# Patient Record
Sex: Female | Born: 1955
Health system: Southern US, Community
[De-identification: ages and names within clinical notes are randomized; demographics above are authoritative.]

## PROBLEM LIST (undated history)

## (undated) DIAGNOSIS — K5909 Other constipation: Secondary | ICD-10-CM

## (undated) DIAGNOSIS — F418 Other specified anxiety disorders: Secondary | ICD-10-CM

## (undated) DIAGNOSIS — I10 Essential (primary) hypertension: Secondary | ICD-10-CM

## (undated) DIAGNOSIS — K5790 Diverticulosis of intestine, part unspecified, without perforation or abscess without bleeding: Secondary | ICD-10-CM

## (undated) DIAGNOSIS — K219 Gastro-esophageal reflux disease without esophagitis: Secondary | ICD-10-CM

## (undated) DIAGNOSIS — E119 Type 2 diabetes mellitus without complications: Secondary | ICD-10-CM

## (undated) DIAGNOSIS — E663 Overweight: Secondary | ICD-10-CM

## (undated) DIAGNOSIS — K279 Peptic ulcer, site unspecified, unspecified as acute or chronic, without hemorrhage or perforation: Secondary | ICD-10-CM

## (undated) DIAGNOSIS — M069 Rheumatoid arthritis, unspecified: Secondary | ICD-10-CM

## (undated) DIAGNOSIS — G44009 Cluster headache syndrome, unspecified, not intractable: Secondary | ICD-10-CM

## (undated) DIAGNOSIS — N183 Chronic kidney disease, stage 3 unspecified: Secondary | ICD-10-CM

## (undated) DIAGNOSIS — G43909 Migraine, unspecified, not intractable, without status migrainosus: Secondary | ICD-10-CM

## (undated) DIAGNOSIS — M8580 Other specified disorders of bone density and structure, unspecified site: Secondary | ICD-10-CM

## (undated) DIAGNOSIS — K635 Polyp of colon: Secondary | ICD-10-CM

## (undated) HISTORY — DX: Diverticulosis of intestine, part unspecified, without perforation or abscess without bleeding: K57.90

## (undated) HISTORY — DX: Other specified disorders of bone density and structure, unspecified site: M85.80

## (undated) HISTORY — DX: Polyp of colon: K63.5

## (undated) HISTORY — DX: Chronic kidney disease, stage 3 unspecified: N18.30

## (undated) HISTORY — DX: Type 2 diabetes mellitus without complications: E11.9

## (undated) HISTORY — DX: Migraine, unspecified, not intractable, without status migrainosus: G43.909

## (undated) HISTORY — DX: Essential (primary) hypertension: I10

## (undated) HISTORY — DX: Overweight: E66.3

## (undated) HISTORY — DX: Other constipation: K59.09

## (undated) HISTORY — PX: ABDOMINAL HYSTERECTOMY: SHX81

## (undated) HISTORY — DX: Other specified anxiety disorders: F41.8

## (undated) HISTORY — DX: Gastro-esophageal reflux disease without esophagitis: K21.9

## (undated) HISTORY — PX: APPENDECTOMY: SHX54

## (undated) HISTORY — DX: Cluster headache syndrome, unspecified, not intractable: G44.009

## (undated) HISTORY — PX: OTHER SURGICAL HISTORY: SHX169

## (undated) HISTORY — DX: Rheumatoid arthritis, unspecified: M06.9

## (undated) HISTORY — DX: Peptic ulcer, site unspecified, unspecified as acute or chronic, without hemorrhage or perforation: K27.9

## (undated) HISTORY — PX: CHOLECYSTECTOMY: SHX55

## (undated) HISTORY — PX: BACK SURGERY: SHX140

---

## 2007-02-09 ENCOUNTER — Encounter: Admission: RE | Admit: 2007-02-09 | Discharge: 2007-02-09 | Payer: Self-pay | Admitting: Family Medicine

## 2007-03-08 ENCOUNTER — Ambulatory Visit: Payer: Self-pay | Admitting: Gastroenterology

## 2007-03-08 LAB — CONVERTED CEMR LAB
Basophils Absolute: 0 10*3/uL (ref 0.0–0.1)
Basophils Relative: 0.6 % (ref 0.0–1.0)
Eosinophils Absolute: 0.2 10*3/uL (ref 0.0–0.6)
Eosinophils Relative: 5.3 % — ABNORMAL HIGH (ref 0.0–5.0)
HCT: 33.3 % — ABNORMAL LOW (ref 36.0–46.0)
Hemoglobin: 11.2 g/dL — ABNORMAL LOW (ref 12.0–15.0)
INR: 0.9 (ref 0.8–1.0)
Lymphocytes Relative: 39.2 % (ref 12.0–46.0)
MCHC: 33.5 g/dL (ref 30.0–36.0)
MCV: 88.2 fL (ref 78.0–100.0)
Monocytes Absolute: 0.3 10*3/uL (ref 0.2–0.7)
Monocytes Relative: 6.4 % (ref 3.0–11.0)
Neutro Abs: 2 10*3/uL (ref 1.4–7.7)
Neutrophils Relative %: 48.5 % (ref 43.0–77.0)
Platelets: 331 10*3/uL (ref 150–400)
Prothrombin Time: 11.5 s (ref 10.9–13.3)
RBC: 3.78 M/uL — ABNORMAL LOW (ref 3.87–5.11)
RDW: 22.5 % — ABNORMAL HIGH (ref 11.5–14.6)
WBC: 4.1 10*3/uL — ABNORMAL LOW (ref 4.5–10.5)
aPTT: 26.1 s (ref 21.7–29.8)

## 2007-03-23 HISTORY — PX: LIVER BIOPSY: SHX301

## 2007-03-29 ENCOUNTER — Encounter: Payer: Self-pay | Admitting: Gastroenterology

## 2007-03-29 ENCOUNTER — Ambulatory Visit (HOSPITAL_COMMUNITY): Admission: RE | Admit: 2007-03-29 | Discharge: 2007-03-29 | Payer: Self-pay | Admitting: Gastroenterology

## 2007-03-29 ENCOUNTER — Encounter (INDEPENDENT_AMBULATORY_CARE_PROVIDER_SITE_OTHER): Payer: Self-pay | Admitting: *Deleted

## 2007-03-31 ENCOUNTER — Ambulatory Visit: Payer: Self-pay | Admitting: Gastroenterology

## 2007-04-05 ENCOUNTER — Ambulatory Visit: Payer: Self-pay | Admitting: Gastroenterology

## 2007-09-29 ENCOUNTER — Telehealth (INDEPENDENT_AMBULATORY_CARE_PROVIDER_SITE_OTHER): Payer: Self-pay | Admitting: *Deleted

## 2007-10-03 ENCOUNTER — Ambulatory Visit: Payer: Self-pay | Admitting: Gastroenterology

## 2007-10-03 LAB — CONVERTED CEMR LAB
ALT: 30 units/L (ref 0–35)
AST: 26 units/L (ref 0–37)
Albumin: 3.4 g/dL — ABNORMAL LOW (ref 3.5–5.2)
Alkaline Phosphatase: 103 units/L (ref 39–117)
Bilirubin, Direct: 0.1 mg/dL (ref 0.0–0.3)
HCV Ab: NEGATIVE
Hep B S Ab: POSITIVE — AB
Hepatitis B Surface Ag: NEGATIVE
Total Bilirubin: 0.6 mg/dL (ref 0.3–1.2)
Total Protein: 6.8 g/dL (ref 6.0–8.3)

## 2007-10-05 ENCOUNTER — Telehealth: Payer: Self-pay | Admitting: Gastroenterology

## 2007-10-31 ENCOUNTER — Emergency Department (HOSPITAL_COMMUNITY): Admission: EM | Admit: 2007-10-31 | Discharge: 2007-11-01 | Payer: Self-pay | Admitting: Emergency Medicine

## 2008-02-19 ENCOUNTER — Encounter: Payer: Self-pay | Admitting: Gastroenterology

## 2008-02-20 ENCOUNTER — Telehealth: Payer: Self-pay | Admitting: Gastroenterology

## 2008-02-22 ENCOUNTER — Ambulatory Visit: Payer: Self-pay | Admitting: Gastroenterology

## 2008-02-22 DIAGNOSIS — I10 Essential (primary) hypertension: Secondary | ICD-10-CM | POA: Insufficient documentation

## 2008-02-22 DIAGNOSIS — K589 Irritable bowel syndrome without diarrhea: Secondary | ICD-10-CM | POA: Insufficient documentation

## 2008-02-22 DIAGNOSIS — K625 Hemorrhage of anus and rectum: Secondary | ICD-10-CM | POA: Insufficient documentation

## 2008-02-22 DIAGNOSIS — M069 Rheumatoid arthritis, unspecified: Secondary | ICD-10-CM | POA: Insufficient documentation

## 2008-02-22 DIAGNOSIS — F418 Other specified anxiety disorders: Secondary | ICD-10-CM | POA: Insufficient documentation

## 2008-02-22 DIAGNOSIS — R1013 Epigastric pain: Secondary | ICD-10-CM | POA: Insufficient documentation

## 2008-02-22 HISTORY — DX: Other specified anxiety disorders: F41.8

## 2008-04-02 ENCOUNTER — Ambulatory Visit: Payer: Self-pay | Admitting: Gastroenterology

## 2008-04-23 ENCOUNTER — Ambulatory Visit: Payer: Self-pay | Admitting: Gastroenterology

## 2008-04-23 ENCOUNTER — Encounter: Payer: Self-pay | Admitting: Gastroenterology

## 2008-04-23 DIAGNOSIS — K279 Peptic ulcer, site unspecified, unspecified as acute or chronic, without hemorrhage or perforation: Secondary | ICD-10-CM

## 2008-04-23 HISTORY — PX: COLONOSCOPY: SHX174

## 2008-04-23 HISTORY — DX: Peptic ulcer, site unspecified, unspecified as acute or chronic, without hemorrhage or perforation: K27.9

## 2008-04-24 ENCOUNTER — Telehealth: Payer: Self-pay | Admitting: Gastroenterology

## 2008-04-30 ENCOUNTER — Encounter: Payer: Self-pay | Admitting: Gastroenterology

## 2008-06-12 ENCOUNTER — Telehealth: Payer: Self-pay | Admitting: Gastroenterology

## 2008-08-02 ENCOUNTER — Telehealth: Payer: Self-pay | Admitting: Gastroenterology

## 2010-04-21 NOTE — Progress Notes (Signed)
Summary: REctal Pain  Phone Note Call from Patient Call back at [phone] cell   Call For: DR Arlyce Dice Reason for Call: Talk to Nurse Summary of Call: Rectal bleeding and pain. Wants to get scheduled for [facility] asap Initial call taken by: Leanor Kail [facility],  February 20, 2008 11:42 AM  Follow-up for Phone Call        Spoke with patient c/o rectal pain with bleeding,  she has hx of IBS, constipation with diarrhea.  Does have a history of hemrroids also did have some banding done also.  Patient also wanted to set up appointment for EGD and Colonoscopy. Pt given appointment on 02/22/08 at 3pm.  Chart requested send to Zella Ball, instructed to bring insurance card copay and updated list of medications.  Will call with any more issues or concerns. Follow-up by: Paulene Floor, RN,  February 20, 2008 11:59 AM

## 2010-04-21 NOTE — Procedures (Signed)
Summary: EGD   EGD  Procedure date:  04/23/2008  Findings:      Location: [facility]    ENDOSCOPY PROCEDURE REPORT  PATIENT:  Emma [name], Emma [name]  MR#:  [id number] BIRTHDATE:   [birth year]   GENDER:   female  ENDOSCOPIST:   Barbette Hair. Arlyce Dice, MD Referred by: Herb Grays, M.D.  PROCEDURE DATE:  04/23/2008 PROCEDURE:  EGD with biopsy ASA CLASS:   Class II INDICATIONS: abdominal pain   MEDICATIONS:    There was residual sedation effect present from prior procedure., Fentanyl 25 mcg, Versed 1 mg, glycopyrrolate (Robinal) 0.2, 0.6cc simethancone 0.6 cc PO TOPICAL ANESTHETIC:   Exactacain Spray  DESCRIPTION OF PROCEDURE:   After the risks benefits and alternatives of the procedure were thoroughly explained, informed consent was obtained.  The LB GIF-H180 [id number] endoscope was introduced through the mouth and advanced to the third portion of the duodenum, without limitations.  The instrument was slowly withdrawn as the mucosa was fully examined. <<PROCEDUREIMAGES>>  <<OLD IMAGES>>  Multiple ulcers were found in the antrum. 3 discreet 3-86mm prepyloric ulcers with clean base (see image3 and image4). Bxs taken  Otherwise the examination was normal.    Retroflexed views revealed no abnormalities.    The scope was then withdrawn from the patient and the procedure completed.  COMPLICATIONS:   None  ENDOSCOPIC IMPRESSION:  1) Ulcers, multiple in the antrum  2) Otherwise normal examination RECOMMENDATIONS:  1) Protonix 40 mg qd  2) follow-up: office 1 month(s)  REPEAT EXAM:   No   _______________________________ Barbette Hair. Arlyce Dice, MD    CC: Herb Grays, MD     REPORT OF SURGICAL PATHOLOGY   Case #: [id number] Patient Name: Emma [name] Office Chart Number:  [id number]   MRN: [id number] Pathologist: Alden Server A. Delila Spence, MD DOB/Age  12/18/1955 (Age: 55)    Gender: F Date Taken:  04/23/2008 Date Received: 04/24/2008   FINAL DIAGNOSIS   ***MICROSCOPIC EXAMINATION AND  DIAGNOSIS***   STOMACH:  BENIGN GASTRIC MUCOSA.  NO HELICOBACTER PYLORI, INTESTINAL METAPLASIA OR ACTIVE INFLAMMATION IDENTIFIED.   COMMENT A Warthin-Starry stain is performed to determine the possibility of the presence of Helicobacter pylori. The Warthin-Starry stain is negative for organisms of Helicobacter pylori. The control(s) stained appropriately. (EAA:mj 04/25/08)   mj Date Reported:  04/25/2008     Alden Server A. Delila Spence, MD *** Electronically Signed Out By EAA ***   April 30, 2008 MRN: [id number]  RONNELL MAKAREWICZ [address] [place], [place]  [address]  Dear Emma [name],  I am pleased to inform you that the biopsies taken during your recent endoscopic examination did not show any evidence of cancer upon pathologic examination.  Additional information/recommendations:  __No further action is needed at this time.  Please follow-up with      your primary care physician for your other healthcare needs.  __ Please call [phone] to schedule a return visit to review      your condition.  __x Continue with the treatment plan as outlined on the day of your      exam.  __ You should have a repeat endoscopic examination for this problem              in _ months/years.   Please call Emma [name] if you are having persistent problems or have questions about your condition that have not been fully answered at this time.  Sincerely,  Louis Meckel MD  This letter has been electronically signed by your physician.  This  report was created from the original endoscopy report, which was reviewed and signed by the above listed endoscopist.

## 2010-04-21 NOTE — Procedures (Signed)
Summary: Colonoscopy   Colonoscopy  Procedure date:  04/23/2008  Findings:      Location:  [facility].    Procedures Next Due Date:    Colonoscopy: 04/2018  COLONOSCOPY PROCEDURE REPORT  PATIENT:  Emma [name], Emma [name]  MR#:  [id number] BIRTHDATE:   August 26, 1955   GENDER:   female  ENDOSCOPIST:   Barbette Hair. Arlyce Dice, MD Referred by: Herb Grays, M.D.  PROCEDURE DATE:  04/23/2008 PROCEDURE:  Colonoscopy, diagnostic ASA CLASS:   Class II INDICATIONS: rectal bleeding   MEDICATIONS:    Fentanyl 75 mcg, Versed 7 mg, benadryl 25  DESCRIPTION OF PROCEDURE:   After the risks benefits and alternatives of the procedure were thoroughly explained, informed consent was obtained.  Digital rectal exam was performed and revealed no abnormalities.   The LB-CF-[id number] endoscope was introduced through the anus and advanced to the cecum, limited by fair prep.  There was a small amount of retained, solid stool in the right colon  The quality of the prep was good.  The instrument was then slowly withdrawn as the colon was fully examined. <<PROCEDUREIMAGES>>    <<OLD IMAGES>>  FINDINGS:  Mild diverticulosis was found in the sigmoid colon (see image5).  Internal hemorrhoids were found (see image6).  This was otherwise a normal examination (see image3).   Retroflexed views in the rectum revealed no abnormalities.    The scope was then withdrawn from the patient and the procedure completed.  COMPLICATIONS:   None  ENDOSCOPIC IMPRESSION:  1) Mild diverticulosis in the sigmoid colon  2) Internal hemorrhoids  3) Otherwise normal examination  Rectal bleeding secondary to hemorrhoids RECOMMENDATIONS:  1) Anusol HC supp. prn  REPEAT EXAM:   In 10 year(s) for Colonoscopy.   _______________________________ Barbette Hair. Arlyce Dice, MD  CC: Herb Grays, MD

## 2010-04-21 NOTE — Progress Notes (Signed)
Summary: REV scheduled  Phone Note Outgoing Call   Call placed by: Laureen Ochs LPN,  April 24, 2008 10:30 AM Call placed to: Patient Summary of Call: Pt. scheduled for an REV appointment w/Dr.Johnwesley Lederman on 05-22-08 at 2:15pm. Pt. instructed to call back as needed.  Initial call taken by: Laureen Ochs LPN,  April 24, 2008 10:38 AM

## 2010-04-21 NOTE — Progress Notes (Signed)
Summary: LAB RESULTS  Phone Note Call from Patient Call back at Home Phone [phone]   Caller: Patient Call For: Shany Marinez Reason for Call: Talk to Nurse, Lab or Test Results Summary of Call: Would like LAB results Initial call taken by: Guadlupe Spanish [facility],  October 05, 2007 4:57 PM  Follow-up for Phone Call        Mesage left for patient to callback. Laureen Ochs LPN  October 06, 2007 12:13 PM  Message left-labs WNL. Pt. instructed to call back as needed.  Follow-up by: Laureen Ochs LPN,  October 09, 2007 10:08 AM

## 2010-04-21 NOTE — Assessment & Plan Note (Signed)
Summary: rectal pain bleeding/rb   History of Present Illness Visit Type: follow up Primary GI MD: Melvia Heaps MD [facility] Primary Provider: [facility] Chief Complaint: rectal pain and bleeding History of Present Illness:   [name] is  here  for evaluation of abdominal pain and rectal bleeding.  She has a history of rheumatoid arthritis and was seen earlier this year for liver biopsy in conjunction with methotrexate therapy.  Grade 2 fibrosis and inflammatory changes were seen and  her methotrexate was discontinued.  Over the past 2-3 years she has been complaining of upper abdominal discomfort which is worse over the past few weeks.  She experiences postprandial fullness, bloating and generalized discomfort.  Endoscopy several years ago apparently was pertinent for retained food.  She has frequent pyrosis despite taking omeprazole.  In addition, bowels are very erratic.  She has been experiencing hematochezia consisting of bright red blood mixed with the stools.  She has had small amounts of bowel movements consisting only of blood.  She complains of rectal discomfort.  She probably underwent colonoscopy about 3 years ago we are anal fissures were demonstrated.  She has undergone hemorrhoidal surgery twice in the past.    GI Review of Systems    Reports abdominal pain.     Location of  Abdominal pain: upper abdomen.    Denies acid reflux, belching, bloating, chest pain, dysphagia with liquids, dysphagia with solids, heartburn, loss of appetite, nausea, vomiting, vomiting blood, weight loss, and  weight gain.      Reports constipation, diarrhea, rectal bleeding, and  rectal pain.     Denies anal fissure, black tarry stools, change in bowel habit, diverticulosis, fecal incontinence, heme positive stool, hemorrhoids, irritable bowel syndrome, jaundice, light color stool, and  liver problems.     Prior Medications Reviewed Using: Patient Recall  Updated Prior Medication List: CYMBALTA 60 MG  CPEP (DULOXETINE HCL) 1 once daily HYDROCHLOROTHIAZIDE 25 MG TABS (HYDROCHLOROTHIAZIDE) 1/2 tablet by mouth once daily LEFLUNOMIDE 20 MG TABS (LEFLUNOMIDE) 1 once daily LEXAPRO 20 MG TABS (ESCITALOPRAM OXALATE) 1 once daily LISINOPRIL 20 MG TABS (LISINOPRIL) 1 once daily OMEPRAZOLE 20 MG CPDR (OMEPRAZOLE) one tablet by mouth once daily PLAQUENIL 200 MG TABS (HYDROXYCHLOROQUINE SULFATE) 1 two times a day ALPRAZOLAM 0.5 MG TBDP (ALPRAZOLAM) 1 two times a day as needed CARISOPRODOL 350 MG TABS (CARISOPRODOL) 1-2 at bedtime FEXOFENADINE HCL 180 MG TABS (FEXOFENADINE HCL) 1 once daily as needed DARVOCET-N 100 100-650 MG TABS (PROPOXYPHENE N-APAP) as needed  Current Allergies (reviewed today): ! CODEINE ! * BEE STINGS  Past Medical History:    Reviewed history from 02/22/2008 and no changes required:       Current Problems:        DEPRESSION (ICD-311)       RHEUMATOID ARTHRITIS (ICD-714.0)       HYPERTENSION (ICD-401.9)         Past Surgical History:    Reviewed history from 02/22/2008 and no changes required:       Appendectomy       Cholecystectomy       Hemorrhoidectomy       Hysterectomy   Family History:    Reviewed history and no changes required:       Family History of Breast Cancer:breast       Family History of Pancreatic Cancer:father       father-skin cancer       Family History of Colon Cancer:  Social History:    Reviewed history  and no changes required:       Patient has never smoked.        Alcohol Use - no       Daily Caffeine Use   Risk Factors:  Tobacco use:  never Alcohol use:  no   Review of Systems       The patient complains of arthritis/joint pain and sleeping problems.         With the exception of the above stipulations, review of systems is otherwise negative    Vital Signs:  Patient Profile:   55 Years Old Female Height:     68 inches Weight:      230 pounds BMI:     35.10 BSA:     2.17 Pulse rate:   84 / minute Pulse rhythm:    regular BP sitting:   150 / 92  (left arm)  Vitals Entered By: Milford Cage CMA (February 22, 2008 2:51 PM)                  Physical Exam  She is a well-developed well-nourished female  skin: anicteric HEENT: normocephalic; PEERLA; no nasal or pharyngeal abnormalities neck: supple nodes: no cervical lymphadenopathy chest: clear to ausculatation and percussion heart: no murmurs, gallops, or rubs abd: soft, nontender; BS normoactive; no abdominal masses, tenderness, organomegaly rectal: deferred ext: no cynanosis, clubbing, edema skeletal: no deformities neuro: oriented x 3; no focal abnormalities     Impression & Recommendations:  Problem # 1:  ABDOMINAL PAIN, EPIGASTRIC (ICD-789.06) Pain could be due to ulcer or nonulcer dyspepsia.  Gastroparesis is also a concern.  Recommendations #1 continue omeprazole #2 upper endoscopy #3 to consider gastric imaging scan if endoscopy is not diagnostic  Risks, alternatives, and complications of the procedure, including bleeding, perforation, and possible need for surgery, were explained to the patient.  Patient's questions were answered.  Orders: EFL (Endo/Flex)   Problem # 2:  HEMORRHAGE OF RECTUM AND ANUS (ICD-569.3) Patient most likely is bleeding from hemorrhoids or possibly an anal fissure  Recommendations #1 ProctoFoam  (patient refuses to use a suy and ) #2 fiber supplementation #3 sigmoidoscopy-to be done at the same time as upper endoscopy Orders: EFL (Endo/Flex)    Patient Instructions: 1)  Warm soaks daily 2)  Stool softeners as needed  3)  Avoid spicy foods  4)  CC Dr. Collins Scotland 5)  ProctoFoam  (patient refuses to use a suy and ) 6)  fiber supplementation 7)  sigmoidoscopy-to be done at the same time as upper endoscopy 8)  continue omeprazole    Prescriptions: PROCTOFOAM HC 1-1 % FOAM (HYDROCORTISONE ACE-PRAMOXINE) Applied per rectum before retiring  #7 x 2   Entered and Authorized by:   Louis Meckel MD   Signed by:   Louis Meckel MD on 02/22/2008   Method used:   Electronically to        [facility]* (retail)       [address]       [place], [place]  [id number]       Ph: [phone]       Fax: [phone]   RxID:   [id number]  ]

## 2010-04-21 NOTE — Progress Notes (Signed)
Summary: talk to nurse  Phone Note Call from Patient Call back at Home Phone [phone]   Call For: Dr Arlyce Dice Reason for Call: Talk to Nurse Summary of Call: Wants to know her Colon diagnosis Initial call taken by: Leanor Kail [facility],  Aug 02, 2008 2:42 PM  Follow-up for Phone Call        Spoke with patient. She was asking a lot of questions regarding her procedures and diagnosis .Pt states she is seeing a neurologist and needed to know what to tell him. Pt will call back to get records or if she has further questions Follow-up by: Ulis Rias RN,  Aug 02, 2008 2:49 PM

## 2010-04-21 NOTE — Miscellaneous (Signed)
Summary: Gi PV  Clinical Lists Changes  Observations: Added new observation of ALLERGY REV: Done (04/02/2008 15:55) Pt states she had been signed in since 1540.  However, I was not notified on the computer screen or on my pager that she was here.  She seemed bothered that she had been waiting.  Her appt was scheduled for 1630.  I called her in for her PV at 1630.

## 2010-04-21 NOTE — Procedures (Signed)
Summary: Liver Biopsy   Liver Biopsy  Procedure date:  03/29/2007  Findings:      normal:   Patient Name: Emma [name], Emma [name] MRN: [id number] Procedure Procedures: Percutaneous Liver BiopsyCPT: 47000.  Personnel: Examiner: Barbette Hair. Arlyce Dice, MD.  Indications  Evaluation of: ABnormal LFTs.  History  Current Medications: Patient is not currently taking Coumadin.  Pre-Exam Physical: Performed Mar 29, 2007. Entire physical exam was normal.  This exam is being done to assess current degree of liver injury.  Exam Patient: ASA Classification: II. Tolerance: excellent.  Monitoring: BP and pulse monitoring done. Oximetry used. Vital signs were monitored every 15 mins. time four , then vital signs every 30 mins time two , then vital signs hourly until discharge.  Sedation Meds: Patient assessed and found to be appropriate for moderate (conscious) sedation. Sedation was managed by the Endoscopist. Fentanyl 50 mcg. given IV. Versed 2 mg. given IV.  Fluoroscopy: Fluoroscopy was not used.  Positioning: Patient in supine position  Prep: The site was prepped with Betadine Swabs, using 2 swabs. Sterile drapes were placed. Local anesthesia with 1% xylocaine. A total of 1 passes were made with a Trucut/Gun needle,  Exam Completion: A bandage was placed over procedure site. Patient placed on right side for 60 minutes.  Results  Liver Biopsy Results: Core material was obtained,  length 2 cms  Assessment Normal examination. The procedure was successful.  Events  Unplanned Intervention: No intervention was required.  Unplanned Events: There were no complications. Plans Medication(s): Await pathology.  Patient Education: Patient given standard instructions for: Liver Biopsy.  Scheduling: Office Visit, to [employer]. Arlyce Dice, MD, around Apr 12, 2007.    This report was created from the original endoscopy report, which was reviewed and signed by the above listed endoscopist.    cc.  Tammy Spear,MD     Lottie Rater

## 2010-04-21 NOTE — Letter (Signed)
Summary: Patient [facility] Biopsy Results  [facility]  [address] [place], [place] [address]   Phone: [phone]  Fax: [phone]        April 30, 2008 MRN: [id number]    Emma [name] [address] [place], [place]  [address]    Dear Ms. Beckner,  I am pleased to inform you that the biopsies taken during your recent endoscopic examination did not show any evidence of cancer upon pathologic examination.  Additional information/recommendations:  __No further action is needed at this time.  Please follow-up with      your primary care physician for your other healthcare needs.  __ Please call [phone] to schedule a return visit to review      your condition.  __x Continue with the treatment plan as outlined on the day of your      exam.  __ You should have a repeat endoscopic examination for this problem              in _ months/years.   Please call us if you are having persistent problems or have questions about your condition that have not been fully answered at this time.  Sincerely,  Louis Meckel MD  This letter has been electronically signed by your physician.

## 2010-08-04 NOTE — Assessment & Plan Note (Signed)
[facility]                         GASTROENTEROLOGY OFFICE NOTE   COPELYN, WIDMER                         MRN:          [id number]  DATE:03/08/2007                            DOB:          03-Jun-1955    REASON FOR CONSULTATION:  Abnormal ultrasound.   Emma [name] is a 55 year old white female referred through the courtesy  of Dr. Collins Scotland for evaluation. She has rheumatoid arthritis and complains  of chronic fatigue. An abdominal ultrasound was done on November 20th.  This demonstrated a grainy appearance of the hepatic echotexture and  slightly increased echogenicity raising the question of hepatocellular  disease. Liver tests are normal. Emma [name] has no GI complaints  including abdominal pain, changes in bowel habits, melena or  hematochezia. She takes methotrexate 20 mg weekly. She has been on this  for at least five years. She has no history of hepatitis or jaundice.  She drinks minimally. She apparently was diagnosed with a severe anemia  several years ago requiring transfusions. She claims to have had three  colonoscopies in the last three years.   PAST MEDICAL HISTORY:  1. Hypertension.  2. Rheumatoid arthritis.  3. Status post cholecystectomy.  4. Status post hemorrhoidectomy.  5. Status post hysterectomy.  6. Status post appendectomy.   FAMILY HISTORY:  Pertinent for mother with breast cancer.   MEDICATIONS:  1. Cymbalta.  2. Hydrochlorothiazide.  3. Leflunomide.  4. Lexapro.  5. Lisinopril.  6. Methotrexate.  7. Omeprazole.  8. Plaquenil.  9. Orencia infusion every month.   ALLERGIES:  None.   She does not smoke. She is married and is on disability.   REVIEW OF SYSTEMS:  Was reviewed and was negative.   PHYSICAL EXAMINATION:  Pulse 84, blood pressure 172/94, weight 213.  There is no stigmata of liver disease.  HEENT: EOMI.  PERRLA.  Sclerae are anicteric.  Conjunctivae are pink.  NECK:  Supple without thyromegaly,  adenopathy or carotid bruits.  CHEST:  Clear to auscultation and percussion without adventitious  sounds.  CARDIAC:  Regular rhythm; normal S1 S2.  There are no murmurs, gallops  or rubs.  ABDOMEN:  Bowel sounds are normoactive.  Abdomen is soft, nontender and  nondistended.  There are no abdominal masses, tenderness, splenic  enlargement or hepatomegaly.  EXTREMITIES:  Full range of motion.  No cyanosis, clubbing or edema.  RECTAL:  There are no masses.  Stool is Hemoccult negative.   On February 08, 2007, hemoglobin was 10, hematocrit 31, MCV 83.   IMPRESSION:  1. Questionable hepatocellular disease. The patient certainly is at      increased risk for fibrosis in view of her therapy with      methotrexate. She has had several grams of methotrexate judging by      her current dose.  2. Fatigue, probably related to rheumatoid arthritis.  3. Anemia of chronic disease.   RECOMMENDATIONS:  Percutaneous liver biopsy.     Barbette Hair. Arlyce Dice, MD,FACG  Electronically Signed    RDK/MedQ  DD: 03/08/2007  DT: 03/08/2007  Job #: [id number]   cc:   Tammy  Wendi Snipes, M.D.  Caryn Section, MD

## 2010-08-04 NOTE — Letter (Signed)
March 08, 2007    Tammy R. Collins Scotland, M.D.  [address],  [place] [address]   RE:  Emma [name], Emma [name]  MRN:  [id number]  /  DOB:  [birth year]   Dear Dr. Collins Scotland:   Upon your kind referral, I had the pleasure of evaluating your patient  and I am pleased to offer my findings.  I saw Ms. Haily Caley in the  office today.  Enclosed is a copy of my progress note that details my  findings and recommendations.   Thank you for the opportunity to participate in your patient's care.    Sincerely,      Barbette Hair. Arlyce Dice, MD,FACG  Electronically Signed    RDK/MedQ  DD: 03/08/2007  DT: 03/08/2007  Job #: [id number]

## 2010-08-04 NOTE — Assessment & Plan Note (Signed)
[facility]                         GASTROENTEROLOGY OFFICE NOTE   Emma [name], Emma [name]                         MRN:          [id number]  DATE:04/05/2007                            DOB:          [birth year]    NOTATION:  Please send a copy of the liver biopsy result to both Dr.  Collins Scotland and to Dr. Rogelio Seen.   PROBLEM:  Chronic hepatitis.   HISTORY:  Emma [name] has returned following a percutaneous liver biopsy.  The biopsy demonstrated grade 2 chronic hepatitis with stage 2 fibrosis.  There was mild chronic portal inflammatory with areas of piecemeal  necrosis and rare areas of bridging fibrosis.  The changes are  consistent with methotrexate therapy.   On the basis of the liver biopsy results, I recommend that Emma [name]  discontinue further methotrexate therapy, since it has demonstrated  liver hepatotoxicity.  Fortunately, these changes at this point should  not impair her hepatic function.     Barbette Hair. Arlyce Dice, MD,FACG  Electronically Signed    RDK/MedQ  DD: 04/05/2007  DT: 04/05/2007  Job #: [id number]   cc:   Tammy R. Collins Scotland, M.D.  Caryn Section, M.D.

## 2010-08-04 NOTE — Letter (Signed)
March 08, 2007    Ms. Emma [name]   RE:  [name], [name]  MRN:  [id number]  /  DOB:  06-04-1955   Dear Ms. Goecke:   It is my pleasure to have treated you recently as a new patient in my  office.  I appreciate your confidence and the opportunity to participate  in your care.   Since I do have a busy inpatient endoscopy schedule and office schedule,  my office hours vary weekly.  I am, however, available for emergency  calls every day through my office.  If I cannot promptly meet an urgent  office appointment, another one of our gastroenterologists will be able  to assist you.   My well-trained staff are prepared to help you at all times.  For  emergencies after office hours, a physician from our gastroenterology  section is always available through my 24-hour answering service.   While you are under my care, I encourage discussion of your questions  and concerns, and I will be happy to return your calls as soon as I am  available.   Once again, I welcome you as a new patient and I look forward to a happy  and healthy relationship.    Sincerely,      Barbette Hair. Arlyce Dice, MD,FACG  Electronically Signed   RDK/MedQ  DD: 03/08/2007  DT: 03/08/2007  Job #: [id number]

## 2011-06-10 ENCOUNTER — Other Ambulatory Visit: Payer: Self-pay | Admitting: Neurology

## 2011-06-10 DIAGNOSIS — R519 Headache, unspecified: Secondary | ICD-10-CM

## 2011-06-11 ENCOUNTER — Encounter: Payer: Self-pay | Admitting: Family Medicine

## 2011-06-11 ENCOUNTER — Ambulatory Visit (INDEPENDENT_AMBULATORY_CARE_PROVIDER_SITE_OTHER): Payer: Medicare Other | Admitting: Family Medicine

## 2011-06-11 DIAGNOSIS — K219 Gastro-esophageal reflux disease without esophagitis: Secondary | ICD-10-CM

## 2011-06-11 DIAGNOSIS — IMO0001 Reserved for inherently not codable concepts without codable children: Secondary | ICD-10-CM | POA: Insufficient documentation

## 2011-06-11 DIAGNOSIS — K5900 Constipation, unspecified: Secondary | ICD-10-CM | POA: Insufficient documentation

## 2011-06-11 DIAGNOSIS — D126 Benign neoplasm of colon, unspecified: Secondary | ICD-10-CM

## 2011-06-11 DIAGNOSIS — G43909 Migraine, unspecified, not intractable, without status migrainosus: Secondary | ICD-10-CM

## 2011-06-11 DIAGNOSIS — M069 Rheumatoid arthritis, unspecified: Secondary | ICD-10-CM

## 2011-06-11 DIAGNOSIS — F341 Dysthymic disorder: Secondary | ICD-10-CM

## 2011-06-11 DIAGNOSIS — R079 Chest pain, unspecified: Secondary | ICD-10-CM | POA: Insufficient documentation

## 2011-06-11 DIAGNOSIS — K625 Hemorrhage of anus and rectum: Secondary | ICD-10-CM

## 2011-06-11 DIAGNOSIS — E663 Overweight: Secondary | ICD-10-CM | POA: Insufficient documentation

## 2011-06-11 DIAGNOSIS — T782XXA Anaphylactic shock, unspecified, initial encounter: Secondary | ICD-10-CM

## 2011-06-11 DIAGNOSIS — I10 Essential (primary) hypertension: Secondary | ICD-10-CM

## 2011-06-11 DIAGNOSIS — K635 Polyp of colon: Secondary | ICD-10-CM

## 2011-06-11 DIAGNOSIS — F418 Other specified anxiety disorders: Secondary | ICD-10-CM

## 2011-06-11 HISTORY — DX: Polyp of colon: K63.5

## 2011-06-11 HISTORY — DX: Overweight: E66.3

## 2011-06-11 HISTORY — DX: Gastro-esophageal reflux disease without esophagitis: K21.9

## 2011-06-11 MED ORDER — ABATACEPT 125 MG/ML ~~LOC~~ SOSY: 125.0000 mg | PREFILLED_SYRINGE | SUBCUTANEOUS | Status: DC

## 2011-06-11 MED ORDER — NITROGLYCERIN 0.4 MG SL SUBL: 0.4000 mg | SUBLINGUAL_TABLET | SUBLINGUAL | Status: AC | PRN

## 2011-06-11 MED ORDER — EPINEPHRINE 0.3 MG/0.3ML IJ DEVI: 0.3000 mg | Freq: Once | INTRAMUSCULAR | Status: DC

## 2011-06-11 MED ORDER — ASPIRIN 81 MG PO TBEC: 81.0000 mg | DELAYED_RELEASE_TABLET | Freq: Every day | ORAL | Status: AC

## 2011-06-11 MED ORDER — TOPIRAMATE 50 MG PO TABS: 100.0000 mg | ORAL_TABLET | Freq: Two times a day (BID) | ORAL | Status: DC

## 2011-06-11 NOTE — Progress Notes (Signed)
Patient ID: Emma [name], female   DOB: 12/29/1955, 56 y.o.   MRN: [id number] Royann Wildasin [id number] 26-Jan-1956 06/11/2011      Progress Note New Patient  Subjective  Chief Complaint  Chief Complaint  Patient presents with  . Establish Care    noted fatigue per chronic case of Rhuematoid Arthritis for 13 years-  . Shortness of Breath    noted past 3-4 months as well as heaviness/tightness in  throat  . Headache    notes MRI scheduled from neurologist this upcoming tuesday     HPI  Patient is a 56 year old Caucasian female in today for urgent new patient appt. She reports she had been considering switching PMDs and then recently began having more frequent episodes of chest pain. She reported the last several months having a lot of trouble controlling her blood pressure. She reports both of her doctor's office and at home she's been having blood pressure readings of 200/100. Her primary care doctor has started lisinopril and hydrochlorothiazide. She denies recently neurology she saw a 90s over 60s but she did not feel lightheaded or poorly at that time. She is describing episodes of chest pain occurring roughly every other day. She describes them as chest pressure radiating to the neck and up into the left jaw. She says she feels short of breath nauseous sweaty and feels palpitations at the same time the chest pains occur the pains last a significant amount of time. Sometimes as much as 2 hours. She reports as showing a blood pressure medication she does feel like the frequency of the chest pains in the intensity of the chest pain has lessened somewhat. She does note some intermittent episodes of epistaxis that occurred also when she's had high blood pressure readings. She has ongoing trouble with headaches and does follow with [facility] neurology, Dr. Pearlean Brownie for that. These have not worsened recently.  . She notes she decreased her activity level significantly in the last couple of months trying to  compensate for her pain. She reports episodes can occur at any time. She can be active or resting and can even awaken her at night. She initially denies anxiety but then does acknowledge that she does have a lot of tension and irritability. She has been using alprazolam when necessary with some effect. She denies ever having had a cardiac workup. She reports an episode recently while in a store where she felt severe chest pain, fatigue, palpitations, shortness of breath diaphoresis, nausea and radiation of pain which lasted for quite some time. When asked why she did not present to the ER she was noncommittal but acknowledges she just does not like emergency records.  She has a long history of rheumatoid rhinitis dating back roughly 14 years and has multiple medications on the list for that. She has 2 different steroids she reports she takes alternately. She also has Norco and meloxicam she uses intermittently. She avoids Norco but it does lead to constipation hemorrhoids and bleeding but on occasion muscle breakdown. She uses Maxalt for migraines. She is getting monthly infusions of RNC. Takes Arava as well as hydroxychloroquine for her rheumatoid. She has equal to light on her medications but understands not to take that with meloxicam. She takes Topamax twice daily for her migraines and has recently had her neurologist increasing to 2 tablets twice a day. That occurred just this week and she has not noted a great improvement in her headaches  Past Medical History  Diagnosis Date  .  Rheumatoid arthritis   . Chest pain 06/11/2011  . Overweight 06/11/2011  . Depression with anxiety 02/22/2008    Qualifier: Diagnosis of  By: Creta Levin CMA (AAMA), Robin    . Reflux 06/11/2011  . Colon polyp 06/11/2011  . Constipation 06/11/2011    History reviewed. No pertinent past surgical history.  Family History  Problem Relation Age of Onset  . Diabetes Mother   . Stroke Mother   . Arthritis Father   .  Hypertension Father   . Alcohol abuse Sister   . Cancer Sister     mesothelioma  . Heart disease Sister     cardiomegaly  . Hypertension Sister   . Hypertension Brother   . Polycystic ovary syndrome Daughter   . Heart disease Maternal Grandmother   . Heart disease Maternal Grandfather     History   Social History  . Marital Status: Married    Spouse Name: N/A    Number of Children: N/A  . Years of Education: N/A   Occupational History  . Not on file.   Social History Main Topics  . Smoking status: Never Smoker   . Smokeless tobacco: Not on file  . Alcohol Use: No  . Drug Use: No  . Sexually Active: Yes   Other Topics Concern  . Not on file   Social History Narrative  . No narrative on file    No current outpatient prescriptions on file prior to visit.    Allergies  Allergen Reactions  . Bee Venom Anaphylaxis  . Codeine     REACTION: vomiting    Review of Systems  Review of Systems  Constitutional: Positive for malaise/fatigue. Negative for fever and chills.  HENT: Negative for hearing loss, nosebleeds and congestion.   Eyes: Negative for discharge.  Respiratory: Positive for shortness of breath. Negative for cough, sputum production and wheezing.   Cardiovascular: Positive for chest pain and palpitations. Negative for leg swelling.  Gastrointestinal: Positive for heartburn, nausea and abdominal pain. Negative for vomiting, diarrhea, constipation and blood in stool.  Genitourinary: Negative for dysuria, urgency, frequency and hematuria.  Musculoskeletal: Negative for myalgias, back pain and falls.  Skin: Negative for rash.  Neurological: Negative for dizziness, tremors, sensory change, focal weakness, loss of consciousness, weakness and headaches.  Endo/Heme/Allergies: Negative for polydipsia. Does not bruise/bleed easily.  Psychiatric/Behavioral: Negative for depression and suicidal ideas. The patient is nervous/anxious. The patient does not have  insomnia.     Objective  BP 122/82  Pulse 97  Temp(Src) 97.8 F (36.6 C) (Oral)  Ht 5\' 7"  (1.702 m)  Wt 219 lb 6.4 oz (99.519 kg)  BMI 34.36 kg/m2  SpO2 98%  Physical Exam  Physical Exam  Constitutional: She is oriented to person, place, and time and well-developed, well-nourished, and in no distress. No distress.  HENT:  Head: Normocephalic and atraumatic.  Right Ear: External ear normal.  Left Ear: External ear normal.  Nose: Nose normal.  Mouth/Throat: Oropharynx is clear and moist. No oropharyngeal exudate.  Eyes: Conjunctivae are normal. Pupils are equal, round, and reactive to light. Right eye exhibits no discharge. Left eye exhibits no discharge. No scleral icterus.  Neck: Normal range of motion. Neck supple. No thyromegaly present.  Cardiovascular: Normal rate, regular rhythm, normal heart sounds and intact distal pulses.   No murmur heard.      Lower extremities mild varicosities. No inflammation  Pulmonary/Chest: Effort normal and breath sounds normal. No respiratory distress. She has no wheezes. She has no rales.  Abdominal: Soft. Bowel sounds are normal. She exhibits no distension and no mass. There is no tenderness.  Musculoskeletal: Normal range of motion. She exhibits no edema and no tenderness.  Lymphadenopathy:    She has no cervical adenopathy.  Neurological: She is alert and oriented to person, place, and time. She has normal reflexes. No cranial nerve deficit. Coordination normal.  Skin: Skin is warm and dry. No rash noted. She is not diaphoretic.  Psychiatric: Mood, memory and affect normal.       Assessment & Plan  HEMORRHAGE OF RECTUM AND ANUS S/p 2 hemorrhoidectomies, no recent difficulties  Reflux Uses Omeprazole prn, reports she has had an upper endoscopy and colonoscopy in past couple of years but her gastroenterologist has retired, will need referral in the future.  Colon polyp Benign per patient will get old records  HYPERTENSION Well  controlled today but patient reporting numbers of 200/100 and higher in the past couple of months but then had a low reading of 90s /60s at her neurologists the other day. Will not change meds based on her numbers today  Depression with anxiety Admits to being very type A and anxious at times. May use her Alprazolam prn while we rule out cardiac as a cause of her pain  Rheumatoid arthritis Patient takes her pain meds infrequently, alternates prednisone and Methylpresdnisolone but has not taken much later and understands not to take them together. Takes the Meloxicam frequently and takes the Norco infrequently due to trouble with constipation  Chest pain Describes Chest pressure with SOB, radiation of pain to left jaw, diaphoresis and nausea. No episodes in the past couple days. Is asked to take an aspirin daily and given SL NTG to use prn, she is advised to present to the ER for further evaluation if her cp does not resolve with NTG use, is referred to cardiology for evaluation and she is advised to take it easy until her cardiac work up is completed.  Constipation Well controlled at present, worse when she is taking Norco frequently

## 2011-06-11 NOTE — Assessment & Plan Note (Signed)
Patient takes her pain meds infrequently, alternates prednisone and Methylpresdnisolone but has not taken much later and understands not to take them together. Takes the Meloxicam frequently and takes the Norco infrequently due to trouble with constipation

## 2011-06-11 NOTE — Assessment & Plan Note (Signed)
Well controlled today but patient reporting numbers of 200/100 and higher in the past couple of months but then had a low reading of 90s /60s at her neurologists the other day. Will not change meds based on her numbers today

## 2011-06-11 NOTE — Assessment & Plan Note (Signed)
Admits to being very type A and anxious at times. May use her Alprazolam prn while we rule out cardiac as a cause of her pain

## 2011-06-11 NOTE — Assessment & Plan Note (Signed)
Describes Chest pressure with SOB, radiation of pain to left jaw, diaphoresis and nausea. No episodes in the past couple days. Is asked to take an aspirin daily and given SL NTG to use prn, she is advised to present to the ER for further evaluation if her cp does not resolve with NTG use, is referred to cardiology for evaluation and she is advised to take it easy until her cardiac work up is completed.

## 2011-06-11 NOTE — Assessment & Plan Note (Signed)
S/p 2 hemorrhoidectomies, no recent difficulties

## 2011-06-11 NOTE — Assessment & Plan Note (Signed)
Benign per patient will get old records

## 2011-06-11 NOTE — Assessment & Plan Note (Signed)
Well controlled at present, worse when she is taking Norco frequently

## 2011-06-11 NOTE — Patient Instructions (Signed)
Angina  Angina is discomfort caused by inadequate oxygen delivery to the heart muscle. Angina is a response to blockage or narrowing of the coronary arteries. It alerts you about a blood flow problem to your heart. New, more frequent, or severe angina is a warning signal for you to seek medical care right away.   CAUSES  The coronary arteries supply blood and oxygen to the heart muscle. The heart muscle needs a constant supply of oxygen in order to pump blood to the body. These arteries often become blocked by hardened deposits of blood products (plaque) including fat and cholesterol. The following contribute to the formation of plaque:   High cholesterol.   High blood pressure.   Smoking.   Obesity.   Diabetes.  SYMPTOMS    The most common problem is a deep discomfort in the center of your chest. The discomfort may also be felt in, or move to your:   Arms (especially the left one).   Throat.   Jaw.   Back.   Upper stomach.   Angina may be brought on by exercise, emotional upset, heavy meals, or extremes of heat or cold.   It may resolve within 5 to 10 minutes after a period of rest.   Angina symptoms vary from person to person.   If you have angina and it is treated, it may resolve. If you feel it again, the symptoms may not be the same in both type and location.  DIAGNOSIS    Emergency room evaluation or hospital admission may be needed to determine if there are any blockages of your coronary arteries.   Blood tests, EKGs, and chest X-rays may be done.   Further testing may include a stress test or an angiogram.   A heart specialist (cardiologist) may be asked to assist with your evaluation.   Other conditions that may feel like angina, but are not a problem with the heart include:   Muscle strain in the chest wall.   Blood clots in the lung.   Anxiety.   Acid reflux from the stomach.  RISKS AND COMPLICATIONS   Angina that is not treated or evaluated can lead to a decline in oxygen delivery  to the heart muscle, a heart attack, or even death.  TREATMENT   Depending on severity of the blockages and on other factors, angina may be treated with:   Lifestyle changes such as weight loss, stopping smoking, appropriate exercise, or low cholesterol and low salt diet.   Medications to control or to treat the risk factors for angina.   Procedures such as angioplasty or stent placement may allow the blockages to be opened without surgery.   Open heart surgery may be needed to bypass blocked arteries that cannot be treated by other methods.  HOME CARE INSTRUCTIONS    If your caregiver prescribed medication to control your angina, take them as directed. Report side effects. Do not stop medications or adjust the dosages on your own.   Regular exercise is good for you as long as it does not cause discomfort. Avoid activities that trigger attacks of angina. Walking is the best exercise. Do not begin any new type of exercise until you check with your caregiver.   You may still have a sexual relationship if it does not cause angina. Tell your caregiver if it does.   Stop smoking. Do not use nicotine patches or gum until you check with your caregiver.   If you are overweight, you should lose   weight. Eat a heart healthy diet that is low in fat and salt.   If your caregiver has given you a follow-up appointment, it is very important to keep that appointment. Not keeping the appointment could result in a heart attack, permanent heart damage, and disability. If there is any problem keeping the appointment, you must call back to this facility for assistance.  SEEK MEDICAL CARE IF:    Your angina seems to be occurring more frequently or seems to be lasting longer.   You are having problems that you think may be side effects of the medicine you are taking.  SEEK IMMEDIATE MEDICAL CARE IF:    You have severe chest discomfort, especially if the pain is crushing or pressure-like and spreads to the arms, back, neck, or  jaw.   You are sweating, feel sick to your stomach (nauseous), or have shortness of breath.   You have an attack of angina that does not get better after rest or taking your usual medicine.   You wake from sleep with chest pain.   You feel dizzy, faint, or experience extreme fatigue.   You have chest pain not typical of your usual angina. THIS IS AN EMERGENCY. Do not wait to see if the pain will go away. Get medical help at once. Call 911. DO NOT drive yourself to the hospital.  MAKE SURE YOU:    Understand these instructions.   Will watch your condition.   Will get help right away if you are not doing well or get worse.  Document Released: 03/08/2005 Document Revised: 02/25/2011 Document Reviewed: 10/10/2007  [employer] Patient Information 2012 [employer].

## 2011-06-11 NOTE — Assessment & Plan Note (Signed)
Uses Omeprazole prn, reports she has had an upper endoscopy and colonoscopy in past couple of years but her gastroenterologist has retired, will need referral in the future.

## 2011-06-15 ENCOUNTER — Ambulatory Visit
Admission: RE | Admit: 2011-06-15 | Discharge: 2011-06-15 | Disposition: A | Discharge status: Home / Self Care | Payer: Medicare Other | Source: Ambulatory Visit | Attending: Neurology | Admitting: Neurology

## 2011-06-15 DIAGNOSIS — R519 Headache, unspecified: Secondary | ICD-10-CM

## 2011-06-15 MED ORDER — GADOBENATE DIMEGLUMINE 529 MG/ML IV SOLN
20.0000 mL | Freq: Once | INTRAVENOUS | Status: AC | PRN
  Administered 2011-06-15: 20 mL via INTRAVENOUS

## 2011-06-16 ENCOUNTER — Encounter: Payer: Self-pay | Admitting: Cardiology

## 2011-06-16 ENCOUNTER — Ambulatory Visit (INDEPENDENT_AMBULATORY_CARE_PROVIDER_SITE_OTHER): Payer: Medicare Other | Admitting: Cardiology

## 2011-06-16 DIAGNOSIS — R079 Chest pain, unspecified: Secondary | ICD-10-CM

## 2011-06-16 DIAGNOSIS — I10 Essential (primary) hypertension: Secondary | ICD-10-CM

## 2011-06-16 NOTE — Progress Notes (Signed)
HPI: 56 yo female with no prior cardiac history for evaluation of chest pain. Patient states that for the past one month she has had intermittent chest pain. The pain is in the upper chest and described as a pressure. It can radiate to her neck and back. There is associated nausea, shortness of breath and diaphoresis. It lasts anywhere from 15 minutes to one hour. It can increase with moving her neck. It is not exertional and there are no alleviating factors. Because of the above we are asked to further evaluate. She also complains of dyspnea on exertion and orthopnea. Occasional pedal edema when traveling.  Current Outpatient Prescriptions  Medication Sig Dispense Refill  . Abatacept (ORENCIA) 125 MG/ML SOLN Inject 125 mg into the skin every 30 (thirty) days.      . ALPRAZolam (XANAX) 0.5 MG tablet Take 0.5 mg by mouth at bedtime as needed. 2 to 3 tablets per night      . AMITIZA 24 MCG capsule Take 24 mcg by mouth as needed.       Marland Kitchen aspirin (ASPIRIN EC) 81 MG EC tablet Take 1 tablet (81 mg total) by mouth daily. Swallow whole.  30 tablet  0  . carisoprodol (SOMA) 350 MG tablet Take 350 mg by mouth at bedtime.       Marland Kitchen EPINEPHrine (EPIPEN) 0.3 mg/0.3 mL DEVI Inject 0.3 mLs (0.3 mg total) into the muscle once.  1 Device  0  . escitalopram (LEXAPRO) 10 MG tablet Take 10 mg by mouth as needed.       . etodolac (LODINE) 400 MG tablet Take 400 mg by mouth as needed.       . fluconazole (DIFLUCAN) 100 MG tablet Take 100 mg by mouth as needed.       . furosemide (LASIX) 40 MG tablet Take 40 mg by mouth as needed.       . hydrochlorothiazide (HYDRODIURIL) 25 MG tablet Take 25 mg by mouth as needed.       Marland Kitchen HYDROcodone-acetaminophen (NORCO) 7.5-325 MG per tablet Take 1 tablet by mouth as needed.       . hydroxychloroquine (PLAQUENIL) 200 MG tablet Take 200 mg by mouth daily.       Marland Kitchen leflunomide (ARAVA) 10 MG tablet Take 10 mg by mouth daily.       Marland Kitchen lisinopril (PRINIVIL,ZESTRIL) 40 MG tablet Take 40 mg by  mouth daily.       . meloxicam (MOBIC) 15 MG tablet Take 15 mg by mouth daily.       . methylPREDNIsolone (MEDROL DOSPACK) 4 MG tablet Take 4 mg by mouth as needed.       . nitroGLYCERIN (NITROSTAT) 0.4 MG SL tablet Place 1 tablet (0.4 mg total) under the tongue every 5 (five) minutes as needed for chest pain (. to ER if no imrpovement).  25 tablet  3  . omeprazole (PRILOSEC) 20 MG capsule Take 20 mg by mouth as needed.       . potassium chloride (K-DUR) 10 MEQ tablet Take 10 mEq by mouth daily.       . pramipexole (MIRAPEX) 0.125 MG tablet Take 0.125 mg by mouth at bedtime.       . predniSONE (DELTASONE) 5 MG tablet Take 5 mg by mouth daily.       . rizatriptan (MAXALT-MLT) 5 MG disintegrating tablet Take 5 mg by mouth as needed.       . topiramate (TOPAMAX) 50 MG tablet Take 2 tablets (100 mg  total) by mouth 2 (two) times daily.       No current facility-administered medications for this visit.   Facility-Administered Medications Ordered in Other Visits  Medication Dose Route Frequency Provider Last Rate Last Dose  . gadobenate dimeglumine (MULTIHANCE) injection 20 mL  20 mL Intravenous Once PRN Medication Radiologist, MD   20 mL at 06/15/11 1732    Allergies  Allergen Reactions  . Bee Venom Anaphylaxis  . Codeine     REACTION: vomiting    Past Medical History  Diagnosis Date  . Rheumatoid arthritis   . Overweight 06/11/2011  . Depression with anxiety 02/22/2008    Qualifier: Diagnosis of  By: Creta Levin CMA (AAMA), Robin    . Reflux 06/11/2011  . Colon polyp 06/11/2011  . Hypertension     Recently diagnosed    Past Surgical History  Procedure Date  . Appendectomy   . Abdominal hysterectomy   . Cholecystectomy   . Back surgery     3 times    History   Social History  . Marital Status: Married    Spouse Name: N/A    Number of Children: 1  . Years of Education: N/A   Occupational History  . Not on file.   Social History Main Topics  . Smoking status: Never Smoker    . Smokeless tobacco: Not on file  . Alcohol Use: Yes     Occasional  . Drug Use: No  . Sexually Active: Yes   Other Topics Concern  . Not on file   Social History Narrative  . No narrative on file    Family History  Problem Relation Age of Onset  . Diabetes Mother   . Stroke Mother   . Arthritis Father   . Hypertension Father   . Alcohol abuse Sister   . Cancer Sister     mesothelioma  . Heart disease Sister     cardiomegaly  . Hypertension Sister   . Hypertension Brother   . Polycystic ovary syndrome Daughter   . Heart disease Maternal Grandmother   . Heart disease Maternal Grandfather     ROS:  Arthralgias and back pain but no fevers or chills, productive cough, hemoptysis, dysphasia, odynophagia, melena, hematochezia, dysuria, hematuria, rash, seizure activity, claudication. Remaining systems are negative.  Physical Exam:   Blood pressure 136/84, pulse 84, height 5\' 7"  (1.702 m), weight 215 lb 0.6 oz (97.542 kg).  General:  Well developed/well nourished in NAD Skin warm/dry Patient not depressed No peripheral clubbing Back-normal HEENT-normal/normal eyelids Neck supple/normal carotid upstroke bilaterally; no bruits; no JVD; no thyromegaly chest - CTA/ normal expansion CV - RRR/normal S1 and S2; no murmurs, rubs or gallops;  PMI nondisplaced Abdomen -NT/ND, no HSM, no mass, + bowel sounds, no bruit 2+ femoral pulses, no bruits Ext-no edema, chords, 2+ DP Neuro-grossly nonfocal  ECG 06/11/11 NSR with no ST changes

## 2011-06-16 NOTE — Patient Instructions (Signed)
Your physician recommends that you schedule a follow-up appointment in:  AS NEEDED PENDING TEST RESULTS  Your physician has requested that you have a lexiscan myoview. For further information please visit [email or web address]. Please follow instruction sheet, as given.   

## 2011-06-16 NOTE — Assessment & Plan Note (Signed)
BP controlled on present meds. FU primary care.

## 2011-06-16 NOTE — Assessment & Plan Note (Signed)
Symptoms atypical; will arrange lexiscan myoview for risk stratification. Patient cannot exercise because of back pain and rheumatoid arthritis.

## 2011-06-21 ENCOUNTER — Ambulatory Visit (HOSPITAL_COMMUNITY): Payer: Medicare Other | Attending: Cardiology | Admitting: Radiology

## 2011-06-21 DIAGNOSIS — R0609 Other forms of dyspnea: Secondary | ICD-10-CM | POA: Insufficient documentation

## 2011-06-21 DIAGNOSIS — R0989 Other specified symptoms and signs involving the circulatory and respiratory systems: Secondary | ICD-10-CM | POA: Insufficient documentation

## 2011-06-21 DIAGNOSIS — R9431 Abnormal electrocardiogram [ECG] [EKG]: Secondary | ICD-10-CM

## 2011-06-21 DIAGNOSIS — Z8249 Family history of ischemic heart disease and other diseases of the circulatory system: Secondary | ICD-10-CM | POA: Insufficient documentation

## 2011-06-21 DIAGNOSIS — I10 Essential (primary) hypertension: Secondary | ICD-10-CM | POA: Insufficient documentation

## 2011-06-21 DIAGNOSIS — R61 Generalized hyperhidrosis: Secondary | ICD-10-CM | POA: Insufficient documentation

## 2011-06-21 DIAGNOSIS — R079 Chest pain, unspecified: Secondary | ICD-10-CM | POA: Insufficient documentation

## 2011-06-21 DIAGNOSIS — R110 Nausea: Secondary | ICD-10-CM | POA: Insufficient documentation

## 2011-06-21 MED ORDER — REGADENOSON 0.4 MG/5ML IV SOLN
0.4000 mg | Freq: Once | INTRAVENOUS | Status: AC
  Administered 2011-06-21: 0.4 mg via INTRAVENOUS

## 2011-06-21 MED ORDER — TECHNETIUM TC 99M TETROFOSMIN IV KIT
11.0000 | PACK | Freq: Once | INTRAVENOUS | Status: AC | PRN
  Administered 2011-06-21: 11 via INTRAVENOUS

## 2011-06-21 MED ORDER — TECHNETIUM TC 99M TETROFOSMIN IV KIT
32.0000 | PACK | Freq: Once | INTRAVENOUS | Status: AC | PRN
  Administered 2011-06-21: 32 via INTRAVENOUS

## 2011-06-21 NOTE — Progress Notes (Signed)
[facility] SITE 3 NUCLEAR MED [address] [place] [address] [phone]  Cardiology Nuclear Med Study  Emma [name] is a 56 y.o. female     MRN : [id number]     DOB: 15-Oct-1955  Procedure Date: 06/21/2011  Nuclear Med Background Indication for Stress Test:  Evaluation for Ischemia History:  none Cardiac Risk Factors: Family History - CAD and Hypertension  Symptoms:  Chest Pain, Diaphoresis, DOE, Nausea and SOB   Nuclear Pre-Procedure Caffeine/Decaff Intake:  None NPO After: 8:00pm   Lungs:  clear O2 Sat: 96% on room air. IV 0.9% NS with Angio Cath:  22g  IV Site: R Forearm  IV Started by:  Stanton Kidney, EMT-P  Chest Size (in):  44 Cup Size: DD  Height: 5\' 7"  (1.702 m)  Weight:  212 lb (96.163 kg)  BMI:  Body mass index is 33.20 kg/(m^2). Tech Comments:  NA    Nuclear Med Study 1 or 2 day study: 1 day  Stress Test Type:  Lexiscan  Reading MD: Willa Rough, MD  Order Authorizing Provider:  Olga Millers MD  Resting Radionuclide: Technetium 57m Tetrofosmin  Resting Radionuclide Dose: 11.0 mCi   Stress Radionuclide:  Technetium 14m Tetrofosmin  Stress Radionuclide Dose: 32.0 mCi           Stress Protocol Rest HR: 82 Stress HR: 92  Rest BP: 145/98 Stress BP: 129/72  Exercise Time (min): n/a METS: n/a   Predicted Max HR: 163 bpm % Max HR: 56.44 bpm Rate Pressure Product: 46962   Dose of Adenosine (mg):  n/a Dose of Lexiscan: 0.4 mg  Dose of Atropine (mg): n/a Dose of Dobutamine: n/a mcg/kg/min (at max HR)  Stress Test Technologist: Milana Na, EMT-P  Nuclear Technologist:  Domenic Polite, CNMT     Rest Procedure:  Myocardial perfusion imaging was performed at rest 45 minutes following the intravenous administration of Technetium 47m Tetrofosmin. Rest ECG: NSR - Normal EKG and NSR with non-specific ST-T wave changes  Stress Procedure:  The patient received IV Lexiscan 0.4 mg over 15-seconds.  Technetium 77m Tetrofosmin injected at  30-seconds.  There were no significant changes, abdominal pain, and tense legs with Lexiscan.  Quantitative spect images were obtained after a 45 minute delay. Stress ECG: No significant ST segment change suggestive of ischemia.  QPS Raw Data Images:  Normal; no motion artifact; normal heart/lung ratio. Stress Images:  Normal homogeneous uptake in all areas of the myocardium. Rest Images:  Normal homogeneous uptake in all areas of the myocardium. Subtraction (SDS):  No evidence of ischemia. Transient Ischemic Dilatation (Normal <1.22):  1.06 Lung/Heart Ratio (Normal <0.45):  0.29  Quantitative Gated Spect Images QGS EDV:  62 ml QGS ESV:  20 ml  Impression Exercise Capacity:  Lexiscan with no exercise. BP Response:  Normal blood pressure response. Clinical Symptoms:  Abdominal pain ECG Impression:  No significant ST segment change suggestive of ischemia. Comparison with Prior Nuclear Study: No previous nuclear study performed  Overall Impression:  Normal stress nuclear study.  LV Ejection Fraction: 68%.  LV Wall Motion:  Normal Wall Motion  Willa Rough, MD

## 2011-06-22 HISTORY — PX: CARDIOVASCULAR STRESS TEST: SHX262

## 2011-06-23 ENCOUNTER — Telehealth: Payer: Self-pay | Admitting: Family Medicine

## 2011-06-23 ENCOUNTER — Other Ambulatory Visit: Payer: Self-pay | Admitting: Family Medicine

## 2011-06-23 DIAGNOSIS — R51 Headache: Secondary | ICD-10-CM

## 2011-06-23 DIAGNOSIS — T7840XA Allergy, unspecified, initial encounter: Secondary | ICD-10-CM

## 2011-06-23 NOTE — Telephone Encounter (Signed)
Referral done

## 2011-06-24 NOTE — Telephone Encounter (Signed)
Patient appt [facility]/22/[facility], patient aware

## 2011-06-29 ENCOUNTER — Other Ambulatory Visit: Payer: Self-pay

## 2011-06-29 MED ORDER — PRAMIPEXOLE DIHYDROCHLORIDE 0.125 MG PO TABS: 0.1250 mg | ORAL_TABLET | Freq: Every day | ORAL | Status: DC

## 2011-08-04 ENCOUNTER — Other Ambulatory Visit: Payer: Medicare Other

## 2011-08-04 DIAGNOSIS — I10 Essential (primary) hypertension: Secondary | ICD-10-CM

## 2011-08-04 DIAGNOSIS — R209 Unspecified disturbances of skin sensation: Secondary | ICD-10-CM

## 2011-08-04 LAB — LIPID PANEL
Cholesterol: 176 mg/dL (ref 0–200)
HDL: 44 mg/dL (ref >39)
LDL Cholesterol: 107 mg/dL — ABNORMAL HIGH (ref 0–99)
Total CHOL/HDL Ratio: 4 Ratio
Triglycerides: 125 mg/dL (ref <150)
VLDL: 25 mg/dL (ref 0–40)

## 2011-08-04 LAB — ANGIOTENSIN CONVERTING ENZYME: Angiotensin-Converting Enzyme: 17 U/L (ref 8–52)

## 2011-08-04 LAB — RPR

## 2011-08-04 LAB — SEDIMENTATION RATE: Sed Rate: 27 mm/hr — ABNORMAL HIGH (ref 0–22)

## 2011-08-05 LAB — ANA: Anti Nuclear Antibody(ANA): NEGATIVE

## 2011-08-05 LAB — B. BURGDORFI ANTIBODIES: B burgdorferi Ab IgG+IgM: 0.15 {ISR}

## 2012-04-06 ENCOUNTER — Other Ambulatory Visit: Payer: Self-pay

## 2012-04-06 MED ORDER — PRAMIPEXOLE DIHYDROCHLORIDE 0.125 MG PO TABS: 0.1250 mg | ORAL_TABLET | Freq: Every day | ORAL | Status: DC

## 2012-06-01 ENCOUNTER — Encounter: Payer: Self-pay | Admitting: Family Medicine

## 2012-06-01 ENCOUNTER — Ambulatory Visit (INDEPENDENT_AMBULATORY_CARE_PROVIDER_SITE_OTHER): Payer: Medicare Other | Admitting: Family Medicine

## 2012-06-01 VITALS — BP 130/84 | HR 68 | Ht 67.0 in | Wt 232.0 lb

## 2012-06-01 DIAGNOSIS — K648 Other hemorrhoids: Secondary | ICD-10-CM

## 2012-06-01 DIAGNOSIS — K645 Perianal venous thrombosis: Secondary | ICD-10-CM

## 2012-06-01 MED ORDER — PRAMIPEXOLE DIHYDROCHLORIDE 0.125 MG PO TABS: 0.1250 mg | ORAL_TABLET | Freq: Every day | ORAL | Status: DC

## 2012-06-01 NOTE — Progress Notes (Signed)
OFFICE NOTE  06/01/2012  CC:  Chief Complaint  Patient presents with  . Hemorrhoids     HPI: Patient is a 57 y.o. Caucasian female who is here for urgent visit to evaluate painful hemorrhoids.   Onset of pain last week after she had run out of her stool softener;  severe, feels swelling with finger.  It was not bleeding, but her husband nicked it with a straight razor 3n/a b/c the pain was unbearable and she couldn't sit on her bottom at all.  This alleviated the pain a little and has incited a bit of blood tinged drainage since then.  No prior hemorrhoid like this but hx surgery x 2 in the past for internal hemorrhoids. Soaks, [facility] didn't help.  She has not tried anything else topically.   Pertinent PMH:  Past Medical History  Diagnosis Date  . Rheumatoid arthritis   . Overweight 06/11/2011  . Depression with anxiety 02/22/2008    Qualifier: Diagnosis of  By: Creta Levin CMA (AAMA), Robin    . Reflux 06/11/2011  . Colon polyp 06/11/2011  . Hypertension     Recently diagnosed    MEDS:  Outpatient Prescriptions Prior to Visit  Medication Sig Dispense Refill  . Abatacept (ORENCIA) 125 MG/ML SOLN Inject 125 mg into the skin every 30 (thirty) days.      . ALPRAZolam (XANAX) 0.5 MG tablet Take 0.5 mg by mouth at bedtime as needed. 2 to 3 tablets per night      . AMITIZA 24 MCG capsule Take 24 mcg by mouth as needed.       Marland Kitchen aspirin (ASPIRIN EC) 81 MG EC tablet Take 1 tablet (81 mg total) by mouth daily. Swallow whole.  30 tablet  0  . carisoprodol (SOMA) 350 MG tablet Take 350 mg by mouth at bedtime.       Marland Kitchen EPINEPHrine (EPIPEN) 0.3 mg/0.3 mL DEVI Inject 0.3 mLs (0.3 mg total) into the muscle once.  1 Device  0  . escitalopram (LEXAPRO) 10 MG tablet Take 10 mg by mouth as needed.       . etodolac (LODINE) 400 MG tablet Take 400 mg by mouth as needed.       . furosemide (LASIX) 40 MG tablet Take 40 mg by mouth as needed.       . hydrochlorothiazide (HYDRODIURIL) 25 MG  tablet Take 25 mg by mouth as needed.       Marland Kitchen HYDROcodone-acetaminophen (NORCO) 7.5-325 MG per tablet Take 1 tablet by mouth as needed.       . hydroxychloroquine (PLAQUENIL) 200 MG tablet Take 200 mg by mouth daily.       Marland Kitchen leflunomide (ARAVA) 10 MG tablet Take 10 mg by mouth daily.       Marland Kitchen lisinopril (PRINIVIL,ZESTRIL) 40 MG tablet Take 40 mg by mouth daily.       . meloxicam (MOBIC) 15 MG tablet Take 15 mg by mouth daily.       . nitroGLYCERIN (NITROSTAT) 0.4 MG SL tablet Place 1 tablet (0.4 mg total) under the tongue every 5 (five) minutes as needed for chest pain (. to ER if no imrpovement).  25 tablet  3  . omeprazole (PRILOSEC) 20 MG capsule Take 20 mg by mouth as needed.       . potassium chloride (K-DUR) 10 MEQ tablet Take 10 mEq by mouth daily.       . pramipexole (MIRAPEX) 0.125 MG tablet Take 1 tablet (0.125 mg  total) by mouth at bedtime.  90 tablet  0  . rizatriptan (MAXALT-MLT) 5 MG disintegrating tablet Take 5 mg by mouth as needed.       . topiramate (TOPAMAX) 50 MG tablet Take 2 tablets (100 mg total) by mouth 2 (two) times daily.      . fluconazole (DIFLUCAN) 100 MG tablet Take 100 mg by mouth as needed.       . methylPREDNIsolone (MEDROL DOSPACK) 4 MG tablet Take 4 mg by mouth as needed.       . predniSONE (DELTASONE) 5 MG tablet Take 5 mg by mouth daily.        No facility-administered medications prior to visit.    PE: Patient examined with CMA Francee Piccolo in room for chaperone and assistance. Blood pressure 130/84, pulse 68, height 5\' 7"  (1.702 m), weight 232 lb (105.235 kg). Gen: Alert, well appearing.  Patient is oriented to person, place, time, and situation. Anal: 3 cm prolapsed internal hemorrhoid present--able to push back into anal canal for the most part, nontender. At about 1 o'clock position there is a flat external hemorrhoid but focused palpation reveals a pea sized area of firmness and tenderness.  There is a 1mm knick in the skin over this hemorrhoid  and with compression I can express some slight clearish/yellow fluid.  No erythema of hemorrhoid or around hemorrhoid.  No anal fissures.  IMPRESSION AND PLAN:  Thrombosed external hemorrhoid, partially drained.   I used sterile technique to finish incising and draining this with a #11 scalpal and forceps.  I did anesthetize the area with 2 cc of 2% lidocaine w/out epi prior to I&D.  Pt tolerated procedure well.  Internal hemorrhoid, prolapsed but asymptomatic. Signs/symptoms to call or return for were reviewed and pt expressed understanding.  Dressed wound and discussed qd-bid soaks until complete healing.  FOLLOW UP: prn

## 2012-06-07 ENCOUNTER — Ambulatory Visit: Payer: Medicare Other | Admitting: Family Medicine

## 2012-06-29 ENCOUNTER — Ambulatory Visit: Payer: Medicare Other | Admitting: Family Medicine

## 2012-07-20 ENCOUNTER — Ambulatory Visit: Payer: Medicare Other | Admitting: Family Medicine

## 2012-07-20 ENCOUNTER — Encounter: Payer: Self-pay | Admitting: Family Medicine

## 2012-07-20 VITALS — BP 140/90 | HR 91 | Temp 98.7°F | Ht 67.0 in | Wt 230.0 lb

## 2012-07-20 DIAGNOSIS — L720 Epidermal cyst: Secondary | ICD-10-CM

## 2012-07-20 DIAGNOSIS — H6982 Other specified disorders of Eustachian tube, left ear: Secondary | ICD-10-CM

## 2012-07-20 DIAGNOSIS — H6980 Other specified disorders of Eustachian tube, unspecified ear: Secondary | ICD-10-CM | POA: Insufficient documentation

## 2012-07-20 NOTE — Patient Instructions (Signed)
Buy OTC nasacort and use 2 sprays in each nostril once every day.  If you fly, buy OTC generic Afrin and use 2 sprays in each nostril 30 min prior to take off.

## 2012-07-20 NOTE — Assessment & Plan Note (Signed)
Tiny, encouraged application of heat regularly.  It appears to be already getting smaller per pt.

## 2012-07-20 NOTE — Progress Notes (Signed)
OFFICE NOTE  07/20/2012  CC:  Chief Complaint  Patient presents with  . Otalgia     HPI: Patient is a 57 y.o. Caucasian female who is here for ear pain. Achy left ear pain, mild, for about the last 3 wks, then noted a little knot near front of ear earlier this week. No fevers.  +nasal congestion and watery eyes with the pollen increase lately.  No cough.  She uses Q tips on outside portion of ear canal.  Pertinent PMH:  Past Medical History  Diagnosis Date  . Rheumatoid arthritis   . Overweight 06/11/2011  . Depression with anxiety 02/22/2008    Qualifier: Diagnosis of  By: Creta Levin CMA (AAMA), Robin    . GERD (gastroesophageal reflux disease) 06/11/2011  . Colon polyp 06/11/2011    Most recent 04/23/08, no polyp--mild diverticulosis.  Repeat 10 yrs.  . Hypertension     Recently diagnosed  . Chronic constipation   . Migraine syndrome    Past Surgical History  Procedure Laterality Date  . Appendectomy    . Abdominal hysterectomy    . Cholecystectomy    . Back surgery      3 times  . Cardiovascular stress test  06/22/11    Normal; EF 65%    MEDS:  Outpatient Prescriptions Prior to Visit  Medication Sig Dispense Refill  . Abatacept (ORENCIA) 125 MG/ML SOLN Inject 125 mg into the skin every 30 (thirty) days.      . ALPRAZolam (XANAX) 0.5 MG tablet Take 0.5 mg by mouth at bedtime as needed. 2 to 3 tablets per night      . AMITIZA 24 MCG capsule Take 24 mcg by mouth as needed.       . carisoprodol (SOMA) 350 MG tablet Take 350 mg by mouth at bedtime.       Marland Kitchen EPINEPHrine (EPIPEN) 0.3 mg/0.3 mL DEVI Inject 0.3 mLs (0.3 mg total) into the muscle once.  1 Device  0  . escitalopram (LEXAPRO) 10 MG tablet Take 10 mg by mouth as needed.       . etodolac (LODINE) 400 MG tablet Take 400 mg by mouth as needed.       . furosemide (LASIX) 40 MG tablet Take 40 mg by mouth as needed.       . gabapentin (NEURONTIN) 100 MG capsule Take 1 capsule by mouth 3 (three) times daily.      .  hydrochlorothiazide (HYDRODIURIL) 25 MG tablet Take 25 mg by mouth as needed.       Marland Kitchen HYDROcodone-acetaminophen (NORCO) 7.5-325 MG per tablet Take 1 tablet by mouth as needed.       . hydroxychloroquine (PLAQUENIL) 200 MG tablet Take 200 mg by mouth daily.       Marland Kitchen leflunomide (ARAVA) 10 MG tablet Take 10 mg by mouth daily.       Marland Kitchen lisinopril (PRINIVIL,ZESTRIL) 40 MG tablet Take 40 mg by mouth daily.       . meloxicam (MOBIC) 15 MG tablet Take 15 mg by mouth daily.       Marland Kitchen omeprazole (PRILOSEC) 20 MG capsule Take 20 mg by mouth as needed.       . potassium chloride (K-DUR) 10 MEQ tablet Take 10 mEq by mouth daily.       . pramipexole (MIRAPEX) 0.125 MG tablet Take 1 tablet (0.125 mg total) by mouth at bedtime.  90 tablet  0  . rizatriptan (MAXALT-MLT) 5 MG disintegrating tablet Take 5 mg by mouth  as needed.       . topiramate (TOPAMAX) 50 MG tablet Take 2 tablets (100 mg total) by mouth 2 (two) times daily.       No facility-administered medications prior to visit.    PE: Blood pressure 140/90, pulse 91, temperature 98.7 F (37.1 C), temperature source Oral, height 5\' 7"  (1.702 m), weight 230 lb (104.327 kg), SpO2 97.00%. Gen: Alert, well appearing.  Patient is oriented to person, place, time, and situation. ENT: Ears: EACs clear, normal epithelium.  TMs with good light reflex and landmarks bilaterally.   Just anterior to left ear tragus there is a bee-bee sized sub Q nodule that is rubbery feeling and moveable, no signif tenderness and no erythema.   No adenopathy or mass or swelling of any of the facial tissues besides this small cystic-feeling structure. Eyes: no injection, icteris, swelling, or exudate.  EOMI, PERRLA. Nose: no drainage or turbinate edema/swelling.  No injection or focal lesion.  Mouth: lips without lesion/swelling.  Oral mucosa pink and moist.  Dentition intact and without obvious caries or gingival swelling.  Oropharynx without erythema, exudate, or swelling.  Neck - No  masses or thyromegaly or limitation in range of motion    IMPRESSION AND PLAN:  Eustachian tube dysfunction Discussed dx, reassured pt no infection present. Recommended she try daily nasacort steroid spray daily, afrin prn flying in airplane.  Epidermal inclusion cyst Tiny, encouraged application of heat regularly.  It appears to be already getting smaller per pt.   An After Visit Summary was printed and given to the patient.  FOLLOW UP: make appt for CPE with fasting labs at your earliest convenience

## 2012-07-20 NOTE — Assessment & Plan Note (Signed)
Discussed dx, reassured pt no infection present. Recommended she try daily nasacort steroid spray daily, afrin prn flying in airplane.

## 2012-07-26 ENCOUNTER — Encounter: Payer: Medicare Other | Admitting: Family Medicine

## 2012-08-15 ENCOUNTER — Other Ambulatory Visit: Payer: Self-pay | Admitting: *Deleted

## 2012-08-15 MED ORDER — LUBIPROSTONE 24 MCG PO CAPS: 24.0000 ug | ORAL_CAPSULE | ORAL | Status: DC | PRN

## 2012-08-15 NOTE — Telephone Encounter (Signed)
Rx request to pharmacy/SLS  

## 2012-09-08 ENCOUNTER — Telehealth: Payer: Self-pay | Admitting: *Deleted

## 2012-09-08 DIAGNOSIS — I639 Cerebral infarction, unspecified: Secondary | ICD-10-CM

## 2012-09-08 NOTE — Telephone Encounter (Signed)
Patient would like something called in for headaches, unable to come for appt.

## 2012-09-08 NOTE — Telephone Encounter (Signed)
Not sure when i have seen her and if this was for HA, migraines. Looks like she needs a follow up with CM .

## 2012-09-08 NOTE — Telephone Encounter (Signed)
Message copied by Monico Blitz on Fri Sep 08, 2012 12:16 PM ------      Message from: Eugenie Birks      Created: Fri Sep 08, 2012 11:39 AM      Contact: patient       LM yesterday that she has a horrible migraine headache and she still has it and the meds are not working. She wants the nurse to call her back because she doesn't know what to do ------

## 2012-09-08 NOTE — Telephone Encounter (Signed)
Did you just copy the message again instead of answering the question????

## 2012-09-09 MED ORDER — GABAPENTIN 300 MG PO CAPS: 300.0000 mg | ORAL_CAPSULE | Freq: Three times a day (TID) | ORAL | Status: DC

## 2012-09-09 NOTE — Telephone Encounter (Signed)
Ammie,  have no recollection of this patient - was she seen for HA ? By whom ?  is this a new problem, can you please look into centricity - last visit? Who is her Doctor ??????? We have no [facility]  prescriber for  meds in epic, ordering something medicinal  requires access to this information. What is she taking now?  I have called this patient on Saturday - no I am not on call - and clarified what should be done by triage: she is Dr Ronnald Ramp patient , her HA have been untouched by the primary care physicians medications and she believes dr Kathie Rhodes prescribed Neuronin/ gabapentin. i  Increased the dose of this medication to 300 mg tid po. And called the meds to [facility].

## 2012-09-11 NOTE — Telephone Encounter (Signed)
triaged this call myself.

## 2012-09-13 ENCOUNTER — Telehealth: Payer: Self-pay | Admitting: Neurology

## 2012-12-11 ENCOUNTER — Telehealth: Payer: Self-pay | Admitting: Family Medicine

## 2012-12-11 NOTE — Telephone Encounter (Signed)
Please advise 

## 2012-12-11 NOTE — Telephone Encounter (Signed)
This is not a shot that I can give.  If she has had some type of similar shot before by a specialist and needs referral then I could order referral.  She may just need to come in and be seen.  Pls call pt and collect more details/clarify.-thx

## 2012-12-12 NOTE — Telephone Encounter (Signed)
Patient said she got it done by a family doctor.  She didn't say who.  She said she took some medicine and is trying to now sleep it off.  She didn't seem awake when I was talking to her but she states that she will call back if she believes she needs to se a specialist.

## 2012-12-12 NOTE — Telephone Encounter (Signed)
Noted  

## 2012-12-25 ENCOUNTER — Other Ambulatory Visit: Payer: Self-pay | Admitting: Family Medicine

## 2012-12-25 MED ORDER — LUBIPROSTONE 24 MCG PO CAPS: 24.0000 ug | ORAL_CAPSULE | ORAL | Status: DC | PRN

## 2012-12-25 MED ORDER — PRAMIPEXOLE DIHYDROCHLORIDE 0.125 MG PO TABS: 0.1250 mg | ORAL_TABLET | Freq: Every day | ORAL | Status: DC

## 2013-04-06 ENCOUNTER — Ambulatory Visit (INDEPENDENT_AMBULATORY_CARE_PROVIDER_SITE_OTHER): Payer: Medicare Other | Admitting: Medical

## 2013-04-06 ENCOUNTER — Encounter: Payer: Self-pay | Admitting: Medical

## 2013-04-06 ENCOUNTER — Telehealth: Payer: Self-pay | Admitting: Medical

## 2013-04-06 VITALS — BP 132/80 | HR 80 | Temp 98.2°F | Resp 16 | Wt 238.0 lb

## 2013-04-06 DIAGNOSIS — H1010 Acute atopic conjunctivitis, unspecified eye: Secondary | ICD-10-CM

## 2013-04-06 DIAGNOSIS — E669 Obesity, unspecified: Secondary | ICD-10-CM

## 2013-04-06 DIAGNOSIS — R0982 Postnasal drip: Secondary | ICD-10-CM

## 2013-04-06 DIAGNOSIS — J309 Allergic rhinitis, unspecified: Secondary | ICD-10-CM

## 2013-04-06 DIAGNOSIS — H938X9 Other specified disorders of ear, unspecified ear: Secondary | ICD-10-CM

## 2013-04-06 MED ORDER — FLUTICASONE PROPIONATE 50 MCG/ACT NA SUSP: 2.0000 | Freq: Every day | NASAL | Status: DC

## 2013-04-06 NOTE — Patient Instructions (Signed)
Symptoms and exam suggests allergen triggered runny nose, post nasal drip, and ear pressure.  Currently this doesn't suggest sinus or ear infection   Begin Zyrtec or Allegra or Benadryl at bedtime for the next few weeks at least  Begin Flonase nasal spray, 1-2 sprays per nostril.  Use this twice daily the first week, then once daily in the morning  You can use OTC Allergy eye drop for itchy watery eyes.    Consider nasal saline flush  If not much improved in a week, let me know.

## 2013-04-06 NOTE — Telephone Encounter (Signed)
In reference to her concern for weight loss, I reviewed her chart.  Last labs over a year ago.  I would recommend we get some updated labs on glucose, liver, kidney, thyroid first.    If labs ok, then we can consider Qsymia or Belviq.  She should call her insurer first on coverage.   Also, if agreeable, refer to nutritionist to help her with meal planing to lose weight.

## 2013-04-06 NOTE — Progress Notes (Signed)
Subjective:  Emma [name] is a 58 y.o. female who presents as new patient.  I use to see her 5 years ago at [facility].  Establishing today.   Here for left ear pain.  Been having pain in ear for a few weeks, had seen different doctor for same a month ago ([facility]), no ear infection seen.  Still having cough, nasal drainage, pressure in left ear and neck.  Heat pad to side of head/neck helps.  +post nasal drainage.  Some cough.   Eyes can water at times.   No sinus pain, mostly left sided pain.  No popping, but hearing seems a little muffled left.   No sore throat, no fever, no SOB, no NVD.  Patient is a non-smoker.  Using OTC allergy pills for symptoms.  Denies sick contacts.  No other aggravating or relieving factors.    She would like to use a medication to help lose weight.  Has had problems with weight, can't seem to lose weight.  Tries to eat healthy.  exercise is somewhat limited by RA.  Sees rheumatology in [place].   No other c/o.  ROS as in subjective   Objective: Filed Vitals:   04/06/13 1410  BP: 132/80  Pulse: 80  Temp: 98.2 F (36.8 C)  Resp: 16    General appearance: Alert, WD/WN, no distress                             Skin: warm, no rash                           Head: no sinus tenderness,                            Eyes: conjunctiva normal, corneas clear, PERRLA                            Ears: pearly TMs, external ear canals normal                          Nose: septum midline, turbinates swollen, with erythema and clear discharge             Mouth/throat: MMM, tongue normal, mild pharyngeal erythema                           Neck: supple, no adenopathy, no thyromegaly, nontender                          Heart: RRR, normal S1, S2, no murmurs                         Lungs: CTA bilaterally, no wheezes, rales, or rhonchi      Assessment and Plan:   Encounter Diagnoses  Name Primary?  . Allergic conjunctivitis and rhinitis Yes  . Post-nasal drip   . Ear  pressure   . Obesity, unspecified    Allergic rhinitis and conjunctivitis, post nasal drip, ear pressure - like allergen induced.   See instructions below.  Obesity - discussed need for healthy diet, exercise, calorie restriction.  She is interested in [place]. I will reviewed chart record and consider medication.   Patient Instructions  Symptoms and exam suggests allergen triggered runny nose, post nasal drip, and ear pressure.  Currently this doesn't suggest sinus or ear infection   Begin Zyrtec or Allegra or Benadryl at bedtime for the next few weeks at least  Begin Flonase nasal spray, 1-2 sprays per nostril.  Use this twice daily the first week, then once daily in the morning  You can use OTC Allergy eye drop for itchy watery eyes.    Consider nasal saline flush  If not much improved in a week, let me know.        f/u pending call back

## 2013-04-09 LAB — HM MAMMOGRAPHY

## 2013-04-09 NOTE — Telephone Encounter (Signed)
Left message for pt to call me back 

## 2013-04-09 NOTE — Telephone Encounter (Signed)
Please handle

## 2013-04-09 NOTE — Telephone Encounter (Signed)
Called pt and pt will come in this Wednesday for labs. Please put in future orders for her. She will check with her insurance on med and she wants to wait for the referral on the nutritionist until she finds out labs

## 2013-04-10 ENCOUNTER — Other Ambulatory Visit: Payer: Self-pay | Admitting: Medical

## 2013-04-10 DIAGNOSIS — E669 Obesity, unspecified: Secondary | ICD-10-CM

## 2013-04-10 DIAGNOSIS — Z79899 Other long term (current) drug therapy: Secondary | ICD-10-CM

## 2013-04-10 NOTE — Telephone Encounter (Signed)
i put the standing order labs in.  She can come in fasting Wednesday for labs as planned.  She will need OV to f/u on labs, medication consideration for weight los.

## 2013-04-10 NOTE — Telephone Encounter (Signed)
Left message up front on nurse visit sheet for when pt comes tomorrow for labs,either the nurse or check in can tell her that she needs an ov for follow-up on labs and discuss weight loss and see if she will make an appt

## 2013-04-11 ENCOUNTER — Other Ambulatory Visit: Payer: BLUE CROSS/BLUE SHIELD

## 2013-04-13 ENCOUNTER — Other Ambulatory Visit: Payer: Medicare Other

## 2013-04-13 ENCOUNTER — Encounter: Payer: Self-pay | Admitting: Internal Medicine

## 2013-04-13 DIAGNOSIS — Z79899 Other long term (current) drug therapy: Secondary | ICD-10-CM

## 2013-04-13 DIAGNOSIS — E669 Obesity, unspecified: Secondary | ICD-10-CM

## 2013-04-13 LAB — CBC
HCT: 39.2 % (ref 36.0–46.0)
Hemoglobin: 13.2 g/dL (ref 12.0–15.0)
MCH: 30.2 pg (ref 26.0–34.0)
MCHC: 33.7 g/dL (ref 30.0–36.0)
MCV: 89.7 fL (ref 78.0–100.0)
Platelets: 266 10*3/uL (ref 150–400)
RBC: 4.37 MIL/uL (ref 3.87–5.11)
RDW: 14 % (ref 11.5–15.5)
WBC: 7.3 10*3/uL (ref 4.0–10.5)

## 2013-04-14 LAB — HEMOGLOBIN A1C
Hgb A1c MFr Bld: 6.7 % — ABNORMAL HIGH (ref <5.7)
Mean Plasma Glucose: 146 mg/dL — ABNORMAL HIGH (ref <117)

## 2013-04-14 LAB — COMPREHENSIVE METABOLIC PANEL
ALT: 17 U/L (ref 0–35)
AST: 20 U/L (ref 0–37)
Albumin: 4.1 g/dL (ref 3.5–5.2)
Alkaline Phosphatase: 73 U/L (ref 39–117)
BUN: 20 mg/dL (ref 6–23)
CO2: 25 mEq/L (ref 19–32)
Calcium: 9.1 mg/dL (ref 8.4–10.5)
Chloride: 105 mEq/L (ref 96–112)
Creat: 0.98 mg/dL (ref 0.50–1.10)
Glucose, Bld: 94 mg/dL (ref 70–99)
Potassium: 4.4 mEq/L (ref 3.5–5.3)
Sodium: 142 mEq/L (ref 135–145)
Total Bilirubin: 0.4 mg/dL (ref 0.3–1.2)
Total Protein: 7.1 g/dL (ref 6.0–8.3)

## 2013-04-14 LAB — TSH: TSH: 1.161 u[IU]/mL (ref 0.350–4.500)

## 2013-04-16 ENCOUNTER — Ambulatory Visit (INDEPENDENT_AMBULATORY_CARE_PROVIDER_SITE_OTHER): Payer: Medicare Other | Admitting: Medical

## 2013-04-16 ENCOUNTER — Encounter: Payer: Self-pay | Admitting: Medical

## 2013-04-16 VITALS — BP 148/80 | HR 80 | Temp 98.0°F | Resp 16 | Wt 189.0 lb

## 2013-04-16 DIAGNOSIS — R609 Edema, unspecified: Secondary | ICD-10-CM

## 2013-04-16 DIAGNOSIS — R7301 Impaired fasting glucose: Secondary | ICD-10-CM

## 2013-04-16 DIAGNOSIS — E669 Obesity, unspecified: Secondary | ICD-10-CM

## 2013-04-16 DIAGNOSIS — R0989 Other specified symptoms and signs involving the circulatory and respiratory systems: Secondary | ICD-10-CM

## 2013-04-16 MED ORDER — METFORMIN HCL 500 MG PO TABS: 500.0000 mg | ORAL_TABLET | Freq: Two times a day (BID) | ORAL | Status: DC

## 2013-04-16 NOTE — Progress Notes (Signed)
Subjective: Here to followup on recent labs and obesity.  She notes that she could do better in relation to breads, that is her weak point with the diet.  She exercises some, but has aches and pains on a regular basis.  Stays sore feeling in her legs all the time. She has a history of rheumatoid arthritis, sees rheumatology in [place], [place].  She continues to drive there for rheumatoid care given that she is always been with his provider.  She was seeing a weight loss clinic in [place] last year, was getting the HCG injections for a while, was even prescribed Phentermine at one point but doesn't recall taking it but about 2 weeks.  She gets swelling in her legs that time. Worse after being on her feet all day and on long trips, has used compression nose on and off.    Past Medical History  Diagnosis Date  . Rheumatoid arthritis(714.0)   . Overweight 06/11/2011  . Depression with anxiety 02/22/2008    Qualifier: Diagnosis of  By: Nolon Rod CMA (AAMA), Robin    . GERD (gastroesophageal reflux disease) 06/11/2011  . Colon polyp 06/11/2011    Most recent 04/23/08, no polyp--mild diverticulosis.  Repeat 10 yrs.  . Hypertension     Recently diagnosed  . Chronic constipation   . Migraine syndrome   . PUD (peptic ulcer disease) 04/23/08    Multiple antrum ulcers; h pylori neg   ROS as in subjective  Objective: BP 148/80  Pulse 80  Temp(Src) 98 F (36.7 C) (Oral)  Resp 16  Wt 189 lb (85.73 kg)   General: WD/WN, no acute distress, obese white female Pedal pulses 1+, faint bilat Ext: no obvious edema today Heart: RRR, normal S1, S2, no murmurs Lungs: clear  Assessment: Encounter Diagnoses  Name Primary?  . Obesity, unspecified Yes  . Impaired fasting blood sugar   . Edema   . Decreased pedal pulses     Plan: We discussed her concerns, discussed diet, discussed exercise discussed her pains.  We discussed strategies to help lose weight .  First and foremost we discussed diet in the  need to get this under control.  She is borderline diabetic.  We discussed her recent labs which were normal except for glucose/hemoglobin A1c.  After discussing medication options, I will have her begin metformin. Discussed risk and benefits of this medicine. Of note, Belviq would be over $200 per month.  I'll refer her to nutritionist.  Advise she begin wearing her compression nose daily. Get exercise when possible. We will refer for ABIs given the decreased pulses and pains in her legs.  Recheck in 1-2 months.

## 2013-04-18 ENCOUNTER — Other Ambulatory Visit: Payer: Medicare Other

## 2013-04-20 ENCOUNTER — Telehealth: Payer: Self-pay | Admitting: Family Medicine

## 2013-04-20 NOTE — Telephone Encounter (Signed)
Thanks.  These are borderline.

## 2013-04-20 NOTE — Telephone Encounter (Signed)
Patient called to inform you of her BS. CLS Tuesday- 110 Wednesday-112 Thursday- 108 Friday- 113 THESE ARE HER MORNING BS FASTING.

## 2013-04-25 ENCOUNTER — Telehealth: Payer: Self-pay | Admitting: *Deleted

## 2013-04-25 NOTE — Telephone Encounter (Addendum)
Pt called and is having headaches , the last 2 days , taking gabapentin 300mg  po tid and is not helping this.  She takes percocet and this has not helped either.  Last seen 2 yrs ago.  Offered appt tomorrow and she was not able to come.   Made appt for Friday, with Charlott Holler, NP.

## 2013-04-27 ENCOUNTER — Ambulatory Visit (INDEPENDENT_AMBULATORY_CARE_PROVIDER_SITE_OTHER): Payer: Medicare Other | Admitting: Nurse Practitioner

## 2013-04-27 VITALS — BP 146/91 | HR 98 | Wt 237.0 lb

## 2013-04-27 DIAGNOSIS — G44009 Cluster headache syndrome, unspecified, not intractable: Secondary | ICD-10-CM

## 2013-04-27 HISTORY — DX: Cluster headache syndrome, unspecified, not intractable: G44.009

## 2013-04-27 MED ORDER — VERAPAMIL HCL ER 240 MG PO TBCR: 240.0000 mg | EXTENDED_RELEASE_TABLET | Freq: Every day | ORAL | Status: DC

## 2013-04-27 MED ORDER — RIZATRIPTAN BENZOATE 10 MG PO TBDP: 10.0000 mg | ORAL_TABLET | ORAL | Status: DC | PRN

## 2013-04-27 NOTE — Progress Notes (Signed)
PATIENT: Emma [name] DOB: 12-Apr-1955   REASON FOR VISIT: follow up HISTORY FROM: patient  HISTORY OF PRESENT ILLNESS: 58 year old Caucasian female who previously seen Dr. Leonie Man approximately 3 years ago for left-sided head pain who was lost to followup at that time with persistent left-sided head pain which has worsened in the last year. She taking Topamax 50 mg twice a day without relief. She reports these headaches are sharp , shooting and start behind left eye and shoot to left sideof face and neck may last up to 2 days where she remains in the bed. She has to be in the dark for relief.  During that time her mother died.    11/18/2011 (JM): Returns for follow up visit since 07/27/11.  Reports she is having headaches 3 days per week, was 2 days per week.   She has been taking Topamax 100mg  BID, tolerating well but no relief.  She has also been taking gabapentin 100mg  daily, denies any side effects she was concerned about having seizures and not being able to stop the medication.  She can not relate any triggers to her headaches.  Today she has a severe headache on the left side that started on Sunday and has not had any relief.   Reports photophobia and intermittent nausea.  She has tried Maxalt in the past but stopped secondary to cost, this was prior to going generic.   She also has history of degenerative disc disease.     02 /06/15 (LL):   She comes in for worsening headaches.  Last here in mid 2013.  She has continued to take Gabapentin since that time. But sees no benefit.   Headaches are always left sided, temporal and parietal, vascular type which regularly wake her up at 4 am.  They are accompanied with left eye injection, without tearing.  She often has to stay in dark room most of the day with headache, and nausea often is present.  She endorses poor sleep, even with alprazolam, soma, mirapex and lunesta nightly.    Review of Systems  Out of a complete 14 system review, the patient  complains of only the following symptoms, and all other reviewed systems are negative.  Constitutional: Fatigue   Cardiovascular: Swelling in legs   Eyes: Eye pain   Hematology/Lymphatic: Easy bruising   Sleep: Sleepiness   Restless legs   Musculoskeletal: Joint pain   Joint swelling   Cramps   Aching muscles   Psychiatric: Not enough sleep   Decreased energy   Neurologic: Headache   ALLERGIES: Allergies  Allergen Reactions  . Bee Venom Anaphylaxis  . Codeine     REACTION: vomiting    HOME MEDICATIONS: Outpatient Prescriptions Prior to Visit  Medication Sig Dispense Refill  . ALPRAZolam (XANAX) 0.5 MG tablet Take 0.5 mg by mouth at bedtime as needed. 2 to 3 tablets per night      . carisoprodol (SOMA) 350 MG tablet Take 350 mg by mouth at bedtime.       . eszopiclone (LUNESTA) 2 MG TABS tablet Take 3 mg by mouth at bedtime as needed for sleep. Take immediately before bedtime      . etodolac (LODINE) 400 MG tablet Take 400 mg by mouth as needed.       . fluticasone (FLONASE) 50 MCG/ACT nasal spray Place 2 sprays into both nostrils daily.  16 g  2  . gabapentin (NEURONTIN) 300 MG capsule Take 1 capsule (300 mg total) by mouth 3 (three)  times daily.  90 capsule  11  . hydrochlorothiazide (HYDRODIURIL) 25 MG tablet Take 25 mg by mouth as needed.       Marland Kitchen HYDROcodone-acetaminophen (NORCO) 7.5-325 MG per tablet Take 1 tablet by mouth as needed.       . lubiprostone (AMITIZA) 24 MCG capsule Take 1 capsule (24 mcg total) by mouth as needed.  30 capsule  3  . metFORMIN (GLUCOPHAGE) 500 MG tablet Take 1 tablet (500 mg total) by mouth 2 (two) times daily with a meal. 1 tablet po BID for diabetes  60 tablet  3  . pramipexole (MIRAPEX) 0.125 MG tablet Take 1 tablet (0.125 mg total) by mouth at bedtime.  90 tablet  0   No facility-administered medications prior to visit.    PAST MEDICAL HISTORY: Past Medical History  Diagnosis Date  . Rheumatoid arthritis(714.0)   . Overweight 06/11/2011  .  Depression with anxiety 02/22/2008    Qualifier: Diagnosis of  By: Nolon Rod CMA (AAMA), Robin    . GERD (gastroesophageal reflux disease) 06/11/2011  . Colon polyp 06/11/2011    Most recent 04/23/08, no polyp--mild diverticulosis.  Repeat 10 yrs.  . Hypertension     Recently diagnosed  . Chronic constipation   . Migraine syndrome   . PUD (peptic ulcer disease) 04/23/08    Multiple antrum ulcers; h pylori neg    PAST SURGICAL HISTORY: Past Surgical History  Procedure Laterality Date  . Appendectomy    . Abdominal hysterectomy    . Cholecystectomy    . Back surgery      3 times  . Cardiovascular stress test  06/22/11    Normal; EF 65%  . Liver biopsy  03/2007    Chronic active hepatitis w/fibrosis--related to hx of methotrexate treatment.    FAMILY HISTORY: Family History  Problem Relation Age of Onset  . Diabetes Mother   . Stroke Mother   . Arthritis Father   . Hypertension Father   . Alcohol abuse Sister   . Cancer Sister     mesothelioma  . Heart disease Sister     cardiomegaly  . Hypertension Sister   . Hypertension Brother   . Polycystic ovary syndrome Daughter   . Heart disease Maternal Grandmother   . Heart disease Maternal Grandfather     SOCIAL HISTORY: History   Social History  . Marital Status: Married    Spouse Name: N/A    Number of Children: 1  . Years of Education: N/A   Occupational History  . Not on file.   Social History Main Topics  . Smoking status: Never Smoker   . Smokeless tobacco: Not on file  . Alcohol Use: Yes     Comment: Occasional  . Drug Use: No  . Sexual Activity: Yes   Other Topics Concern  . Not on file   Social History Narrative  . No narrative on file     PHYSICAL EXAM  Filed Vitals:   04/27/13 1435  BP: 146/91  Pulse: 98  Weight: 237 lb (107.502 kg)   Body mass index is 37.11 kg/(m^2).  Physical Exam  General: Pleasant, in no distress.  Afebrile.   Head: nontraumatic, nontender Ears, Nose and Throat:  Hearing is normal.  Neck: supple without bruit Respiratory: clear to auscultation Cardiovascular: no murmur or gallop Musculoskeletal: No deformities  Neurologic Exam  Mental Status: Awake, alert and oriented to time, place and person.  Speech and language appear normal.   Cranial Nerves: Eye movements are  full range without nystagmus.  Fundi revealed sharp disc margins without papilledema.  Visual fields are full to confrontational testing.  Face is symmetric without weakness.  Tongue is midline. Hearing is normal. no sensory loss on face. Motor: reveals no upper or lower extremity drift.  Symmetric and equal strength in all four extremities.  No focal weakness. Sensory: Touch and pinprick sensations are normal.   Coordination: normal Gait and Station: steady gait including tandem walking, able to heel and toe walk without difficulty Reflexes: Deep tendon reflexes are 2+ symmetric.  Plantars are downgoing.    DIAGNOSTIC DATA (LABS, IMAGING, TESTING) - I reviewed patient records, labs, notes, testing and imaging myself where available.  Lab Results  Component Value Date   WBC 7.3 04/13/2013   HGB 13.2 04/13/2013   HCT 39.2 04/13/2013   MCV 89.7 04/13/2013   PLT 266 04/13/2013      Component Value Date/Time   NA 142 04/13/2013 1416   K 4.4 04/13/2013 1416   CL 105 04/13/2013 1416   CO2 25 04/13/2013 1416   GLUCOSE 94 04/13/2013 1416   BUN 20 04/13/2013 1416   CREATININE 0.98 04/13/2013 1416   CALCIUM 9.1 04/13/2013 1416   PROT 7.1 04/13/2013 1416   ALBUMIN 4.1 04/13/2013 1416   AST 20 04/13/2013 1416   ALT 17 04/13/2013 1416   ALKPHOS 73 04/13/2013 1416   BILITOT 0.4 04/13/2013 1416   Lab Results  Component Value Date   HGBA1C 6.7* 04/13/2013   Lab Results  Component Value Date   TSH 1.161 04/13/2013    ASSESSMENT AND PLAN 58 year old Caucasian female with 3 years history of left-sided headache likely cluster headache syndrome which has worsened in the last three months.  Headaches are  more persistent with photophobia and occasional nausea, neuroulogic exam normal.  Will taper off Gabapentin (not effective) and start Verapamil.    Start Rizatriptan dissolving tablets for acute headache as soon as headache is starting.  May repeat x 1 in 2 hours if needed. Start Verapamil SR 240 mg tablets daily in the morning as a preventative for cluster headaches. Wean off Gabapentin as it is not helpful in preventing your headache. Follow up in 3-4 months.  Meds ordered this encounter  Medications  . verapamil (CALAN-SR) 240 MG CR tablet    Sig: Take 1 tablet (240 mg total) by mouth at bedtime.    Dispense:  30 tablet    Refill:  2    Order Specific Question:  Supervising Provider    Answer:  Leonie Man, PRAMOD S [[id number]]  . rizatriptan (MAXALT-MLT) 10 MG disintegrating tablet    Sig: Take 1 tablet (10 mg total) by mouth as needed for migraine. May repeat in 2 hours if needed    Dispense:  10 tablet    Refill:  11    Order Specific Question:  Supervising Provider    Answer:     Philmore Pali, MSN, NP-C 04/28/2013, 11:07 AM [facility] [address], [place], [place] [address] [phone]  Note: This document was prepared with digital dictation and possible smart phrase technology. Any transcriptional errors that result from this process are unintentional.

## 2013-04-27 NOTE — Patient Instructions (Signed)
Start Rizatriptan dissolving tablets for acute headache as soon as headache is starting.  May repeat x 1 in 2 hours if needed.  Start Verapamil SR tablets daily in the morning as a preventative.  Stop Gabapentin as it is not helpful in preventing your headache.  Follow up in 3-4 months.

## 2013-04-28 ENCOUNTER — Encounter: Payer: Self-pay | Admitting: Nurse Practitioner

## 2013-05-02 ENCOUNTER — Telehealth: Payer: Self-pay | Admitting: *Deleted

## 2013-05-02 NOTE — Telephone Encounter (Signed)
Explained to patient that results were not ready as of yet, once physician reviews, will contact

## 2013-05-04 ENCOUNTER — Encounter: Payer: Self-pay | Admitting: Medical

## 2013-05-09 ENCOUNTER — Telehealth: Payer: Self-pay

## 2013-05-09 ENCOUNTER — Ambulatory Visit
Admission: RE | Admit: 2013-05-09 | Discharge: 2013-05-09 | Disposition: A | Discharge status: Home / Self Care | Payer: Medicare Other | Source: Ambulatory Visit | Attending: Medical | Admitting: Medical

## 2013-05-09 ENCOUNTER — Ambulatory Visit: Payer: Medicare Other | Admitting: Dietician

## 2013-05-09 ENCOUNTER — Other Ambulatory Visit: Payer: Self-pay | Admitting: Nurse Practitioner

## 2013-05-09 DIAGNOSIS — R0989 Other specified symptoms and signs involving the circulatory and respiratory systems: Secondary | ICD-10-CM

## 2013-05-09 DIAGNOSIS — R7301 Impaired fasting glucose: Secondary | ICD-10-CM

## 2013-05-09 DIAGNOSIS — R609 Edema, unspecified: Secondary | ICD-10-CM

## 2013-05-09 DIAGNOSIS — E669 Obesity, unspecified: Secondary | ICD-10-CM

## 2013-05-09 MED ORDER — RIZATRIPTAN BENZOATE 10 MG PO TBDP: 10.0000 mg | ORAL_TABLET | ORAL | Status: DC | PRN

## 2013-05-09 NOTE — Telephone Encounter (Signed)
We can increase the amount to 20 per month, (she will have to pay out of pocket), hopefully she will not need as many when Verapamil is in her system a little longer.

## 2013-05-09 NOTE — Telephone Encounter (Signed)
[employer] sent [place] a fax saying the patient told them she has to take Maxalt every single day, so she wants her Rx increased to #30 tabs per month. (Ins will not cover this amount).  I called the patient.  Asked her how she is taking the med.  Says she has 3-4 headaches, sometimes more each week.  Indicates she usually takes one tab, but sometimes takes 2.   Advised her to use caution, as this med is not recommended for daily use.  Says she was upset when the pharmacy told her she could not have more tablets until next refill is due because ins wouldn't pay.  (Last filled on 02/06).  She feels she needs a Rx for more Maxalt or wants something else prescribed. I confirmed the patient is taking Calan.  Please advise.  Thank you.

## 2013-05-15 ENCOUNTER — Encounter: Payer: Self-pay | Admitting: Nurse Practitioner

## 2013-05-25 ENCOUNTER — Encounter: Payer: Self-pay | Admitting: Medical

## 2013-07-19 ENCOUNTER — Other Ambulatory Visit: Payer: Self-pay

## 2013-07-19 MED ORDER — VERAPAMIL HCL ER 240 MG PO TBCR: 240.0000 mg | EXTENDED_RELEASE_TABLET | Freq: Every day | ORAL | Status: DC

## 2013-07-27 ENCOUNTER — Telehealth: Payer: Self-pay | Admitting: Medical

## 2013-07-27 MED ORDER — EPINEPHRINE 0.3 MG/0.3ML IJ SOAJ: 0.3000 mg | Freq: Once | INTRAMUSCULAR | Status: DC

## 2013-07-27 NOTE — Telephone Encounter (Signed)
Patient going on vacation and needs refill on [place]

## 2013-07-31 ENCOUNTER — Ambulatory Visit (INDEPENDENT_AMBULATORY_CARE_PROVIDER_SITE_OTHER): Payer: Medicare Other | Admitting: Medical

## 2013-07-31 ENCOUNTER — Other Ambulatory Visit: Payer: Self-pay | Admitting: Dermatology

## 2013-07-31 VITALS — BP 132/82 | HR 68 | Temp 97.9°F | Resp 16 | Wt 235.0 lb

## 2013-07-31 DIAGNOSIS — R21 Rash and other nonspecific skin eruption: Secondary | ICD-10-CM

## 2013-07-31 DIAGNOSIS — I872 Venous insufficiency (chronic) (peripheral): Secondary | ICD-10-CM

## 2013-07-31 DIAGNOSIS — D698 Other specified hemorrhagic conditions: Secondary | ICD-10-CM

## 2013-07-31 NOTE — Progress Notes (Signed)
Subjective: Here for rash on legs.  She notes history of getting similar rash when she is on her feet for long periods walking or if she is sitting for prolonged period time .  However what is different about this rash is that she has splotches on her thighs bilaterally and she doesn't always get this.  She normally gets redness on the lower medial legs around the ankle and lower leg when she is been sitting or walking for long periods.  She currently has redness of the lower legs and the anterior bilateral thighs.  She put emu cream on the thighs.    She does wear knee-high compress on a somewhat regular basis.  Normally if she does nothing to the rash it will go away in 2-3 days.  Review of systems as in subjective  Objective General: WD/WN, nad Skin: bilat medial ankles with erythema that stops where the shoes were compressing the ankles.  Rash doesn't include the feet.  She has spider veins of lower legs and feet.  There are blanching splotchy areas of flat erythema of anterior bilat thighs suggestive of capillary fragility Ext: 1+ nonpitting edema Pulses 1+ pedal pulses   Assessment: Encounter Diagnoses  Name Primary?  . Chronic venous insufficiency Yes  . Capillary fragility   . Rash and nonspecific skin eruption    Plan: Discussed case with Dr. Redmond School and he examined as well.  Advised leg elevation, compression hose regularly.    Referred to dermatology for further input on treatment of the splotches on the thighs that seem to be more of a capillary fragility issue, but the rash is tender.

## 2013-08-01 ENCOUNTER — Other Ambulatory Visit: Payer: Self-pay | Admitting: Medical

## 2013-08-01 ENCOUNTER — Telehealth: Payer: Self-pay | Admitting: Family Medicine

## 2013-08-01 MED ORDER — HYDROCHLOROTHIAZIDE 25 MG PO TABS: 25.0000 mg | ORAL_TABLET | ORAL | Status: DC | PRN

## 2013-08-01 MED ORDER — FUROSEMIDE 20 MG PO TABS: 20.0000 mg | ORAL_TABLET | Freq: Every day | ORAL | Status: DC

## 2013-08-01 NOTE — Telephone Encounter (Signed)
Chart shows that she is on hydrochlorothiazide fluid pill. If she is taking this daily, then stop this and change to Lasix 20 mg once daily and see if this works better.  Did the dermatologist have her do anything topically to her upper legs?

## 2013-08-01 NOTE — Telephone Encounter (Signed)
Pt needed a refill on hctz so i sent med to pharmacy and dermatologist did not give her anything. He cut off a spot and will call her in a few days to give the results.

## 2013-08-01 NOTE — Telephone Encounter (Signed)
Pt called and states the dermatologist states pt needs to be on a fluid pill.  Pt used to be on one long time ago when she saw Dr Charleston Poot.  But she hasn't been on it in a long time and doesn't remember the name.  Please call rx in to [facility]  Pt ph [phone]

## 2013-08-31 ENCOUNTER — Encounter (INDEPENDENT_AMBULATORY_CARE_PROVIDER_SITE_OTHER): Payer: Self-pay

## 2013-08-31 ENCOUNTER — Other Ambulatory Visit: Payer: Self-pay | Admitting: Nurse Practitioner

## 2013-08-31 ENCOUNTER — Encounter: Payer: Self-pay | Admitting: Nurse Practitioner

## 2013-08-31 ENCOUNTER — Ambulatory Visit (INDEPENDENT_AMBULATORY_CARE_PROVIDER_SITE_OTHER): Payer: Medicare Other | Admitting: Nurse Practitioner

## 2013-08-31 VITALS — BP 145/79 | HR 64 | Wt 238.4 lb

## 2013-08-31 DIAGNOSIS — G44009 Cluster headache syndrome, unspecified, not intractable: Secondary | ICD-10-CM

## 2013-08-31 MED ORDER — VERAPAMIL HCL ER 240 MG PO TBCR: 240.0000 mg | EXTENDED_RELEASE_TABLET | Freq: Every day | ORAL | Status: DC

## 2013-08-31 NOTE — Patient Instructions (Signed)
Continue Rizatriptan dissolving tablets for acute headache as soon as headache is starting. May repeat x 1 in 2 hours if needed.  Continue Verapamil SR 240 mg tablets daily in the morning as a preventative for cluster headaches.  Follow up in 6 months, sooner as needed.

## 2013-08-31 NOTE — Progress Notes (Signed)
PATIENT: Emma [name] DOB: 1955-07-15  REASON FOR VISIT: routine follow up for cluster headache HISTORY FROM: patient  HISTORY OF PRESENT ILLNESS: 58 year old Caucasian female who previously seen Dr. Leonie [name] approximately 3 years ago for left-sided head pain who was lost to followup at that time with persistent left-sided head pain which has worsened in the last year. She taking Topamax 50 mg twice a day without relief. She reports these headaches are sharp , shooting and start behind left eye and shoot to left sideof face and neck may last up to 2 days where she remains in the bed. She has to be in the dark for relief. During that time her mother died.  11/08/11 (JM): Returns for follow up visit since 07/27/11. Reports she is having headaches 3 days per week, was 2 days per week. She has been taking Topamax 100mg  BID, tolerating well but no relief. She has also been taking gabapentin 100mg  daily, denies any side effects she was concerned about having seizures and not being able to stop the medication. She can not relate any triggers to her headaches. Today she has a severe headache on the left side that started on Sunday and has not had any relief. Reports photophobia and intermittent nausea. She has tried Maxalt in the past but stopped secondary to cost, this was prior to going generic.  She also has history of degenerative disc disease.  04/27/13 (LL): She comes in for worsening headaches. Last here in mid 2013. She has continued to take Gabapentin since that time. But sees no benefit. Headaches are always left sided, temporal and parietal, vascular type which regularly wake her up at 4 am. They are accompanied with left eye injection, without tearing. She often has to stay in dark room most of the day with headache, and nausea often is present. She endorses poor sleep, even with alprazolam, soma, mirapex and lunesta nightly.  08/31/13 (LL): Emma [name] comes in for follow up for cluster headaches.  She  has been doing much better on Verapamil as preventative.  She is sleeping better as well since starting this which is helping as well.  She has not had any severe headaches in 3 months.  She has not had to take Rizatriptan in over a month.  Since last here her [name] died suddenly from a PE. Her husband is also seen in this practice for mild cognitive impairment.  Review of Systems  Out of a complete 14 system review, the patient complains of only the following symptoms, and all other reviewed systems are negative.  Constitutional: Fatigue Unexpected weight change Cardiovascular: Swelling in legs Eyes: Eye redness, light sensitivity  Sleep: Sleepiness Restless legs, Insomnia  Musculoskeletal: Joint pain Joint swelling Cramps Aching muscles Psychiatric: Agitation Neurologic: Headache   ALLERGIES: Allergies  Allergen Reactions  . Bee Venom Anaphylaxis  . Codeine     REACTION: vomiting    HOME MEDICATIONS: Outpatient Prescriptions Prior to Visit  Medication Sig Dispense Refill  . ALPRAZolam (XANAX) 0.5 MG tablet Take 0.5 mg by mouth at bedtime as needed. 2 to 3 tablets per night      . carisoprodol (SOMA) 350 MG tablet Take 350 mg by mouth at bedtime.       Marland Kitchen EPINEPHrine (EPIPEN) 0.3 mg/0.3 mL IJ SOAJ injection Inject 0.3 mLs (0.3 mg total) into the muscle once.  1 Device  1  . eszopiclone (LUNESTA) 2 MG TABS tablet Take 3 mg by mouth at bedtime as needed for sleep. Take  immediately before bedtime      . etodolac (LODINE) 400 MG tablet Take 400 mg by mouth as needed.       . fluticasone (FLONASE) 50 MCG/ACT nasal spray Place 2 sprays into both nostrils daily.  16 g  2  . furosemide (LASIX) 20 MG tablet Take 1 tablet (20 mg total) by mouth daily.  30 tablet  3  . gabapentin (NEURONTIN) 300 MG capsule Take 1 capsule (300 mg total) by mouth 3 (three) times daily.  90 capsule  11  . hydrochlorothiazide (HYDRODIURIL) 25 MG tablet Take 1 tablet (25 mg total) by mouth as needed.  30 tablet  1  .  HYDROcodone-acetaminophen (NORCO) 7.5-325 MG per tablet Take 1 tablet by mouth as needed.       . lubiprostone (AMITIZA) 24 MCG capsule Take 1 capsule (24 mcg total) by mouth as needed.  30 capsule  3  . metFORMIN (GLUCOPHAGE) 500 MG tablet Take 1 tablet (500 mg total) by mouth 2 (two) times daily with a meal. 1 tablet po BID for diabetes  60 tablet  3  . pramipexole (MIRAPEX) 0.125 MG tablet Take 1 tablet (0.125 mg total) by mouth at bedtime.  90 tablet  0  . rizatriptan (MAXALT-MLT) 10 MG disintegrating tablet Take 1 tablet (10 mg total) by mouth as needed for migraine. May repeat in 2 hours if needed  20 tablet  11  . verapamil (CALAN-SR) 240 MG CR tablet Take 1 tablet (240 mg total) by mouth at bedtime.  30 tablet  2   No facility-administered medications prior to visit.   PHYSICAL EXAM  Filed Vitals:   08/31/13 1408  BP: 145/79  Pulse: 64  Weight: 238 lb 6.4 oz (108.138 kg)   Body mass index is 37.33 kg/(m^2). No exam data present  Physical Exam  General: Pleasant, in no distress.  Head: nontraumatic, nontender  Ears, Nose and Throat: Hearing is normal.  Neck: supple without bruit  Respiratory: clear to auscultation  Cardiovascular: no murmur or gallop   Neurologic Exam  Mental Status: Awake, alert and oriented to time, place and person. Speech and language appear normal.  Cranial Nerves: Eye movements are full range without nystagmus. Fundi revealed sharp disc margins without papilledema. Visual fields are full to confrontational testing. Face is symmetric without weakness. Tongue is midline. Hearing is normal. no sensory loss on face.  Motor: reveals no upper or lower extremity drift. Symmetric and equal strength in all four extremities. No focal weakness.  Sensory: Touch and pinprick sensations are normal.  Coordination: normal  Gait and Station: steady gait including tandem walking, able to heel and toe walk without difficulty  Reflexes: Deep tendon reflexes are 2+  symmetric.   ASSESSMENT AND PLAN  58 year old Caucasian female with 3 years history of left-sided headache likely cluster headache syndrome with neuroulogic exam normal. She has done well since starting Verapamil, now down to 1-2 non-severe headaches per month.  Continue Rizatriptan dissolving tablets for acute headache as soon as headache is starting. May repeat x 1 in 2 hours if needed.  Continue Verapamil SR 240 mg tablets daily at hs as a preventative for cluster headaches.  Follow up in 6 months, sooner as needed.  Meds ordered this encounter  Medications  . verapamil (CALAN-SR) 240 MG CR tablet    Sig: Take 1 tablet (240 mg total) by mouth at bedtime.    Dispense:  30 tablet    Refill:  5    Order Specific  Question:  Supervising Provider    Answer:  Garvin Fila [[id number]]   Return in about 6 months (around 03/02/2014).  Philmore Pali, MSN, NP-C 08/31/2013, 4:05 PM [facility] [address] [place], [place] [address] [phone]  Note: This document was prepared with digital dictation and possible smart phrase technology. Any transcriptional errors that result from this process are unintentional.

## 2013-09-03 NOTE — Progress Notes (Signed)
I agree with above 

## 2013-10-11 LAB — HM MAMMOGRAPHY

## 2013-10-12 ENCOUNTER — Encounter: Payer: Self-pay | Admitting: Internal Medicine

## 2013-10-23 ENCOUNTER — Telehealth: Payer: Self-pay | Admitting: Neurology

## 2013-10-23 ENCOUNTER — Encounter: Payer: Self-pay | Admitting: Neurology

## 2013-10-23 NOTE — Telephone Encounter (Signed)
Left message for patient regarding rescheduling 03/26/14 appointment per Dr. Clydene Fake schedule, printed and mailed letter with new appointment time.

## 2013-10-30 ENCOUNTER — Other Ambulatory Visit: Payer: Self-pay | Admitting: Family Medicine

## 2013-11-09 ENCOUNTER — Ambulatory Visit (INDEPENDENT_AMBULATORY_CARE_PROVIDER_SITE_OTHER): Payer: Medicare Other | Admitting: Family Medicine

## 2013-11-09 VITALS — BP 134/88 | HR 77 | Wt 228.0 lb

## 2013-11-09 DIAGNOSIS — L259 Unspecified contact dermatitis, unspecified cause: Secondary | ICD-10-CM

## 2013-11-09 MED ORDER — TRIAMCINOLONE ACETONIDE 0.1 % EX CREA: 1.0000 "application " | TOPICAL_CREAM | Freq: Two times a day (BID) | CUTANEOUS | Status: DC

## 2013-11-09 NOTE — Patient Instructions (Signed)

## 2013-11-10 NOTE — Progress Notes (Signed)
   Subjective:    Patient ID: Emma [name], female    DOB: 07-May-1955, 58 y.o.   MRN: [id number]  HPI She complains of a rash on her abdomen. She developed this actually was outside doing yard work several days ago.   Review of Systems     Objective:   Physical Exam Erythematous somewhat linear appearing rash present on the left mid abdominal area. No vesicles were noted.       Assessment & Plan:  Contact dermatitis - Plan: triamcinolone cream (KENALOG) 0.1 %  recommend cool compresses, cortisone cream and Benadryl. Discussed contact dermatitis in terms of spread.

## 2013-12-11 NOTE — Telephone Encounter (Signed)
Please review clinic notes. CD

## 2013-12-17 ENCOUNTER — Ambulatory Visit: Payer: Medicare Other | Admitting: Medical

## 2014-01-07 ENCOUNTER — Other Ambulatory Visit: Payer: Self-pay | Admitting: Family Medicine

## 2014-01-11 ENCOUNTER — Ambulatory Visit (INDEPENDENT_AMBULATORY_CARE_PROVIDER_SITE_OTHER): Payer: Medicare Other | Admitting: Medical

## 2014-01-11 ENCOUNTER — Encounter: Payer: Self-pay | Admitting: Medical

## 2014-01-11 VITALS — BP 130/70 | HR 68 | Temp 97.8°F | Wt 229.0 lb

## 2014-01-11 DIAGNOSIS — E1165 Type 2 diabetes mellitus with hyperglycemia: Secondary | ICD-10-CM

## 2014-01-11 DIAGNOSIS — R609 Edema, unspecified: Secondary | ICD-10-CM

## 2014-01-11 DIAGNOSIS — Z23 Encounter for immunization: Secondary | ICD-10-CM

## 2014-01-11 DIAGNOSIS — R202 Paresthesia of skin: Secondary | ICD-10-CM

## 2014-01-11 DIAGNOSIS — R011 Cardiac murmur, unspecified: Secondary | ICD-10-CM

## 2014-01-11 DIAGNOSIS — IMO0002 Reserved for concepts with insufficient information to code with codable children: Secondary | ICD-10-CM

## 2014-01-11 DIAGNOSIS — I872 Venous insufficiency (chronic) (peripheral): Secondary | ICD-10-CM

## 2014-01-11 DIAGNOSIS — R0602 Shortness of breath: Secondary | ICD-10-CM

## 2014-01-11 LAB — CBC WITH DIFFERENTIAL/PLATELET
Basophils Absolute: 0.1 10*3/uL (ref 0.0–0.1)
Basophils Relative: 1 % (ref 0–1)
Eosinophils Absolute: 0.4 10*3/uL (ref 0.0–0.7)
Eosinophils Relative: 5 % (ref 0–5)
HCT: 41.5 % (ref 36.0–46.0)
Hemoglobin: 14.4 g/dL (ref 12.0–15.0)
Lymphocytes Relative: 26 % (ref 12–46)
Lymphs Abs: 2.1 10*3/uL (ref 0.7–4.0)
MCH: 31.5 pg (ref 26.0–34.0)
MCHC: 34.7 g/dL (ref 30.0–36.0)
MCV: 90.8 fL (ref 78.0–100.0)
Monocytes Absolute: 0.6 10*3/uL (ref 0.1–1.0)
Monocytes Relative: 7 % (ref 3–12)
Neutro Abs: 4.8 10*3/uL (ref 1.7–7.7)
Neutrophils Relative %: 61 % (ref 43–77)
Platelets: 354 10*3/uL (ref 150–400)
RBC: 4.57 MIL/uL (ref 3.87–5.11)
RDW: 13 % (ref 11.5–15.5)
WBC: 7.9 10*3/uL (ref 4.0–10.5)

## 2014-01-11 NOTE — Progress Notes (Signed)
Subjective: Here for c/o feet and ankle swelling x 3 years, worse in the last year.  Has been here for same prior.  Hasn't been wearing compression hose due to RA Feels like bee sting, burning stabbing pains in feet.  Sister died in 17-Jun-2022 of PE, hx/o clots in family.  Sister had ulcerations of legs, and she is worried she has same.  Has occasional SOB.  No chest pain, palpations, no syncope.  She has been taking metformin since diagnosis of diabetes back in the summer.  No other new c/o.     Past Medical History  Diagnosis Date  . Rheumatoid arthritis(714.0)   . Overweight(278.02) 06/11/2011  . Depression with anxiety 02/22/2008    Qualifier: Diagnosis of  By: Nolon Rod CMA (AAMA), Robin    . GERD (gastroesophageal reflux disease) 06/11/2011  . Colon polyp 06/11/2011    Most recent 04/23/08, no polyp--mild diverticulosis.  Repeat 10 yrs.  . Hypertension     Recently diagnosed  . Chronic constipation   . PUD (peptic ulcer disease) 04/23/08    Multiple antrum ulcers; h pylori neg  . Migraine syndrome   . Cluster headache syndrome 04/27/13     Objective: Filed Vitals:   01/11/14 1334  BP: 130/70  Pulse: 68  Temp: 97.8 F (36.6 C)    General appearance: alert, no distress, WD/WN HEENT: normocephalic, sclerae anicteric, TMs pearly, nares patent, no discharge or erythema, pharynx normal Oral cavity: MMM, no lesions Neck: supple, no lymphadenopathy, no thyromegaly, no masses, no JVD, no bruits Heart: 2/6 systolic, somewhat rumbling murmur heard best in left upper sternal border, otherwise RRR, normal S1, S2 Lungs: CTA bilaterally, no wheezes, rhonchi, or rales Abdomen: +bs, soft, RUQ surgical scar, non tender, non distended, no masses, no hepatomegaly, no splenomegaly Pulses: 1+ symmetric, upper and lower extremities, normal cap refill Ext: ankle edema 1+ bilat, nonpitting edema of legs, there are areas of spider veins of both feet, particularly medial feet, there are some scattered ares of  patchy erythema of bilat lower legs small spot that suggest hemosiderin staining, few small posterior calve varicosities Neuro: normal DTRs, nonfocal exam in general   Adult ECG Report  Indication: edema  Rate: 63bpm  Rhythm: normal sinus rhythm  QRS Axis: 35 degrees  PR Interval: 161ms  QRS Duration: 51ms  QTc: 441ms  Conduction Disturbances: none  Other Abnormalities: none  Patient's cardiac risk factors are: diabetes mellitus and hypertension.  EKG comparison: none  Narrative Interpretation: normal EKG    Assessment: Encounter Diagnoses  Name Primary?  . Edema Yes  . Chronic venous insufficiency   . Diabetes type 2, uncontrolled   . SOB (shortness of breath)   . Paresthesia of both legs   . Flu vaccine need     Plan: Reviewed EKG, labs today.  Discussed causes of edema.   Chronic venous insufficiency - advised routine exercise, low salt diet, and will call with labs DM type 2 - on metformin 500mg  once daily, labs today SOB - etiology unclear.   Consider echo Paresthesia, suggestive of peripheral neuropathy likely due to hx/o spinal surgery and lumbar spine disease.  She has gabapentin bottle at home 300mg  that she never used ,but was prescribed prior for "neuropathy" of legs and headaches.  Counseled on the influenza virus vaccine.  Vaccine information sheet given.  Influenza vaccine given after consent obtained.

## 2014-01-12 LAB — COMPREHENSIVE METABOLIC PANEL
ALT: 30 U/L (ref 0–35)
AST: 24 U/L (ref 0–37)
Albumin: 4.5 g/dL (ref 3.5–5.2)
Alkaline Phosphatase: 80 U/L (ref 39–117)
BUN: 15 mg/dL (ref 6–23)
CO2: 25 mEq/L (ref 19–32)
Calcium: 9.6 mg/dL (ref 8.4–10.5)
Chloride: 101 mEq/L (ref 96–112)
Creat: 1.13 mg/dL — ABNORMAL HIGH (ref 0.50–1.10)
Glucose, Bld: 142 mg/dL — ABNORMAL HIGH (ref 70–99)
Potassium: 3.9 mEq/L (ref 3.5–5.3)
Sodium: 141 mEq/L (ref 135–145)
Total Bilirubin: 0.4 mg/dL (ref 0.2–1.2)
Total Protein: 7.1 g/dL (ref 6.0–8.3)

## 2014-01-12 LAB — TSH: TSH: 1.554 u[IU]/mL (ref 0.350–4.500)

## 2014-01-12 LAB — HEMOGLOBIN A1C
Hgb A1c MFr Bld: 6.1 % — ABNORMAL HIGH (ref <5.7)
Mean Plasma Glucose: 128 mg/dL — ABNORMAL HIGH (ref <117)

## 2014-01-12 LAB — BRAIN NATRIURETIC PEPTIDE: Brain Natriuretic Peptide: 28.2 pg/mL (ref 0.0–100.0)

## 2014-01-14 ENCOUNTER — Other Ambulatory Visit: Payer: Self-pay | Admitting: Medical

## 2014-01-14 DIAGNOSIS — R7989 Other specified abnormal findings of blood chemistry: Secondary | ICD-10-CM

## 2014-01-14 MED ORDER — METFORMIN HCL 500 MG PO TABS: 500.0000 mg | ORAL_TABLET | Freq: Every day | ORAL | Status: DC

## 2014-01-14 NOTE — Addendum Note (Signed)
Addended by: Carlena Hurl on: 01/14/2014 08:10 AM   Modules accepted: Orders, Medications

## 2014-01-15 ENCOUNTER — Other Ambulatory Visit: Payer: Self-pay | Admitting: Family Medicine

## 2014-01-15 DIAGNOSIS — R011 Cardiac murmur, unspecified: Secondary | ICD-10-CM

## 2014-01-16 ENCOUNTER — Telehealth (HOSPITAL_COMMUNITY): Payer: Self-pay | Admitting: *Deleted

## 2014-01-23 ENCOUNTER — Ambulatory Visit (HOSPITAL_COMMUNITY)
Admission: RE | Admit: 2014-01-23 | Discharge: 2014-01-23 | Disposition: A | Discharge status: Home / Self Care | Payer: Medicare Other | Source: Ambulatory Visit | Attending: Medical | Admitting: Medical

## 2014-01-23 ENCOUNTER — Other Ambulatory Visit: Payer: Self-pay | Admitting: Medical

## 2014-01-23 DIAGNOSIS — E119 Type 2 diabetes mellitus without complications: Secondary | ICD-10-CM | POA: Diagnosis not present

## 2014-01-23 DIAGNOSIS — R011 Cardiac murmur, unspecified: Secondary | ICD-10-CM | POA: Diagnosis present

## 2014-01-23 DIAGNOSIS — I379 Nonrheumatic pulmonary valve disorder, unspecified: Secondary | ICD-10-CM

## 2014-01-23 MED ORDER — LISINOPRIL 10 MG PO TABS: 10.0000 mg | ORAL_TABLET | Freq: Every day | ORAL | Status: DC

## 2014-01-23 NOTE — Progress Notes (Signed)
2D Echo Performed 01/23/2014    Marygrace Drought, RCS

## 2014-01-24 ENCOUNTER — Telehealth: Payer: Self-pay | Admitting: Internal Medicine

## 2014-01-24 NOTE — Telephone Encounter (Signed)
Fax # is [phone]

## 2014-01-24 NOTE — Telephone Encounter (Signed)
Pt called and wants Echocardiogram sent to Dr. Luiz Blare at [facility] in [place], [place].

## 2014-01-24 NOTE — Telephone Encounter (Signed)
Spoke to patient in detail about her 2 D echo and lisinopril and HCTZ which she says she takes but it is not listed. She has many concerns. I have sent a long note to [place]. She will call you in am if hasnt heard from you.

## 2014-01-30 ENCOUNTER — Encounter: Payer: Self-pay | Admitting: Medical

## 2014-02-11 ENCOUNTER — Other Ambulatory Visit: Payer: Self-pay | Admitting: Neurology

## 2014-02-11 NOTE — Telephone Encounter (Signed)
OV note says:  Wean off Gabapentin as it is not helpful in preventing your headache

## 2014-02-13 ENCOUNTER — Telehealth: Payer: Self-pay | Admitting: Internal Medicine

## 2014-02-13 ENCOUNTER — Other Ambulatory Visit: Payer: Self-pay | Admitting: Medical

## 2014-02-13 MED ORDER — GABAPENTIN 300 MG PO CAPS: 300.0000 mg | ORAL_CAPSULE | Freq: Three times a day (TID) | ORAL | Status: DC

## 2014-02-13 NOTE — Telephone Encounter (Signed)
done

## 2014-02-13 NOTE — Telephone Encounter (Signed)
Pt would like a refill on gabapentin 300mg  #90 to [facility]. Pt uses this for her leg pain

## 2014-02-21 ENCOUNTER — Ambulatory Visit: Payer: Medicare Other | Admitting: Physical Therapy

## 2014-02-26 ENCOUNTER — Ambulatory Visit: Payer: Medicare Other | Attending: Neurosurgery | Admitting: Physical Therapy

## 2014-02-26 DIAGNOSIS — M545 Low back pain: Secondary | ICD-10-CM | POA: Diagnosis not present

## 2014-02-28 ENCOUNTER — Ambulatory Visit: Payer: Medicare Other | Admitting: Physical Therapy

## 2014-02-28 DIAGNOSIS — M545 Low back pain: Secondary | ICD-10-CM | POA: Diagnosis not present

## 2014-03-05 ENCOUNTER — Ambulatory Visit: Payer: Medicare Other | Admitting: Physical Therapy

## 2014-03-05 DIAGNOSIS — M545 Low back pain: Secondary | ICD-10-CM | POA: Diagnosis not present

## 2014-03-07 ENCOUNTER — Encounter: Payer: Medicare Other | Admitting: Physical Therapy

## 2014-03-11 ENCOUNTER — Encounter: Payer: Medicare Other | Admitting: Physical Therapy

## 2014-03-19 ENCOUNTER — Ambulatory Visit: Payer: Medicare Other | Admitting: Physical Therapy

## 2014-03-19 DIAGNOSIS — M545 Low back pain: Secondary | ICD-10-CM | POA: Diagnosis not present

## 2014-03-26 ENCOUNTER — Ambulatory Visit: Payer: Medicare Other | Attending: Neurosurgery | Admitting: Physical Therapy

## 2014-03-26 ENCOUNTER — Ambulatory Visit: Payer: Medicare Other | Admitting: Neurology

## 2014-03-26 DIAGNOSIS — M545 Low back pain: Secondary | ICD-10-CM | POA: Diagnosis not present

## 2014-03-27 ENCOUNTER — Telehealth: Payer: Self-pay | Admitting: Family Medicine

## 2014-03-27 NOTE — Telephone Encounter (Signed)
Pt called and wants a referral to the [facility] [phone] with Dr Nicholaus Bloom ASAP.  She has been having epidurals for her back pain and they either help from 2 hours to 3 days and then no relief.  Pt also wants to know if you see want to see her as a follow up to switching her meds and I told her yes if you changed her meds you would want to see her.  She still insisted that I ask you.

## 2014-03-27 NOTE — Telephone Encounter (Signed)
Yes plan f/u appt, can go ahead and start pain clinic referral

## 2014-03-28 ENCOUNTER — Encounter: Payer: Medicare Other | Admitting: Physical Therapy

## 2014-03-28 NOTE — Telephone Encounter (Signed)
LM to CB

## 2014-03-28 NOTE — Telephone Encounter (Signed)
Spoke with patient, called Pain Clinic and left message with Sharee Pimple to call us back with appointment for patient, made patient an appointment with Audelia Acton for f/up re back pain 04/03/14.

## 2014-04-02 ENCOUNTER — Ambulatory Visit: Payer: Medicare Other | Admitting: Physical Therapy

## 2014-04-02 DIAGNOSIS — M545 Low back pain: Secondary | ICD-10-CM | POA: Diagnosis not present

## 2014-04-03 ENCOUNTER — Telehealth: Payer: Self-pay | Admitting: Medical

## 2014-04-03 ENCOUNTER — Encounter: Payer: Self-pay | Admitting: Medical

## 2014-04-03 ENCOUNTER — Ambulatory Visit (INDEPENDENT_AMBULATORY_CARE_PROVIDER_SITE_OTHER): Payer: Medicare Other | Admitting: Medical

## 2014-04-03 VITALS — BP 100/70 | HR 61 | Temp 98.0°F | Resp 14 | Wt 226.0 lb

## 2014-04-03 DIAGNOSIS — L989 Disorder of the skin and subcutaneous tissue, unspecified: Secondary | ICD-10-CM

## 2014-04-03 DIAGNOSIS — I10 Essential (primary) hypertension: Secondary | ICD-10-CM

## 2014-04-03 DIAGNOSIS — I872 Venous insufficiency (chronic) (peripheral): Secondary | ICD-10-CM

## 2014-04-03 DIAGNOSIS — Z79899 Other long term (current) drug therapy: Secondary | ICD-10-CM

## 2014-04-03 DIAGNOSIS — R7301 Impaired fasting glucose: Secondary | ICD-10-CM

## 2014-04-03 DIAGNOSIS — K5900 Constipation, unspecified: Secondary | ICD-10-CM

## 2014-04-03 DIAGNOSIS — M549 Dorsalgia, unspecified: Secondary | ICD-10-CM

## 2014-04-03 DIAGNOSIS — G8929 Other chronic pain: Secondary | ICD-10-CM

## 2014-04-03 MED ORDER — METFORMIN HCL 500 MG PO TABS: 500.0000 mg | ORAL_TABLET | Freq: Every day | ORAL | Status: DC

## 2014-04-03 MED ORDER — LINACLOTIDE 290 MCG PO CAPS: 290.0000 ug | ORAL_CAPSULE | Freq: Every day | ORAL | Status: DC

## 2014-04-03 MED ORDER — METFORMIN HCL 500 MG PO TABS: ORAL_TABLET | ORAL | Status: DC

## 2014-04-03 NOTE — Telephone Encounter (Signed)
Refer to dermatology 

## 2014-04-03 NOTE — Progress Notes (Signed)
Subjective: She is here today for recheck on several issues from last visit  Edema is much improved from last visit, she stop hydrochlorothiazide, using Lasix daily  Hypertension-she has continue verapamil and begin lisinopril  Chronic back pain- She has a history chronic back pain, multiple prior back surgeries, rheumatoid arthritis.  In pain all the time despite current medications. Going to rehab/therapy twice weekly.   Just saw back specialist last week for 2nd EDSI and no improvement.  Saw rheumatology this week, given her prior problems with liver inflammation with methotrexate and despite current medications rheumatology doesn't feel they can do much more for her per her report  She has skin lesion she wants me to look   amitiza is not working for constipation  Here for recheck on elevated calcium that was elevated on recent lab   Objective: BP 100/70 mmHg  Pulse 61  Temp(Src) 98 F (36.7 C) (Oral)  Resp 14  Wt 226 lb (102.513 kg)  Wt Readings from Last 3 Encounters:  04/03/14 226 lb (102.513 kg)  01/11/14 229 lb (103.874 kg)  11/09/13 228 lb (103.42 kg)   BP Readings from Last 3 Encounters:  04/03/14 100/70  01/11/14 130/70  11/09/13 134/88   General appearance: alert, no distress, WD/WN Neck: supple, no lymphadenopathy, no thyromegaly, no masses, no JVD, no bruits Heart: 2/6 systolic, somewhat rumbling murmur heard best in left upper sternal border, otherwise RRR, normal S1, S2 Lungs: CTA bilaterally, no wheezes, rhonchi, or rales Abdomen: +bs, soft, RUQ surgical scar, non tender, non distended, no masses, no hepatomegaly, no splenomegaly Pulses: 1+ symmetric, upper and lower extremities, normal cap refill Ext:no edema Skin: left lower anterior leg with 1.2 cm flat light brown lesion along lower 1/3 of leg, just inferior to this with smaller brown round 12mm lesion, posterior leg distal 1/3 with oval red/brown patchy lesion, flat Back: lumbar surgical scar, tender  lumbar region, pain with ROM   Assessment: Encounter Diagnoses  Name Primary?  . Chronic back pain Yes  . High risk medication use   . Essential hypertension   . Serum calcium elevated   . Chronic venous insufficiency   . Impaired fasting blood sugar   . Constipation, unspecified constipation type    Plan: Chronic back pain-continue Lodine, increase gabapentin 300 mg to 3 times a day, c/t soma and hydrocodone prn per back specialist.   We have made referral to pain clinic  Hypertension-we started lisinopril last visit, continue Lasix daily, continue verapamil daily, reviewed her recent echocardiogram showing LVH  Serum calcium elevated on recent lab that was sent over by another clinic, recheck calcium today  Impaired fasting glucose-reviewed recent labs, continue metformin once daily healthy diet  Constipation-stop Amitiza, begin Linzess trial  Skin lesion - referral to dermatology

## 2014-04-03 NOTE — Telephone Encounter (Signed)
I fax over a new referral to [facility] to see a pain management doctor at there office. [address] [place], [place] [address] [phone]   Fax # [phone] They will review the records and call the patient.

## 2014-04-03 NOTE — Telephone Encounter (Signed)
Patient doesn't want to see Dr. Hardin Negus anymore.

## 2014-04-03 NOTE — Patient Instructions (Signed)
Recommendations today  Stop Amitiza, begin Linzess once daily for constipation  Increase gabapentin 300 mg to 3 times daily  We will call lab results  We'll refer to dermatology and pain clinic  Continue rest of medications as usual

## 2014-04-03 NOTE — Telephone Encounter (Signed)
I  left a message on the voice mail for Sharee Pimple [phone] to call back so I could schedule an appointment for the patient. [facility] [address] [place], [place]

## 2014-04-03 NOTE — Telephone Encounter (Signed)
Appointment to see dermatology on 04/23/14 @ 1000 am with [facility] [phone]

## 2014-04-03 NOTE — Telephone Encounter (Signed)
Per Dorothea Ogle PA the patient should hold off on shingles vaccine until after she stops the steriod injections.

## 2014-04-04 ENCOUNTER — Encounter: Payer: Medicare Other | Admitting: Physical Therapy

## 2014-04-04 LAB — PTH, INTACT AND CALCIUM
Calcium: 9.1 mg/dL (ref 8.4–10.5)
PTH: 81 pg/mL — ABNORMAL HIGH (ref 14–64)

## 2014-04-04 NOTE — Telephone Encounter (Signed)
Advised patient Emma [name] doubts one dose of Linzess , patient will try rx at a different time of day and report response.

## 2014-04-04 NOTE — Telephone Encounter (Signed)
She just started the [place] so I doubt that one day worth would do this.  Either hold off for a week and restart or give it more time

## 2014-04-04 NOTE — Telephone Encounter (Signed)
LM to CB [facility] 

## 2014-04-04 NOTE — Telephone Encounter (Signed)
Reported all to patient. She will call back next week if she hasn't heard from [facility]. She is having difficulty tolerating the Linzess. Diarrhea with incontinence of stool. Please advise

## 2014-04-09 ENCOUNTER — Ambulatory Visit: Payer: Medicare Other | Admitting: Physical Therapy

## 2014-04-09 DIAGNOSIS — M545 Low back pain: Secondary | ICD-10-CM | POA: Diagnosis not present

## 2014-04-11 ENCOUNTER — Ambulatory Visit: Payer: Medicare Other | Admitting: Physical Therapy

## 2014-04-11 DIAGNOSIS — M545 Low back pain: Secondary | ICD-10-CM | POA: Diagnosis not present

## 2014-04-16 ENCOUNTER — Encounter: Payer: Medicare Other | Admitting: Physical Therapy

## 2014-04-18 ENCOUNTER — Ambulatory Visit: Payer: Medicare Other | Admitting: Physical Therapy

## 2014-04-18 DIAGNOSIS — M545 Low back pain: Secondary | ICD-10-CM | POA: Diagnosis not present

## 2014-04-25 ENCOUNTER — Ambulatory Visit: Payer: Medicare Other | Attending: Neurosurgery | Admitting: Physical Therapy

## 2014-04-25 DIAGNOSIS — M545 Low back pain: Secondary | ICD-10-CM | POA: Insufficient documentation

## 2014-05-09 ENCOUNTER — Telehealth: Payer: Self-pay | Admitting: Medical

## 2014-05-09 ENCOUNTER — Ambulatory Visit (INDEPENDENT_AMBULATORY_CARE_PROVIDER_SITE_OTHER): Payer: Medicare Other | Admitting: Medical

## 2014-05-09 ENCOUNTER — Encounter: Payer: Self-pay | Admitting: Medical

## 2014-05-09 DIAGNOSIS — R899 Unspecified abnormal finding in specimens from other organs, systems and tissues: Secondary | ICD-10-CM | POA: Diagnosis not present

## 2014-05-09 DIAGNOSIS — M8580 Other specified disorders of bone density and structure, unspecified site: Secondary | ICD-10-CM | POA: Diagnosis not present

## 2014-05-09 DIAGNOSIS — E213 Hyperparathyroidism, unspecified: Secondary | ICD-10-CM | POA: Diagnosis not present

## 2014-05-09 DIAGNOSIS — K5900 Constipation, unspecified: Secondary | ICD-10-CM

## 2014-05-09 DIAGNOSIS — M069 Rheumatoid arthritis, unspecified: Secondary | ICD-10-CM | POA: Diagnosis not present

## 2014-05-09 LAB — PHOSPHORUS: Phosphorus: 5 mg/dL — ABNORMAL HIGH (ref 2.3–4.6)

## 2014-05-09 MED ORDER — NALOXEGOL OXALATE 25 MG PO TABS: 1.0000 | ORAL_TABLET | Freq: Every day | ORAL | Status: DC

## 2014-05-09 NOTE — Telephone Encounter (Signed)
Set up bone density exam

## 2014-05-09 NOTE — Progress Notes (Signed)
Subjective: Here for follow-up on abnormal labs from last visit including elevated PTH and calcium.  Her main concern continues to be constipation not improved with an Amitiza or Linzess. Has a bowel movement about every 3 days if lucky.   The least bit of hydrocodone she takes from her pain doctor she gets constipated.  History of osteopenia, last bone density scan several years ago, hasn't used Reclast in several years due to dental work she's had done. Given prior calcium deposits in her spine and breast her chiropractor told her not take calcium, so she is not taking extra calcium or vitamin D at this time  No other aggravating or relieving factors. No other complaint.  Past Medical History  Diagnosis Date  . Rheumatoid arthritis(714.0)   . Overweight(278.02) 06/11/2011  . Depression with anxiety 02/22/2008    Qualifier: Diagnosis of  By: Nolon Rod CMA (AAMA), Robin    . GERD (gastroesophageal reflux disease) 06/11/2011  . Colon polyp 06/11/2011    Most recent 04/23/08, no polyp--mild diverticulosis.  Repeat 10 yrs.  . Hypertension     Recently diagnosed  . Chronic constipation   . PUD (peptic ulcer disease) 04/23/08    Multiple antrum ulcers; h pylori neg  . Migraine syndrome   . Cluster headache syndrome 04/27/13   ROS as in subjective  Objective: Wt 225 lb (102.059 kg)  Gen: wd, wn, nad Otherwise not examined    Assessment: Encounter Diagnoses  Name Primary?  . Serum calcium elevated Yes  . Osteopenia   . Abnormal laboratory test   . Rheumatoid arthritis   . Hyperparathyroidism   . Constipation, unspecified constipation type     Plan: Elevated calcium and PTH - repeat labs today Of note, can't find diagnosis code to cover Vit D so lab not done Osteopenia - given prior dental issues and oral surgery, hasn't been on Reclast in a while, repeat bone density.  Last scan 2011.   Constipation - begin trial of Movantik. Failed Amitiza, Linzess, OTC medications, stool  softeners, and other.

## 2014-05-10 ENCOUNTER — Telehealth: Payer: Self-pay | Admitting: Medical

## 2014-05-10 LAB — PTH, INTACT AND CALCIUM
Calcium: 9.1 mg/dL (ref 8.4–10.5)
PTH: 55 pg/mL (ref 14–64)

## 2014-05-10 NOTE — Telephone Encounter (Signed)
Please do prior authorization as she has are ready failed Amamitza, Linzess, Mira lax, and over-the-counter remedies.  Is there a another opioid-induced constipation medicine her insurance will cover?

## 2014-05-10 NOTE — Telephone Encounter (Signed)
Rcvd note from pharmacy stating that [place] 25 MG is not covered by pt's insurance plan and they want to know if there is anything else that can be used as an alternate

## 2014-05-10 NOTE — Telephone Encounter (Signed)
I fax over her referral to [name] to set up her Bone density scan and they will contact her for her appointment

## 2014-05-13 ENCOUNTER — Other Ambulatory Visit: Payer: Self-pay | Admitting: Medical

## 2014-05-13 MED ORDER — NALOXEGOL OXALATE 25 MG PO TABS: 1.0000 | ORAL_TABLET | Freq: Every day | ORAL | Status: DC

## 2014-05-14 ENCOUNTER — Other Ambulatory Visit: Payer: Self-pay | Admitting: Medical

## 2014-05-15 ENCOUNTER — Telehealth: Payer: Self-pay | Admitting: Medical

## 2014-05-15 ENCOUNTER — Other Ambulatory Visit: Payer: Self-pay | Admitting: Medical

## 2014-05-15 MED ORDER — LACTULOSE 10 GM/15ML PO SOLN
10.0000 g | Freq: Two times a day (BID) | ORAL | Status: DC | PRN
Start: 2014-05-15 — End: 2014-10-04

## 2014-05-15 NOTE — Telephone Encounter (Signed)
Her insurer is making her try Lactulose before they will cover Movantek.  Thus, have her try the lactulose BID I sent today for a week or 2, and if this doesn't work, we will try again to get [place] covered.

## 2014-05-16 ENCOUNTER — Telehealth: Payer: Self-pay | Admitting: Internal Medicine

## 2014-05-16 ENCOUNTER — Other Ambulatory Visit: Payer: Self-pay

## 2014-05-16 MED ORDER — LISINOPRIL 10 MG PO TABS: 10.0000 mg | ORAL_TABLET | Freq: Every day | ORAL | Status: DC

## 2014-05-16 MED ORDER — VERAPAMIL HCL ER 240 MG PO TBCR: 240.0000 mg | EXTENDED_RELEASE_TABLET | Freq: Every day | ORAL | Status: DC

## 2014-05-16 NOTE — Telephone Encounter (Signed)
Refill request for lisinopril 10mg  #30 to [facility]

## 2014-05-16 NOTE — Telephone Encounter (Signed)
LM to CB

## 2014-05-16 NOTE — Telephone Encounter (Signed)
done

## 2014-05-17 NOTE — Telephone Encounter (Signed)
LMOM TO CB. CLS 

## 2014-05-17 NOTE — Telephone Encounter (Signed)
Patient is aware of Audelia Acton tysinger PA message and recommendations

## 2014-05-20 LAB — HM DEXA SCAN

## 2014-05-23 ENCOUNTER — Encounter: Payer: Self-pay | Admitting: Internal Medicine

## 2014-06-03 ENCOUNTER — Ambulatory Visit: Payer: Medicare Other | Admitting: Neurology

## 2014-06-11 ENCOUNTER — Telehealth: Payer: Self-pay | Admitting: Family Medicine

## 2014-06-11 NOTE — Telephone Encounter (Signed)
PATIENT IS AWARE OF HER APPOINTMENT TO SEE DR. Lucia Gaskins ON 06/13/14 @ 115 PM [place], [place]

## 2014-06-14 ENCOUNTER — Other Ambulatory Visit: Payer: Self-pay | Admitting: Neurology

## 2014-06-28 ENCOUNTER — Other Ambulatory Visit: Payer: Self-pay | Admitting: Medical

## 2014-06-28 MED ORDER — RALOXIFENE HCL 60 MG PO TABS: 60.0000 mg | ORAL_TABLET | Freq: Every day | ORAL | Status: DC

## 2014-06-28 NOTE — Progress Notes (Signed)
After reviewing chart and the fact she hasn't tolerated bisphosphonate's such as fosamax, I recommend she begin once daily Evista for osteoporosis prevention.  Have her let me know in 3-4 weeks if having any problems with this.

## 2014-07-02 NOTE — Progress Notes (Signed)
Patient is aware of Dorothea Ogle PA and the medication that he sent to her pharmacy

## 2014-07-14 ENCOUNTER — Other Ambulatory Visit: Payer: Self-pay | Admitting: Neurology

## 2014-07-22 ENCOUNTER — Other Ambulatory Visit: Payer: Self-pay | Admitting: Medical

## 2014-07-23 ENCOUNTER — Other Ambulatory Visit: Payer: Self-pay | Admitting: Medical

## 2014-08-15 ENCOUNTER — Other Ambulatory Visit: Payer: Self-pay | Admitting: Medical

## 2014-08-15 ENCOUNTER — Other Ambulatory Visit: Payer: Self-pay | Admitting: Neurology

## 2014-08-21 ENCOUNTER — Encounter: Payer: Self-pay | Admitting: Medical

## 2014-08-21 ENCOUNTER — Ambulatory Visit (INDEPENDENT_AMBULATORY_CARE_PROVIDER_SITE_OTHER): Payer: Medicare Other | Admitting: Medical

## 2014-08-21 VITALS — BP 100/70 | HR 76 | Resp 15 | Wt 233.0 lb

## 2014-08-21 DIAGNOSIS — L259 Unspecified contact dermatitis, unspecified cause: Secondary | ICD-10-CM

## 2014-08-21 MED ORDER — METHYLPREDNISOLONE ACETATE 80 MG/ML IJ SUSP
80.0000 mg | Freq: Once | INTRAMUSCULAR | Status: AC
  Administered 2014-08-21: 80 mg via INTRAMUSCULAR

## 2014-08-21 NOTE — Progress Notes (Signed)
Subjective:   Emma [name] is a 59 y.o. female who presents for evaluation of a rash involving the chest, ear and arms and legs. Rash started 3 days ago, feels pink /red rash scattered under breast, on abdomen, on hands forearms and legs, upper chest, right ear.   Thinks she picked this up from dog running out in the woods.   She has been mowing outside.  No other new contacts.  No fever, NVD, no swelling, no other symptoms. No other aggravating or relieving factors. No other complaint.  The following portions of the patient's history were reviewed and updated as appropriate: allergies, current medications, past family history, past medical history, past social history and problem list.  Review of Systems As in subjective above   Objective:   Gen: wd, wn, nad Skin: pink puffy inflamed skin of bilat dorsal hands, scattered similar patches of forearms,chest, right face just inferior to ear, lower abdomen   Assessment:   Encounter Diagnosis  Name Primary?  . Contact dermatitis Yes      Plan:   Discussed symptoms and exam findings.  80mg  Depo Medrol IM given, c/t benadryl QHS the next few days and if not much improved in the next few days, call back.   Advised mask when mowing, wears long sleeves, long pants, gloves and boots when doing yard work, wash clothes and tools in hot soapy water to clean off any plant oil.  F/u prn.

## 2014-08-21 NOTE — Addendum Note (Signed)
Addended by: Armanda Magic on: 08/21/2014 03:21 PM   Modules accepted: Orders

## 2014-08-26 ENCOUNTER — Other Ambulatory Visit: Payer: Self-pay | Admitting: Medical

## 2014-08-26 ENCOUNTER — Telehealth: Payer: Self-pay | Admitting: Family Medicine

## 2014-08-26 MED ORDER — PREDNISONE 10 MG (21) PO TBPK: 10.0000 mg | ORAL_TABLET | Freq: Every day | ORAL | Status: DC

## 2014-08-26 NOTE — Telephone Encounter (Signed)
Patient called back and states that she is not any better. She said the rash is there and not any better. She said that you would call in a steroid dose pack for her if not any better. Please, advise

## 2014-08-26 NOTE — Telephone Encounter (Signed)
I left a message on the patient's voicemail about the Rx

## 2014-08-26 NOTE — Telephone Encounter (Signed)
Steroid sent

## 2014-09-05 ENCOUNTER — Telehealth: Payer: Self-pay | Admitting: Internal Medicine

## 2014-09-05 NOTE — Telephone Encounter (Signed)
If not clearing up, may need to see dermatology. I don't want to keep her on steroids.   Is she using benadryl or antihistamine?  When did she finish last steroid?

## 2014-09-05 NOTE — Telephone Encounter (Signed)
Pt called stating that she is out of her steroid and that it was not enough cause she is still covered in rashes over her body, itching and she is about to go crazy. Please send in another round of steriods to [facility]

## 2014-09-06 ENCOUNTER — Other Ambulatory Visit: Payer: Self-pay | Admitting: Medical

## 2014-09-06 MED ORDER — TRIAMCINOLONE ACETONIDE 0.1 % EX OINT: 1.0000 "application " | TOPICAL_OINTMENT | Freq: Two times a day (BID) | CUTANEOUS | Status: DC

## 2014-09-06 NOTE — Telephone Encounter (Signed)
i sent ointment to try the next 1-2 wk.

## 2014-09-06 NOTE — Telephone Encounter (Signed)
I left a message on the patients voicemail about Dorothea Ogle PA message

## 2014-09-06 NOTE — Telephone Encounter (Signed)
Patient states that yes she is taking Benadryl daily every 6 hours. She said that she finish the last steroid on June 8 th. Patient is also aware of the message from [facility] PA

## 2014-10-03 ENCOUNTER — Encounter: Payer: Self-pay | Admitting: Gastroenterology

## 2014-10-04 ENCOUNTER — Encounter: Payer: Self-pay | Admitting: Internal Medicine

## 2014-10-04 ENCOUNTER — Other Ambulatory Visit: Payer: Self-pay | Admitting: Medical

## 2014-10-14 LAB — HM MAMMOGRAPHY

## 2014-10-15 ENCOUNTER — Encounter: Payer: Self-pay | Admitting: Internal Medicine

## 2014-10-16 ENCOUNTER — Telehealth: Payer: Self-pay | Admitting: Medical

## 2014-10-16 NOTE — Telephone Encounter (Signed)
Patient is aware of the mammogram results

## 2014-10-16 NOTE — Telephone Encounter (Signed)
Mammogram normal/no worrisome findings. 

## 2014-11-15 ENCOUNTER — Other Ambulatory Visit: Payer: Self-pay | Admitting: Medical

## 2014-11-19 ENCOUNTER — Encounter: Payer: Self-pay | Admitting: Neurology

## 2014-11-19 ENCOUNTER — Ambulatory Visit (INDEPENDENT_AMBULATORY_CARE_PROVIDER_SITE_OTHER): Payer: Medicare Other | Admitting: Neurology

## 2014-11-19 VITALS — BP 113/70 | HR 72 | Ht 69.0 in | Wt 234.6 lb

## 2014-11-19 DIAGNOSIS — G44029 Chronic cluster headache, not intractable: Secondary | ICD-10-CM | POA: Diagnosis not present

## 2014-11-19 MED ORDER — RIZATRIPTAN BENZOATE 10 MG PO TBDP: 10.0000 mg | ORAL_TABLET | ORAL | Status: DC | PRN

## 2014-11-19 NOTE — Progress Notes (Signed)
PATIENT: Emma [name] DOB: 12-05-1955  REASON FOR VISIT: routine follow up for cluster headache HISTORY FROM: patient  HISTORY OF PRESENT ILLNESS: 59 year old Caucasian female who previously seen Dr. Leonie Man approximately 3 years ago for left-sided head pain who was lost to followup at that time with persistent left-sided head pain which has worsened in the last year. She taking Topamax 50 mg twice a day without relief. She reports these headaches are sharp , shooting and start behind left eye and shoot to left sideof face and neck may last up to 2 days where she remains in the bed. She has to be in the dark for relief. During that time her mother died.  11-12-2011 (JM): Returns for follow up visit since 07/27/11. Reports she is having headaches 3 days per week, was 2 days per week. She has been taking Topamax 100mg  BID, tolerating well but no relief. She has also been taking gabapentin 100mg  daily, denies any side effects she was concerned about having seizures and not being able to stop the medication. She can not relate any triggers to her headaches. Today she has a severe headache on the left side that started on Sunday and has not had any relief. Reports photophobia and intermittent nausea. She has tried Maxalt in the past but stopped secondary to cost, this was prior to going generic.  She also has history of degenerative disc disease.  04/27/13 (LL): She comes in for worsening headaches. Last here in mid 2013. She has continued to take Gabapentin since that time. But sees no benefit. Headaches are always left sided, temporal and parietal, vascular type which regularly wake her up at 4 am. They are accompanied with left eye injection, without tearing. She often has to stay in dark room most of the day with headache, and nausea often is present. She endorses poor sleep, even with alprazolam, soma, mirapex and lunesta nightly.  08/31/13 (LL): Emma [name] comes in for follow up for cluster headaches.  She  has been doing much better on Verapamil as preventative.  She is sleeping better as well since starting this which is helping as well.  She has not had any severe headaches in 3 months.  She has not had to take Rizatriptan in over a month.  Since last here her sister died suddenly from a PE. Her husband is also seen in this practice for mild cognitive impairment. Update 11/19/2014 : She returns for follow-up after last visit more than a year ago. She continues to do well and has only occasional cluster headaches mainly involving the left eye and upon awakening from sleep. She takes Maxalt which works quite well. She remains on Calan SR 240 mg at night is tolerating it well without side effects. The headache is described as constant and severe headache 10/10 in severity but not a complaint of nausea vomiting redness or tearing. She does have sensitivity to light and sound and needs to lie down with the headaches. She has no other complaints. Review of Systems  Out of a complete 14 system review, the patient complains of only the following symptoms, and all other reviewed systems are negative.   Eye redness, leg swelling, murmur, restless leg, allergies, headache, joint pain and swelling, back pain, aching muscles, muscle cramps and all other systems negative  ALLERGIES: Allergies  Allergen Reactions  . Bee Venom Anaphylaxis    HOME MEDICATIONS: Outpatient Prescriptions Prior to Visit  Medication Sig Dispense Refill  . ALPRAZolam (XANAX) 0.5 MG tablet  Take 0.5 mg by mouth at bedtime as needed. 2 to 3 tablets per night    . carisoprodol (SOMA) 350 MG tablet Take 350 mg by mouth at bedtime.     . CONSTULOSE 10 GM/15ML solution TAKE 15ML BY MOUTH 2 TIMES DAILY AS NEEDED FOR MILD CONSTIPATION. 240 mL 0  . eszopiclone (LUNESTA) 2 MG TABS tablet Take 3 mg by mouth at bedtime as needed for sleep. Take immediately before bedtime    . etodolac (LODINE) 400 MG tablet Take 400 mg by mouth as needed.     .  furosemide (LASIX) 20 MG tablet TAKE 1 TABLET ONCE DAILY. 30 tablet 0  . gabapentin (NEURONTIN) 300 MG capsule TAKE (1) CAPSULE THREE TIMES DAILY. 90 capsule 1  . HYDROcodone-acetaminophen (NORCO) 7.5-325 MG per tablet Take 1 tablet by mouth as needed.     Marland Kitchen LINZESS 290 MCG CAPS capsule TAKE (1) CAPSULE DAILY. 30 capsule 0  . lisinopril (PRINIVIL,ZESTRIL) 10 MG tablet TAKE 1 TABLET ONCE DAILY. 30 tablet 5  . metFORMIN (GLUCOPHAGE) 500 MG tablet TAKE 1 TABLET EVERY DAY WITH BREAKFAST. 30 tablet 6  . raloxifene (EVISTA) 60 MG tablet Take 1 tablet (60 mg total) by mouth daily. 30 tablet 11  . verapamil (CALAN-SR) 240 MG CR tablet TAKE ONE TABLET AT BEDTIME. 30 tablet 2  . rizatriptan (MAXALT-MLT) 10 MG disintegrating tablet Take 1 tablet (10 mg total) by mouth as needed for migraine. May repeat in 2 hours if needed 20 tablet 11  . EPINEPHrine (EPIPEN) 0.3 mg/0.3 mL IJ SOAJ injection Inject 0.3 mLs (0.3 mg total) into the muscle once. (Patient not taking: Reported on 11/19/2014) 1 Device 1  . fluticasone (FLONASE) 50 MCG/ACT nasal spray Place 2 sprays into both nostrils daily. (Patient not taking: Reported on 11/19/2014) 16 g 2  . predniSONE (STERAPRED UNI-PAK 21 TAB) 10 MG (21) TBPK tablet Take 1 tablet (10 mg total) by mouth daily. 6/5/4/3/2/1 taper (Patient not taking: Reported on 11/19/2014) 21 tablet 0  . triamcinolone ointment (KENALOG) 0.1 % Apply 1 application topically 2 (two) times daily. (Patient not taking: Reported on 11/19/2014) 80 g 0   No facility-administered medications prior to visit.   PHYSICAL EXAM  Filed Vitals:   11/19/14 1439  BP: 113/70  Pulse: 72  Height: 5\' 9"  (1.753 m)  Weight: 234 lb 9.6 oz (106.414 kg)   Body mass index is 34.63 kg/(m^2). No exam data present  Physical Exam  General: Pleasant, in no distress.  Head: nontraumatic, nontender  Ears, Nose and Throat: Hearing is normal.  Neck: supple without bruit  Respiratory: clear to auscultation  Cardiovascular:  no murmur or gallop   Neurologic Exam  Mental Status: Awake, alert and oriented to time, place and person. Speech and language appear normal.  Cranial Nerves: Eye movements are full range without nystagmus. Fundi revealed sharp disc margins without papilledema. Visual fields are full to confrontational testing. Face is symmetric without weakness. Tongue is midline. Hearing is normal. no sensory loss on face.  Motor: reveals no upper or lower extremity drift. Symmetric and equal strength in all four extremities. No focal weakness.  Sensory: Touch and pinprick sensations are normal.  Coordination: normal  Gait and Station: steady gait including tandem walking, able to heel and toe walk without difficulty  Reflexes: Deep tendon reflexes are 2+ symmetric.   ASSESSMENT AND PLAN  59 year old Caucasian female with 3 years history of left-sided headache likely cluster headache syndrome with neuroulogic exam normal. She has done  well since starting Verapamil, now down to 1-2 non-severe headaches per month.  I had a long discussion with the patient and her husband regarding her cluster-like headaches which continue to respond very well to Maxalt. I gave her a refill of Maxalt and advised to continue taking Calan SR 24. Greater than 50% of time during this 20 family to visit was spent on counseling and coordination of care.   Meds ordered this encounter  Medications  . rizatriptan (MAXALT-MLT) 10 MG disintegrating tablet    Sig: Take 1 tablet (10 mg total) by mouth as needed for migraine. May repeat in 2 hours if needed    Dispense:  20 tablet    Refill:  11   Return in about 1 year (around 11/19/2015).  Antony Contras, MD  11/19/2014, 4:02 PM [facility] [name] [place], [place] [address] [phone]  Note: This document was prepared with digital dictation and possible smart phrase technology. Any transcriptional errors that result from this process are  unintentional.

## 2014-11-19 NOTE — Patient Instructions (Signed)
I had a long discussion with the patient and her husband regarding her cluster-like headaches which continue to respond very well to Maxalt. I gave her a refill of Maxalt and advised to continue taking Calan SR 24 Cluster Headache Cluster headaches are recognized by their pattern of deep, intense head pain. They normally occur on one side of your head, but they may "switch sides" in subsequent episodes. Typically, cluster headaches:   Are severe in nature.   Occur repeatedly over weeks to months and are followed by periods of no headaches.   Can last from 15 minutes to 3 hours.   Occur at the same time each day, often at night.   Occur several times a day. CAUSES The exact cause of cluster headaches is not known. Alcohol use may be associated with cluster headaches. SIGNS AND SYMPTOMS   Severe pain that begins in or around your eye or temple.   One-sided head pain.   Feeling sick to your stomach (nauseous).   Sensitivity to light.   Runny nose.   Eye redness, tearing, and nasal stuffiness on the side of your head where you are experiencing pain.   Sweaty, pale skin of the face.   Droopy or swollen eyelid.   Restlessness. DIAGNOSIS  Cluster headaches are diagnosed based on symptoms and a physical exam. Your health care provider may order a CT scan or an MRI of your head or lab tests to see if your headaches are caused by other medical conditions.  TREATMENT   Medicines for pain relief and to prevent recurrent attacks. Some people may need a combination of medicines.  Oxygen for pain relief.   Biofeedback programs to help reduce headache pain.  It may be helpful to keep a headache diary. This may help you find a trend for what is triggering your headaches. Your health care provider can develop a treatment plan.  HOME CARE INSTRUCTIONS  During cluster periods:   Follow a regular sleep schedule. Do not vary the amount and time that you sleep from day to day.  It is important to stay on the same schedule during a cluster period to help prevent headaches.   Avoid alcohol.   Stop smoking if you smoke.  SEEK MEDICAL CARE IF:  You have any changes from your previous cluster headaches either in intensity or frequency.   You are not getting relief from medicines you are taking.  SEEK IMMEDIATE MEDICAL CARE IF:   You faint.   You have weakness or numbness, especially on one side of your body or face.   You have double vision.   You have nausea or vomiting that is not relieved within several hours.   You cannot keep your balance or have difficulty talking or walking.   You have neck pain or stiffness.   You have a fever. MAKE SURE YOU:  Understand these instructions.   Will watch your condition.   Will get help right away if you are not doing well or get worse. Document Released: 03/08/2005 Document Revised: 12/27/2012 Document Reviewed: 09/28/2012 [facility] Patient Information 2015 [place], [place]. This information is not intended to replace advice given to you by your health care provider. Make sure you discuss any questions you have with your health care provider. 0 mg daily for prophylaxis. She was advised to return for follow-up in a year or call earlier if necessary.

## 2014-11-21 ENCOUNTER — Other Ambulatory Visit: Payer: Self-pay | Admitting: Neurology

## 2014-12-16 ENCOUNTER — Other Ambulatory Visit: Payer: Self-pay | Admitting: Medical

## 2015-01-02 ENCOUNTER — Other Ambulatory Visit: Payer: Self-pay | Admitting: Medical

## 2015-01-24 ENCOUNTER — Other Ambulatory Visit: Payer: Self-pay | Admitting: Medical

## 2015-01-24 NOTE — Telephone Encounter (Signed)
Is this okay?

## 2015-02-03 ENCOUNTER — Other Ambulatory Visit: Payer: Self-pay | Admitting: Medical

## 2015-02-21 ENCOUNTER — Other Ambulatory Visit: Payer: Self-pay | Admitting: Medical

## 2015-02-24 NOTE — Telephone Encounter (Signed)
Is this ok to refill?  

## 2015-02-25 NOTE — Telephone Encounter (Signed)
Is this ok to refill?  

## 2015-03-25 ENCOUNTER — Telehealth: Payer: Self-pay | Admitting: Medical

## 2015-03-25 ENCOUNTER — Other Ambulatory Visit: Payer: Self-pay | Admitting: Medical

## 2015-03-25 NOTE — Telephone Encounter (Signed)
Referral sent to dr truslows office. Pt is aware.

## 2015-03-25 NOTE — Telephone Encounter (Signed)
Generally we have rheumatologist manage those types of medications.   Is she in the process of finding a different rheumatologist or would she like to be referred to Dr. Charlestine Night or [place] here in town?  I would rather defer that type of medication to rheumatology.

## 2015-03-25 NOTE — Telephone Encounter (Signed)
Pt called and stated that her new Rheumatologist is no longer in network. She states that the last time she saw her she was told that she needed to consider going on [place]. She is requesting a rx for that, she has never been on it before. She is requesting that you prescribe it as you fill all the rest of her meds. Pt has a NEW PHARMACY [address].She can be reached at [phone]. Please let pt know if you can fill or not.

## 2015-03-27 ENCOUNTER — Ambulatory Visit (INDEPENDENT_AMBULATORY_CARE_PROVIDER_SITE_OTHER): Payer: Medicare HMO | Admitting: Medical

## 2015-03-27 ENCOUNTER — Encounter: Payer: Self-pay | Admitting: Family Medicine

## 2015-03-27 ENCOUNTER — Encounter: Payer: Self-pay | Admitting: Medical

## 2015-03-27 VITALS — BP 140/80 | HR 65 | Wt 235.0 lb

## 2015-03-27 DIAGNOSIS — F32A Depression, unspecified: Secondary | ICD-10-CM | POA: Insufficient documentation

## 2015-03-27 DIAGNOSIS — F418 Other specified anxiety disorders: Secondary | ICD-10-CM

## 2015-03-27 DIAGNOSIS — Z79899 Other long term (current) drug therapy: Secondary | ICD-10-CM | POA: Diagnosis not present

## 2015-03-27 DIAGNOSIS — F419 Anxiety disorder, unspecified: Secondary | ICD-10-CM | POA: Insufficient documentation

## 2015-03-27 DIAGNOSIS — R609 Edema, unspecified: Secondary | ICD-10-CM | POA: Insufficient documentation

## 2015-03-27 DIAGNOSIS — K219 Gastro-esophageal reflux disease without esophagitis: Secondary | ICD-10-CM | POA: Insufficient documentation

## 2015-03-27 DIAGNOSIS — M8580 Other specified disorders of bone density and structure, unspecified site: Secondary | ICD-10-CM

## 2015-03-27 DIAGNOSIS — G4700 Insomnia, unspecified: Secondary | ICD-10-CM | POA: Insufficient documentation

## 2015-03-27 DIAGNOSIS — K589 Irritable bowel syndrome without diarrhea: Secondary | ICD-10-CM | POA: Diagnosis not present

## 2015-03-27 DIAGNOSIS — M069 Rheumatoid arthritis, unspecified: Secondary | ICD-10-CM | POA: Diagnosis not present

## 2015-03-27 DIAGNOSIS — G44029 Chronic cluster headache, not intractable: Secondary | ICD-10-CM | POA: Diagnosis not present

## 2015-03-27 DIAGNOSIS — E669 Obesity, unspecified: Secondary | ICD-10-CM | POA: Diagnosis not present

## 2015-03-27 DIAGNOSIS — I872 Venous insufficiency (chronic) (peripheral): Secondary | ICD-10-CM | POA: Insufficient documentation

## 2015-03-27 DIAGNOSIS — I10 Essential (primary) hypertension: Secondary | ICD-10-CM | POA: Diagnosis not present

## 2015-03-27 DIAGNOSIS — E118 Type 2 diabetes mellitus with unspecified complications: Secondary | ICD-10-CM | POA: Insufficient documentation

## 2015-03-27 DIAGNOSIS — F411 Generalized anxiety disorder: Secondary | ICD-10-CM | POA: Insufficient documentation

## 2015-03-27 DIAGNOSIS — K5909 Other constipation: Secondary | ICD-10-CM | POA: Insufficient documentation

## 2015-03-27 DIAGNOSIS — K5900 Constipation, unspecified: Secondary | ICD-10-CM

## 2015-03-27 DIAGNOSIS — F329 Major depressive disorder, single episode, unspecified: Secondary | ICD-10-CM

## 2015-03-27 DIAGNOSIS — Z8669 Personal history of other diseases of the nervous system and sense organs: Secondary | ICD-10-CM | POA: Insufficient documentation

## 2015-03-27 LAB — CBC WITH DIFFERENTIAL/PLATELET
Basophils Absolute: 0.1 10*3/uL (ref 0.0–0.1)
Basophils Relative: 1 % (ref 0–1)
Eosinophils Absolute: 0.8 10*3/uL — ABNORMAL HIGH (ref 0.0–0.7)
Eosinophils Relative: 10 % — ABNORMAL HIGH (ref 0–5)
HCT: 37.8 % (ref 36.0–46.0)
Hemoglobin: 12.5 g/dL (ref 12.0–15.0)
Lymphocytes Relative: 40 % (ref 12–46)
Lymphs Abs: 3.1 10*3/uL (ref 0.7–4.0)
MCH: 30.4 pg (ref 26.0–34.0)
MCHC: 33.1 g/dL (ref 30.0–36.0)
MCV: 92 fL (ref 78.0–100.0)
MPV: 11 fL (ref 8.6–12.4)
Monocytes Absolute: 0.5 10*3/uL (ref 0.1–1.0)
Monocytes Relative: 7 % (ref 3–12)
Neutro Abs: 3.2 10*3/uL (ref 1.7–7.7)
Neutrophils Relative %: 42 % — ABNORMAL LOW (ref 43–77)
Platelets: 314 10*3/uL (ref 150–400)
RBC: 4.11 MIL/uL (ref 3.87–5.11)
RDW: 13.3 % (ref 11.5–15.5)
WBC: 7.7 10*3/uL (ref 4.0–10.5)

## 2015-03-27 LAB — TSH: TSH: 2.51 u[IU]/mL (ref 0.350–4.500)

## 2015-03-27 MED ORDER — LISINOPRIL 20 MG PO TABS: 20.0000 mg | ORAL_TABLET | Freq: Every day | ORAL | Status: DC

## 2015-03-27 MED ORDER — OMEPRAZOLE 40 MG PO CPDR: 40.0000 mg | DELAYED_RELEASE_CAPSULE | Freq: Every day | ORAL | Status: DC

## 2015-03-27 MED ORDER — LACTULOSE 10 GM/15ML PO SOLN: ORAL | Status: DC

## 2015-03-27 NOTE — Progress Notes (Signed)
Subjective: Chief Complaint  Patient presents with  . med check    pt is stressed. says everything wiht medication is just now working. wants acid reflux medication   Medical team: Crisoforo Oxford, PA-C here for primary care Dr. Lolita Rieger rheum, but getting ready to transfer to Dr. Sharyn Creamer dermatology  Dr. Maryjean Ka and neurosurgereon at [facility]  Stressed out.  Husbands health is declining, she is going crazy dealing with his issues.  Its like he is giving up, he isn't doing anything but sitting in the chair all day, has no energy, is ddrinkinga lot of wine.  Having to baby sit him now.    Diabetes - compliant with metformin 500mg  once daily, not checking glucose currently  HTN - taking Lisinopril 10mg  daily, verapamil 240 CR daily, checks BP at home.  Seeing 150s SBP, 90s DBP  RA - her rheumatologist she had been seeing is now not on preferred list on her insurance.   She has appt with Dr. Charlestine Night coming up.   Her prior rheumatologist wanted to have her begin [place], but found out that its way too expensive.   muscle spasms and pain, chronic - takes Soma QHS.  Takes Lodine daily, Gabapentin on average once daily.  Uses this instead of Hydrocodone.  Gets Orencia infusions monthly.    Hx/o low bone mass, RA, has been doing yearly Reclast, wants to go ahead and get this done.   constipation - taking Lactulose and this is working better.   Not taking Linzess or Amitiza currently  insomnia - takes Lunata daily  Edema - takes Lasix very seldom  Past Medical History  Diagnosis Date  . Rheumatoid arthritis(714.0)   . Overweight(278.02) 06/11/2011  . Depression with anxiety 02/22/2008    Qualifier: Diagnosis of  By: Nolon Rod CMA (AAMA), Robin    . GERD (gastroesophageal reflux disease) 06/11/2011  . Colon polyp 06/11/2011    Most recent 04/23/08, no polyp--mild diverticulosis.  Repeat 10 yrs.  . Hypertension     Recently diagnosed  . Chronic constipation   .  PUD (peptic ulcer disease) 04/23/08    Multiple antrum ulcers; h pylori neg  . Migraine syndrome   . Cluster headache syndrome 04/27/13   Past Surgical History  Procedure Laterality Date  . Appendectomy    . Abdominal hysterectomy    . Cholecystectomy    . Back surgery      3 times  . Cardiovascular stress test  06/22/11    Normal; EF 65%  . Liver biopsy  03/2007    Chronic active hepatitis w/fibrosis--related to hx of methotrexate treatment.  . Colonoscopy  04/23/2008    mild diverticulosis, internal hemorrhoids; Dr. Erskine Emery     ROS as in subjective  Objective: BP 140/80 mmHg  Pulse 65  Wt 235 lb (106.595 kg)  General appearance: alert, no distress, WD/WN, white female HEENT: normocephalic, sclerae anicteric, TMs pearly, nares patent, no discharge or erythema, pharynx normal Oral cavity: MMM, no lesions Neck: supple, no lymphadenopathy, no thyromegaly, no masses, no bruits Heart: RRR, normal S1, S2, no murmurs Lungs: CTA bilaterally, no wheezes, rhonchi, or rales Abdomen: +bs, soft, right surgical scars, s/p appy and choly, non tender, non distended, no masses, no hepatomegaly, no splenomegaly Ext: no edema Pulses: 1+ symmetric, upper and lower extremities, normal cap refill  Diabetic Foot Exam - Simple   Simple Foot Form  Diabetic Foot exam was performed with the following findings:  Yes 03/27/2015  3:41 PM  Visual Inspection  See comments:  Yes  Sensation Testing  See comments:  Yes  Pulse Check  See comments:  Yes  Comments  Spider veins present, 1+ pedal pulses, slight decreased monofilament sensation on right 5th toe and lateral foot, but mostly normal otherwise bilat.         Assessment: Encounter Diagnoses  Name Primary?  . Essential hypertension Yes  . Rheumatoid arthritis involving multiple joints ([place])   . High risk medication use   . Insomnia   . Chronic constipation   . Edema, unspecified type   . Obesity   . Chronic venous insufficiency   .  Chronic cluster headache, not intractable   . Diabetes mellitus with complication ([place])   . Gastroesophageal reflux disease without esophagitis   . IBS (irritable bowel syndrome)   . Anxiety and depression   . Low bone mass     Plan: reviewed chart and updated chart.   Advised yearly mammogram, and return soon for pap/breast exam (past due).  reviewed last colonoscopy from 04/23/2008.  HTN - increase to Lisinopril 20mg  daily, c/t Verapamil 240mg  CR daily (mainly for migraines), check BPs ongoing, discussed goal BPs  GERD - begin prescription omeprazole, avoid triggers  Diabetes - labs today, c/t daily foot checks, yearly eye doctor visit, c/t Metformin 500mg  once daily  Anxiety - counseled on coping skills, can use Xanax prn, consider counseling  RA - f/u with new establish appt with Dr. Charlestine Night as planned, c/t Soma QHS, c/t Lodine daily, Gabapentin daily, Orencia infusions monthly.    Hx/o low bone mass, RA, has been doing yearly Reclast, wants to go ahead and get this done.  Will work on referral  constipation -c/t taking Lactulose and this is working better.     insomnia - c/t Lunesta daily  Edema - takes Lasix very seldom  Emma [name] was seen today for med check.  Diagnoses and all orders for this visit:  Essential hypertension -     Comprehensive metabolic panel -     CBC with Differential/Platelet -     Lipid panel -     TSH -     Cancel: Microalbumin / creatinine urine ratio  Rheumatoid arthritis involving multiple joints (HCC) -     Sedimentation rate  High risk medication use -     Comprehensive metabolic panel -     CBC with Differential/Platelet -     Lipid panel -     TSH  Insomnia  Chronic constipation  Edema, unspecified type  Obesity -     TSH  Chronic venous insufficiency  Chronic cluster headache, not intractable  Diabetes mellitus with complication (HCC) -     Lipid panel -     Hemoglobin A1c -     HM DIABETES EYE EXAM -     HM DIABETES  FOOT EXAM -     Cancel: Microalbumin / creatinine urine ratio  Gastroesophageal reflux disease without esophagitis  IBS (irritable bowel syndrome)  Anxiety and depression  Low bone mass  Other orders -     omeprazole (PRILOSEC) 40 MG capsule; Take 1 capsule (40 mg total) by mouth daily. -     lactulose (CHRONULAC) 10 GM/15ML solution; TAKE 15 MLS BY MOUTH TWICE DAILY AS NEEDED FOR CONSTIPATION -     lisinopril (PRINIVIL,ZESTRIL) 20 MG tablet; Take 1 tablet (20 mg total) by mouth daily.

## 2015-03-28 ENCOUNTER — Encounter: Payer: Self-pay | Admitting: Medical

## 2015-03-28 LAB — COMPREHENSIVE METABOLIC PANEL
ALT: 20 U/L (ref 6–29)
AST: 15 U/L (ref 10–35)
Albumin: 3.8 g/dL (ref 3.6–5.1)
Alkaline Phosphatase: 72 U/L (ref 33–130)
BUN: 21 mg/dL (ref 7–25)
CO2: 21 mmol/L (ref 20–31)
Calcium: 9.1 mg/dL (ref 8.6–10.4)
Chloride: 105 mmol/L (ref 98–110)
Creat: 1.2 mg/dL — ABNORMAL HIGH (ref 0.50–0.99)
Glucose, Bld: 168 mg/dL — ABNORMAL HIGH (ref 65–99)
Potassium: 4.6 mmol/L (ref 3.5–5.3)
Sodium: 140 mmol/L (ref 135–146)
Total Bilirubin: 0.3 mg/dL (ref 0.2–1.2)
Total Protein: 6.5 g/dL (ref 6.1–8.1)

## 2015-03-28 LAB — SEDIMENTATION RATE: Sed Rate: 17 mm/hr (ref 0–30)

## 2015-03-28 LAB — HEMOGLOBIN A1C
Hgb A1c MFr Bld: 6.4 % — ABNORMAL HIGH (ref <5.7)
Mean Plasma Glucose: 137 mg/dL — ABNORMAL HIGH (ref <117)

## 2015-03-28 LAB — LIPID PANEL
Cholesterol: 219 mg/dL — ABNORMAL HIGH (ref 125–200)
HDL: 46 mg/dL (ref >=46)
LDL Cholesterol: 135 mg/dL — ABNORMAL HIGH (ref <130)
Total CHOL/HDL Ratio: 4.8 Ratio (ref <=5.0)
Triglycerides: 191 mg/dL — ABNORMAL HIGH (ref <150)
VLDL: 38 mg/dL — ABNORMAL HIGH (ref <30)

## 2015-03-30 ENCOUNTER — Encounter: Payer: Self-pay | Admitting: Medical

## 2015-03-30 ENCOUNTER — Other Ambulatory Visit: Payer: Self-pay | Admitting: Medical

## 2015-03-30 DIAGNOSIS — M8580 Other specified disorders of bone density and structure, unspecified site: Secondary | ICD-10-CM | POA: Insufficient documentation

## 2015-03-30 MED ORDER — PRAVASTATIN SODIUM 20 MG PO TABS: 20.0000 mg | ORAL_TABLET | Freq: Every day | ORAL | Status: DC

## 2015-03-30 MED ORDER — METFORMIN HCL 500 MG PO TABS
500.0000 mg | ORAL_TABLET | Freq: Every day | ORAL | Status: DC
Start: 2015-03-30 — End: 2015-07-18

## 2015-03-30 MED ORDER — GABAPENTIN 300 MG PO CAPS
ORAL_CAPSULE | ORAL | Status: DC
Start: 2015-03-30 — End: 2015-07-18

## 2015-04-03 DIAGNOSIS — M79671 Pain in right foot: Secondary | ICD-10-CM | POA: Diagnosis not present

## 2015-04-03 DIAGNOSIS — M79672 Pain in left foot: Secondary | ICD-10-CM | POA: Diagnosis not present

## 2015-04-03 DIAGNOSIS — Z79899 Other long term (current) drug therapy: Secondary | ICD-10-CM | POA: Diagnosis not present

## 2015-04-03 DIAGNOSIS — M545 Low back pain: Secondary | ICD-10-CM | POA: Diagnosis not present

## 2015-04-03 DIAGNOSIS — R69 Illness, unspecified: Secondary | ICD-10-CM | POA: Diagnosis not present

## 2015-04-03 DIAGNOSIS — M19041 Primary osteoarthritis, right hand: Secondary | ICD-10-CM | POA: Diagnosis not present

## 2015-04-03 DIAGNOSIS — M19042 Primary osteoarthritis, left hand: Secondary | ICD-10-CM | POA: Diagnosis not present

## 2015-04-04 ENCOUNTER — Ambulatory Visit (INDEPENDENT_AMBULATORY_CARE_PROVIDER_SITE_OTHER): Payer: Medicare Other | Admitting: Medical

## 2015-04-04 ENCOUNTER — Encounter: Payer: Self-pay | Admitting: Medical

## 2015-04-04 VITALS — BP 104/78 | HR 67 | Wt 235.0 lb

## 2015-04-04 DIAGNOSIS — M545 Low back pain: Secondary | ICD-10-CM

## 2015-04-04 DIAGNOSIS — M2560 Stiffness of unspecified joint, not elsewhere classified: Secondary | ICD-10-CM

## 2015-04-04 DIAGNOSIS — M2550 Pain in unspecified joint: Secondary | ICD-10-CM

## 2015-04-04 DIAGNOSIS — M791 Myalgia, unspecified site: Secondary | ICD-10-CM

## 2015-04-04 LAB — RHEUMATOID FACTOR: Rhuematoid fact SerPl-aCnc: <10 IU/mL (ref <=14)

## 2015-04-04 NOTE — Progress Notes (Signed)
Subjective: Chief Complaint  Patient presents with  . Advice Only    diagnosed at 1 with rhumatoid arthritis. referred to dr truslow. was told by dr truslow she didnt have ra. wants to  talk to you becuase she is mad and upset by this new knowledge.    Here to discuss recent consult with Dr. Charlestine Night.  She reports initial diagnosis of rheumatoid arthritis 20+ years ago with Dr. Retta Diones in [place], and subsequent f/u has been with Dr. Luiz Blare at the same office for years.   recently she had insurance change where she could no longer see her old rheumatologist so we referred her to Dr. Charlestine Night.  She had consult with him yesterday, and after 1.5 hour visit, xrays of her joints he felt that she actually didn't have RA.  Felt like she may have fibromaylgia but felt 80% sure she didn't have RA.  This threw her for a loop considering she has been on medications including high risk medication for 20+ years for a condition she now is being told she doesn't have.   She does endorse multiple joint pains, bilat hand and finger pains, joint swelling in the hands, morning stiffness.  She wants to discuss this and where we go from here.  She says its like finding out your father isn't your father, somewhat of a kick in the gut. She doesn't know whether to be excited or frustrated.  No other aggravating or relieving factors. No other complaint.    Past Medical History  Diagnosis Date  . Rheumatoid arthritis(714.0)   . Overweight(278.02) 06/11/2011  . Depression with anxiety 02/22/2008    Qualifier: Diagnosis of  By: Nolon Rod CMA (AAMA), Robin    . GERD (gastroesophageal reflux disease) 06/11/2011  . Colon polyp 06/11/2011    Most recent 04/23/08, no polyp--mild diverticulosis.  Repeat 10 yrs.  . Hypertension     Recently diagnosed  . Chronic constipation   . PUD (peptic ulcer disease) 04/23/08    Multiple antrum ulcers; h pylori neg  . Migraine syndrome   . Cluster headache syndrome 04/27/13    ROS as in subjective   Objective: BP 104/78 mmHg  Pulse 67  Wt 235 lb (106.595 kg)  Gen: wd, wn, nad    Assessment: Encounter Diagnoses  Name Primary?  . Polyarthralgia Yes  . Myalgia   . Joint stiffness   . Low back pain without sciatica, unspecified back pain laterality      Plan: I understand her frustration.   Tried to remained unbiased.   We will request prior [place], [place] rheumatoid records, will await Dr. Elmon Else notes, and then help try to make sense of it all.  Reviewed extensive records in the chart we have..  For now, advised she be thankful she has not had joint erosions, she still has good function of her joints and body, and overall this could be a blessing if she actually doesn't have RA and was misdiagnosed.   Will get labs today and await records.

## 2015-04-07 LAB — CYCLIC CITRUL PEPTIDE ANTIBODY, IGG: Cyclic Citrullin Peptide Ab: <16 Units

## 2015-04-08 DIAGNOSIS — M5137 Other intervertebral disc degeneration, lumbosacral region: Secondary | ICD-10-CM | POA: Diagnosis not present

## 2015-04-08 DIAGNOSIS — M545 Low back pain: Secondary | ICD-10-CM | POA: Diagnosis not present

## 2015-04-08 DIAGNOSIS — M5416 Radiculopathy, lumbar region: Secondary | ICD-10-CM | POA: Diagnosis not present

## 2015-04-09 ENCOUNTER — Telehealth: Payer: Self-pay

## 2015-04-09 NOTE — Telephone Encounter (Signed)
Emma [name] will you do this?

## 2015-04-09 NOTE — Telephone Encounter (Signed)
Pt stated that she called her Rheumatologists office after I checked yesterday and her records have not been received. They stated that they sent a year worth. Pt said she hasn't been much over the past year. Wants [place] to get the records for the last 4 years.

## 2015-04-09 NOTE — Telephone Encounter (Signed)
[facility] Rheumatology but didn't get an answer, they are closed for lunch from 12-1:30. When they open I will be gone, I asked Tammy if she would call them saying we haven't received records and to refax them. I also informed the pt that they are closed at the moment but we will try them again when they open.

## 2015-04-09 NOTE — Telephone Encounter (Signed)
Pt is calling Rheumatologist to see about reclast. She states that she will let us know, since dr truslow diagnosed her with osteopenia shes not sure why he cant do the reclast.

## 2015-04-10 ENCOUNTER — Telehealth: Payer: Self-pay

## 2015-04-10 NOTE — Telephone Encounter (Signed)
I called pt's rheumatologist office yesterday to get medical record again but medical record dept never answered the phone so I refaxed the request yesterday. I have called the medical records department again this morning and continue to get the voicemail. Left a message.

## 2015-04-10 NOTE — Telephone Encounter (Signed)
Labs sent to Dr. Charlestine Night on 04/10/15  [phone]

## 2015-04-14 ENCOUNTER — Telehealth: Payer: Self-pay | Admitting: Medical

## 2015-04-14 NOTE — Telephone Encounter (Signed)
pls send copy of last labs, and 05/25/14 (both) scanned documents for bone density scan to Dr. Charlestine Night

## 2015-04-14 NOTE — Telephone Encounter (Signed)
done

## 2015-04-16 ENCOUNTER — Telehealth: Payer: Self-pay

## 2015-04-16 NOTE — Telephone Encounter (Signed)
Have we received her record yet?

## 2015-04-21 ENCOUNTER — Other Ambulatory Visit: Payer: Self-pay | Admitting: Medical

## 2015-05-05 ENCOUNTER — Telehealth: Payer: Self-pay | Admitting: Medical

## 2015-05-05 NOTE — Telephone Encounter (Signed)
Pt called and wanted to know if shane was finished reviewing rheumatologist notes and does she need an appt. Please advise pt at  [phone].

## 2015-05-07 NOTE — Telephone Encounter (Signed)
Pt called again to ask about her records and what she should do

## 2015-05-15 ENCOUNTER — Telehealth: Payer: Self-pay | Admitting: Medical

## 2015-05-15 DIAGNOSIS — M19042 Primary osteoarthritis, left hand: Secondary | ICD-10-CM | POA: Diagnosis not present

## 2015-05-15 DIAGNOSIS — M19041 Primary osteoarthritis, right hand: Secondary | ICD-10-CM | POA: Diagnosis not present

## 2015-05-15 DIAGNOSIS — M545 Low back pain: Secondary | ICD-10-CM | POA: Diagnosis not present

## 2015-05-15 DIAGNOSIS — M0609 Rheumatoid arthritis without rheumatoid factor, multiple sites: Secondary | ICD-10-CM | POA: Diagnosis not present

## 2015-05-15 DIAGNOSIS — M8589 Other specified disorders of bone density and structure, multiple sites: Secondary | ICD-10-CM | POA: Diagnosis not present

## 2015-05-15 NOTE — Telephone Encounter (Signed)
Merle called and was wanting to know if you have revewied her notes from her rheumatologist said she has called every week about his, said she went and seen the rheumatologist today and they was wondering why you haven't called them to consult with them, please advise pt can be reached at [phone] (H)

## 2015-05-16 NOTE — Telephone Encounter (Signed)
I have reviewed a lot of her records.  She has been back to Dr. Charlestine Night for follow up.      This is a unique situation where her long term rheumatologist felt she had seronegative rheumatoid disease despite not having lab + data, but based on symptoms.  Obviously Dr. Elmon Else assessment is much different.  I trust Dr. Charlestine Night, and I think he gave her good advice and honest opinion.  Sometimes physicians are wrong, but in general physicians make treatment plans based on patient's complaints or symptom history.     I think it is worthwhile her calling her old rheumatologist to explain the big difference in clinical assessment and ask them why Dr. Elmon Else assessment is very different than her old rheumatologist assessment and get an explanation from them.     Not sure exactly what she is wanting me to do.   I would go with Dr. Josem Kaufmann treatment recommendations, but to get closure or clarification, she should call her prior rheumatology office to discuss.   The bottom line is that she doesn't have erosions of her joints, no major deformities of her joints, she fortunately doesn't have any obvious liver or kidney damage, and in general I'd rather have osteoarthritis than the complications that come with rheumatoid arthritis.

## 2015-05-16 NOTE — Telephone Encounter (Signed)
LMTCB

## 2015-05-19 NOTE — Telephone Encounter (Signed)
Spoke with pt. She said that she called her old rheumatologist and they know she has a new rheumatologist. She said that she will be seeing dr truslow in 4 months. Said she isnt going to call her old rheumatologist.

## 2015-05-29 ENCOUNTER — Ambulatory Visit: Payer: Medicare Other | Admitting: Medical

## 2015-05-29 ENCOUNTER — Ambulatory Visit: Payer: Self-pay | Admitting: Medical

## 2015-06-24 DIAGNOSIS — M545 Low back pain: Secondary | ICD-10-CM | POA: Diagnosis not present

## 2015-06-24 DIAGNOSIS — M5137 Other intervertebral disc degeneration, lumbosacral region: Secondary | ICD-10-CM | POA: Diagnosis not present

## 2015-06-24 DIAGNOSIS — M5416 Radiculopathy, lumbar region: Secondary | ICD-10-CM | POA: Diagnosis not present

## 2015-07-17 ENCOUNTER — Encounter: Payer: Self-pay | Admitting: Medical

## 2015-07-17 ENCOUNTER — Ambulatory Visit (INDEPENDENT_AMBULATORY_CARE_PROVIDER_SITE_OTHER): Payer: Medicare HMO | Admitting: Medical

## 2015-07-17 VITALS — BP 128/84 | HR 68 | Temp 97.6°F | Wt 238.0 lb

## 2015-07-17 DIAGNOSIS — R499 Unspecified voice and resonance disorder: Secondary | ICD-10-CM

## 2015-07-17 DIAGNOSIS — R69 Illness, unspecified: Secondary | ICD-10-CM | POA: Diagnosis not present

## 2015-07-17 DIAGNOSIS — M542 Cervicalgia: Secondary | ICD-10-CM | POA: Diagnosis not present

## 2015-07-17 DIAGNOSIS — Z114 Encounter for screening for human immunodeficiency virus [HIV]: Secondary | ICD-10-CM | POA: Diagnosis not present

## 2015-07-17 DIAGNOSIS — F32A Depression, unspecified: Secondary | ICD-10-CM

## 2015-07-17 DIAGNOSIS — F418 Other specified anxiety disorders: Secondary | ICD-10-CM

## 2015-07-17 DIAGNOSIS — Z113 Encounter for screening for infections with a predominantly sexual mode of transmission: Secondary | ICD-10-CM

## 2015-07-17 DIAGNOSIS — I10 Essential (primary) hypertension: Secondary | ICD-10-CM | POA: Diagnosis not present

## 2015-07-17 DIAGNOSIS — E118 Type 2 diabetes mellitus with unspecified complications: Secondary | ICD-10-CM | POA: Diagnosis not present

## 2015-07-17 DIAGNOSIS — E663 Overweight: Secondary | ICD-10-CM

## 2015-07-17 DIAGNOSIS — G4700 Insomnia, unspecified: Secondary | ICD-10-CM

## 2015-07-17 DIAGNOSIS — Z79899 Other long term (current) drug therapy: Secondary | ICD-10-CM | POA: Diagnosis not present

## 2015-07-17 DIAGNOSIS — F419 Anxiety disorder, unspecified: Secondary | ICD-10-CM

## 2015-07-17 DIAGNOSIS — F329 Major depressive disorder, single episode, unspecified: Secondary | ICD-10-CM

## 2015-07-17 DIAGNOSIS — Z1159 Encounter for screening for other viral diseases: Secondary | ICD-10-CM | POA: Diagnosis not present

## 2015-07-17 LAB — LIPID PANEL
Cholesterol: 174 mg/dL (ref 125–200)
HDL: 46 mg/dL (ref >=46)
LDL Cholesterol: 92 mg/dL (ref <130)
Total CHOL/HDL Ratio: 3.8 Ratio (ref <=5.0)
Triglycerides: 180 mg/dL — ABNORMAL HIGH (ref <150)
VLDL: 36 mg/dL — ABNORMAL HIGH (ref <30)

## 2015-07-17 LAB — CBC WITH DIFFERENTIAL/PLATELET
Basophils Absolute: 77 cells/uL (ref 0–200)
Basophils Relative: 1 %
Eosinophils Absolute: 539 cells/uL — ABNORMAL HIGH (ref 15–500)
Eosinophils Relative: 7 %
HCT: 38.2 % (ref 35.0–45.0)
Hemoglobin: 12.6 g/dL (ref 11.7–15.5)
Lymphocytes Relative: 33 %
Lymphs Abs: 2541 cells/uL (ref 850–3900)
MCH: 30.4 pg (ref 27.0–33.0)
MCHC: 33 g/dL (ref 32.0–36.0)
MCV: 92.3 fL (ref 80.0–100.0)
MPV: 10.8 fL (ref 7.5–12.5)
Monocytes Absolute: 693 cells/uL (ref 200–950)
Monocytes Relative: 9 %
Neutro Abs: 3850 cells/uL (ref 1500–7800)
Neutrophils Relative %: 50 %
Platelets: 352 10*3/uL (ref 140–400)
RBC: 4.14 MIL/uL (ref 3.80–5.10)
RDW: 13.3 % (ref 11.0–15.0)
WBC: 7.7 10*3/uL (ref 4.0–10.5)

## 2015-07-17 LAB — COMPREHENSIVE METABOLIC PANEL
ALT: 24 U/L (ref 6–29)
AST: 20 U/L (ref 10–35)
Albumin: 4.2 g/dL (ref 3.6–5.1)
Alkaline Phosphatase: 67 U/L (ref 33–130)
BUN: 16 mg/dL (ref 7–25)
CO2: 22 mmol/L (ref 20–31)
Calcium: 9.2 mg/dL (ref 8.6–10.4)
Chloride: 102 mmol/L (ref 98–110)
Creat: 1.15 mg/dL — ABNORMAL HIGH (ref 0.50–0.99)
Glucose, Bld: 100 mg/dL — ABNORMAL HIGH (ref 65–99)
Potassium: 4.8 mmol/L (ref 3.5–5.3)
Sodium: 142 mmol/L (ref 135–146)
Total Bilirubin: 0.3 mg/dL (ref 0.2–1.2)
Total Protein: 7.2 g/dL (ref 6.1–8.1)

## 2015-07-17 LAB — T4, FREE: Free T4: 1.3 ng/dL (ref 0.8–1.8)

## 2015-07-17 LAB — HEMOGLOBIN A1C
Hgb A1c MFr Bld: 6.2 % — ABNORMAL HIGH (ref <5.7)
Mean Plasma Glucose: 131 mg/dL

## 2015-07-17 LAB — HEPATITIS C ANTIBODY: HCV Ab: NEGATIVE

## 2015-07-17 LAB — TSH: TSH: 1.76 mIU/L

## 2015-07-17 MED ORDER — ALPRAZOLAM 0.5 MG PO TABS: 0.5000 mg | ORAL_TABLET | Freq: Every evening | ORAL | Status: DC | PRN

## 2015-07-17 NOTE — Progress Notes (Signed)
Subjective: Chief Complaint  Patient presents with  . Sore Throat    and wanted her fasting labs done today. has a knot on her neck and is starting to hurt her. wants to be checked for hep c. 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  Last STD/HIV tests was years ago before she married current husband  Has a knot in her left neck.   Feels a lump.  Some trouble swallowing.  No fever, no other lymph.   Sometime voice disappear.   insomnia - needs refill on Xanax, wants me to take over this medication.     Anxiety, depression - has a lot of stress as husband's health is declining and she has to basically do everything around the house  Here for recheck on statin started last visit, diabetes, and medications.    Past Medical History  Diagnosis Date  . Rheumatoid arthritis(714.0)   . Overweight(278.02) 06/11/2011  . Depression with anxiety 02/22/2008    Qualifier: Diagnosis of  By: Nolon Rod CMA (AAMA), Robin    . GERD (gastroesophageal reflux disease) 06/11/2011  . Colon polyp 06/11/2011    Most recent 04/23/08, no polyp--mild diverticulosis.  Repeat 10 yrs.  . Hypertension     Recently diagnosed  . Chronic constipation   . PUD (peptic ulcer disease) 04/23/08    Multiple antrum ulcers; h pylori neg  . Migraine syndrome   . Cluster headache syndrome 04/27/13   ROS as in subjective  Objective: BP 128/84 mmHg  Pulse 68  Temp(Src) 97.6 F (36.4 C) (Tympanic)  Wt 238 lb (107.956 kg)  General appearance: alert, no distress, WD/WN HEENT: normocephalic, sclerae anicteric, TMs pearly, nares patent, no discharge or erythema, pharynx normal Oral cavity: MMM, no lesions Neck: supple, no lymphadenopathy, slight fullness in left submandibular are but no distinct mass or lymphadenopathy, no thyromegaly, no masses Heart: RRR, normal S1, S2, no murmurs Lungs: CTA bilaterally, no wheezes, rhonchi,  or rales Pulses: 2+ symmetric, upper and lower extremities, normal cap refill Ext: no edema     Assessment: Encounter Diagnoses  Name Primary?  . Diabetes mellitus with complication ([place]) Yes  . Need for hepatitis C screening test   . Screen for STD (sexually transmitted disease)   . Anxiety and depression   . Insomnia   . High risk medication use   . Essential hypertension   . Neck discomfort   . Change in voice   . Overweight   . Screening for HIV (human immunodeficiency virus)      Plan: Diabetes type 2- labs today, currently not check sugars, but advised to start doing this, work on healthy diet and exercise  Hep C and HIV screen at her request  Anxiety and depression - c/t current medication  Insomnia - practice good sleep hygiene, c/t Xanax prn QHS for sleep  Routine labs today given high risk med use  Neck discomfort, change in voice - labs, but consider ENT consult  Overweight , diabetes - she wants weight loss medication.  discussed the limitations of the available medications.  F/u pending labs  Emma [name] was seen today for sore throat.  Diagnoses and all orders for this visit:  Diabetes mellitus with complication ([place]) -     Lipid panel -     Comprehensive metabolic panel -     CBC with Differential/Platelet -  Hemoglobin A1c -     TSH -     T4, free  Need for hepatitis C screening test -     Hepatitis C antibody -     HIV antibody -     RPR  Screen for STD (sexually transmitted disease) -     Hepatitis C antibody -     HIV antibody -     RPR  Anxiety and depression -     Lipid panel -     Comprehensive metabolic panel -     CBC with Differential/Platelet -     Hemoglobin A1c -     TSH -     T4, free  Insomnia -     Lipid panel -     Comprehensive metabolic panel -     CBC with Differential/Platelet -     Hemoglobin A1c  High risk medication use -     Lipid panel -     Comprehensive metabolic panel -     CBC with  Differential/Platelet -     Hemoglobin A1c -     TSH -     T4, free  Essential hypertension -     Lipid panel -     Comprehensive metabolic panel -     CBC with Differential/Platelet -     Hemoglobin A1c -     TSH -     T4, free  Neck discomfort -     TSH -     T4, free  Change in voice -     TSH -     T4, free  Overweight -     TSH -     T4, free  Screening for HIV (human immunodeficiency virus) -     HIV antibody  Other orders -     ALPRAZolam (XANAX) 0.5 MG tablet; Take 1 tablet (0.5 mg total) by mouth at bedtime as needed. 2 to 3 tablets per night

## 2015-07-18 ENCOUNTER — Other Ambulatory Visit: Payer: Self-pay | Admitting: *Deleted

## 2015-07-18 ENCOUNTER — Other Ambulatory Visit: Payer: Self-pay | Admitting: Medical

## 2015-07-18 DIAGNOSIS — R221 Localized swelling, mass and lump, neck: Secondary | ICD-10-CM

## 2015-07-18 LAB — HIV ANTIBODY (ROUTINE TESTING W REFLEX): HIV 1&2 Ab, 4th Generation: NONREACTIVE

## 2015-07-18 LAB — RPR

## 2015-07-18 MED ORDER — METFORMIN HCL 500 MG PO TABS: 500.0000 mg | ORAL_TABLET | Freq: Every day | ORAL | Status: DC

## 2015-07-18 MED ORDER — PRAVASTATIN SODIUM 20 MG PO TABS: 20.0000 mg | ORAL_TABLET | Freq: Every day | ORAL | Status: DC

## 2015-07-18 MED ORDER — LISINOPRIL 20 MG PO TABS: 20.0000 mg | ORAL_TABLET | Freq: Every day | ORAL | Status: DC

## 2015-07-18 MED ORDER — GABAPENTIN 300 MG PO CAPS: ORAL_CAPSULE | ORAL | Status: DC

## 2015-07-18 MED ORDER — INJECTION DEVICE FOR INSULIN DEVI: 1.0000 | Freq: Once | Status: DC

## 2015-07-18 MED ORDER — LIRAGLUTIDE 18 MG/3ML ~~LOC~~ SOPN: 1.8000 mg | PEN_INJECTOR | Freq: Every day | SUBCUTANEOUS | Status: DC

## 2015-07-22 ENCOUNTER — Other Ambulatory Visit: Payer: Self-pay | Admitting: *Deleted

## 2015-07-22 MED ORDER — PEN NEEDLES 31G X 8 MM MISC: 1.0000 | Freq: Every day | Status: DC

## 2015-07-22 MED ORDER — LIRAGLUTIDE 18 MG/3ML ~~LOC~~ SOPN: 1.8000 mg | PEN_INJECTOR | Freq: Every day | SUBCUTANEOUS | Status: DC

## 2015-07-23 ENCOUNTER — Ambulatory Visit
Admission: RE | Admit: 2015-07-23 | Discharge: 2015-07-23 | Disposition: A | Discharge status: Home / Self Care | Payer: Medicare HMO | Source: Ambulatory Visit | Attending: Medical | Admitting: Medical

## 2015-07-23 DIAGNOSIS — R221 Localized swelling, mass and lump, neck: Secondary | ICD-10-CM | POA: Diagnosis not present

## 2015-07-28 ENCOUNTER — Telehealth: Payer: Self-pay

## 2015-07-28 NOTE — Telephone Encounter (Signed)
pts rx for Victoza was sent in for only 20 days, asked for a sample of Victoza to last the remaining 10 days. We had sample so given to pt

## 2015-08-13 ENCOUNTER — Telehealth: Payer: Self-pay

## 2015-08-13 NOTE — Telephone Encounter (Signed)
Pt called and LM that her victoza was filled wrong again. I called pharmacy and they stated that they did not catch the mistake when they filled the prior RX and that she would have her manager call me tomorrow to let us know what could be done for the pt now that she is a pen short of a month supply.

## 2015-08-19 ENCOUNTER — Ambulatory Visit: Payer: Self-pay | Admitting: Medical

## 2015-08-22 ENCOUNTER — Ambulatory Visit: Payer: Medicare Other | Admitting: Medical

## 2015-09-01 ENCOUNTER — Other Ambulatory Visit: Payer: Self-pay | Admitting: Medical

## 2015-09-01 NOTE — Telephone Encounter (Signed)
Call out xanax

## 2015-09-01 NOTE — Telephone Encounter (Signed)
Phoned in.

## 2015-09-01 NOTE — Telephone Encounter (Signed)
Is this ok to refill?  

## 2015-09-05 ENCOUNTER — Ambulatory Visit (INDEPENDENT_AMBULATORY_CARE_PROVIDER_SITE_OTHER): Payer: Medicare HMO | Admitting: Medical

## 2015-09-05 ENCOUNTER — Encounter: Payer: Self-pay | Admitting: Medical

## 2015-09-05 VITALS — BP 110/70 | HR 79 | Wt 238.0 lb

## 2015-09-05 DIAGNOSIS — Z636 Dependent relative needing care at home: Secondary | ICD-10-CM | POA: Diagnosis not present

## 2015-09-05 DIAGNOSIS — I10 Essential (primary) hypertension: Secondary | ICD-10-CM

## 2015-09-05 DIAGNOSIS — E118 Type 2 diabetes mellitus with unspecified complications: Secondary | ICD-10-CM

## 2015-09-05 DIAGNOSIS — R69 Illness, unspecified: Secondary | ICD-10-CM | POA: Diagnosis not present

## 2015-09-05 DIAGNOSIS — E669 Obesity, unspecified: Secondary | ICD-10-CM

## 2015-09-05 NOTE — Progress Notes (Signed)
Subjective: Chief Complaint  Patient presents with  . Follow-up    on victoza, said that if she eats [place] food she now gets diarrhea. no problems or concerns.    Here for f/u on Victoza, weight, glucose.  She does note decreased appetite on Victoza, denies any nausea, but does get diarrhea from time to time.  Feels like our scales are showing different than hers at home.  Started 238lb with Victoza and on her scale down to 231lb.   Not checking glucose currently.  Has meter at home.  Is exercising - taking care of husband.  Afraid to leave him.   Diet - eating healthy, avoiding sweets and fried foods.  Has had some issues with loose stool in the last month.  But seems to be related to [place] and [place] food.  No other aggravating or relieving factors. No other complaint.   Past Medical History  Diagnosis Date  . Rheumatoid arthritis(714.0)   . Overweight(278.02) 06/11/2011  . Depression with anxiety 02/22/2008    Qualifier: Diagnosis of  By: Nolon Rod CMA (AAMA), Robin    . GERD (gastroesophageal reflux disease) 06/11/2011  . Colon polyp 06/11/2011    Most recent 04/23/08, no polyp--mild diverticulosis.  Repeat 10 yrs.  . Hypertension     Recently diagnosed  . Chronic constipation   . PUD (peptic ulcer disease) 04/23/08    Multiple antrum ulcers; h pylori neg  . Migraine syndrome   . Cluster headache syndrome 04/27/13   ROS as in subjective   Objective: BP 110/70 mmHg  Pulse 79  Wt 238 lb (107.956 kg)  Wt Readings from Last 3 Encounters:  09/05/15 238 lb (107.956 kg)  07/17/15 238 lb (107.956 kg)  04/04/15 235 lb (106.595 kg)   Gen: wd, wn, nad Psych: pleasant, good eye contact, answers questions approprietly      Assessment: Encounter Diagnoses  Name Primary?  . Diabetes mellitus with complication ([place]) Yes  . Obesity   . Essential hypertension   . Caregiver burden     Plan: discussed her diet, exercise, concerns, caregiver for her husband.   Trial of samples of  Trulicity just to see if any improvement.   May end up stopping Victoza.   discussed weight loss goals,medications.  C/t rest of medications unchanged.

## 2015-09-11 DIAGNOSIS — M19041 Primary osteoarthritis, right hand: Secondary | ICD-10-CM | POA: Diagnosis not present

## 2015-09-11 DIAGNOSIS — Z79899 Other long term (current) drug therapy: Secondary | ICD-10-CM | POA: Diagnosis not present

## 2015-09-11 DIAGNOSIS — M19042 Primary osteoarthritis, left hand: Secondary | ICD-10-CM | POA: Diagnosis not present

## 2015-09-11 DIAGNOSIS — M0570 Rheumatoid arthritis with rheumatoid factor of unspecified site without organ or systems involvement: Secondary | ICD-10-CM | POA: Diagnosis not present

## 2015-09-11 DIAGNOSIS — M2550 Pain in unspecified joint: Secondary | ICD-10-CM | POA: Diagnosis not present

## 2015-10-02 ENCOUNTER — Other Ambulatory Visit: Payer: Self-pay | Admitting: Medical

## 2015-10-16 DIAGNOSIS — Z803 Family history of malignant neoplasm of breast: Secondary | ICD-10-CM | POA: Diagnosis not present

## 2015-10-16 DIAGNOSIS — Z1231 Encounter for screening mammogram for malignant neoplasm of breast: Secondary | ICD-10-CM | POA: Diagnosis not present

## 2015-10-16 LAB — HM MAMMOGRAPHY

## 2015-10-20 ENCOUNTER — Other Ambulatory Visit: Payer: Self-pay | Admitting: Medical

## 2015-10-20 ENCOUNTER — Encounter: Payer: Self-pay | Admitting: Medical

## 2015-10-20 NOTE — Telephone Encounter (Signed)
Is this okay to call in? 

## 2015-10-20 NOTE — Telephone Encounter (Signed)
Call out with no refill 

## 2015-10-21 NOTE — Telephone Encounter (Signed)
Phoned in.

## 2015-11-04 DIAGNOSIS — M5416 Radiculopathy, lumbar region: Secondary | ICD-10-CM | POA: Diagnosis not present

## 2015-11-04 DIAGNOSIS — M5137 Other intervertebral disc degeneration, lumbosacral region: Secondary | ICD-10-CM | POA: Diagnosis not present

## 2015-11-04 DIAGNOSIS — M545 Low back pain: Secondary | ICD-10-CM | POA: Diagnosis not present

## 2015-11-19 ENCOUNTER — Ambulatory Visit: Payer: Medicare Other | Admitting: Neurology

## 2015-11-19 ENCOUNTER — Other Ambulatory Visit: Payer: Self-pay | Admitting: Medical

## 2015-11-19 NOTE — Telephone Encounter (Signed)
Is this ok to refill?  

## 2015-11-19 NOTE — Telephone Encounter (Signed)
Call out medication 

## 2015-11-20 NOTE — Telephone Encounter (Signed)
done

## 2015-12-04 ENCOUNTER — Other Ambulatory Visit: Payer: Self-pay | Admitting: Neurology

## 2015-12-08 ENCOUNTER — Other Ambulatory Visit: Payer: Self-pay

## 2015-12-08 MED ORDER — VERAPAMIL HCL ER 240 MG PO TBCR: 240.0000 mg | EXTENDED_RELEASE_TABLET | Freq: Every day | ORAL | 0 refills | Status: DC

## 2015-12-16 ENCOUNTER — Other Ambulatory Visit: Payer: Self-pay | Admitting: Medical

## 2015-12-16 NOTE — Telephone Encounter (Signed)
Is this okay to refill? 

## 2015-12-17 NOTE — Telephone Encounter (Signed)
Call this out

## 2015-12-17 NOTE — Telephone Encounter (Signed)
Called in med to pharmacy  

## 2015-12-17 NOTE — Addendum Note (Signed)
Addended by: Milta Deiters on: 12/17/2015 09:40 AM   Modules accepted: Orders

## 2015-12-22 ENCOUNTER — Encounter: Payer: Self-pay | Admitting: Medical

## 2015-12-22 ENCOUNTER — Ambulatory Visit (INDEPENDENT_AMBULATORY_CARE_PROVIDER_SITE_OTHER): Payer: Medicare HMO | Admitting: Medical

## 2015-12-22 ENCOUNTER — Encounter: Payer: Self-pay | Admitting: Gastroenterology

## 2015-12-22 VITALS — BP 168/84 | HR 74 | Ht 69.0 in | Wt 235.0 lb

## 2015-12-22 DIAGNOSIS — F411 Generalized anxiety disorder: Secondary | ICD-10-CM

## 2015-12-22 DIAGNOSIS — Z79899 Other long term (current) drug therapy: Secondary | ICD-10-CM

## 2015-12-22 DIAGNOSIS — Z23 Encounter for immunization: Secondary | ICD-10-CM | POA: Insufficient documentation

## 2015-12-22 DIAGNOSIS — K219 Gastro-esophageal reflux disease without esophagitis: Secondary | ICD-10-CM | POA: Diagnosis not present

## 2015-12-22 DIAGNOSIS — M2550 Pain in unspecified joint: Secondary | ICD-10-CM | POA: Insufficient documentation

## 2015-12-22 DIAGNOSIS — E669 Obesity, unspecified: Secondary | ICD-10-CM | POA: Diagnosis not present

## 2015-12-22 DIAGNOSIS — K5909 Other constipation: Secondary | ICD-10-CM | POA: Diagnosis not present

## 2015-12-22 DIAGNOSIS — I10 Essential (primary) hypertension: Secondary | ICD-10-CM | POA: Diagnosis not present

## 2015-12-22 DIAGNOSIS — K635 Polyp of colon: Secondary | ICD-10-CM

## 2015-12-22 DIAGNOSIS — R69 Illness, unspecified: Secondary | ICD-10-CM | POA: Diagnosis not present

## 2015-12-22 DIAGNOSIS — E118 Type 2 diabetes mellitus with unspecified complications: Secondary | ICD-10-CM | POA: Diagnosis not present

## 2015-12-22 LAB — COMPREHENSIVE METABOLIC PANEL
ALT: 32 U/L — ABNORMAL HIGH (ref 6–29)
AST: 17 U/L (ref 10–35)
Albumin: 3.8 g/dL (ref 3.6–5.1)
Alkaline Phosphatase: 67 U/L (ref 33–130)
BUN: 13 mg/dL (ref 7–25)
CO2: 22 mmol/L (ref 20–31)
Calcium: 8.5 mg/dL — ABNORMAL LOW (ref 8.6–10.4)
Chloride: 109 mmol/L (ref 98–110)
Creat: 1.18 mg/dL — ABNORMAL HIGH (ref 0.50–0.99)
Glucose, Bld: 184 mg/dL — ABNORMAL HIGH (ref 65–99)
Potassium: 4 mmol/L (ref 3.5–5.3)
Sodium: 142 mmol/L (ref 135–146)
Total Bilirubin: 0.2 mg/dL (ref 0.2–1.2)
Total Protein: 6.2 g/dL (ref 6.1–8.1)

## 2015-12-22 NOTE — Progress Notes (Signed)
Subjective: Chief Complaint  Patient presents with  . Follow-up    diabetic follow up,    Here for f/u on diabetes.   Taking Metformin 500mg  daily.  Taking Victoza daily.  Uses husband's glucometer, and recently her sugars have been ok.  Walks her dog for exercise, usually 30 minutes daily.    Not eating healthy.    Regarding BP elevation today, didn't take BP medication this morning.  Was up most of the night with a headache, and this morning roofer's started at 7am which didn't help her headaches .   Decided not to go back to Dr. Charlestine Night, didn't feel that he was listening to her or helping.  She wants some repeat rheum labs today.    Has questions about vaccines.  Has ongoing concerns about chronic constipation.  Wants to go back to GI due to significant ongoing constipation.      Past Medical History:  Diagnosis Date  . Chronic constipation   . Cluster headache syndrome 04/27/13  . Colon polyp 06/11/2011   Most recent 04/23/08, no polyp--mild diverticulosis.  Repeat 10 yrs.  . Depression with anxiety 02/22/2008   Qualifier: Diagnosis of  By: Nolon Rod CMA (AAMA), Robin    . GERD (gastroesophageal reflux disease) 06/11/2011  . Hypertension    Recently diagnosed  . Migraine syndrome   . Overweight(278.02) 06/11/2011  . PUD (peptic ulcer disease) 04/23/08   Multiple antrum ulcers; h pylori neg  . Rheumatoid arthritis(714.0)    Current Outpatient Prescriptions on File Prior to Visit  Medication Sig Dispense Refill  . ALPRAZolam (XANAX) 0.5 MG tablet TAKE 1 TABLET BY MOUTH EVERY NIGHT AT BEDTIME AS NEEDED FOR SLEEP. MAY TAKE 2-3 TABLETS BY MOUTH AT BEDTIME 50 tablet 0  . etodolac (LODINE) 400 MG tablet Take 400 mg by mouth as needed.     . gabapentin (NEURONTIN) 300 MG capsule TAKE (1) CAPSULE THREE TIMES DAILY. 90 capsule 5  . HYDROcodone-acetaminophen (NORCO) 7.5-325 MG per tablet Take 1 tablet by mouth as needed. Reported on 09/05/2015    . injection device for insulin (B-D PEN) DEVI  1 each by Other route once. 100 each 5  . Insulin Pen Needle (PEN NEEDLES) 31G X 8 MM MISC 1 each by Does not apply route daily. 100 each 0  . Liraglutide (VICTOZA) 18 MG/3ML SOPN Inject 0.3 mLs (1.8 mg total) into the skin at bedtime. 9 mL 1  . lisinopril (PRINIVIL,ZESTRIL) 20 MG tablet Take 1 tablet (20 mg total) by mouth daily. 90 tablet 3  . metFORMIN (GLUCOPHAGE) 500 MG tablet Take 1 tablet (500 mg total) by mouth daily with breakfast. 90 tablet 1  . omeprazole (PRILOSEC) 40 MG capsule TAKE 1 CAPSULE(40 MG) BY MOUTH DAILY 90 capsule 1  . pravastatin (PRAVACHOL) 20 MG tablet Take 1 tablet (20 mg total) by mouth daily. 90 tablet 3  . raloxifene (EVISTA) 60 MG tablet Take 1 tablet (60 mg total) by mouth daily. 30 tablet 11  . rizatriptan (MAXALT-MLT) 10 MG disintegrating tablet Take 1 tablet (10 mg total) by mouth as needed for migraine. May repeat in 2 hours if needed 20 tablet 11  . verapamil (CALAN-SR) 240 MG CR tablet Take 1 tablet (240 mg total) by mouth at bedtime. 30 tablet 0   No current facility-administered medications on file prior to visit.    Past Surgical History:  Procedure Laterality Date  . ABDOMINAL HYSTERECTOMY    . APPENDECTOMY    . BACK SURGERY  3 times  . CARDIOVASCULAR STRESS TEST  06/22/11   Normal; EF 65%  . CHOLECYSTECTOMY    . COLONOSCOPY  04/23/2008   mild diverticulosis, internal hemorrhoids; Dr. Erskine Emery  . LIVER BIOPSY  03/2007   Chronic active hepatitis w/fibrosis--related to hx of methotrexate treatment.   ROS as in subjective   Objective: BP (!) 168/84   Pulse 74   Ht 5\' 9"  (1.753 m)   Wt 235 lb (106.6 kg)   SpO2 98%   BMI 34.70 kg/m   BP Readings from Last 3 Encounters:  12/22/15 (!) 168/84  09/05/15 110/70  07/17/15 128/84   Wt Readings from Last 3 Encounters:  12/22/15 235 lb (106.6 kg)  09/05/15 238 lb (108 kg)  07/17/15 238 lb (108 kg)    General appearance: alert, no distress, WD/WN HEENT: normocephalic, sclerae  anicteric, TMs pearly, nares patent, no discharge or erythema, pharynx normal Oral cavity: MMM, no lesions Neck: supple, no lymphadenopathy, no thyromegaly, no masses Heart: RRR, normal S1, S2, no murmurs Lungs: CTA bilaterally, no wheezes, rhonchi, or rales Pulses: 2+ symmetric, upper and lower extremities, normal cap refill    Diabetic Foot Exam - Simple   Simple Foot Form Diabetic Foot exam was performed with the following findings:  Yes 12/22/2015  9:39 PM  Visual Inspection See comments:  Yes Sensation Testing Intact to touch and monofilament testing bilaterally:  Yes Pulse Check Posterior Tibialis and Dorsalis pulse intact bilaterally:  Yes Comments Splotchy pinkish/red blanchable spots throughout lateral medial feet below ankle including over achilles region, unchanged per patient.        Assessment: Encounter Diagnoses  Name Primary?  . Diabetes mellitus with complication ([place]) Yes  . Essential hypertension   . Anxiety state   . Obesity, unspecified classification, unspecified obesity type, unspecified whether serious comorbidity present   . High risk medication use   . Gastroesophageal reflux disease without esophagitis   . Chronic constipation   . Polyp of colon, unspecified part of colon, unspecified type   . Polyarthralgia   . Need for prophylactic vaccination and inoculation against influenza   . Need for prophylactic vaccination against Streptococcus pneumoniae (pneumococcus)   . Encounter for immunization     Plan: Referral back to GI for chronic constipation.   Advised she work on efforts at [employer] loss, healthier diet, c/t routine exercise.  Labs today.  C/t current medications, daily foot checks, glucose monitoring.  Counseled on the pneumococcal vaccine.  Vaccine information sheet given.  Pneumococcal vaccine Prevnar 13 given after consent obtained.  Counseled on the influenza virus vaccine.  Vaccine information sheet given.  Influenza vaccine given  after consent obtained.  F/u pending labs.  Hollie was seen today for follow-up.  Diagnoses and all orders for this visit:  Diabetes mellitus with complication ([place]) -     HM DIABETES FOOT EXAM -     HM DIABETES EYE EXAM -     Hemoglobin A1c -     Microalbumin / creatinine urine ratio -     Comprehensive metabolic panel  Essential hypertension -     Comprehensive metabolic panel  Anxiety state  Obesity, unspecified classification, unspecified obesity type, unspecified whether serious comorbidity present  High risk medication use -     Comprehensive metabolic panel  Gastroesophageal reflux disease without esophagitis -     Ambulatory referral to Gastroenterology  Chronic constipation -     Ambulatory referral to Gastroenterology  Polyp of colon, unspecified part of colon, unspecified  type -     Ambulatory referral to Gastroenterology  Polyarthralgia -     Sedimentation rate  Need for prophylactic vaccination and inoculation against influenza  Need for prophylactic vaccination against Streptococcus pneumoniae (pneumococcus) -     Pneumococcal conjugate vaccine 13-valent  Encounter for immunization -     HM DIABETES FOOT EXAM -     HM DIABETES EYE EXAM -     Hemoglobin A1c -     Microalbumin / creatinine urine ratio -     Comprehensive metabolic panel -     Ambulatory referral to Gastroenterology -     Sedimentation rate -     Flu Vaccine QUAD 36+ mos IM  Other orders -     Cancel: Liraglutide (VICTOZA) 18 MG/3ML SOPN; Inject 0.3 mLs (1.8 mg total) into the skin at bedtime. -     Sedimentation rate

## 2015-12-23 DIAGNOSIS — Z23 Encounter for immunization: Secondary | ICD-10-CM | POA: Diagnosis not present

## 2015-12-23 DIAGNOSIS — I10 Essential (primary) hypertension: Secondary | ICD-10-CM | POA: Diagnosis not present

## 2015-12-23 DIAGNOSIS — K5909 Other constipation: Secondary | ICD-10-CM | POA: Diagnosis not present

## 2015-12-23 DIAGNOSIS — K635 Polyp of colon: Secondary | ICD-10-CM | POA: Diagnosis not present

## 2015-12-23 DIAGNOSIS — E118 Type 2 diabetes mellitus with unspecified complications: Secondary | ICD-10-CM | POA: Diagnosis not present

## 2015-12-23 DIAGNOSIS — Z79899 Other long term (current) drug therapy: Secondary | ICD-10-CM | POA: Diagnosis not present

## 2015-12-23 DIAGNOSIS — M2550 Pain in unspecified joint: Secondary | ICD-10-CM | POA: Diagnosis not present

## 2015-12-23 DIAGNOSIS — E669 Obesity, unspecified: Secondary | ICD-10-CM | POA: Diagnosis not present

## 2015-12-23 DIAGNOSIS — R69 Illness, unspecified: Secondary | ICD-10-CM | POA: Diagnosis not present

## 2015-12-23 DIAGNOSIS — K219 Gastro-esophageal reflux disease without esophagitis: Secondary | ICD-10-CM | POA: Diagnosis not present

## 2015-12-23 LAB — MICROALBUMIN / CREATININE URINE RATIO
Creatinine, Urine: 108 mg/dL (ref 20–320)
Microalb Creat Ratio: 329 mcg/mg creat — ABNORMAL HIGH (ref <30)
Microalb, Ur: 35.5 mg/dL

## 2015-12-23 LAB — HEMOGLOBIN A1C
Hgb A1c MFr Bld: 5.9 % — ABNORMAL HIGH (ref <5.7)
Mean Plasma Glucose: 123 mg/dL

## 2015-12-23 LAB — SEDIMENTATION RATE: Sed Rate: 12 mm/hr (ref 0–30)

## 2015-12-24 ENCOUNTER — Other Ambulatory Visit: Payer: Self-pay | Admitting: Medical

## 2015-12-24 MED ORDER — VERAPAMIL HCL ER 240 MG PO TBCR: 240.0000 mg | EXTENDED_RELEASE_TABLET | Freq: Every day | ORAL | 3 refills | Status: DC

## 2015-12-24 MED ORDER — LIRAGLUTIDE 18 MG/3ML ~~LOC~~ SOPN: 1.8000 mg | PEN_INJECTOR | Freq: Every day | SUBCUTANEOUS | 3 refills | Status: DC

## 2015-12-24 MED ORDER — PRAVASTATIN SODIUM 20 MG PO TABS: 20.0000 mg | ORAL_TABLET | Freq: Every day | ORAL | 3 refills | Status: DC

## 2015-12-24 MED ORDER — PEN NEEDLES 31G X 8 MM MISC: 1.0000 | Freq: Every day | 11 refills | Status: DC

## 2015-12-24 MED ORDER — GABAPENTIN 300 MG PO CAPS: ORAL_CAPSULE | ORAL | 1 refills | Status: DC

## 2015-12-24 MED ORDER — METFORMIN HCL 500 MG PO TABS: 500.0000 mg | ORAL_TABLET | Freq: Every day | ORAL | 1 refills | Status: DC

## 2015-12-24 MED ORDER — OMEPRAZOLE 40 MG PO CPDR
DELAYED_RELEASE_CAPSULE | ORAL | 3 refills | Status: DC
Start: 2015-12-24 — End: 2016-05-19

## 2015-12-26 DIAGNOSIS — Z23 Encounter for immunization: Secondary | ICD-10-CM | POA: Diagnosis not present

## 2015-12-29 ENCOUNTER — Telehealth: Payer: Self-pay | Admitting: Neurology

## 2015-12-29 NOTE — Telephone Encounter (Signed)
Pt had appt in 10/2015 but cancel on the automatic call system.

## 2015-12-29 NOTE — Telephone Encounter (Signed)
Rn call patient back about her increase headache.Pt was last seen 10/2014 by Dr. Leonie Man. Pt was given maxalt, an pt never follow up for appt. Pt stated could she come in for a shot, or have another medication prescribed. Rn explain its [facility] policy that pts have to be seen to get refills or new meds. Pt has no follow up appts. Rn schedule appt with Carolyn(NP). Rn stated she has to be evaluated for her current issue.

## 2015-12-29 NOTE — Telephone Encounter (Signed)
Patient is calling. She states she has had a migraine for over a week and rizatriptan (MAXALT-MLT) 10 MG disintegrating tablet has not helped. Can another medication be called in? The patient uses [facility] in [place]. Please call patient and discuss.

## 2015-12-30 ENCOUNTER — Encounter: Payer: Self-pay | Admitting: Nurse Practitioner

## 2015-12-30 ENCOUNTER — Ambulatory Visit (INDEPENDENT_AMBULATORY_CARE_PROVIDER_SITE_OTHER): Payer: Medicare HMO | Admitting: Nurse Practitioner

## 2015-12-30 VITALS — BP 130/74 | HR 70 | Ht 69.0 in | Wt 232.2 lb

## 2015-12-30 DIAGNOSIS — Z8669 Personal history of other diseases of the nervous system and sense organs: Secondary | ICD-10-CM | POA: Diagnosis not present

## 2015-12-30 DIAGNOSIS — G4700 Insomnia, unspecified: Secondary | ICD-10-CM

## 2015-12-30 DIAGNOSIS — G44029 Chronic cluster headache, not intractable: Secondary | ICD-10-CM

## 2015-12-30 DIAGNOSIS — R69 Illness, unspecified: Secondary | ICD-10-CM | POA: Diagnosis not present

## 2015-12-30 DIAGNOSIS — F329 Major depressive disorder, single episode, unspecified: Secondary | ICD-10-CM

## 2015-12-30 DIAGNOSIS — F32A Depression, unspecified: Secondary | ICD-10-CM

## 2015-12-30 DIAGNOSIS — F418 Other specified anxiety disorders: Secondary | ICD-10-CM

## 2015-12-30 DIAGNOSIS — F419 Anxiety disorder, unspecified: Secondary | ICD-10-CM

## 2015-12-30 MED ORDER — SUMATRIPTAN SUCCINATE 50 MG PO TABS: 50.0000 mg | ORAL_TABLET | ORAL | 3 refills | Status: DC | PRN

## 2015-12-30 NOTE — Progress Notes (Signed)
[facility]  PATIENT: Emma [name] DOB: May 29, 1955   REASON FOR VISIT: Follow-up for  cluster headaches  HISTORY FROM: Patient    HISTORY OF PRESENT ILLNESS: 60 year old Caucasian female who previously seen Dr. Leonie [name] approximately 3 years ago for left-sided head pain who was lost to followup at that time with persistent left-sided head pain which has worsened in the last year. She taking Topamax 50 mg twice a day without relief. She reports these headaches are sharp , shooting and start behind left eye and shoot to left sideof face and neck may last up to 2 days where she remains in the bed. She has to be in the dark for relief. During that time her mother died.  2011/10/28 (JM): Returns for follow up visit since 07/27/11. Reports she is having headaches 3 days per week, was 2 days per week. She has been taking Topamax 100mg  BID, tolerating well but no relief. She has also been taking gabapentin 100mg  daily, denies any side effects she was concerned about having seizures and not being able to stop the medication. She can not relate any triggers to her headaches. Today she has a severe headache on the left side that started on Sunday and has not had any relief. Reports photophobia and intermittent nausea. She has tried Maxalt in the past but stopped secondary to cost, this was prior to going generic.  She also has history of degenerative disc disease.  04/27/13 (LL): She comes in for worsening headaches. Last here in mid 2013. She has continued to take Gabapentin since that time. But sees no benefit. Headaches are always left sided, temporal and parietal, vascular type which regularly wake her up at 4 am. They are accompanied with left eye injection, without tearing. She often has to stay in dark room most of the day with headache, and nausea often is present. She endorses poor sleep, even with alprazolam, soma, mirapex and lunesta nightly.  08/31/13 (LL): Emma [name] comes in for  follow up for cluster headaches.  She has been doing much better on Verapamil as preventative.  She is sleeping better as well since starting this which is helping as well.  She has not had any severe headaches in 3 months.  She has not had to take Rizatriptan in over a month.  Since last here her sister died suddenly from a PE. Her husband is also seen in this practice for mild cognitive impairment. Update 11/19/2014 PS: She returns for follow-up after last visit more than a year ago. She continues to do well and has only occasional cluster headaches mainly involving the left eye and upon awakening from sleep. She takes Maxalt which works quite well. She remains on Calan SR 240 mg at night is tolerating it well without side effects. The headache is described as constant and severe headache 10/10 in severity but not a complaint of nausea vomiting redness or tearing. She does have sensitivity to light and sound and needs to lie down with the headaches. She has no other complaints. UPDATE 10/10/2017CM EmmaClark, 60 year old female returns for follow-up. She was last seen in the office August 2016. She has history of headaches and she has had her current headache for over a week. Her headaches generally wake her up from the left side and are of a vascular type headache. She occasionally has nausea but no vomiting. She does have sensitivity to light and sound , it helps to lay in a dark room Her prescription for Maxalt ran  out in  August. She says it works sometimes, sometimes not She remains on verapamil 240 daily refilled by her primary care. She is also on Neurontin 300 when necessary, she does not take this every day. Diabetes is in fairly good control at present. She returns for reevaluation. She claims she has a lot of stress in her life taking care of elderly husband who has dementia. REVIEW OF SYSTEMS: Full 14 system review of systems performed and notable only for those listed, all others are neg:    Constitutional: neg  Cardiovascular: neg Ear/Nose/Throat: neg  Skin: neg Eyes: Sensitivity to light Respiratory: neg Gastroitestinal: neg  Hematology/Lymphatic: neg  Endocrine: neg Musculoskeletal: Joint pain, back pain Allergy/Immunology: neg Neurological: Headache Psychiatric: Depression Sleep : Restless legs insomnia   ALLERGIES: Allergies  Allergen Reactions  . Bee Venom Anaphylaxis    HOME MEDICATIONS: Outpatient Medications Prior to Visit  Medication Sig Dispense Refill  . ALPRAZolam (XANAX) 0.5 MG tablet TAKE 1 TABLET BY MOUTH EVERY NIGHT AT BEDTIME AS NEEDED FOR SLEEP. MAY TAKE 2-3 TABLETS BY MOUTH AT BEDTIME 50 tablet 0  . carisoprodol (SOMA) 350 MG tablet Take 350 mg by mouth at bedtime.    . gabapentin (NEURONTIN) 300 MG capsule TAKE (1) CAPSULE THREE TIMES DAILY. 270 capsule 1  . HYDROcodone-acetaminophen (NORCO) 7.5-325 MG per tablet Take 1 tablet by mouth as needed. Reported on 09/05/2015    . injection device for insulin (B-D PEN) DEVI 1 each by Other route once. 100 each 5  . Insulin Pen Needle (PEN NEEDLES) 31G X 8 MM MISC 1 each by Does not apply route daily. 100 each 11  . Liraglutide (VICTOZA) 18 MG/3ML SOPN Inject 0.3 mLs (1.8 mg total) into the skin at bedtime. 9 mL 3  . lisinopril (PRINIVIL,ZESTRIL) 20 MG tablet Take 1 tablet (20 mg total) by mouth daily. 90 tablet 3  . metFORMIN (GLUCOPHAGE) 500 MG tablet Take 1 tablet (500 mg total) by mouth daily with breakfast. 90 tablet 1  . omeprazole (PRILOSEC) 40 MG capsule TAKE 1 CAPSULE(40 MG) BY MOUTH DAILY 90 capsule 3  . pravastatin (PRAVACHOL) 20 MG tablet Take 1 tablet (20 mg total) by mouth daily. 90 tablet 3  . raloxifene (EVISTA) 60 MG tablet Take 1 tablet (60 mg total) by mouth daily. 30 tablet 11  . rizatriptan (MAXALT-MLT) 10 MG disintegrating tablet Take 1 tablet (10 mg total) by mouth as needed for migraine. May repeat in 2 hours if needed 20 tablet 11  . verapamil (CALAN-SR) 240 MG CR tablet Take 1  tablet (240 mg total) by mouth at bedtime. 90 tablet 3   No facility-administered medications prior to visit.     PAST MEDICAL HISTORY: Past Medical History:  Diagnosis Date  . Chronic constipation   . Cluster headache syndrome 04/27/13  . Colon polyp 06/11/2011   Most recent 04/23/08, no polyp--mild diverticulosis.  Repeat 10 yrs.  . Depression with anxiety 02/22/2008   Qualifier: Diagnosis of  By: Nolon Rod CMA (AAMA), Robin    . GERD (gastroesophageal reflux disease) 06/11/2011  . Hypertension    Recently diagnosed  . Migraine syndrome   . Overweight(278.02) 06/11/2011  . PUD (peptic ulcer disease) 04/23/08   Multiple antrum ulcers; h pylori neg  . Rheumatoid arthritis(714.0)     PAST SURGICAL HISTORY: Past Surgical History:  Procedure Laterality Date  . ABDOMINAL HYSTERECTOMY    . APPENDECTOMY    . BACK SURGERY     3 times  . CARDIOVASCULAR STRESS  TEST  06/22/11   Normal; EF 65%  . CHOLECYSTECTOMY    . COLONOSCOPY  04/23/2008   mild diverticulosis, internal hemorrhoids; Dr. Erskine Emery  . LIVER BIOPSY  03/2007   Chronic active hepatitis w/fibrosis--related to hx of methotrexate treatment.    FAMILY HISTORY: Family History  Problem Relation Age of Onset  . Diabetes Mother   . Stroke Mother   . Arthritis Father   . Hypertension Father   . Alcohol abuse Sister   . Cancer Sister     mesothelioma  . Heart disease Sister     cardiomegaly  . Hypertension Sister   . Pulmonary embolism Sister   . Hypertension Brother   . Polycystic ovary syndrome Daughter   . Heart disease Maternal Grandmother   . Heart disease Maternal Grandfather     SOCIAL HISTORY: Social History   Social History  . Marital status: Married    Spouse name: N/A  . Number of children: 1  . Years of education: N/A   Occupational History  . Not on file.   Social History Main Topics  . Smoking status: Never Smoker  . Smokeless tobacco: Never Used  . Alcohol use Yes     Comment: Occasional  .  Drug use: No  . Sexual activity: Yes   Other Topics Concern  . Not on file   Social History Narrative  . No narrative on file     PHYSICAL EXAM  Vitals:   12/30/15 1101  Weight: 232 lb 3.2 oz (105.3 kg)  Height: 5\' 9"  (1.753 m)   Body mass index is 34.29 kg/m.  Generalized: Well developed, Obese female in no acute distress  Head: normocephalic and atraumatic,. Oropharynx benign  Neck: Supple, no carotid bruits  Cardiac: Regular rate rhythm, no murmur  Musculoskeletal: No deformity   Neurological examination   Mentation: Alert oriented to time, place, history taking. Attention span and concentration appropriate. Recent and remote memory intact.  Follows all commands speech and language fluent.   Cranial nerve II-XII: Fundoscopic exam deferred due to headache .Pupils were equal round reactive to light extraocular movements were full, visual field were full on confrontational test. Facial sensation and strength were normal. hearing was intact to finger rubbing bilaterally. Uvula tongue midline. head turning and shoulder shrug were normal and symmetric.Tongue protrusion into cheek strength was normal. Motor: normal bulk and tone, full strength in the BUE, BLE, fine finger movements normal, no pronator drift. No focal weakness Sensory: normal and symmetric to light touch, pinprick, and  Vibration, in the upper and lower extremities Coordination: finger-nose-finger, heel-to-shin bilaterally, no dysmetria Reflexes: Brachioradialis 2/2, biceps 2/2, triceps 2/2, patellar 2/2, Achilles 2/2, plantar responses were flexor bilaterally. Gait and Station: Rising up from seated position without assistance, normal stance,  moderate stride, good arm swing, smooth turning, able to perform tiptoe, and heel walking without difficulty. Tandem gait is steady  DIAGNOSTIC DATA (LABS, IMAGING, TESTING) - I reviewed patient records, labs, notes, testing and imaging myself where available.  Lab Results    Component Value Date   WBC 7.7 07/17/2015   HGB 12.6 07/17/2015   HCT 38.2 07/17/2015   MCV 92.3 07/17/2015   PLT 352 07/17/2015      Component Value Date/Time   NA 142 12/22/2015 1258   K 4.0 12/22/2015 1258   CL 109 12/22/2015 1258   CO2 22 12/22/2015 1258   GLUCOSE 184 (H) 12/22/2015 1258   BUN 13 12/22/2015 1258   CREATININE 1.18 (H) 12/22/2015  1258   CALCIUM 8.5 (L) 12/22/2015 1258   PROT 6.2 12/22/2015 1258   ALBUMIN 3.8 12/22/2015 1258   AST 17 12/22/2015 1258   ALT 32 (H) 12/22/2015 1258   ALKPHOS 67 12/22/2015 1258   BILITOT 0.2 12/22/2015 1258   Lab Results  Component Value Date   CHOL 174 07/17/2015   HDL 46 07/17/2015   LDLCALC 92 07/17/2015   TRIG 180 (H) 07/17/2015   CHOLHDL 3.8 07/17/2015   Lab Results  Component Value Date   HGBA1C 5.9 (H) 12/22/2015    Lab Results  Component Value Date   TSH 1.76 07/17/2015      ASSESSMENT AND PLAN 60 year old Caucasian female with 5 years history of left-sided headache likely cluster headache syndrome with neuroulogic exam normal. She has done well since starting Verapamil, Maxalt does not always work acutely. Patient wakes with her headaches and has had her current headache for 1 week.   Continue verapamil 240 daily Change Maxalt to Imitrex 50 milligrams when necessary acute headache refilled Depacon infusion today with some results  I spent additional 15 minutes in total face to face time with the patient more than 50% of which was spent counseling and coordination of care, reviewing test results reviewing medications and discussing and reviewing the diagnosis of migraine/cluster headache  and further treatment options. Importance of keeping a diary if headaches worsen to include the time of the headache what you're doing any other specific information that would be useful. Discussed stress relief techniques such as deep breathing muscle relaxation mental relaxation to music. Discussed importance of exercise,  regular meals  and sleep. Sleep deprivation can be a headache  trigger Follow-up in 3-4 months Vst time 30 min Dennie Bible, [facility], [facility], APRN  [facility] [address], [place], [place] [address] [phone]

## 2015-12-30 NOTE — Patient Instructions (Signed)
Continue verapamil 240 daily Change Maxalt to Imitrex 50 milligrams when necessary acute headache Depacon infusion today Follow-up in 3-4 months

## 2016-01-06 ENCOUNTER — Other Ambulatory Visit: Payer: Self-pay | Admitting: Medical

## 2016-01-06 NOTE — Telephone Encounter (Signed)
Talmadge [employer] dont see this on her med list is it okay to refill

## 2016-01-08 NOTE — Progress Notes (Signed)
I agree with the above plan 

## 2016-01-21 ENCOUNTER — Telehealth: Payer: Self-pay | Admitting: Nurse Practitioner

## 2016-01-21 NOTE — Telephone Encounter (Signed)
I checked with intrafusion and no saline available to do migraine infusions.  Do you want her to have toradol ? Other?

## 2016-01-21 NOTE — Telephone Encounter (Signed)
I spoke to pt and she will come in tomorrow at 1130 to get injection of toradol.  I gave option of urgent care or even ED if so bad and she did not want to do either of those.  She has  Been taking sumatriptan and that has not been helping. I instructed her in how to take this (2 tabs max per 24hour period.).  She verbalized understanding.

## 2016-01-21 NOTE — Telephone Encounter (Addendum)
Patient called to request infusion for migraine today or tomorrow, migraine x 1 week, states the last infusion helped. Please call [phone].

## 2016-01-21 NOTE — Telephone Encounter (Signed)
Do to IV shortage unable to give migraine cocktail. She can get Torodol or go to urgent care

## 2016-01-22 ENCOUNTER — Ambulatory Visit: Payer: Self-pay

## 2016-01-22 NOTE — Telephone Encounter (Signed)
How much toradol did you want to give pt.  Vial is 60mg .

## 2016-01-22 NOTE — Telephone Encounter (Signed)
You may give 60mg  IM

## 2016-02-03 DIAGNOSIS — M545 Low back pain: Secondary | ICD-10-CM | POA: Diagnosis not present

## 2016-02-03 DIAGNOSIS — M5416 Radiculopathy, lumbar region: Secondary | ICD-10-CM | POA: Diagnosis not present

## 2016-02-03 DIAGNOSIS — M5137 Other intervertebral disc degeneration, lumbosacral region: Secondary | ICD-10-CM | POA: Diagnosis not present

## 2016-02-04 ENCOUNTER — Encounter: Payer: Self-pay | Admitting: Medical

## 2016-02-05 ENCOUNTER — Other Ambulatory Visit: Payer: Self-pay | Admitting: Medical

## 2016-02-05 NOTE — Telephone Encounter (Signed)
Can this patient this ?

## 2016-02-06 NOTE — Telephone Encounter (Signed)
Call out refill

## 2016-02-26 ENCOUNTER — Ambulatory Visit: Payer: Medicare HMO | Admitting: Gastroenterology

## 2016-03-09 ENCOUNTER — Encounter: Payer: Self-pay | Admitting: Medical

## 2016-03-20 ENCOUNTER — Other Ambulatory Visit: Payer: Self-pay | Admitting: Medical

## 2016-03-23 NOTE — Telephone Encounter (Signed)
Called in rx to pharmacy.

## 2016-03-23 NOTE — Telephone Encounter (Signed)
Call it out 

## 2016-03-23 NOTE — Telephone Encounter (Signed)
Can pt have a refill on this 

## 2016-03-31 ENCOUNTER — Ambulatory Visit (INDEPENDENT_AMBULATORY_CARE_PROVIDER_SITE_OTHER): Payer: Medicare HMO | Admitting: Nurse Practitioner

## 2016-03-31 ENCOUNTER — Encounter: Payer: Self-pay | Admitting: Nurse Practitioner

## 2016-03-31 VITALS — BP 136/69 | HR 64 | Ht 69.0 in | Wt 235.0 lb

## 2016-03-31 DIAGNOSIS — G44029 Chronic cluster headache, not intractable: Secondary | ICD-10-CM | POA: Diagnosis not present

## 2016-03-31 DIAGNOSIS — F419 Anxiety disorder, unspecified: Secondary | ICD-10-CM

## 2016-03-31 DIAGNOSIS — R69 Illness, unspecified: Secondary | ICD-10-CM | POA: Diagnosis not present

## 2016-03-31 DIAGNOSIS — F32A Depression, unspecified: Secondary | ICD-10-CM

## 2016-03-31 DIAGNOSIS — F329 Major depressive disorder, single episode, unspecified: Secondary | ICD-10-CM

## 2016-03-31 DIAGNOSIS — F418 Other specified anxiety disorders: Secondary | ICD-10-CM

## 2016-03-31 MED ORDER — SUMATRIPTAN SUCCINATE 100 MG PO TABS: 100.0000 mg | ORAL_TABLET | ORAL | 4 refills | Status: DC | PRN

## 2016-03-31 MED ORDER — DIVALPROEX SODIUM ER 500 MG PO TB24: 500.0000 mg | ORAL_TABLET | Freq: Every day | ORAL | 6 refills | Status: DC

## 2016-03-31 NOTE — Patient Instructions (Addendum)
Change Imitrex to 100mg  when necessary acute migraine  Depakote 500 extended release at that bedtime  Daily Continue verapamil 240 daily Continue Yoga and other exercise for stress release Follow-up in 4 months

## 2016-03-31 NOTE — Progress Notes (Addendum)
[facility]  PATIENT: Emma [name] DOB: May 29, 1955   REASON FOR VISIT: Follow-up for  cluster headaches  HISTORY FROM: Patient    HISTORY OF PRESENT ILLNESS: 61 year old Caucasian female who previously seen Dr. Leonie Man approximately 3 years ago for left-sided head pain who was lost to followup at that time with persistent left-sided head pain which has worsened in the last year. She taking Topamax 50 mg twice a day without relief. She reports these headaches are sharp , shooting and start behind left eye and shoot to left sideof face and neck may last up to 2 days where she remains in the bed. She has to be in the dark for relief. During that time her mother died.  2011/10/28 (JM): Returns for follow up visit since 07/27/11. Reports she is having headaches 3 days per week, was 2 days per week. She has been taking Topamax 100mg  BID, tolerating well but no relief. She has also been taking gabapentin 100mg  daily, denies any side effects she was concerned about having seizures and not being able to stop the medication. She can not relate any triggers to her headaches. Today she has a severe headache on the left side that started on Sunday and has not had any relief. Reports photophobia and intermittent nausea. She has tried Maxalt in the past but stopped secondary to cost, this was prior to going generic.  She also has history of degenerative disc disease.  04/27/13 (LL): She comes in for worsening headaches. Last here in mid 2013. She has continued to take Gabapentin since that time. But sees no benefit. Headaches are always left sided, temporal and parietal, vascular type which regularly wake her up at 4 am. They are accompanied with left eye injection, without tearing. She often has to stay in dark room most of the day with headache, and nausea often is present. She endorses poor sleep, even with alprazolam, soma, mirapex and lunesta nightly.  08/31/13 (LL): [name] comes in for  follow up for cluster headaches.  She has been doing much better on Verapamil as preventative.  She is sleeping better as well since starting this which is helping as well.  She has not had any severe headaches in 3 months.  She has not had to take Rizatriptan in over a month.  Since last here her sister died suddenly from a PE. Her husband is also seen in this practice for mild cognitive impairment. Update 11/19/2014 PS: She returns for follow-up after last visit more than a year ago. She continues to do well and has only occasional cluster headaches mainly involving the left eye and upon awakening from sleep. She takes Maxalt which works quite well. She remains on Calan SR 240 mg at night is tolerating it well without side effects. The headache is described as constant and severe headache 10/10 in severity but not a complaint of nausea vomiting redness or tearing. She does have sensitivity to light and sound and needs to lie down with the headaches. She has no other complaints. UPDATE 10/10/2017CM [name], 61 year old female returns for follow-up. She was last seen in the office August 2016. She has history of headaches and she has had her current headache for over a week. Her headaches generally wake her up from the left side and are of a vascular type headache. She occasionally has nausea but no vomiting. She does have sensitivity to light and sound , it helps to lay in a dark room Her prescription for Maxalt ran  out in  August. She says it works sometimes, sometimes not She remains on verapamil 240 daily refilled by her primary care. She is also on Neurontin 300 when necessary, she does not take this every day. Diabetes is in fairly good control at present. She returns for reevaluation. She claims she has a lot of stress in her life taking care of elderly husband who has dementia.  UPDATE 01/10/2018CM [name], 61 year old female returns for follow-up with a history of headaches. She has a migraine today  with a pain level of 3. She is requesting infusion. When last seen she was given Depacon IV which was beneficial. She is currently on verapamil 240 as a preventive and her Maxalt was changed at her last visit to Imitrex 50 mg prn. She claims she usually has to use 2 tablets so we can increase the dose to 100 mg today. She also takes gabapentin 3 times daily. She claims her diabetes is apparently fairly good control. She continues to be stressed having to take care of her demented husband's condition continues to be progressive Her headaches can wake her up generally on the left side and are of a vascular type headache. She is occasionally nauseated but no vomiting she does have sensitivity to light and sound. She returns for reevaluation REVIEW OF SYSTEMS: Full 14 system review of systems performed and notable only for those listed, all others are neg:  Constitutional: neg  Cardiovascular: neg Ear/Nose/Throat: neg  Skin: neg Eyes: Sensitivity to light Respiratory: neg Gastroitestinal: Constipation  Hematology/Lymphatic: neg  Endocrine: neg Musculoskeletal: Joint pain, back pain Allergy/Immunology: neg Neurological: Headache Psychiatric: Anxiety Sleep : Restless legs    ALLERGIES: Allergies  Allergen Reactions  . Bee Venom Anaphylaxis    HOME MEDICATIONS: Outpatient Medications Prior to Visit  Medication Sig Dispense Refill  . ALPRAZolam (XANAX) 0.5 MG tablet TAKE 1 TABLET BY MOUTH EVERY NIGHT AT BEDTIME 50 tablet 0  . carisoprodol (SOMA) 350 MG tablet Take 350 mg by mouth at bedtime.    . gabapentin (NEURONTIN) 300 MG capsule TAKE (1) CAPSULE THREE TIMES DAILY. 270 capsule 1  . HYDROcodone-acetaminophen (NORCO) 7.5-325 MG per tablet Take 1 tablet by mouth as needed. Reported on 09/05/2015    . injection device for insulin (B-D PEN) DEVI 1 each by Other route once. 100 each 5  . Insulin Pen Needle (PEN NEEDLES) 31G X 8 MM MISC 1 each by Does not apply route daily. 100 each 11  .  Liraglutide (VICTOZA) 18 MG/3ML SOPN Inject 0.3 mLs (1.8 mg total) into the skin at bedtime. 9 mL 3  . lisinopril (PRINIVIL,ZESTRIL) 20 MG tablet Take 1 tablet (20 mg total) by mouth daily. 90 tablet 3  . metFORMIN (GLUCOPHAGE) 500 MG tablet Take 1 tablet (500 mg total) by mouth daily with breakfast. 90 tablet 1  . omeprazole (PRILOSEC) 40 MG capsule TAKE 1 CAPSULE(40 MG) BY MOUTH DAILY 90 capsule 3  . pravastatin (PRAVACHOL) 20 MG tablet Take 1 tablet (20 mg total) by mouth daily. 90 tablet 3  . raloxifene (EVISTA) 60 MG tablet Take 1 tablet (60 mg total) by mouth daily. 30 tablet 11  . SUMAtriptan (IMITREX) 50 MG tablet Take 1 tablet (50 mg total) by mouth every 2 (two) hours as needed for migraine. May repeat in 2 hours if headache persists or recurs. 10 tablet 3  . verapamil (CALAN-SR) 240 MG CR tablet Take 1 tablet (240 mg total) by mouth at bedtime. 90 tablet 3  . verapamil (  CALAN-SR) 240 MG CR tablet TAKE 1 TABLET(240 MG) BY MOUTH AT BEDTIME 30 tablet 0   No facility-administered medications prior to visit.     PAST MEDICAL HISTORY: Past Medical History:  Diagnosis Date  . Chronic constipation   . Cluster headache syndrome 04/27/13  . Colon polyp 06/11/2011   Most recent 04/23/08, no polyp--mild diverticulosis.  Repeat 10 yrs.  . Depression with anxiety 02/22/2008   Qualifier: Diagnosis of  By: Nolon Rod CMA (AAMA), Robin    . GERD (gastroesophageal reflux disease) 06/11/2011  . Hypertension    Recently diagnosed  . Migraine syndrome   . Overweight(278.02) 06/11/2011  . PUD (peptic ulcer disease) 04/23/08   Multiple antrum ulcers; h pylori neg  . Rheumatoid arthritis(714.0)     PAST SURGICAL HISTORY: Past Surgical History:  Procedure Laterality Date  . ABDOMINAL HYSTERECTOMY    . APPENDECTOMY    . BACK SURGERY     3 times  . CARDIOVASCULAR STRESS TEST  06/22/11   Normal; EF 65%  . CHOLECYSTECTOMY    . COLONOSCOPY  04/23/2008   mild diverticulosis, internal hemorrhoids; Dr.  Erskine Emery  . LIVER BIOPSY  03/2007   Chronic active hepatitis w/fibrosis--related to hx of methotrexate treatment.    FAMILY HISTORY: Family History  Problem Relation Age of Onset  . Diabetes Mother   . Stroke Mother   . Arthritis Father   . Hypertension Father   . Alcohol abuse Sister   . Cancer Sister     mesothelioma  . Heart disease Sister     cardiomegaly  . Hypertension Sister   . Pulmonary embolism Sister   . Hypertension Brother   . Polycystic ovary syndrome Daughter   . Heart disease Maternal Grandmother   . Heart disease Maternal Grandfather     SOCIAL HISTORY: Social History   Social History  . Marital status: Married    Spouse name: N/A  . Number of children: 1  . Years of education: N/A   Occupational History  . Not on file.   Social History Main Topics  . Smoking status: Never Smoker  . Smokeless tobacco: Never Used  . Alcohol use Yes     Comment: Occasional  . Drug use: No  . Sexual activity: Yes   Other Topics Concern  . Not on file   Social History Narrative  . No narrative on file     PHYSICAL EXAM  Vitals:   03/31/16 1304  BP: 136/69  Pulse: 64  Weight: 235 lb (106.6 kg)  Height: 5\' 9"  (1.753 m)   Body mass index is 34.7 kg/m.  Generalized: Well developed, Obese female in no acute distress  Head: normocephalic and atraumatic,. Oropharynx benign  Neck: Supple, no carotid bruits  Cardiac: Regular rate rhythm, no murmur  Musculoskeletal: No deformity   Neurological examination   Mentation: Alert oriented to time, place, history taking. Attention span and concentration appropriate. Recent and remote memory intact.  Follows all commands speech and language fluent.   Cranial nerve II-XII: Fundoscopic exam deferred due to headache .Pupils were equal round reactive to light extraocular movements were full, visual field were full on confrontational test. Facial sensation and strength were normal. hearing was intact to finger  rubbing bilaterally. Uvula tongue midline. head turning and shoulder shrug were normal and symmetric.Tongue protrusion into cheek strength was normal. Motor: normal bulk and tone, full strength in the BUE, BLE, fine finger movements normal, no pronator drift. No focal weakness Sensory: normal and symmetric to  light touch, pinprick, and  Vibration, in the upper and lower extremities Coordination: finger-nose-finger, heel-to-shin bilaterally, no dysmetria Reflexes: Brachioradialis 2/2, biceps 2/2, triceps 2/2, patellar 2/2, Achilles 2/2, plantar responses were flexor bilaterally. Gait and Station: Rising up from seated position without assistance, normal stance,  moderate stride, good arm swing, smooth turning, able to perform tiptoe, and heel walking without difficulty. Tandem gait is steady  DIAGNOSTIC DATA (LABS, IMAGING, TESTING) - I reviewed patient records, labs, notes, testing and imaging myself where available.  Lab Results  Component Value Date   WBC 7.7 07/17/2015   HGB 12.6 07/17/2015   HCT 38.2 07/17/2015   MCV 92.3 07/17/2015   PLT 352 07/17/2015      Component Value Date/Time   NA 142 12/22/2015 1258   K 4.0 12/22/2015 1258   CL 109 12/22/2015 1258   CO2 22 12/22/2015 1258   GLUCOSE 184 (H) 12/22/2015 1258   BUN 13 12/22/2015 1258   CREATININE 1.18 (H) 12/22/2015 1258   CALCIUM 8.5 (L) 12/22/2015 1258   PROT 6.2 12/22/2015 1258   ALBUMIN 3.8 12/22/2015 1258   AST 17 12/22/2015 1258   ALT 32 (H) 12/22/2015 1258   ALKPHOS 67 12/22/2015 1258   BILITOT 0.2 12/22/2015 1258   Lab Results  Component Value Date   CHOL 174 07/17/2015   HDL 46 07/17/2015   LDLCALC 92 07/17/2015   TRIG 180 (H) 07/17/2015   CHOLHDL 3.8 07/17/2015   Lab Results  Component Value Date   HGBA1C 5.9 (H) 12/22/2015    Lab Results  Component Value Date   TSH 1.76 07/17/2015      ASSESSMENT AND PLAN 61 year old Caucasian female with over 5 years history of left-sided headache likely  cluster headache syndrome with neuroulogic exam normal. She has done well since starting Verapamil, Imitrex added at last visit, works better than Maxalt.. Patient has a headache today rated at a level 3 on a pain scale of 1-10. The patient is a current patient of Dr. Leonie Man  who is out of the office today . This note is sent to the work in doctor.       PLAN: Change Imitrex to 100mg  when necessary acute migraine Begin  Depakote 500 extended release at  bedtime  Daily Continue verapamil 240 daily Continue Yoga and other exercise for stress release Follow-up in 4 months Keep a record of your headaches I spent 25 minutes  in total face to face time with the patient more than 50% of which was spent counseling and coordination of care, reviewing test results reviewing medications and discussing and reviewing the diagnosis of migraine/cluster headache  and further treatment options. Importance of keeping a diary if headaches worsen to include the time of the headache what you're doing any other specific information that would be useful. Discussed stress relief techniques such as deep breathing muscle relaxation mental relaxation to music. Discussed importance of exercise, regular meals  and sleep. Sleep deprivation can be a headache  trigger Follow-up in 4 months Vst time 25 min Dennie Bible, [facility], [facility], APRN  [facility] [address] [place], [place] [address] [phone]  I reviewed the above note and documentation by the Nurse Practitioner and agree with the history, physical exam, assessment and plan as outlined above. I was immediately available for face-to-face consultation. Star Age, MD, PhD [name])

## 2016-04-13 ENCOUNTER — Other Ambulatory Visit: Payer: Self-pay | Admitting: *Deleted

## 2016-04-13 MED ORDER — SUMATRIPTAN SUCCINATE 100 MG PO TABS: 100.0000 mg | ORAL_TABLET | ORAL | 4 refills | Status: DC | PRN

## 2016-04-13 NOTE — Telephone Encounter (Signed)
Received from [employer], that sumatriptan is on formulary but is subject to quantity limit.  9 tabs/ 30 days covered.  Updated prescription.

## 2016-05-06 ENCOUNTER — Telehealth: Payer: Self-pay | Admitting: Medical

## 2016-05-06 NOTE — Telephone Encounter (Signed)
Received a [employer] form for this pt and called her to set up a Medicare Well Visit. Pt wanted to know if you thought this was necessary. Please advise and I will call pt back.

## 2016-05-06 NOTE — Telephone Encounter (Signed)
Yes we can do a preventive care visit/medicare well check

## 2016-05-07 ENCOUNTER — Other Ambulatory Visit: Payer: Self-pay | Admitting: Medical

## 2016-05-07 NOTE — Telephone Encounter (Signed)
Is this okay looks like it was refilled 04/10/16 # 50

## 2016-05-10 ENCOUNTER — Telehealth: Payer: Self-pay | Admitting: Medical

## 2016-05-10 MED ORDER — ALPRAZOLAM 0.5 MG PO TABS: 0.5000 mg | ORAL_TABLET | Freq: Every day | ORAL | 0 refills | Status: DC

## 2016-05-10 NOTE — Telephone Encounter (Signed)
Recv'd fax 2nd refill request for Alprazolam 0.5mg  #50 to [facility]

## 2016-05-10 NOTE — Telephone Encounter (Signed)
Call out and lets go ahead and get her in for 43min med check

## 2016-05-10 NOTE — Telephone Encounter (Signed)
Called in alprazolam, and left message for pt to call and schedule appt

## 2016-05-14 NOTE — Telephone Encounter (Signed)
L/m for pt to call. Per Audelia Acton pt would benefit from a wellness exam. Please schedule.

## 2016-05-19 ENCOUNTER — Ambulatory Visit (INDEPENDENT_AMBULATORY_CARE_PROVIDER_SITE_OTHER): Payer: Medicare HMO | Admitting: Medical

## 2016-05-19 ENCOUNTER — Encounter: Payer: Self-pay | Admitting: Medical

## 2016-05-19 VITALS — BP 124/82 | HR 72 | Ht 67.5 in | Wt 230.2 lb

## 2016-05-19 DIAGNOSIS — R809 Proteinuria, unspecified: Secondary | ICD-10-CM

## 2016-05-19 DIAGNOSIS — M069 Rheumatoid arthritis, unspecified: Secondary | ICD-10-CM | POA: Diagnosis not present

## 2016-05-19 DIAGNOSIS — R609 Edema, unspecified: Secondary | ICD-10-CM

## 2016-05-19 DIAGNOSIS — Z780 Asymptomatic menopausal state: Secondary | ICD-10-CM | POA: Diagnosis not present

## 2016-05-19 DIAGNOSIS — F329 Major depressive disorder, single episode, unspecified: Secondary | ICD-10-CM

## 2016-05-19 DIAGNOSIS — N219 Calculus of lower urinary tract, unspecified: Secondary | ICD-10-CM | POA: Diagnosis not present

## 2016-05-19 DIAGNOSIS — I10 Essential (primary) hypertension: Secondary | ICD-10-CM

## 2016-05-19 DIAGNOSIS — N289 Disorder of kidney and ureter, unspecified: Secondary | ICD-10-CM

## 2016-05-19 DIAGNOSIS — K589 Irritable bowel syndrome without diarrhea: Secondary | ICD-10-CM

## 2016-05-19 DIAGNOSIS — E2839 Other primary ovarian failure: Secondary | ICD-10-CM | POA: Insufficient documentation

## 2016-05-19 DIAGNOSIS — E118 Type 2 diabetes mellitus with unspecified complications: Secondary | ICD-10-CM

## 2016-05-19 DIAGNOSIS — F32A Depression, unspecified: Secondary | ICD-10-CM

## 2016-05-19 DIAGNOSIS — I872 Venous insufficiency (chronic) (peripheral): Secondary | ICD-10-CM | POA: Diagnosis not present

## 2016-05-19 DIAGNOSIS — K219 Gastro-esophageal reflux disease without esophagitis: Secondary | ICD-10-CM

## 2016-05-19 DIAGNOSIS — Z8669 Personal history of other diseases of the nervous system and sense organs: Secondary | ICD-10-CM

## 2016-05-19 DIAGNOSIS — F418 Other specified anxiety disorders: Secondary | ICD-10-CM

## 2016-05-19 DIAGNOSIS — G4700 Insomnia, unspecified: Secondary | ICD-10-CM

## 2016-05-19 DIAGNOSIS — L989 Disorder of the skin and subcutaneous tissue, unspecified: Secondary | ICD-10-CM | POA: Insufficient documentation

## 2016-05-19 DIAGNOSIS — K5909 Other constipation: Secondary | ICD-10-CM

## 2016-05-19 DIAGNOSIS — Z79899 Other long term (current) drug therapy: Secondary | ICD-10-CM

## 2016-05-19 DIAGNOSIS — M8580 Other specified disorders of bone density and structure, unspecified site: Secondary | ICD-10-CM

## 2016-05-19 DIAGNOSIS — M2550 Pain in unspecified joint: Secondary | ICD-10-CM

## 2016-05-19 DIAGNOSIS — F419 Anxiety disorder, unspecified: Secondary | ICD-10-CM

## 2016-05-19 LAB — CBC
HCT: 38.5 % (ref 35.0–45.0)
Hemoglobin: 12.9 g/dL (ref 11.7–15.5)
MCH: 30.3 pg (ref 27.0–33.0)
MCHC: 33.5 g/dL (ref 32.0–36.0)
MCV: 90.4 fL (ref 80.0–100.0)
MPV: 10.8 fL (ref 7.5–12.5)
Platelets: 287 10*3/uL (ref 140–400)
RBC: 4.26 MIL/uL (ref 3.80–5.10)
RDW: 14 % (ref 11.0–15.0)
WBC: 7 10*3/uL (ref 4.0–10.5)

## 2016-05-19 LAB — COMPREHENSIVE METABOLIC PANEL
ALT: 21 U/L (ref 6–29)
AST: 19 U/L (ref 10–35)
Albumin: 4.3 g/dL (ref 3.6–5.1)
Alkaline Phosphatase: 59 U/L (ref 33–130)
BUN: 17 mg/dL (ref 7–25)
CO2: 20 mmol/L (ref 20–31)
Calcium: 9.3 mg/dL (ref 8.6–10.4)
Chloride: 107 mmol/L (ref 98–110)
Creat: 1.08 mg/dL — ABNORMAL HIGH (ref 0.50–0.99)
Glucose, Bld: 85 mg/dL (ref 65–99)
Potassium: 5 mmol/L (ref 3.5–5.3)
Sodium: 139 mmol/L (ref 135–146)
Total Bilirubin: 0.4 mg/dL (ref 0.2–1.2)
Total Protein: 7.3 g/dL (ref 6.1–8.1)

## 2016-05-19 LAB — HEMOGLOBIN A1C
Hgb A1c MFr Bld: 5.9 % — ABNORMAL HIGH (ref <5.7)
Mean Plasma Glucose: 123 mg/dL

## 2016-05-19 NOTE — Progress Notes (Addendum)
Subjective: Chief Complaint  Patient presents with  . fill out form for insurance    fill out form for insurance, look at her leg for spots    Here for med check, f/u.   Hx/o low bone mass, just got letter from [place] that last bone density was over 2 years ago.  Takes OTC Ca+Vit D.   She notes husband Arnell Sieving had bad fall recently, EMS was called.   Recently he got into her medication, he seemed out of it.  He had taken 6 of her gabapentin to help sleep.  He seemed sedation, so she called EMS for this.  He is not checking sugar daily.  He is driving her crazy.  He tells his PT?  Some of the neighbors are coming to check on him.  He says he only does the PT to shut her up.   All he wants to do is eat and watch TV.  She is having more migraines of late.  She is taking Xanax prn, 1-2 daily to help with sleep and anxiety.  Diabetes - taking Victoza daily, Metformin 500mg  daily,   HTN - Taking Lisinopril 20mg  daily  Has hx/o migraines, cluster headaches - taking verapamil 240mg  daily, Imitrex prn.  Sees Dr. Leonie Man at [facility].  Insurance wont cover Imitrex now, so this will need to be changed  Hyperlipidemia -Taking Pravachol 20mg  daily  chronic back pain - was taking Soma, but insurance wont cover that any more.  Taking gabapentin 300mg  TID for back pain. Sees [facility].  Takes pain medication per ortho, but doesn't use as much due to constipation  Taking Evista 60mg  daily? Taking Depakote ER 500mg  daily?  Not taking GERD medication currently.   Past Medical History:  Diagnosis Date  . Chronic constipation   . Cluster headache syndrome 04/27/13  . Colon polyp 06/11/2011   Most recent 04/23/08, no polyp--mild diverticulosis.  Repeat 10 yrs.  . Depression with anxiety 02/22/2008   Qualifier: Diagnosis of  By: Nolon Rod CMA (AAMA), Robin    . GERD (gastroesophageal reflux disease) 06/11/2011  . Hypertension    Recently diagnosed  . Migraine syndrome   . Overweight(278.02)  06/11/2011  . PUD (peptic ulcer disease) 04/23/08   Multiple antrum ulcers; h pylori neg  . Rheumatoid arthritis(714.0)    Current Outpatient Prescriptions on File Prior to Visit  Medication Sig Dispense Refill  . ALPRAZolam (XANAX) 0.5 MG tablet Take 1 tablet (0.5 mg total) by mouth at bedtime. 50 tablet 0  . divalproex (DEPAKOTE ER) 500 MG 24 hr tablet Take 1 tablet (500 mg total) by mouth at bedtime. 30 tablet 6  . gabapentin (NEURONTIN) 300 MG capsule TAKE (1) CAPSULE THREE TIMES DAILY. 270 capsule 1  . HYDROcodone-acetaminophen (NORCO) 7.5-325 MG per tablet Take 1 tablet by mouth as needed. Reported on 09/05/2015    . injection device for insulin (B-D PEN) DEVI 1 each by Other route once. 100 each 5  . Insulin Pen Needle (PEN NEEDLES) 31G X 8 MM MISC 1 each by Does not apply route daily. 100 each 11  . Liraglutide (VICTOZA) 18 MG/3ML SOPN Inject 0.3 mLs (1.8 mg total) into the skin at bedtime. 9 mL 3  . lisinopril (PRINIVIL,ZESTRIL) 20 MG tablet Take 1 tablet (20 mg total) by mouth daily. 90 tablet 3  . pravastatin (PRAVACHOL) 20 MG tablet Take 1 tablet (20 mg total) by mouth daily. 90 tablet 3  . SUMAtriptan (IMITREX) 100 MG tablet Take 1 tablet (100  mg total) by mouth every 2 (two) hours as needed for migraine. May repeat in 2 hours if headache persists or recurs. 9 tablet 4  . verapamil (CALAN-SR) 240 MG CR tablet Take 1 tablet (240 mg total) by mouth at bedtime. 90 tablet 3  . metFORMIN (GLUCOPHAGE) 500 MG tablet Take 1 tablet (500 mg total) by mouth daily with breakfast. 90 tablet 1  . raloxifene (EVISTA) 60 MG tablet Take 1 tablet (60 mg total) by mouth daily. 30 tablet 11   No current facility-administered medications on file prior to visit.    ROS as in subjective   Objective: BP 124/82   Pulse 72   Ht 5' 7.5" (1.715 m)   Wt 230 lb 3.2 oz (104.4 kg)   SpO2 96%   BMI 35.52 kg/m    General appearance: alert, no distress, WD/WN Neck: supple, no lymphadenopathy, no  thyromegaly, no masses Heart: RRR, normal S1, S2, no murmurs Lungs: CTA bilaterally, no wheezes, rhonchi, or rales Abdomen: +bs, soft, non tender, non distended, no masses, no hepatomegaly, no splenomegaly Extremities: no edema, no cyanosis, no clubbing Pulses: 2+ symmetric, upper and lower extremities, normal cap refill Psychiatric: normal affect, behavior normal, pleasant  Skin: left lower anterior leg with 3 skin lesions superior one is brown roughly 65mm x 71mm well defined probably AK, the one inferior to this is scaly relatively flat, brown/red color, 1cm x 1cm roughly, and another lesion inferior to this, 70mm roundish and somewhat reflective roundish border, but otherwise flat purple/brown   Diabetic Foot Exam - Simple   Simple Foot Form Visual Inspection See comments:  Yes Sensation Testing Intact to touch and monofilament testing bilaterally:  Yes Pulse Check Posterior Tibialis and Dorsalis pulse intact bilaterally:  Yes Comments Purplish blanchable spots throughout medial feet suggestive of spider veins, no other worrisome skin lesions     Assessment: Encounter Diagnoses  Name Primary?  . Renal insufficiency Yes  . Microalbuminuria   . Essential hypertension   . Chronic venous insufficiency   . Chronic constipation   . Irritable bowel syndrome, unspecified type   . Gastroesophageal reflux disease without esophagitis   . Diabetes mellitus with complication ([place])   . Low bone mass   . Rheumatoid arthritis involving multiple joints ([place])   . Anxiety and depression   . Edema, unspecified type   . High risk medication use   . History of migraine headaches   . Insomnia, unspecified type   . Polyarthralgia   . Post-menopausal   . Estrogen deficiency   . Skin lesion     Plan: Diabetes - C/t current medications, routine labs today.    C/t glucose monitoring daily foot checks, yearly eye exam, diabetic diet.  HTN - c/t same medication  Renal insufficiency - labs  today  RA - f/u with rheumatology  chronic back pain - sees [facility]  Insomnia, anxiety - uses xanax prn, discussed use of medication, avoid over use, other ways to cope with anxiety  Low bone mass, post menopausal, estrogen deficiency - referral back for bone density, c/t VitD, weight bearing and aerobic exercise  Skin lesion - referral back to dermatology  [place] Patient Assessment Form also completed today.  Kathlen was seen today for fill out form for insurance.  Diagnoses and all orders for this visit:  Renal insufficiency -     Comprehensive metabolic panel -     CBC -     VITAMIN D 25 Hydroxy (Vit-D Deficiency,  Fractures) -     DG Bone Density; Future  Microalbuminuria  Essential hypertension -     Hemoglobin A1c  Chronic venous insufficiency  Chronic constipation  Irritable bowel syndrome, unspecified type  Gastroesophageal reflux disease without esophagitis -     DG Bone Density; Future  Diabetes mellitus with complication (HCC) -     Hemoglobin A1c  Low bone mass -     VITAMIN D 25 Hydroxy (Vit-D Deficiency, Fractures) -     DG Bone Density; Future  Rheumatoid arthritis involving multiple joints (HCC)  Anxiety and depression  Edema, unspecified type  High risk medication use -     DG Bone Density; Future  History of migraine headaches  Insomnia, unspecified type  Polyarthralgia  Post-menopausal -     DG Bone Density; Future  Estrogen deficiency -     DG Bone Density; Future  Skin lesion -     Ambulatory referral to Dermatology

## 2016-05-20 ENCOUNTER — Other Ambulatory Visit: Payer: Self-pay | Admitting: Medical

## 2016-05-20 LAB — VITAMIN D 25 HYDROXY (VIT D DEFICIENCY, FRACTURES): Vit D, 25-Hydroxy: 19 ng/mL — ABNORMAL LOW (ref 30–100)

## 2016-05-20 MED ORDER — LISINOPRIL 20 MG PO TABS: 20.0000 mg | ORAL_TABLET | Freq: Every day | ORAL | 3 refills | Status: DC

## 2016-05-20 MED ORDER — INJECTION DEVICE FOR INSULIN DEVI: 1.0000 | Freq: Once | 5 refills | Status: AC

## 2016-05-20 MED ORDER — PEN NEEDLES 31G X 8 MM MISC: 1.0000 | Freq: Every day | 11 refills | Status: DC

## 2016-05-20 MED ORDER — GABAPENTIN 300 MG PO CAPS: ORAL_CAPSULE | ORAL | 1 refills | Status: DC

## 2016-05-20 MED ORDER — VITAMIN D (ERGOCALCIFEROL) 1.25 MG (50000 UNIT) PO CAPS: 50000.0000 [IU] | ORAL_CAPSULE | ORAL | 3 refills | Status: DC

## 2016-05-20 MED ORDER — LIRAGLUTIDE 18 MG/3ML ~~LOC~~ SOPN: 1.8000 mg | PEN_INJECTOR | Freq: Every day | SUBCUTANEOUS | 3 refills | Status: DC

## 2016-05-20 MED ORDER — METFORMIN HCL 500 MG PO TABS: 500.0000 mg | ORAL_TABLET | Freq: Every day | ORAL | 1 refills | Status: DC

## 2016-05-24 ENCOUNTER — Telehealth: Payer: Self-pay

## 2016-05-24 MED ORDER — LIRAGLUTIDE 18 MG/3ML ~~LOC~~ SOPN: 1.8000 mg | PEN_INJECTOR | Freq: Every day | SUBCUTANEOUS | 3 refills | Status: DC

## 2016-05-24 MED ORDER — PEN NEEDLES 31G X 8 MM MISC: 1.0000 | Freq: Every day | 11 refills | Status: DC

## 2016-05-25 NOTE — Telephone Encounter (Signed)
meds were refilled

## 2016-07-01 ENCOUNTER — Telehealth: Payer: Self-pay

## 2016-07-01 NOTE — Telephone Encounter (Signed)
Please call out refill with 2 refills.   Please order electronically as well

## 2016-07-01 NOTE — Telephone Encounter (Signed)
PT needs refill of alprazolam called to [facility] [place]. Emma [name]

## 2016-07-02 MED ORDER — ALPRAZOLAM 0.5 MG PO TABS: 0.5000 mg | ORAL_TABLET | Freq: Every day | ORAL | 2 refills | Status: DC

## 2016-07-02 NOTE — Telephone Encounter (Signed)
Called in. /RLB  

## 2016-07-08 ENCOUNTER — Telehealth: Payer: Self-pay | Admitting: Neurology

## 2016-07-08 NOTE — Telephone Encounter (Signed)
[facility]a [facility]. Rn stated Dr. Leonie Man does not prescribed narcotics. Sharyn Lull at [facility] stated the medication was last refill by Dr Gwenlyn Perking MD. Rn stated pt call phone staff to state that [facility]. Rn stated the rx refill should go to Dr .Gwenlyn Perking MD who is prescribing the patients medications. Sharyn Lull will let the patient know that medication was not prescribed by our office. Sharyn Lull at [place] [facility] will let patient know that.

## 2016-07-08 NOTE — Telephone Encounter (Signed)
Left vm for patient that Emma [name] is prescribed by her other md. Rn explain that per her pharmacy ii was prescribed by Dr. Gwenlyn Perking MD. Rn left vm to advised pt to call office the prescribes it.

## 2016-07-08 NOTE — Telephone Encounter (Signed)
[facility] times re: pt refill of Carisoprodol(Soma)350 MG Pt said that she is down to 4 and she will be out.  [id number] - [place], [place] - [address] [phone] (Phone) [phone] (Fax)

## 2016-07-22 DIAGNOSIS — I8312 Varicose veins of left lower extremity with inflammation: Secondary | ICD-10-CM | POA: Diagnosis not present

## 2016-07-22 DIAGNOSIS — I872 Venous insufficiency (chronic) (peripheral): Secondary | ICD-10-CM | POA: Diagnosis not present

## 2016-07-22 DIAGNOSIS — L819 Disorder of pigmentation, unspecified: Secondary | ICD-10-CM | POA: Diagnosis not present

## 2016-07-22 DIAGNOSIS — L438 Other lichen planus: Secondary | ICD-10-CM | POA: Diagnosis not present

## 2016-07-22 DIAGNOSIS — I8311 Varicose veins of right lower extremity with inflammation: Secondary | ICD-10-CM | POA: Diagnosis not present

## 2016-07-22 DIAGNOSIS — C44712 Basal cell carcinoma of skin of right lower limb, including hip: Secondary | ICD-10-CM | POA: Diagnosis not present

## 2016-07-22 DIAGNOSIS — D485 Neoplasm of uncertain behavior of skin: Secondary | ICD-10-CM | POA: Diagnosis not present

## 2016-07-23 ENCOUNTER — Telehealth: Payer: Self-pay | Admitting: Medical

## 2016-07-23 NOTE — Telephone Encounter (Signed)
Pt said she is allergic to flea venom and she was bit by fleas last night and again this morning. Pt took Benadryl but arm is swollen from elbow to shoulder & that arm feels tight like blood pressure cuff is on it. Pt wants to know what else he can take or if [facility] be [facility] [address] to help her.

## 2016-07-23 NOTE — Telephone Encounter (Signed)
She can use benadryl q4 hours if needed, if not allergic to ibuprofen can do ibuprofen 2 or 3 tablets TID the next few days, arm elevation.   If worse, come in for eval

## 2016-07-23 NOTE — Telephone Encounter (Signed)
Called and inform pt of this.

## 2016-07-29 ENCOUNTER — Ambulatory Visit: Payer: Medicare HMO | Admitting: Nurse Practitioner

## 2016-07-30 ENCOUNTER — Encounter: Payer: Self-pay | Admitting: Nurse Practitioner

## 2016-08-04 DIAGNOSIS — M5137 Other intervertebral disc degeneration, lumbosacral region: Secondary | ICD-10-CM | POA: Diagnosis not present

## 2016-08-04 DIAGNOSIS — M5416 Radiculopathy, lumbar region: Secondary | ICD-10-CM | POA: Diagnosis not present

## 2016-08-20 DIAGNOSIS — C44712 Basal cell carcinoma of skin of right lower limb, including hip: Secondary | ICD-10-CM | POA: Diagnosis not present

## 2016-10-19 ENCOUNTER — Other Ambulatory Visit: Payer: Self-pay | Admitting: Medical

## 2016-10-21 ENCOUNTER — Other Ambulatory Visit: Payer: Self-pay | Admitting: Medical

## 2016-10-21 NOTE — Telephone Encounter (Signed)
Is this okay to refill she was here recently and had labs but no lipids

## 2016-10-22 ENCOUNTER — Telehealth: Payer: Self-pay | Admitting: Medical

## 2016-10-22 ENCOUNTER — Other Ambulatory Visit: Payer: Self-pay | Admitting: Medical

## 2016-10-22 NOTE — Telephone Encounter (Signed)
Call in [facility] to [facility] in [place].

## 2016-10-22 NOTE — Telephone Encounter (Signed)
Called in rx to her pharmacy

## 2016-10-22 NOTE — Telephone Encounter (Signed)
Pt called about Pravastatin 20mg  refill. Pharmacy advised her that they still have not received refill authorization. Escrip transcription failed per system.

## 2016-11-02 DIAGNOSIS — M5416 Radiculopathy, lumbar region: Secondary | ICD-10-CM | POA: Diagnosis not present

## 2016-11-02 DIAGNOSIS — M545 Low back pain: Secondary | ICD-10-CM | POA: Diagnosis not present

## 2016-11-02 DIAGNOSIS — M5137 Other intervertebral disc degeneration, lumbosacral region: Secondary | ICD-10-CM | POA: Diagnosis not present

## 2016-11-04 ENCOUNTER — Telehealth: Payer: Self-pay | Admitting: Nurse Practitioner

## 2016-11-04 NOTE — Telephone Encounter (Signed)
Patient called office in reference to having a headache going on a week and would like to know if she can be seen for an infusion.  Please call

## 2016-11-04 NOTE — Telephone Encounter (Addendum)
I spoke to pt and she has had migraines for the last 2 wks on and off.  This last week she states its been daily.  She has taken sumatriptan which helps some but has not gotten rid of her migraine. Level 8.  Pain is located L eye upward with borderline nausea.  Light and noise bother her.  She is asking for an infusion.  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  She noted she has infusions in the past, I do not see this in [facility], could be earlier.  Otila Kluver is available at this time.

## 2016-11-04 NOTE — Telephone Encounter (Signed)
Tina with intrafusion gave pt depacon 1000mg  IV starting level 10 to level 4, gave her another 1000mg  IV.  Hopefully will continue to come on down.  (relayed to me by tina, RN intrafusion).

## 2016-11-04 NOTE — Telephone Encounter (Addendum)
Per Dr. Brett Fairy ok for Depacon 1000mg  IV and may repeat x 1.  Emma [name] in intrafusion ok for today if pt here by 1600.  I spoke to pt and she will come today and made aware to be here before 4p.

## 2016-11-20 DIAGNOSIS — M8580 Other specified disorders of bone density and structure, unspecified site: Secondary | ICD-10-CM

## 2016-11-20 HISTORY — DX: Other specified disorders of bone density and structure, unspecified site: M85.80

## 2016-11-24 DIAGNOSIS — M8589 Other specified disorders of bone density and structure, multiple sites: Secondary | ICD-10-CM | POA: Diagnosis not present

## 2016-11-24 DIAGNOSIS — Z1231 Encounter for screening mammogram for malignant neoplasm of breast: Secondary | ICD-10-CM | POA: Diagnosis not present

## 2016-11-24 DIAGNOSIS — Z803 Family history of malignant neoplasm of breast: Secondary | ICD-10-CM | POA: Diagnosis not present

## 2016-11-24 LAB — HM DEXA SCAN

## 2016-11-24 LAB — HM MAMMOGRAPHY

## 2016-12-02 ENCOUNTER — Telehealth: Payer: Self-pay | Admitting: Medical

## 2016-12-02 NOTE — Telephone Encounter (Signed)
Called and left message with pt to schedule appt

## 2016-12-02 NOTE — Telephone Encounter (Signed)
Get her in for med check and review of bone density test results

## 2016-12-02 NOTE — Telephone Encounter (Signed)
Error, duplicate entry.

## 2016-12-03 ENCOUNTER — Encounter: Payer: Self-pay | Admitting: Medical

## 2016-12-06 ENCOUNTER — Telehealth: Payer: Self-pay | Admitting: Internal Medicine

## 2016-12-06 NOTE — Telephone Encounter (Signed)
Pt called back and states she can not come in right now as she has too much going on. She wants to know what the results of her bone density are and is it severe that needs to come in now.

## 2016-12-07 NOTE — Telephone Encounter (Signed)
Pt left message and I called her back and left message as to [facility] instructions

## 2016-12-07 NOTE — Telephone Encounter (Signed)
Not urgent.  She needs to be taking Vitamin D, calcium, getting exercise.  Have her schedule a time to come in when she has more availability to discuss.

## 2016-12-16 ENCOUNTER — Encounter: Payer: Self-pay | Admitting: Medical

## 2016-12-16 ENCOUNTER — Ambulatory Visit (INDEPENDENT_AMBULATORY_CARE_PROVIDER_SITE_OTHER): Payer: Medicare HMO | Admitting: Medical

## 2016-12-16 VITALS — BP 136/82 | HR 88 | Wt 244.0 lb

## 2016-12-16 DIAGNOSIS — M2550 Pain in unspecified joint: Secondary | ICD-10-CM | POA: Diagnosis not present

## 2016-12-16 DIAGNOSIS — M8580 Other specified disorders of bone density and structure, unspecified site: Secondary | ICD-10-CM | POA: Diagnosis not present

## 2016-12-16 DIAGNOSIS — Z23 Encounter for immunization: Secondary | ICD-10-CM | POA: Diagnosis not present

## 2016-12-16 DIAGNOSIS — E2839 Other primary ovarian failure: Secondary | ICD-10-CM

## 2016-12-16 DIAGNOSIS — R609 Edema, unspecified: Secondary | ICD-10-CM | POA: Diagnosis not present

## 2016-12-16 DIAGNOSIS — E669 Obesity, unspecified: Secondary | ICD-10-CM

## 2016-12-16 DIAGNOSIS — Z7185 Encounter for immunization safety counseling: Secondary | ICD-10-CM

## 2016-12-16 DIAGNOSIS — Z79899 Other long term (current) drug therapy: Secondary | ICD-10-CM

## 2016-12-16 DIAGNOSIS — N289 Disorder of kidney and ureter, unspecified: Secondary | ICD-10-CM | POA: Diagnosis not present

## 2016-12-16 DIAGNOSIS — E559 Vitamin D deficiency, unspecified: Secondary | ICD-10-CM

## 2016-12-16 DIAGNOSIS — R809 Proteinuria, unspecified: Secondary | ICD-10-CM

## 2016-12-16 DIAGNOSIS — E118 Type 2 diabetes mellitus with unspecified complications: Secondary | ICD-10-CM

## 2016-12-16 DIAGNOSIS — M069 Rheumatoid arthritis, unspecified: Secondary | ICD-10-CM | POA: Diagnosis not present

## 2016-12-16 DIAGNOSIS — I10 Essential (primary) hypertension: Secondary | ICD-10-CM | POA: Diagnosis not present

## 2016-12-16 DIAGNOSIS — Z7189 Other specified counseling: Secondary | ICD-10-CM

## 2016-12-16 DIAGNOSIS — I872 Venous insufficiency (chronic) (peripheral): Secondary | ICD-10-CM

## 2016-12-16 MED ORDER — ALPRAZOLAM 0.5 MG PO TABS: 0.5000 mg | ORAL_TABLET | Freq: Every day | ORAL | 2 refills | Status: DC

## 2016-12-16 NOTE — Progress Notes (Signed)
Subjective: Chief Complaint  Patient presents with  . discuss results dexa    discuss results dexa    Here for f/u.  Accompanied by husband.  Not checking sugars. Is busy at home.   Takes care of husband, takes care of the dogs, does the yard, is walking a lot.  Also been taking care of her father some, who just got out of rehab.  Has been busy taking care of every body else.   Sees Dr. Lovenia Shuck for pain  compliant with all medications.    No polydipsia, polyuria, blurred vision, chest pain, SOB.  Does get some swelling at times.  No other aggravating or relieving factors. No other complaint.  Past Medical History:  Diagnosis Date  . Chronic constipation   . Cluster headache syndrome 04/27/13  . Colon polyp 06/11/2011   Most recent 04/23/08, no polyp--mild diverticulosis.  Repeat 10 yrs.  . Depression with anxiety 02/22/2008   Qualifier: Diagnosis of  By: Nolon Rod CMA (AAMA), Robin    . GERD (gastroesophageal reflux disease) 06/11/2011  . Hypertension    Recently diagnosed  . Migraine syndrome   . Osteopenia 11/2016   bone density. plan repeat in 11/2018  . Overweight(278.02) 06/11/2011  . PUD (peptic ulcer disease) 04/23/08   Multiple antrum ulcers; h pylori neg  . Rheumatoid arthritis(714.0)    Current Outpatient Prescriptions on File Prior to Visit  Medication Sig Dispense Refill  . gabapentin (NEURONTIN) 300 MG capsule TAKE (1) CAPSULE THREE TIMES DAILY. 270 capsule 1  . HYDROcodone-acetaminophen (NORCO) 7.5-325 MG per tablet Take 1 tablet by mouth as needed. Reported on 09/05/2015    . Insulin Pen Needle (PEN NEEDLES) 31G X 8 MM MISC 1 each by Does not apply route daily. 100 each 11  . liraglutide (VICTOZA) 18 MG/3ML SOPN Inject 0.3 mLs (1.8 mg total) into the skin at bedtime. 9 mL 3  . lisinopril (PRINIVIL,ZESTRIL) 20 MG tablet Take 1 tablet (20 mg total) by mouth daily. 90 tablet 3  . metFORMIN (GLUCOPHAGE) 500 MG tablet Take 1 tablet (500 mg total) by mouth daily with breakfast.  90 tablet 1  . pravastatin (PRAVACHOL) 20 MG tablet TAKE 1 TABLET BY MOUTH DAILY 90 tablet 0  . raloxifene (EVISTA) 60 MG tablet Take 1 tablet (60 mg total) by mouth daily. 30 tablet 11  . SUMAtriptan (IMITREX) 100 MG tablet Take 1 tablet (100 mg total) by mouth every 2 (two) hours as needed for migraine. May repeat in 2 hours if headache persists or recurs. 9 tablet 4  . verapamil (CALAN-SR) 240 MG CR tablet Take 1 tablet (240 mg total) by mouth at bedtime. 90 tablet 3  . Vitamin D, Ergocalciferol, (DRISDOL) 50000 units CAPS capsule Take 1 capsule (50,000 Units total) by mouth every 7 (seven) days. 12 capsule 3   No current facility-administered medications on file prior to visit.    ROS as in subjective   Objective: BP 136/82   Pulse 88   Wt 244 lb (110.7 kg)   SpO2 97%   BMI 37.65 kg/m   General appearance: alert, no distress, WD/WN,  Neck: supple, no lymphadenopathy, no thyromegaly, no masses Heart: RRR, normal S1, S2, no murmurs Lungs: CTA bilaterally, no wheezes, rhonchi, or rales Pulses: 1+ symmetric, upper and lower extremities, normal cap refill Ext: 1+ nonpitting edema bilat lower legs and ankles     Assessment: Encounter Diagnoses  Name Primary?  . Osteopenia, unspecified location Yes  . Vaccine counseling   . Estrogen  deficiency   . High risk medication use   . Renal insufficiency   . Diabetes mellitus with complication ([place])   . Essential hypertension   . Chronic venous insufficiency   . Rheumatoid arthritis involving multiple joints ([place])   . Edema, unspecified type   . Obesity, unspecified classification, unspecified obesity type, unspecified whether serious comorbidity present   . Microalbuminuria   . Vitamin D deficiency   . Polyarthralgia   . Need for influenza vaccination   . Need for pneumococcal vaccination      Plan: C/t current medications, counseled on diet, exercise, f/u.    She wants Ca+D sent to pharmacy as a prescription  Advised  leg elevation in evenings  She requests rheumatoid labs despite prior negative screening  Discussed osteopenia, need for aerobic and weight bearing exercise, discussed Vit D supplement.   Counseled on the influenza virus vaccine.  Vaccine information sheet given.  Influenza vaccine given after consent obtained.  She will check insurance coverage for Tdap vaccine and Shingrix vaccine.  Tymeshia was seen today for discuss results dexa.  Diagnoses and all orders for this visit:  Osteopenia, unspecified location  Vaccine counseling  Estrogen deficiency  High risk medication use -     Renal Function Panel -     Hepatic function panel  Renal insufficiency -     Renal Function Panel  Diabetes mellitus with complication (HCC) -     Hemoglobin A1c -     Lipid panel  Essential hypertension -     Lipid panel  Chronic venous insufficiency  Rheumatoid arthritis involving multiple joints (HCC)  Edema, unspecified type  Obesity, unspecified classification, unspecified obesity type, unspecified whether serious comorbidity present  Microalbuminuria -     Renal Function Panel -     Hepatic function panel -     Hemoglobin A1c -     Lipid panel  Vitamin D deficiency -     VITAMIN D 25 Hydroxy (Vit-D Deficiency, Fractures)  Polyarthralgia -     Sedimentation rate -     Cyclic citrul peptide antibody, IgG  Need for influenza vaccination -     Flu Vaccine QUAD 36+ mos IM  Need for pneumococcal vaccination -     Pneumococcal polysaccharide vaccine 23-valent greater than or equal to 2yo subcutaneous/IM  Other orders -     ALPRAZolam (XANAX) 0.5 MG tablet; Take 1 tablet (0.5 mg total) by mouth at bedtime.

## 2016-12-16 NOTE — Patient Instructions (Signed)
Recommendation  Call insurance about Tdap vaccine   I recommend you have a shingles vaccine to help prevent shingles or herpes zoster outbreak.   Please call your insurer to inquire about coverage for the Shingrix vaccine given in 2 doses.   Some insurers cover this vaccine after age 63, some cover this after age 64.  If your insurer covers this, then call to schedule appointment to have this vaccine here.

## 2016-12-17 ENCOUNTER — Other Ambulatory Visit: Payer: Self-pay | Admitting: Medical

## 2016-12-17 LAB — LIPID PANEL
Cholesterol: 164 mg/dL (ref <200)
HDL: 41 mg/dL — ABNORMAL LOW (ref >50)
LDL Cholesterol (Calc): 94 mg/dL (calc)
Non-HDL Cholesterol (Calc): 123 mg/dL (calc) (ref <130)
Total CHOL/HDL Ratio: 4 (calc) (ref <5.0)
Triglycerides: 195 mg/dL — ABNORMAL HIGH (ref <150)

## 2016-12-17 LAB — HEPATIC FUNCTION PANEL
AG Ratio: 1.5 (calc) (ref 1.0–2.5)
ALT: 18 U/L (ref 6–29)
AST: 14 U/L (ref 10–35)
Albumin: 4 g/dL (ref 3.6–5.1)
Alkaline phosphatase (APISO): 59 U/L (ref 33–130)
Bilirubin, Direct: 0.1 mg/dL (ref 0.0–0.2)
Globulin: 2.6 g/dL (calc) (ref 1.9–3.7)
Indirect Bilirubin: 0.1 mg/dL (calc) — ABNORMAL LOW (ref 0.2–1.2)
Total Bilirubin: 0.2 mg/dL (ref 0.2–1.2)
Total Protein: 6.6 g/dL (ref 6.1–8.1)

## 2016-12-17 LAB — RENAL FUNCTION PANEL
Albumin: 4 g/dL (ref 3.6–5.1)
BUN: 13 mg/dL (ref 7–25)
CO2: 25 mmol/L (ref 20–32)
Calcium: 8.7 mg/dL (ref 8.6–10.4)
Chloride: 108 mmol/L (ref 98–110)
Creat: 0.95 mg/dL (ref 0.50–0.99)
Glucose, Bld: 86 mg/dL (ref 65–99)
Phosphorus: 3.7 mg/dL (ref 2.5–4.5)
Potassium: 4.5 mmol/L (ref 3.5–5.3)
Sodium: 142 mmol/L (ref 135–146)

## 2016-12-17 LAB — CYCLIC CITRUL PEPTIDE ANTIBODY, IGG: Cyclic Citrullin Peptide Ab: <16 UNITS

## 2016-12-17 LAB — HEMOGLOBIN A1C
Hgb A1c MFr Bld: 6 % of total Hgb — ABNORMAL HIGH (ref <5.7)
Mean Plasma Glucose: 126 (calc)
eAG (mmol/L): 7 (calc)

## 2016-12-17 LAB — SEDIMENTATION RATE: Sed Rate: 36 mm/h — ABNORMAL HIGH (ref 0–30)

## 2016-12-17 LAB — VITAMIN D 25 HYDROXY (VIT D DEFICIENCY, FRACTURES): Vit D, 25-Hydroxy: 32 ng/mL (ref 30–100)

## 2016-12-17 MED ORDER — CALCIUM CARBONATE 600 MG PO TABS: 600.0000 mg | ORAL_TABLET | Freq: Two times a day (BID) | ORAL | 3 refills | Status: DC

## 2016-12-17 MED ORDER — METFORMIN HCL 500 MG PO TABS: 500.0000 mg | ORAL_TABLET | Freq: Every day | ORAL | 1 refills | Status: DC

## 2016-12-17 MED ORDER — ROSUVASTATIN CALCIUM 20 MG PO TABS: 20.0000 mg | ORAL_TABLET | Freq: Every day | ORAL | 1 refills | Status: DC

## 2016-12-17 MED ORDER — VERAPAMIL HCL ER 240 MG PO TBCR: 240.0000 mg | EXTENDED_RELEASE_TABLET | Freq: Every day | ORAL | 3 refills | Status: DC

## 2016-12-17 MED ORDER — VITAMIN D3 250 MCG (10000 UT) PO TABS: 1.0000 | ORAL_TABLET | Freq: Every day | ORAL | 3 refills | Status: DC

## 2016-12-17 MED ORDER — GABAPENTIN 300 MG PO CAPS: ORAL_CAPSULE | ORAL | 1 refills | Status: DC

## 2016-12-17 MED ORDER — LIRAGLUTIDE 18 MG/3ML ~~LOC~~ SOPN: 1.8000 mg | PEN_INJECTOR | Freq: Every day | SUBCUTANEOUS | 5 refills | Status: DC

## 2017-01-09 ENCOUNTER — Other Ambulatory Visit: Payer: Self-pay | Admitting: Medical

## 2017-02-05 IMAGING — US US SOFT TISSUE HEAD/NECK
1 series · 14 of 20 positions shown · non-contrast
Comparison: None.

CLINICAL DATA: Palpable left-sided neck mass.

EXAM:
ULTRASOUND OF HEAD/NECK SOFT TISSUES
TECHNIQUE: Ultrasound examination of the head and neck soft tissues was
performed in the area of clinical concern.

[Series 1: us soft tissue head/neck · 0.07mm/px · 14 of 20 slices shown]
[im 1/20]
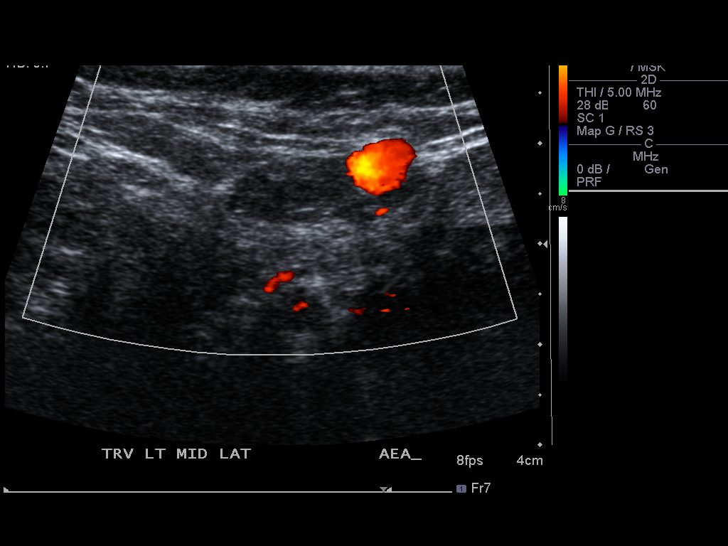
[im 3/20]
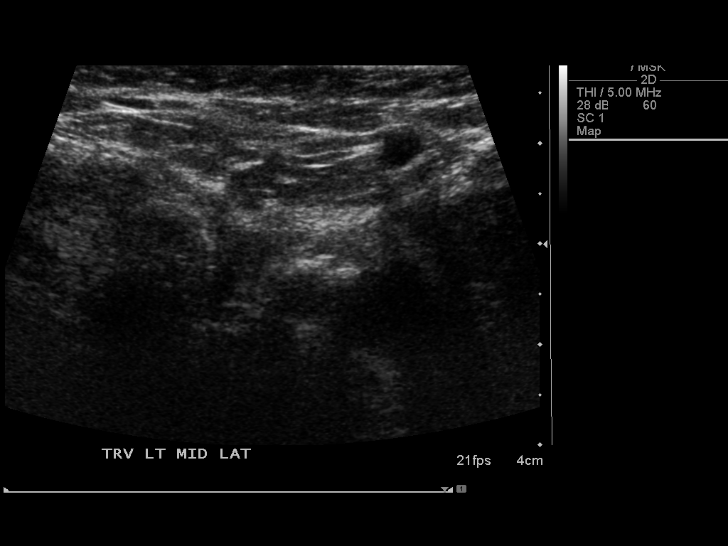
[im 4/20]
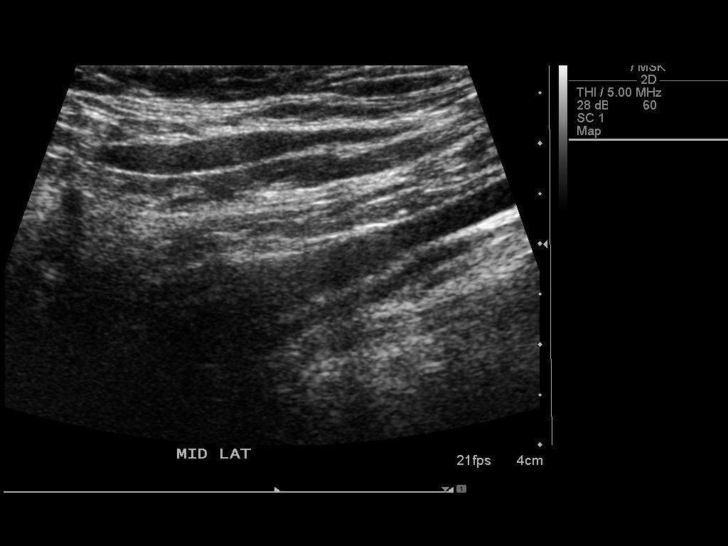
[im 6/20]
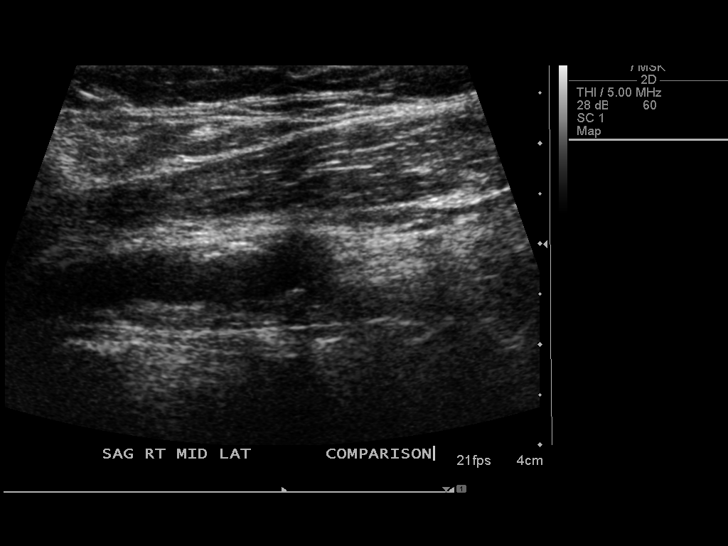
[im 7/20]
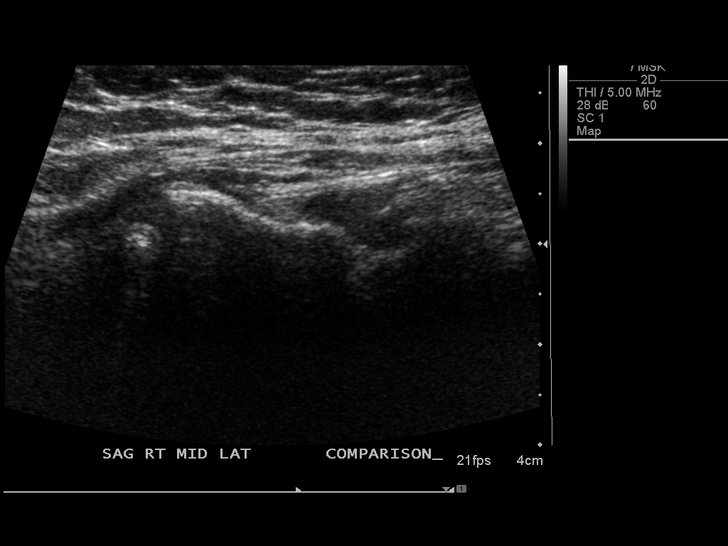
[im 8/20]
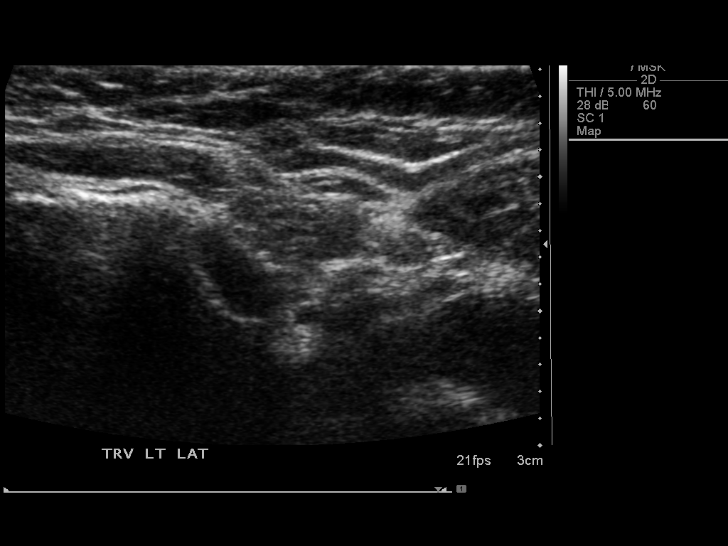
[im 10/20]
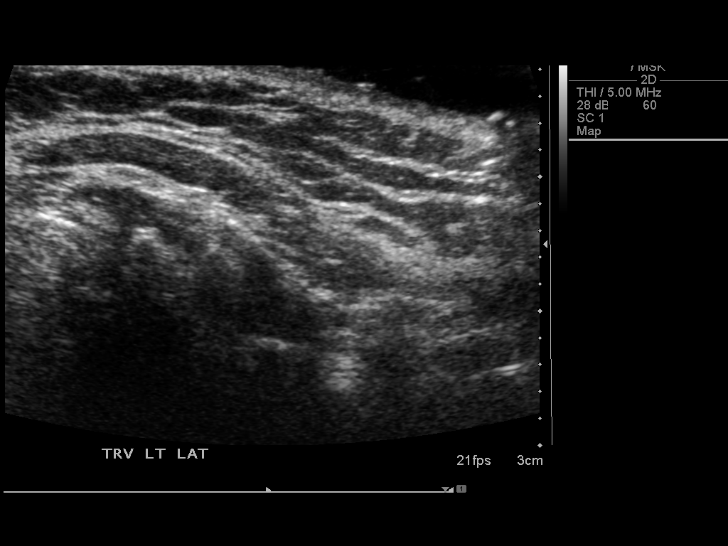
[im 11/20]
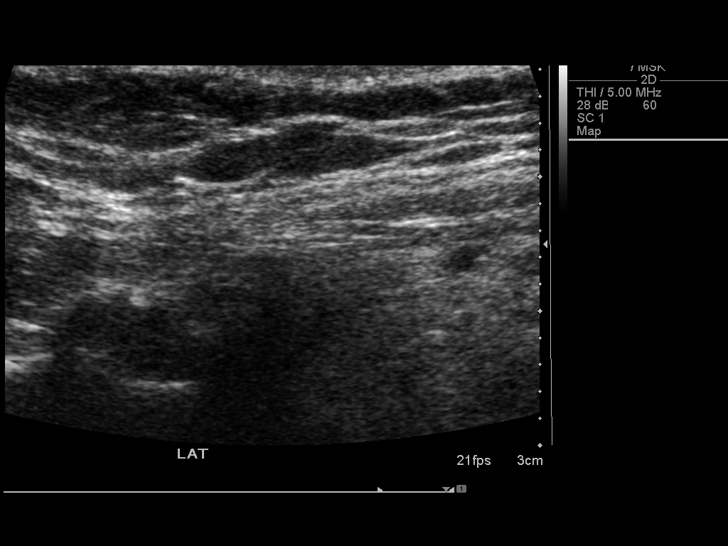
[im 13/20]
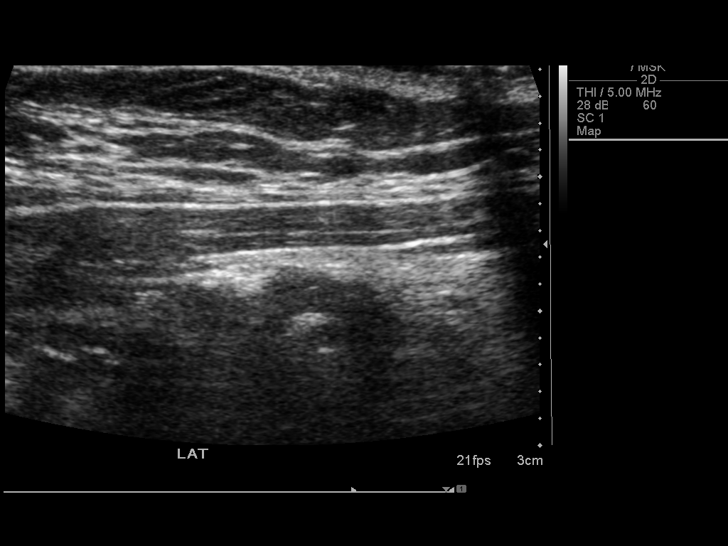
[im 14/20]
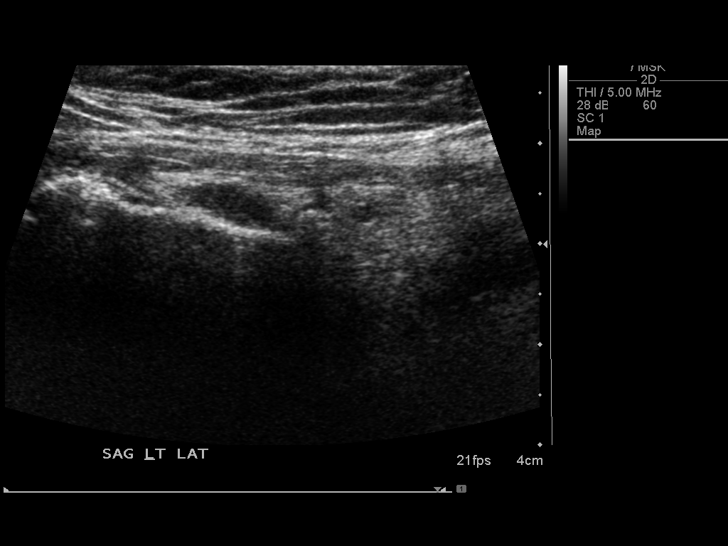
[im 16/20]
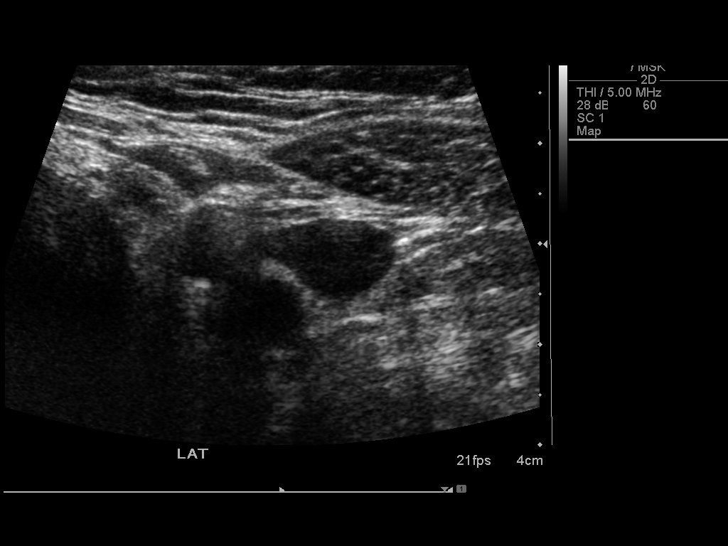
[im 17/20]
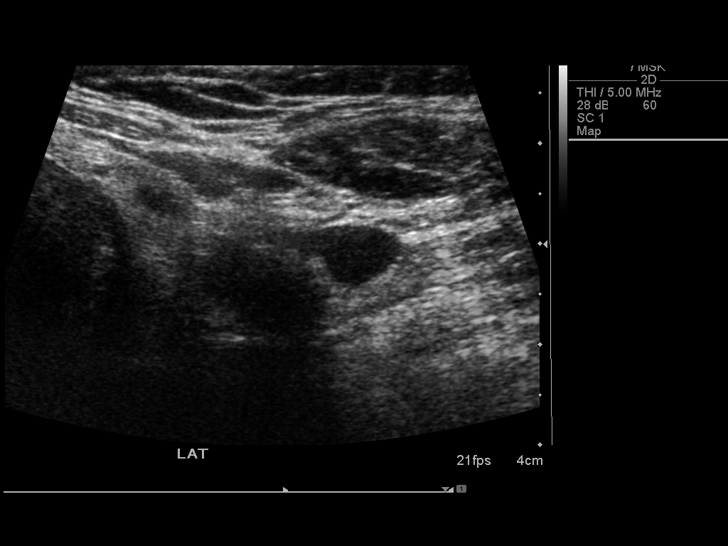
[im 18/20]
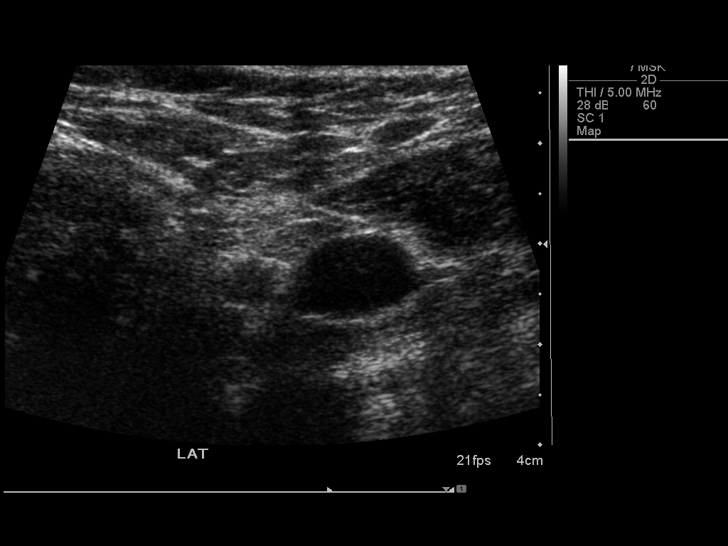
[im 20/20]
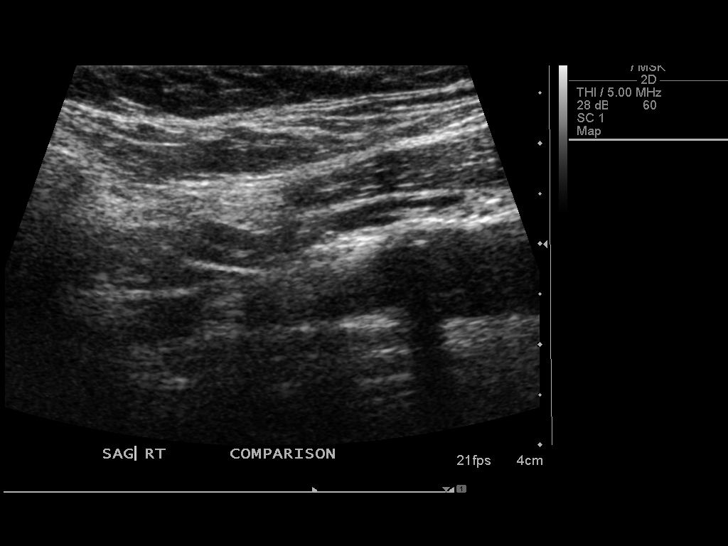

[14 of 20 positions shown; findings below may reference images not displayed]

FINDINGS: In the region of palpable soft tissue abnormality directed by the
patient, ultrasound shows no focal mass or fluid collection. No
lymphadenopathy is identified.
IMPRESSION: No abnormal soft tissue identified in the left neck.

## 2017-03-10 ENCOUNTER — Ambulatory Visit: Payer: Self-pay | Admitting: Medical

## 2017-03-18 ENCOUNTER — Ambulatory Visit: Payer: Self-pay | Admitting: Medical

## 2017-04-04 ENCOUNTER — Ambulatory Visit: Payer: Medicare HMO | Admitting: Neurology

## 2017-04-11 ENCOUNTER — Encounter: Payer: Self-pay | Admitting: Gastroenterology

## 2017-04-20 ENCOUNTER — Encounter: Payer: Self-pay | Admitting: Gastroenterology

## 2017-04-20 ENCOUNTER — Encounter (INDEPENDENT_AMBULATORY_CARE_PROVIDER_SITE_OTHER): Payer: Self-pay

## 2017-04-20 ENCOUNTER — Ambulatory Visit: Payer: Medicare HMO | Admitting: Gastroenterology

## 2017-04-20 VITALS — BP 120/80 | HR 78 | Ht 68.0 in | Wt 239.0 lb

## 2017-04-20 DIAGNOSIS — Z8711 Personal history of peptic ulcer disease: Secondary | ICD-10-CM | POA: Diagnosis not present

## 2017-04-20 DIAGNOSIS — R1013 Epigastric pain: Secondary | ICD-10-CM | POA: Diagnosis not present

## 2017-04-20 DIAGNOSIS — K625 Hemorrhage of anus and rectum: Secondary | ICD-10-CM

## 2017-04-20 DIAGNOSIS — K5909 Other constipation: Secondary | ICD-10-CM | POA: Diagnosis not present

## 2017-04-20 MED ORDER — PLECANATIDE 3 MG PO TABS: 1.0000 | ORAL_TABLET | Freq: Every day | ORAL | 0 refills | Status: DC

## 2017-04-20 MED ORDER — OMEPRAZOLE 40 MG PO CPDR: 40.0000 mg | DELAYED_RELEASE_CAPSULE | Freq: Two times a day (BID) | ORAL | 1 refills | Status: DC

## 2017-04-20 MED ORDER — NA SULFATE-K SULFATE-MG SULF 17.5-3.13-1.6 GM/177ML PO SOLN: 1.0000 | Freq: Once | ORAL | 0 refills | Status: AC

## 2017-04-20 NOTE — Progress Notes (Signed)
HPI :  62 year old female with a history of peptic ulcer disease, GERD chronic constipation, rheumatoid arthritis, here for new patient visit, referred by Chana Bode, to establish here for her GI care.  She was previously followed by Dr. Deatra Ina, she has not been seen since 2010.  She has multiple issues she wishes to discuss today.  She has been experiencing epigastric pain she states approximately for the past year.  She has intermittent pain which comes and goes.  She denies any heartburn, or regurgitation.  She states her baseline reflux symptoms appear well controlled on omeprazole 40 mg once per day.  She denies any dysphasia.  She denies any NSAID use.  She describes a stabbing pain in her epigastric area.  She has some nausea and some vomiting at times associated with this.  She has had a history of a cholecystectomy.  She states she is under a great deal of stress and is caring for multiple family members who have been in the hospital recently.  She thinks is related to her symptoms.  She thinks it gets worse with more stress.  She denies any weight loss.  She had an endoscopy in 2010 showing multiple ulcers in the gastric antrum.  She tested negative for H. pylori at that time.  She has not had a follow-up endoscopy.  She otherwise has long-standing chronic constipation.  She has a bowel movement for days.  She states she drinks mineral oil and takes additional laxatives a few days a week.  She states takes about a half an hour to have a bowel movement she has straining with this.  She has occasional rectal bleeding with this she attributes to hemorrhoids.  She has had a history of hemorrhoid surgery in the past.  She has taken MiraLAX in the past although unclear how much she was taking.  She has tried Linzess in the past but does not know which dose she has been given.  She does have a prescription for Norco which she takes for back pain, she states she takes this about once every 1-2 months  and does not a frequent medication.  Her last colonoscopy was in 2010 without polyps.  She otherwise has a history of hepatitis related to methotrexate toxicity, she underwent a liver biopsy in 2009 her LFTs have since returned to normal stopping methotrexate.  Endoscopic history: EGD 04/23/2008 - multiple antral ulcers, small Colonoscopy 04/23/2008 - diverticulosis, internal hemorrhoids, good prep    Past Medical History:  Diagnosis Date  . Chronic constipation   . Cluster headache syndrome 04/27/13  . Colon polyp 06/11/2011   Most recent 04/23/08, no polyp--mild diverticulosis.  Repeat 10 yrs.  . Depression with anxiety 02/22/2008   Qualifier: Diagnosis of  By: Nolon Rod CMA (AAMA), Robin    . Diverticulosis   . GERD (gastroesophageal reflux disease) 06/11/2011  . Hypertension    Recently diagnosed  . Migraine syndrome   . Osteopenia 11/2016   bone density. plan repeat in 11/2018  . Overweight(278.02) 06/11/2011  . PUD (peptic ulcer disease) 04/23/08   Multiple antrum ulcers; h pylori neg  . Rheumatoid arthritis(714.0)      Past Surgical History:  Procedure Laterality Date  . ABDOMINAL HYSTERECTOMY    . APPENDECTOMY    . BACK SURGERY     3 times  . CARDIOVASCULAR STRESS TEST  06/22/11   Normal; EF 65%  . CHOLECYSTECTOMY    . COLONOSCOPY  04/23/2008   mild diverticulosis, internal hemorrhoids; Dr. Herbie Baltimore  Deatra Ina  . LIVER BIOPSY  03/2007   Chronic active hepatitis w/fibrosis--related to hx of methotrexate treatment.   Family History  Problem Relation Age of Onset  . Diabetes Mother   . Stroke Mother   . Arthritis Father   . Hypertension Father   . Alcohol abuse Sister   . Cancer Sister        mesothelioma  . Heart disease Sister        cardiomegaly  . Hypertension Sister   . Pulmonary embolism Sister   . Hypertension Brother   . Polycystic ovary syndrome Daughter   . Heart disease Maternal Grandmother   . Heart disease Maternal Grandfather    Social History   Tobacco  Use  . Smoking status: Never Smoker  . Smokeless tobacco: Never Used  Substance Use Topics  . Alcohol use: Yes    Comment: Occasional  . Drug use: No   Current Outpatient Medications  Medication Sig Dispense Refill  . ALPRAZolam (XANAX) 0.5 MG tablet Take 1 tablet (0.5 mg total) by mouth at bedtime. 60 tablet 2  . calcium carbonate (CALCIUM 600) 600 MG TABS tablet Take 1 tablet (600 mg total) by mouth 2 (two) times daily with a meal. 180 tablet 3  . Cholecalciferol (VITAMIN D3) 10000 units TABS Take 1 tablet by mouth daily. 90 tablet 3  . gabapentin (NEURONTIN) 300 MG capsule TAKE (1) CAPSULE THREE TIMES DAILY. 270 capsule 1  . HYDROcodone-acetaminophen (NORCO) 7.5-325 MG per tablet Take 1 tablet by mouth as needed. Reported on 09/05/2015    . Insulin Pen Needle (PEN NEEDLES) 31G X 8 MM MISC 1 each by Does not apply route daily. 100 each 11  . liraglutide (VICTOZA) 18 MG/3ML SOPN Inject 0.3 mLs (1.8 mg total) into the skin at bedtime. 9 mL 5  . lisinopril (PRINIVIL,ZESTRIL) 20 MG tablet Take 1 tablet (20 mg total) by mouth daily. 90 tablet 3  . metFORMIN (GLUCOPHAGE) 500 MG tablet Take 1 tablet (500 mg total) by mouth daily with breakfast. 90 tablet 1  . omeprazole (PRILOSEC) 40 MG capsule TAKE ONE CAPSULE BY MOUTH EVERY DAY 90 capsule 0  . raloxifene (EVISTA) 60 MG tablet Take 1 tablet (60 mg total) by mouth daily. 30 tablet 11  . rosuvastatin (CRESTOR) 20 MG tablet Take 1 tablet (20 mg total) by mouth at bedtime. 90 tablet 1  . SUMAtriptan (IMITREX) 100 MG tablet Take 1 tablet (100 mg total) by mouth every 2 (two) hours as needed for migraine. May repeat in 2 hours if headache persists or recurs. 9 tablet 4  . verapamil (CALAN-SR) 240 MG CR tablet Take 1 tablet (240 mg total) by mouth at bedtime. 90 tablet 3   No current facility-administered medications for this visit.    Allergies  Allergen Reactions  . Bee Venom Anaphylaxis     Review of Systems: All systems reviewed and  negative except where noted in HPI.   Lab Results  Component Value Date   WBC 7.0 05/19/2016   HGB 12.9 05/19/2016   HCT 38.5 05/19/2016   MCV 90.4 05/19/2016   PLT 287 05/19/2016    Lab Results  Component Value Date   CREATININE 0.95 12/16/2016   BUN 13 12/16/2016   NA 142 12/16/2016   K 4.5 12/16/2016   CL 108 12/16/2016   CO2 25 12/16/2016    Lab Results  Component Value Date   ALT 18 12/16/2016   AST 14 12/16/2016   ALKPHOS 59 05/19/2016  BILITOT 0.2 12/16/2016     Physical Exam: BP 120/80   Pulse 78   Ht 5\' 8"  (1.727 m)   Wt 239 lb (108.4 kg)   BMI 36.34 kg/m  Constitutional: Pleasant,well-developed, female in no acute distress. HEENT: Normocephalic and atraumatic. Conjunctivae are normal. No scleral icterus. Neck supple.  Cardiovascular: Normal rate, regular rhythm.  Pulmonary/chest: Effort normal and breath sounds normal. No wheezing, rales or rhonchi. Abdominal: Soft, protuberant, nontender, mildly positive [place]. . There are no masses palpable.  Extremities: no edema Lymphadenopathy: No cervical adenopathy noted. Neurological: Alert and oriented to person place and time. Skin: Skin is warm and dry. No rashes noted. Psychiatric: Normal mood and affect. Behavior is normal.   ASSESSMENT AND PLAN: 63 year old female here for evaluation of the following issues:  Epigastric pain / nausea vomiting / history of peptic ulcer -she is status post cholecystectomy.  She has been compliant with her omeprazole and her heartburn seems well controlled.  She does have a history of peptic ulcer disease for which she never had a follow-up endoscopy.  Given her postprandial symptoms recommend upper endoscopy to further evaluate.  I discussed the risks and benefits of endoscopy with her and she want to proceed.  She will empirically increase her omeprazole to twice daily in the interim to see if this helps.  If her endoscopy is unremarkable will consider cross-sectional  imaging of the abdomen if symptoms persist, while functional dyspepsia is also possible with ongoing life stressors as outlined in HPI.  She agreed.  Chronic constipation / rectal bleeding -she has chronic constipation, and I suspect hemorrhoid related bleeding, but given her bleeding symptoms and it has been 9 years since her last colonoscopy, recommend colonoscopy at the same time as her upper endoscopy to ensure no other pathology.  In the interim I will give her a free sample of Trulance to take once daily to see if this helps.  If this helps I will prescribe this for her.  If no benefit at all to [facility], as it appears she has also failed Linzess, I will refer her for anorectal manometry to rule out pelvic floor dyssynergia.  She agreed and will await colonoscopy results and her course.  If bleeding is due to hemorrhoids we will also consider hemorrhoid banding.  [place]adeville Cellar, MD [facility] Pager [phone]  CC: Tysinger, Camelia Eng, PA-C

## 2017-04-20 NOTE — Patient Instructions (Addendum)
If you are age 64 or older, your body mass index should be between 23-30. Your Body mass index is 36.34 kg/m. If this is out of the aforementioned range listed, please consider follow up with your Primary Care Provider.  If you are age 105 or younger, your body mass index should be between 19-25. Your Body mass index is 36.34 kg/m. If this is out of the aformentioned range listed, please consider follow up with your Primary Care Provider.   You have been scheduled for an endoscopy and colonoscopy. Please follow the written instructions given to you at your visit today. Please pick up your prep supplies at the pharmacy within the next 1-3 days. If you use inhalers (even only as needed), please bring them with you on the day of your procedure. Your physician has requested that you go to www.startemmi.com and enter the access code given to you at your visit today. This web site gives a general overview about your procedure. However, you should still follow specific instructions given to you by our office regarding your preparation for the procedure.  We have given you samples of the following medication to take: Trulance, Take 1 a day Omeprazole, Take twice a day   Thank you for entrusting me with your care and for choosing [employer], Dr. Shallowater Cellar

## 2017-05-10 ENCOUNTER — Encounter: Payer: Self-pay | Admitting: Gastroenterology

## 2017-05-23 ENCOUNTER — Ambulatory Visit (AMBULATORY_SURGERY_CENTER): Payer: Medicare HMO | Admitting: Gastroenterology

## 2017-05-23 ENCOUNTER — Other Ambulatory Visit: Payer: Self-pay

## 2017-05-23 ENCOUNTER — Encounter: Payer: Self-pay | Admitting: Gastroenterology

## 2017-05-23 VITALS — BP 113/47 | HR 56 | Temp 97.9°F | Resp 12 | Ht 68.0 in | Wt 239.0 lb

## 2017-05-23 DIAGNOSIS — I10 Essential (primary) hypertension: Secondary | ICD-10-CM | POA: Diagnosis not present

## 2017-05-23 DIAGNOSIS — K921 Melena: Secondary | ICD-10-CM

## 2017-05-23 DIAGNOSIS — K625 Hemorrhage of anus and rectum: Secondary | ICD-10-CM | POA: Diagnosis not present

## 2017-05-23 DIAGNOSIS — D122 Benign neoplasm of ascending colon: Secondary | ICD-10-CM | POA: Diagnosis not present

## 2017-05-23 DIAGNOSIS — K2980 Duodenitis without bleeding: Secondary | ICD-10-CM | POA: Diagnosis not present

## 2017-05-23 DIAGNOSIS — K2950 Unspecified chronic gastritis without bleeding: Secondary | ICD-10-CM | POA: Diagnosis not present

## 2017-05-23 DIAGNOSIS — R1013 Epigastric pain: Secondary | ICD-10-CM | POA: Diagnosis not present

## 2017-05-23 DIAGNOSIS — E119 Type 2 diabetes mellitus without complications: Secondary | ICD-10-CM | POA: Diagnosis not present

## 2017-05-23 DIAGNOSIS — R112 Nausea with vomiting, unspecified: Secondary | ICD-10-CM

## 2017-05-23 MED ORDER — AMBULATORY NON FORMULARY MEDICATION: 0 refills | Status: DC

## 2017-05-23 MED ORDER — PLECANATIDE 3 MG PO TABS: 3.0000 mg | ORAL_TABLET | Freq: Every day | ORAL | 3 refills | Status: DC

## 2017-05-23 MED ORDER — SODIUM CHLORIDE 0.9 % IV SOLN: 500.0000 mL | Freq: Once | INTRAVENOUS | Status: DC

## 2017-05-23 NOTE — Progress Notes (Signed)
Report given to PACU, vss 

## 2017-05-23 NOTE — Progress Notes (Signed)
Called to room to assist during endoscopic procedure.  Patient ID and intended procedure confirmed with present staff. Received instructions for my participation in the procedure from the performing physician.  

## 2017-05-23 NOTE — Op Note (Signed)
[name] Procedure Date: 05/23/2017 3:06 PM MRN: [id number] Endoscopist: Remo Lipps P. Neveen Daponte MD, MD Age: 62 Referring MD:  Date of Birth: [birth year] Gender: Female Account #: [id number] Procedure:                Colonoscopy Indications:              Rectal bleeding, constipation - now on Trulance                            with improvement in symptoms Medicines:                Monitored Anesthesia Care Procedure:                Pre-Anesthesia Assessment:                           - Prior to the procedure, a History and Physical                            was performed, and patient medications and                            allergies were reviewed. The patient's tolerance of                            previous anesthesia was also reviewed. The risks                            and benefits of the procedure and the sedation                            options and risks were discussed with the patient.                            All questions were answered, and informed consent                            was obtained. Prior Anticoagulants: The patient has                            taken no previous anticoagulant or antiplatelet                            agents. ASA Grade Assessment: II - A patient with                            mild systemic disease. After reviewing the risks                            and benefits, the patient was deemed in                            satisfactory condition to undergo the procedure.  After obtaining informed consent, the colonoscope                            was passed under direct vision. Throughout the                            procedure, the patient's blood pressure, pulse, and                            oxygen saturations were monitored continuously. The                            Colonoscope was introduced through the anus and                            advanced to the the cecum,  identified by                            appendiceal orifice and ileocecal valve. The                            colonoscopy was performed without difficulty. The                            patient tolerated the procedure well. The quality                            of the bowel preparation was adequate. The                            ileocecal valve, appendiceal orifice, and rectum                            were photographed. Scope In: 3:18:48 PM Scope Out: 3:45:38 PM Scope Withdrawal Time: 0 hours 21 minutes 59 seconds  Total Procedure Duration: 0 hours 26 minutes 50 seconds  Findings:                 The perianal exam findings include anal fissure and                            skin tags.                           Two sessile polyps were found in the ascending                            colon. The polyps were 5 to 7 mm in size. These                            polyps were removed with a cold snare. Resection                            and retrieval were complete.  Internal hemorrhoids were found during retroflexion.                           The bowel prep was only fair in the right colon.                            Several minutes were spent lavaging the area to                            ensure adequate views obtained. The exam was                            otherwise without abnormality. Complications:            No immediate complications. Estimated blood loss:                            Minimal. Estimated Blood Loss:     Estimated blood loss was minimal. Impression:               - Anal fissure and perianal skin tags found on                            perianal exam.                           - Two 5 to 7 mm polyps in the ascending colon,                            removed with a cold snare. Resected and retrieved.                           - Internal hemorrhoids.                           - The examination was otherwise normal.                            I suspect anal fissure, +/- hemorrhoids in the                            setting of constipation has caused bleeding                            symptoms. Recommendation:           - Patient has a contact number available for                            emergencies. The signs and symptoms of potential                            delayed complications were discussed with the                            patient. Return to normal activities tomorrow.  Written discharge instructions were provided to the                            patient.                           - Resume previous diet.                           - Continue present medications.                           - Continue Trulance if this is working for                            constipation                           - Start topical 0.125% nitroglycerin - apply a pea                            sized amount every 8 hours to treat fissure                           - Await pathology results.                           - Repeat colonoscopy is recommended for                            surveillance. The colonoscopy date will be                            determined after pathology results from today's                            exam become available for review. Remo Lipps P. Aakash Hollomon MD, MD 05/23/2017 3:51:13 PM This report has been signed electronically.

## 2017-05-23 NOTE — Progress Notes (Signed)
Emma Flock, RN called over and said pt asked that she be awake when Dr. Havery Moros speaks with her.  Her husband has some dementia.  I asked if he was her driver and Jinny Blossom, RN reported he was.  Pt said she had no other one to bring her today.  Pt stayed longer in the recovery room to wake up.  Pt had had her eyes open and she was steady on her feet before being discharged.  I went over instructions with pt and her husband.  And explained everything was written down on the AVS.  No complaints noted in the recovery room.  Written rx was given to pt's husband on nitro gel.  Rx was sent to [facility].  maw

## 2017-05-23 NOTE — Patient Instructions (Signed)
YOU HAD AN ENDOSCOPIC PROCEDURE TODAY AT [facility]:   Refer to the procedure report that was given to you for any specific questions about what was found during the examination.  If the procedure report does not answer your questions, please call your gastroenterologist to clarify.  If you requested that your care partner not be given the details of your procedure findings, then the procedure report has been included in a sealed envelope for you to review at your convenience later.  YOU SHOULD EXPECT: Some feelings of bloating in the abdomen. Passage of more gas than usual.  Walking can help get rid of the air that was put into your GI tract during the procedure and reduce the bloating. If you had a lower endoscopy (such as a colonoscopy or flexible sigmoidoscopy) you may notice spotting of blood in your stool or on the toilet paper. If you underwent a bowel prep for your procedure, you may not have a normal bowel movement for a few days.  Please Note:  You might notice some irritation and congestion in your nose or some drainage.  This is from the oxygen used during your procedure.  There is no need for concern and it should clear up in a day or so.  SYMPTOMS TO REPORT IMMEDIATELY:   Following lower endoscopy (colonoscopy or flexible sigmoidoscopy):  Excessive amounts of blood in the stool  Significant tenderness or worsening of abdominal pains  Swelling of the abdomen that is new, acute  Fever of 100F or higher   Following upper endoscopy (EGD)  Vomiting of blood or coffee ground material  New chest pain or pain under the shoulder blades  Painful or persistently difficult swallowing  New shortness of breath  Fever of 100F or higher  Black, tarry-looking stools  For urgent or emergent issues, a gastroenterologist can be reached at any hour by calling [phone].   DIET:  We do recommend a small meal at first, but then you may proceed to your regular diet.  Drink  plenty of fluids but you should avoid alcoholic beverages for 24 hours.  ACTIVITY:  You should plan to take it easy for the rest of today and you should NOT DRIVE or use heavy machinery until tomorrow (because of the sedation medicines used during the test).    FOLLOW UP: Our staff will call the number listed on your records the next business day following your procedure to check on you and address any questions or concerns that you may have regarding the information given to you following your procedure. If we do not reach you, we will leave a message.  However, if you are feeling well and you are not experiencing any problems, there is no need to return our call.  We will assume that you have returned to your regular daily activities without incident.  If any biopsies were taken you will be contacted by phone or by letter within the next 1-3 weeks.  Please call us at [phone] if you have not heard about the biopsies in 3 weeks.    SIGNATURES/CONFIDENTIALITY: You and/or your care partner have signed paperwork which will be entered into your electronic medical record.  These signatures attest to the fact that that the information above on your After Visit Summary has been reviewed and is understood.  Full responsibility of the confidentiality of this discharge information lies with you and/or your care-partner.   Handouts were given to your care partner on polyps and  hemorrhoids. You may resume your current medications today.  We have given you a prescription for nitroglycerin 0.125% gel to take to [facility]. You should apply a pea size amount to your rectum three times daily x 6-8 weeks.   [facility]'s information is below:  Address: [address], [place], [place] [address]  [phone]         Await biopsy results. Please call if any questions or concerns.

## 2017-05-23 NOTE — Op Note (Signed)
[name] Patient Name: Emma [name] Procedure Date: 05/23/2017 3:06 PM MRN: [id number] Endoscopist: Remo Lipps P. Armbruster MD, MD Age: 62 Referring MD:  Date of Birth: 07-Jul-1955 Gender: Female Account #: [id number] Procedure:                Upper GI endoscopy Indications:              Epigastric abdominal pain, Nausea with vomiting,                            history of PUD, Medicines:                Monitored Anesthesia Care Procedure:                Pre-Anesthesia Assessment:                           - Prior to the procedure, a History and Physical                            was performed, and patient medications and                            allergies were reviewed. The patient's tolerance of                            previous anesthesia was also reviewed. The risks                            and benefits of the procedure and the sedation                            options and risks were discussed with the patient.                            All questions were answered, and informed consent                            was obtained. Prior Anticoagulants: The patient has                            taken no previous anticoagulant or antiplatelet                            agents. ASA Grade Assessment: II - A patient with                            mild systemic disease. After reviewing the risks                            and benefits, the patient was deemed in                            satisfactory condition to undergo the procedure.  After obtaining informed consent, the endoscope was                            passed under direct vision. Throughout the                            procedure, the patient's blood pressure, pulse, and                            oxygen saturations were monitored continuously. The                            Model GIF-[id number] scope was introduced                            through the mouth, and advanced to  the second part                            of duodenum. The upper GI endoscopy was                            accomplished without difficulty. The patient                            tolerated the procedure well. Scope In: Scope Out: Findings:                 Esophagogastric landmarks were identified: the                            Z-line was found at 36 cm, the gastroesophageal                            junction was found at 36 cm and the upper extent of                            the gastric folds was found at 36 cm from the                            incisors.                           The exam of the esophagus was otherwise normal.                           The entire examined stomach was normal. Biopsies                            were taken from the antrum, body, and incisura with                            a cold forceps for Helicobacter pylori testing  given the patient's dyspepsia.                           Localized nodular mucosa was found in the duodenal                            bulb, I suspect benign Brunner gland hyperplasia.                            Biopsies were taken with a cold forceps for                            histology to ensure no adenomatous change.                           The exam of the duodenum was otherwise normal. Complications:            No immediate complications. Estimated blood loss:                            Minimal. Estimated Blood Loss:     Estimated blood loss was minimal. Impression:               - Esophagogastric landmarks identified.                           - Normal esophagus                           - Normal stomach. Biopsied to rule out H pylori.                           - Nodular mucosa in the duodenal bulb, suspect                            benign Brunner gland hyperplasia. Biopsied.                           No overt pathology noted to cause the patient                            symptoms. Will await  biopsy results, suspect                            patient may have dyspepsia given symptoms and their                            relation to life stressors Recommendation:           - Patient has a contact number available for                            emergencies. The signs and symptoms of potential                            delayed complications were discussed with  the                            patient. Return to normal activities tomorrow.                            Written discharge instructions were provided to the                            patient.                           - Resume previous diet.                           - Continue present medications.                           - Await pathology results with further                            recommendations Remo Lipps P. Armbruster MD, MD 05/23/2017 3:55:45 PM This report has been signed electronically.

## 2017-05-24 ENCOUNTER — Telehealth: Payer: Self-pay

## 2017-05-24 ENCOUNTER — Telehealth: Payer: Self-pay | Admitting: *Deleted

## 2017-05-24 NOTE — Telephone Encounter (Signed)
No answer, phone busy.

## 2017-05-24 NOTE — Telephone Encounter (Signed)
  Follow up Call-  Call back number 05/23/2017  Post procedure Call Back phone  # [phone]  Permission to leave phone message Yes  Some recent data might be hidden     Patient questions:  Do you have a fever, pain , or abdominal swelling? yes Pain Score  2 * Patient c/o "my jaw hurting last night and today." Told the patient that this likely was from a jaw thrust during or after the procedure for airway protection purposes. Patient is eating and drinking fine with no other issues.   Have you tolerated food without any problems? Yes.    Have you been able to return to your normal activities? Yes.    Do you have any questions about your discharge instructions: Diet   No. Medications  No. Follow up visit  No.  Do you have questions or concerns about your Care? No.  Actions: * If pain score is 4 or above: No action needed, pain <4.

## 2017-05-31 ENCOUNTER — Telehealth: Payer: Self-pay | Admitting: *Deleted

## 2017-05-31 ENCOUNTER — Telehealth: Payer: Self-pay | Admitting: Gastroenterology

## 2017-05-31 MED ORDER — LUBIPROSTONE 24 MCG PO CAPS: 24.0000 ug | ORAL_CAPSULE | Freq: Two times a day (BID) | ORAL | 3 refills | Status: DC

## 2017-05-31 NOTE — Telephone Encounter (Signed)
See result note from [facility] from 05-23-17.  Pt said Nitroglycerin is helping.  Trulance is too expensive.  Said Dr. Loni Muse said we can try Amitiza. Will send Rx for 41mcg, BID and she will let us know who it is covered by insurance.  She hasn't been able to find [facility] but will keep looking at different pharmacies. Per Dr. Loni Muse could try double dose of Miralax once to twice daily if Amitiza not covered well.

## 2017-05-31 NOTE — Telephone Encounter (Signed)
error 

## 2017-06-01 NOTE — Progress Notes (Signed)
[facility]  PATIENT: Emma [name] DOB: May 29, 1955   REASON FOR VISIT: Follow-up for  cluster headaches  HISTORY FROM: Patient    HISTORY OF PRESENT ILLNESS: 62 year old Caucasian female who previously seen Dr. Leonie Man approximately 3 years ago for left-sided head pain who was lost to followup at that time with persistent left-sided head pain which has worsened in the last year. She taking Topamax 50 mg twice a day without relief. She reports these headaches are sharp , shooting and start behind left eye and shoot to left sideof face and neck may last up to 2 days where she remains in the bed. She has to be in the dark for relief. During that time her mother died.  2011/10/28 (JM): Returns for follow up visit since 07/27/11. Reports she is having headaches 3 days per week, was 2 days per week. She has been taking Topamax 100mg  BID, tolerating well but no relief. She has also been taking gabapentin 100mg  daily, denies any side effects she was concerned about having seizures and not being able to stop the medication. She can not relate any triggers to her headaches. Today she has a severe headache on the left side that started on Sunday and has not had any relief. Reports photophobia and intermittent nausea. She has tried Maxalt in the past but stopped secondary to cost, this was prior to going generic.  She also has history of degenerative disc disease.  04/27/13 (LL): She comes in for worsening headaches. Last here in mid 2013. She has continued to take Gabapentin since that time. But sees no benefit. Headaches are always left sided, temporal and parietal, vascular type which regularly wake her up at 4 am. They are accompanied with left eye injection, without tearing. She often has to stay in dark room most of the day with headache, and nausea often is present. She endorses poor sleep, even with alprazolam, soma, mirapex and lunesta nightly.  08/31/13 (LL): Emma [name] comes in for  follow up for cluster headaches.  She has been doing much better on Verapamil as preventative.  She is sleeping better as well since starting this which is helping as well.  She has not had any severe headaches in 3 months.  She has not had to take Rizatriptan in over a month.  Since last here her sister died suddenly from a PE. Her husband is also seen in this practice for mild cognitive impairment. Update 11/19/2014 PS: She returns for follow-up after last visit more than a year ago. She continues to do well and has only occasional cluster headaches mainly involving the left eye and upon awakening from sleep. She takes Maxalt which works quite well. She remains on Calan SR 240 mg at night is tolerating it well without side effects. The headache is described as constant and severe headache 10/10 in severity but not a complaint of nausea vomiting redness or tearing. She does have sensitivity to light and sound and needs to lie down with the headaches. She has no other complaints. UPDATE 10/10/2017CM Emma [name], 62 year old female returns for follow-up. She was last seen in the office August 2016. She has history of headaches and she has had her current headache for over a week. Her headaches generally wake her up from the left side and are of a vascular type headache. She occasionally has nausea but no vomiting. She does have sensitivity to light and sound , it helps to lay in a dark room Her prescription for Maxalt ran  out in  August. She says it works sometimes, sometimes not She remains on verapamil 240 daily refilled by her primary care. She is also on Neurontin 300 when necessary, she does not take this every day. Diabetes is in fairly good control at present. She returns for reevaluation. She claims she has a lot of stress in her life taking care of elderly husband who has dementia.  UPDATE 01/10/2018CM Emma [name], 62 year old female returns for follow-up with a history of headaches. She has a migraine today  with a pain level of 3. She is requesting infusion. When last seen she was given Depacon IV which was beneficial. She is currently on verapamil 240 as a preventive and her Maxalt was changed at her last visit to Imitrex 50 mg prn. She claims she usually has to use 2 tablets so we can increase the dose to 100 mg today. She also takes gabapentin 3 times daily. She claims her diabetes is apparently fairly good control. She continues to be stressed having to take care of her demented husband's condition continues to be progressive Her headaches can wake her up generally on the left side and are of a vascular type headache. She is occasionally nauseated but no vomiting she does have sensitivity to light and sound. She returns for reevaluation UPDATE 3/14/2019CM Emma [name], 62 year old female returns for follow-up with a history of headaches.  When last seen she was given Depacon infusion with good results.  She is currently on verapamil 240mg   as preventive medication and Maxalt was changed to Imitrex her headaches are tolerable.  She has a lot of stress taking care of a husband with dementia, an aunt with cancer and a father with heart disease that is frequently hospitalized.  She is also on Neurontin for back pain which is been beneficial for her headaches.  Her  Headaches are generally on the left side and are of a vascular type headache.  She can be nauseated, occasionally has sensitivity to light and sound.  She is not exercising at present and was encouraged to get back into her yoga.  She returns for reevaluation  REVIEW OF SYSTEMS: Full 14 system review of systems performed and notable only for those listed, all others are neg:  Constitutional: neg  Cardiovascular: neg Ear/Nose/Throat: neg  Skin: neg Eyes: Sensitivity to light Respiratory: neg Gastroitestinal: neg  Hematology/Lymphatic: neg  Endocrine: neg Musculoskeletal: Joint pain, back pain Allergy/Immunology: Allergies Neurological:  Headache Psychiatric: Anxiety Sleep : Restless legs    ALLERGIES: Allergies  Allergen Reactions  . Bee Venom Anaphylaxis    HOME MEDICATIONS: Outpatient Medications Prior to Visit  Medication Sig Dispense Refill  . ALPRAZolam (XANAX) 0.5 MG tablet Take 1 tablet (0.5 mg total) by mouth at bedtime. 60 tablet 2  . AMBULATORY NON FORMULARY MEDICATION Medication Name: *Nitroglycerin 0.125% gel. Apply a pea size amount to your rectum three times daily x 6-8 weeks 30 g 0  . calcium carbonate (CALCIUM 600) 600 MG TABS tablet Take 1 tablet (600 mg total) by mouth 2 (two) times daily with a meal. 180 tablet 3  . carisoprodol (SOMA) 350 MG tablet TAKE 1 TABLET BY MOUTH TWICE A DAY AND AT BEDTIME AS NEEDED FOR MUSCLE SPASMS  0  . Cholecalciferol (VITAMIN D3) 10000 units TABS Take 1 tablet by mouth daily. 90 tablet 3  . etodolac (LODINE) 400 MG tablet TAKE 1 TABLET BY MOUTH TWICE A DAY WITH FOOD AS NEEDED  1  . gabapentin (NEURONTIN) 300 MG capsule  TAKE (1) CAPSULE THREE TIMES DAILY. 270 capsule 1  . HYDROcodone-acetaminophen (NORCO) 7.5-325 MG per tablet Take 1 tablet by mouth as needed. Reported on 09/05/2015    . Insulin Pen Needle (PEN NEEDLES) 31G X 8 MM MISC 1 each by Does not apply route daily. 100 each 11  . liraglutide (VICTOZA) 18 MG/3ML SOPN Inject 0.3 mLs (1.8 mg total) into the skin at bedtime. 9 mL 5  . lisinopril (PRINIVIL,ZESTRIL) 20 MG tablet Take 1 tablet (20 mg total) by mouth daily. 90 tablet 3  . lubiprostone (AMITIZA) 24 MCG capsule Take 1 capsule (24 mcg total) by mouth 2 (two) times daily with a meal. 60 capsule 3  . metFORMIN (GLUCOPHAGE) 500 MG tablet Take 1 tablet (500 mg total) by mouth daily with breakfast. 90 tablet 1  . omeprazole (PRILOSEC) 40 MG capsule Take 1 capsule (40 mg total) by mouth 2 (two) times daily. 180 capsule 1  . Plecanatide (TRULANCE) 3 MG TABS Take 3 mg by mouth daily. 90 tablet 3  . raloxifene (EVISTA) 60 MG tablet Take 1 tablet (60 mg total) by mouth  daily. 30 tablet 11  . rosuvastatin (CRESTOR) 20 MG tablet Take 1 tablet (20 mg total) by mouth at bedtime. 90 tablet 1  . SUMAtriptan (IMITREX) 100 MG tablet Take 1 tablet (100 mg total) by mouth every 2 (two) hours as needed for migraine. May repeat in 2 hours if headache persists or recurs. 9 tablet 4  . verapamil (CALAN-SR) 240 MG CR tablet Take 1 tablet (240 mg total) by mouth at bedtime. 90 tablet 3  . Plecanatide (TRULANCE) 3 MG TABS Take 1 tablet by mouth daily. 7 tablet 0   Facility-Administered Medications Prior to Visit  Medication Dose Route Frequency Provider Last Rate Last Dose  . 0.9 %  sodium chloride infusion  500 mL Intravenous Once Armbruster, Carlota Raspberry, MD        PAST MEDICAL HISTORY: Past Medical History:  Diagnosis Date  . Chronic constipation   . Cluster headache syndrome 04/27/13  . Colon polyp 06/11/2011   Most recent 04/23/08, no polyp--mild diverticulosis.  Repeat 10 yrs.  . Depression with anxiety 02/22/2008   Qualifier: Diagnosis of  By: Nolon Rod CMA (AAMA), Robin    . Diverticulosis   . GERD (gastroesophageal reflux disease) 06/11/2011  . Hypertension    Recently diagnosed  . Migraine syndrome   . Osteopenia 11/2016   bone density. plan repeat in 11/2018  . Overweight(278.02) 06/11/2011  . PUD (peptic ulcer disease) 04/23/08   Multiple antrum ulcers; h pylori neg  . Rheumatoid arthritis(714.0)     PAST SURGICAL HISTORY: Past Surgical History:  Procedure Laterality Date  . ABDOMINAL HYSTERECTOMY    . APPENDECTOMY    . BACK SURGERY     3 times  . CARDIOVASCULAR STRESS TEST  06/22/11   Normal; EF 65%  . CHOLECYSTECTOMY    . COLONOSCOPY  04/23/2008   mild diverticulosis, internal hemorrhoids; Dr. Erskine Emery  . LIVER BIOPSY  03/2007   Chronic active hepatitis w/fibrosis--related to hx of methotrexate treatment.    FAMILY HISTORY: Family History  Problem Relation Age of Onset  . Diabetes Mother   . Stroke Mother   . Arthritis Father   .  Hypertension Father   . Atrial fibrillation Father   . Alcohol abuse Sister   . Cancer Sister        mesothelioma  . Heart disease Sister        cardiomegaly  .  Hypertension Sister   . Pulmonary embolism Sister   . Hypertension Brother   . Polycystic ovary syndrome Daughter   . Heart disease Maternal Grandmother   . Heart disease Maternal Grandfather   . Colon cancer Paternal Uncle   . Esophageal cancer Neg Hx   . Stomach cancer Neg Hx     SOCIAL HISTORY: Social History   Socioeconomic History  . Marital status: Married    Spouse name: Not on file  . Number of children: 1  . Years of education: Not on file  . Highest education level: Not on file  Social Needs  . Financial resource strain: Not on file  . Food insecurity - worry: Not on file  . Food insecurity - inability: Not on file  . Transportation needs - medical: Not on file  . Transportation needs - non-medical: Not on file  Occupational History  . Not on file  Tobacco Use  . Smoking status: Never Smoker  . Smokeless tobacco: Never Used  Substance and Sexual Activity  . Alcohol use: Yes    Comment: Occasional  . Drug use: No  . Sexual activity: Yes  Other Topics Concern  . Not on file  Social History Narrative  . Not on file     PHYSICAL EXAM  Vitals:   06/02/17 1509  BP: 132/78  Pulse: 72  Weight: 241 lb (109.3 kg)   Body mass index is 36.64 kg/m.  Generalized: Well developed, Obese female in no acute distress  Head: normocephalic and atraumatic,. Oropharynx benign  Neck: Supple, no carotid bruits  Cardiac: Regular rate rhythm, no murmur  Musculoskeletal: No deformity   Neurological examination   Mentation: Alert oriented to time, place, history taking. Attention span and concentration appropriate. Recent and remote memory intact.  Follows all commands speech and language fluent.   Cranial nerve II-XII: Pupils were equal round reactive to light extraocular movements were full, visual field  were full on confrontational test. Facial sensation and strength were normal. hearing was intact to finger rubbing bilaterally. Uvula tongue midline. head turning and shoulder shrug were normal and symmetric.Tongue protrusion into cheek strength was normal. Motor: normal bulk and tone, full strength in the BUE, BLE, fine finger movements normal, no pronator drift. No focal weakness Sensory: normal and symmetric to light touch, pinprick, and  Vibration, in the upper and lower extremities Coordination: finger-nose-finger, heel-to-shin bilaterally, no dysmetria Reflexes: Symmetric upper and lower plantar responses were flexor bilaterally. Gait and Station: Rising up from seated position without assistance, normal stance,  moderate stride, good arm swing, smooth turning, able to perform tiptoe, and heel walking without difficulty. Tandem gait is steady  DIAGNOSTIC DATA (LABS, IMAGING, TESTING) - I reviewed patient records, labs, notes, testing and imaging myself where available.  Lab Results  Component Value Date   WBC 7.0 05/19/2016   HGB 12.9 05/19/2016   HCT 38.5 05/19/2016   MCV 90.4 05/19/2016   PLT 287 05/19/2016      Component Value Date/Time   NA 142 12/16/2016 1244   K 4.5 12/16/2016 1244   CL 108 12/16/2016 1244   CO2 25 12/16/2016 1244   GLUCOSE 86 12/16/2016 1244   BUN 13 12/16/2016 1244   CREATININE 0.95 12/16/2016 1244   CALCIUM 8.7 12/16/2016 1244   PROT 6.6 12/16/2016 1244   ALBUMIN 4.3 05/19/2016 1546   AST 14 12/16/2016 1244   ALT 18 12/16/2016 1244   ALKPHOS 59 05/19/2016 1546   BILITOT 0.2 12/16/2016 1244  Lab Results  Component Value Date   CHOL 164 12/16/2016   HDL 41 (L) 12/16/2016   LDLCALC 94 12/16/2016   TRIG 195 (H) 12/16/2016   CHOLHDL 4.0 12/16/2016   Lab Results  Component Value Date   HGBA1C 6.0 (H) 12/16/2016    Lab Results  Component Value Date   TSH 1.76 07/17/2015      ASSESSMENT AND PLAN 62 year old Caucasian female with over  5.5  years history of left-sided headache likely cluster headache syndrome with neuroulogic exam normal. She has done well since starting Verapamil, Imitrex added at last visit, works better than Maxalt.Marland Kitchen        PLAN: Continue  Imitrex  100mg  when necessary acute migraine Continue verapamil 240 daily Restart  Yoga and other exercise for stress release Follow-up in 6 months Keep a record of your headaches I spent 20 minutes  in total face to face time with the patient more than 50% of which was spent counseling and coordination of care, reviewing test results reviewing medications and discussing and reviewing the diagnosis of migraine/cluster headache  and further treatment options. Importance of keeping a diary if headaches worsen to include the time of the headache what you're doing any other specific information that would be useful. Discussed stress relief techniques such as deep breathing muscle relaxation mental relaxation to music. Discussed importance of exercise, regular meals  and sleep. Sleep deprivation can be a headache  trigger Follow-up in 6 months  Dennie Bible, [facility], [facility], APRN  [facility] [address], [place], [place] [address] [phone]

## 2017-06-02 ENCOUNTER — Ambulatory Visit: Payer: Medicare HMO | Admitting: Nurse Practitioner

## 2017-06-02 ENCOUNTER — Encounter: Payer: Self-pay | Admitting: Nurse Practitioner

## 2017-06-02 VITALS — BP 132/78 | HR 72 | Wt 241.0 lb

## 2017-06-02 DIAGNOSIS — G44029 Chronic cluster headache, not intractable: Secondary | ICD-10-CM

## 2017-06-02 MED ORDER — SUMATRIPTAN SUCCINATE 100 MG PO TABS: 100.0000 mg | ORAL_TABLET | ORAL | 6 refills | Status: DC | PRN

## 2017-06-02 NOTE — Patient Instructions (Signed)
Continue  Imitrex  100mg  when necessary acute migraine Continue verapamil 240 daily Restart  Yoga and other exercise for stress release Follow-up in 6 months

## 2017-06-03 NOTE — Progress Notes (Signed)
I agree with the above plan 

## 2017-06-19 ENCOUNTER — Other Ambulatory Visit: Payer: Self-pay | Admitting: Medical

## 2017-07-06 ENCOUNTER — Other Ambulatory Visit: Payer: Self-pay | Admitting: Medical

## 2017-07-06 ENCOUNTER — Telehealth: Payer: Self-pay | Admitting: Medical

## 2017-07-06 NOTE — Telephone Encounter (Signed)
Patient returned call and scheduled appt for medcheck on 07/20/17  She states that she will need refills before then

## 2017-07-06 NOTE — Telephone Encounter (Signed)
Called patient about making an appointment since last appointment was 6 months ago. Advised husband for her to call back regarding this.

## 2017-07-06 NOTE — Telephone Encounter (Signed)
Is this okay to refill? 

## 2017-07-07 MED ORDER — METFORMIN HCL 500 MG PO TABS: 500.0000 mg | ORAL_TABLET | Freq: Every day | ORAL | 0 refills | Status: DC

## 2017-07-07 NOTE — Telephone Encounter (Signed)
Called and left a message of pt's vm. Needing to know the exact meds she is needing refilled

## 2017-07-07 NOTE — Telephone Encounter (Signed)
Pt called and only needed lisinopril and xanax and metformin refilled. Lisinopril and xanax was refilled yesterday

## 2017-07-11 NOTE — Telephone Encounter (Signed)
Emma [name], is this complete?

## 2017-07-11 NOTE — Telephone Encounter (Signed)
This was done. I only needed to send in metformin

## 2017-07-20 ENCOUNTER — Encounter: Payer: Self-pay | Admitting: Medical

## 2017-07-20 ENCOUNTER — Ambulatory Visit (INDEPENDENT_AMBULATORY_CARE_PROVIDER_SITE_OTHER): Payer: Medicare HMO | Admitting: Medical

## 2017-07-20 ENCOUNTER — Telehealth: Payer: Self-pay | Admitting: Medical

## 2017-07-20 VITALS — BP 140/80 | HR 62 | Temp 97.7°F | Ht 66.5 in | Wt 240.0 lb

## 2017-07-20 DIAGNOSIS — M2550 Pain in unspecified joint: Secondary | ICD-10-CM

## 2017-07-20 DIAGNOSIS — I872 Venous insufficiency (chronic) (peripheral): Secondary | ICD-10-CM | POA: Diagnosis not present

## 2017-07-20 DIAGNOSIS — R0989 Other specified symptoms and signs involving the circulatory and respiratory systems: Secondary | ICD-10-CM | POA: Diagnosis not present

## 2017-07-20 DIAGNOSIS — Z79899 Other long term (current) drug therapy: Secondary | ICD-10-CM

## 2017-07-20 DIAGNOSIS — L989 Disorder of the skin and subcutaneous tissue, unspecified: Secondary | ICD-10-CM | POA: Diagnosis not present

## 2017-07-20 DIAGNOSIS — N289 Disorder of kidney and ureter, unspecified: Secondary | ICD-10-CM | POA: Diagnosis not present

## 2017-07-20 DIAGNOSIS — E559 Vitamin D deficiency, unspecified: Secondary | ICD-10-CM | POA: Diagnosis not present

## 2017-07-20 DIAGNOSIS — E118 Type 2 diabetes mellitus with unspecified complications: Secondary | ICD-10-CM | POA: Diagnosis not present

## 2017-07-20 DIAGNOSIS — Z8669 Personal history of other diseases of the nervous system and sense organs: Secondary | ICD-10-CM

## 2017-07-20 DIAGNOSIS — R809 Proteinuria, unspecified: Secondary | ICD-10-CM

## 2017-07-20 DIAGNOSIS — E669 Obesity, unspecified: Secondary | ICD-10-CM

## 2017-07-20 DIAGNOSIS — I10 Essential (primary) hypertension: Secondary | ICD-10-CM | POA: Diagnosis not present

## 2017-07-20 DIAGNOSIS — K5909 Other constipation: Secondary | ICD-10-CM

## 2017-07-20 DIAGNOSIS — M8580 Other specified disorders of bone density and structure, unspecified site: Secondary | ICD-10-CM | POA: Diagnosis not present

## 2017-07-20 DIAGNOSIS — E785 Hyperlipidemia, unspecified: Secondary | ICD-10-CM | POA: Diagnosis not present

## 2017-07-20 NOTE — Telephone Encounter (Signed)
Refer to vascular for ABIs screen

## 2017-07-20 NOTE — Progress Notes (Signed)
Subjective:  Emma [name] is a 62 y.o. female who presents for Chief Complaint  Patient presents with  . Medication Management    refill victoza      Here for med check.    She has hx/o diabetes, hyperlipidemia, constipation, osteopenia, history of migraines, and arthralgias.    Concerns: Feels tired a lot.  Had endoscopy since last visit, was given NTG cream for internal  Hemorrhoids, otherwise says scope was fine.   Elevated BP upon triage.  She notes she is frustrated getting here due to traffic problem on the way here, attributes to BP been elevated.   Diabetes:  not checking glucose.   Ran out of Victoza a few months ago but didn't let us know.  Still taking Metformin 500mg  once daily. No foot concerns other than small corn left small toe laterally.  Hyperlipidemia - taking Crestor 20mg  QHS.  Last visit we changed to Crestor for better efficacy  HTN - compliant with Verapamil 240mg  daily, Lisinopril 20mg  daily.   Last ABI 4-5 years ago.   Constipation - not taking Trulance or Amitiza due to cost.  Lately BMs have been ok.   Osteopenia - taking Vitamin D, exercise is limited.   Left lower leg skin lesion - sees dermatology in 2 weeks.   Sees Dr. Lovenia Shuck for pain.   Wants to be referred to different rheumatologist.    In the past she was followed for years by rheumatology with diagnosis of seronegative RA, but then saw Dr. Charlestine Night few years ago who gave diagnosis of OA and not RA.  She still feels she has RA, wants labs today for inflammatory marker.     Past Medical History:  Diagnosis Date  . Chronic constipation   . Cluster headache syndrome 04/27/13  . Colon polyp 06/11/2011   Most recent 04/23/08, no polyp--mild diverticulosis.  Repeat 10 yrs.  . Depression with anxiety 02/22/2008   Qualifier: Diagnosis of  By: Nolon Rod CMA (AAMA), Robin    . Diverticulosis   . GERD (gastroesophageal reflux disease) 06/11/2011  . Hypertension    Recently diagnosed  . Migraine syndrome    . Osteopenia 11/2016   bone density. plan repeat in 11/2018  . Overweight(278.02) 06/11/2011  . PUD (peptic ulcer disease) 04/23/08   Multiple antrum ulcers; h pylori neg  . Rheumatoid arthritis(714.0)    Current Outpatient Medications on File Prior to Visit  Medication Sig Dispense Refill  . ALPRAZolam (XANAX) 0.5 MG tablet TAKE 1 TABLET AT BEDTIME 60 tablet 0  . AMBULATORY NON FORMULARY MEDICATION Medication Name: *Nitroglycerin 0.125% gel. Apply a pea size amount to your rectum three times daily x 6-8 weeks 30 g 0  . calcium carbonate (CALCIUM 600) 600 MG TABS tablet Take 1 tablet (600 mg total) by mouth 2 (two) times daily with a meal. 180 tablet 3  . carisoprodol (SOMA) 350 MG tablet TAKE 1 TABLET BY MOUTH TWICE A DAY AND AT BEDTIME AS NEEDED FOR MUSCLE SPASMS  0  . Cholecalciferol (VITAMIN D3) 10000 units TABS Take 1 tablet by mouth daily. 90 tablet 3  . etodolac (LODINE) 400 MG tablet TAKE 1 TABLET BY MOUTH TWICE A DAY WITH FOOD AS NEEDED  1  . gabapentin (NEURONTIN) 300 MG capsule TAKE (1) CAPSULE THREE TIMES DAILY. 270 capsule 1  . HYDROcodone-acetaminophen (NORCO) 7.5-325 MG per tablet Take 1 tablet by mouth as needed. Reported on 09/05/2015    . Insulin Pen Needle (PEN NEEDLES) 31G X 8 MM MISC 1  each by Does not apply route daily. 100 each 11  . liraglutide (VICTOZA) 18 MG/3ML SOPN Inject 0.3 mLs (1.8 mg total) into the skin at bedtime. 9 mL 5  . lisinopril (PRINIVIL,ZESTRIL) 20 MG tablet TAKE 1 TABLET (20 MG TOTAL) BY MOUTH DAILY. 90 tablet 3  . metFORMIN (GLUCOPHAGE) 500 MG tablet Take 1 tablet (500 mg total) by mouth daily with breakfast. 90 tablet 0  . omeprazole (PRILOSEC) 40 MG capsule Take 1 capsule (40 mg total) by mouth 2 (two) times daily. 180 capsule 1  . rosuvastatin (CRESTOR) 20 MG tablet Take 1 tablet (20 mg total) by mouth at bedtime. 90 tablet 1  . SUMAtriptan (IMITREX) 100 MG tablet Take 1 tablet (100 mg total) by mouth every 2 (two) hours as needed for migraine. May  repeat in 2 hours if headache persists or recurs. 9 tablet 6  . verapamil (CALAN-SR) 240 MG CR tablet Take 1 tablet (240 mg total) by mouth at bedtime. 90 tablet 3  . lubiprostone (AMITIZA) 24 MCG capsule Take 1 capsule (24 mcg total) by mouth 2 (two) times daily with a meal. (Patient not taking: Reported on 07/20/2017) 60 capsule 3  . Plecanatide (TRULANCE) 3 MG TABS Take 3 mg by mouth daily. (Patient not taking: Reported on 07/20/2017) 90 tablet 3   Current Facility-Administered Medications on File Prior to Visit  Medication Dose Route Frequency Provider Last Rate Last Dose  . 0.9 %  sodium chloride infusion  500 mL Intravenous Once Armbruster, Carlota Raspberry, MD        No other aggravating or relieving factors.    No other c/o.  The following portions of the patient's history were reviewed and updated as appropriate: allergies, current medications, past family history, past medical history, past social history, past surgical history and problem list.  ROS Otherwise as in subjective above   Objective: BP (!) 148/84   Pulse 62   Temp 97.7 F (36.5 C) (Oral)   Ht 5' 6.5" (1.689 m)   Wt 240 lb (108.9 kg)   SpO2 97%   BMI 38.16 kg/m   BP Readings from Last 3 Encounters:  07/20/17 (!) 148/84  06/02/17 132/78  05/23/17 (!) 113/47   Wt Readings from Last 3 Encounters:  07/20/17 240 lb (108.9 kg)  06/02/17 241 lb (109.3 kg)  05/23/17 239 lb (108.4 kg)   General appearance: alert, no distress, well developed, well nourished Heart: RRR, normal S1, S2, no murmurs Lungs: CTA bilaterally, no wheezes, rhonchi, or rales Pulses: 2+ radial pulses, 1+ pedal pulses, normal cap refill Ext: mild 1+ nonpitting edema of bilat ankles Skin:   Diabetic Foot Exam - Simple   Simple Foot Form Diabetic Foot exam was performed with the following findings:  Yes 07/20/2017  2:15 PM  Visual Inspection See comments:  Yes Sensation Testing See comments:  Yes Pulse Check See comments:   Yes Comments 1+pedal pulses, small callus of left lateral distal 5th toe, decreased monofilament of bilat great toes, otherwise good sensation      Adult ECG Report  Indication: HTN   Rate: 58 bpm  Rhythm: sinus bradycardia  QRS Axis: 19 degrees  PR Interval: 133ms  QRS Duration: 26ms  QTc: 48ms  Conduction Disturbances: none  Other Abnormalities: none  Patient's cardiac risk factors are: diabetes mellitus, dyslipidemia, hypertension and obesity (BMI >= 30 kg/m2).  EKG comparison: 2015  Narrative Interpretation: no acute changes    Assessment: Encounter Diagnoses  Name Primary?  . Essential hypertension  Yes  . Diabetes mellitus with complication ([place])   . Osteopenia, unspecified location   . Chronic constipation   . High risk medication use   . Renal insufficiency   . Polyarthralgia   . Vitamin D deficiency   . Hyperlipidemia, unspecified hyperlipidemia type   . Decreased pulses in feet   . Chronic venous insufficiency   . Skin lesion   . History of migraine headaches   . Obesity, unspecified classification, unspecified obesity type, unspecified whether serious comorbidity present   . Microalbuminuria      Plan: Hypertension-EKG reviewed, labs today.  Continue current therapy  Diabetes- labs today, discussed daily foot care, glucose monitoring, diet and exercise recommendations, needs to increase exercise, and advised to let me know if she runs out of medication.  For some reason she had run out of Victoza but did not let me know.  Osteopenia- reiterated need for weightbearing and aerobic exercise, continue vitamin D and calcium  Chronic constipation- unable to afford Amitiza or Trulance.  She just recently saw GI.  No current complaint  Renal insufficiency-lab today  Polyarthralgia- she continues to complain diagnosis of rheumatoid arthritis although that diagnosis has been disputed by her evaluation a year or 2 ago.  However she was followed by rheumatologist  for years with a diagnosis of RA.  She wants referral to another rheumatologist.  Follow-up pending labs  Hyperlipidemia-change to Crestor last visit.  Repeat lipids today.  Continue Crestor.  Skin lesion-follow-up with dermatology in 2 weeks as planned  Microalbuminuria-continue ACE inhibitor  Vitamin D deficiency-continue vitamin D therapy, continue to get seafood in diet  Decreased pulses in feet-referral for ABIs  Obesity-counseled on the need for increased exercise, healthy diet  Laurieann was seen today for medication management.  Diagnoses and all orders for this visit:  Essential hypertension -     Lipid panel -     Comprehensive metabolic panel -     EKG 59-DJTT -     VAS [place] ABI WITH/WO TBI; Future  Diabetes mellitus with complication (HCC) -     Hemoglobin A1c -     HM DIABETES FOOT EXAM -     HM DIABETES EYE EXAM -     VAS [place] ABI WITH/WO TBI; Future  Osteopenia, unspecified location  Chronic constipation  High risk medication use  Renal insufficiency  Polyarthralgia -     Sedimentation rate  Vitamin D deficiency  Hyperlipidemia, unspecified hyperlipidemia type  Decreased pulses in feet -     VAS [place] ABI WITH/WO TBI; Future  Chronic venous insufficiency  Skin lesion  History of migraine headaches  Obesity, unspecified classification, unspecified obesity type, unspecified whether serious comorbidity present  Microalbuminuria  refills upon lab results review  Follow up: pending labs

## 2017-07-21 ENCOUNTER — Telehealth: Payer: Self-pay | Admitting: Medical

## 2017-07-21 ENCOUNTER — Other Ambulatory Visit: Payer: Self-pay | Admitting: Medical

## 2017-07-21 LAB — COMPREHENSIVE METABOLIC PANEL
ALT: 17 IU/L (ref 0–32)
AST: 15 IU/L (ref 0–40)
Albumin/Globulin Ratio: 1.6 (ref 1.2–2.2)
Albumin: 4.2 g/dL (ref 3.6–4.8)
Alkaline Phosphatase: 58 IU/L (ref 39–117)
BUN/Creatinine Ratio: 20 (ref 12–28)
BUN: 21 mg/dL (ref 8–27)
Bilirubin Total: <0.2 mg/dL (ref 0.0–1.2)
CO2: 19 mmol/L — ABNORMAL LOW (ref 20–29)
Calcium: 9.1 mg/dL (ref 8.7–10.3)
Chloride: 105 mmol/L (ref 96–106)
Creatinine, Ser: 1.05 mg/dL — ABNORMAL HIGH (ref 0.57–1.00)
GFR calc Af Amer: 65 mL/min/{1.73_m2} (ref >59)
GFR calc non Af Amer: 57 mL/min/{1.73_m2} — ABNORMAL LOW (ref >59)
Globulin, Total: 2.7 g/dL (ref 1.5–4.5)
Glucose: 91 mg/dL (ref 65–99)
Potassium: 4.8 mmol/L (ref 3.5–5.2)
Sodium: 143 mmol/L (ref 134–144)
Total Protein: 6.9 g/dL (ref 6.0–8.5)

## 2017-07-21 LAB — HEMOGLOBIN A1C
Est. average glucose Bld gHb Est-mCnc: 143 mg/dL
Hgb A1c MFr Bld: 6.6 % — ABNORMAL HIGH (ref 4.8–5.6)

## 2017-07-21 LAB — LIPID PANEL
Chol/HDL Ratio: 2.8 ratio (ref 0.0–4.4)
Cholesterol, Total: 122 mg/dL (ref 100–199)
HDL: 44 mg/dL (ref >39)
LDL Calculated: 47 mg/dL (ref 0–99)
Triglycerides: 153 mg/dL — ABNORMAL HIGH (ref 0–149)
VLDL Cholesterol Cal: 31 mg/dL (ref 5–40)

## 2017-07-21 LAB — SEDIMENTATION RATE: Sed Rate: 36 mm/hr (ref 0–40)

## 2017-07-21 MED ORDER — METFORMIN HCL 500 MG PO TABS: 500.0000 mg | ORAL_TABLET | Freq: Every day | ORAL | 1 refills | Status: DC

## 2017-07-21 MED ORDER — GABAPENTIN 300 MG PO CAPS: ORAL_CAPSULE | ORAL | 1 refills | Status: DC

## 2017-07-21 MED ORDER — PEN NEEDLES 31G X 8 MM MISC: 1.0000 | Freq: Every day | 11 refills | Status: DC

## 2017-07-21 MED ORDER — LIRAGLUTIDE 18 MG/3ML ~~LOC~~ SOPN: 1.8000 mg | PEN_INJECTOR | Freq: Every day | SUBCUTANEOUS | 5 refills | Status: DC

## 2017-07-21 MED ORDER — ROSUVASTATIN CALCIUM 20 MG PO TABS
20.0000 mg | ORAL_TABLET | Freq: Every day | ORAL | 1 refills | Status: DC
Start: 2017-07-21 — End: 2018-01-07

## 2017-07-21 NOTE — Telephone Encounter (Signed)
Rheumatology consult

## 2017-07-22 NOTE — Telephone Encounter (Signed)
pls print me last chart notes in chart from Dr. Charlestine Night and last note from any other rheumatologist or orthopedist in chart.   Last OV notes only.

## 2017-07-25 NOTE — Telephone Encounter (Signed)
Please specific as I do not see any notes from ortho or [facility] in epic chart.

## 2017-07-27 ENCOUNTER — Encounter: Payer: Self-pay | Admitting: Medical

## 2017-07-27 ENCOUNTER — Other Ambulatory Visit: Payer: Self-pay | Admitting: Medical

## 2017-07-27 DIAGNOSIS — L821 Other seborrheic keratosis: Secondary | ICD-10-CM | POA: Diagnosis not present

## 2017-07-27 DIAGNOSIS — D0472 Carcinoma in situ of skin of left lower limb, including hip: Secondary | ICD-10-CM | POA: Diagnosis not present

## 2017-07-27 DIAGNOSIS — D485 Neoplasm of uncertain behavior of skin: Secondary | ICD-10-CM | POA: Diagnosis not present

## 2017-07-27 DIAGNOSIS — D225 Melanocytic nevi of trunk: Secondary | ICD-10-CM | POA: Diagnosis not present

## 2017-07-27 DIAGNOSIS — L565 Disseminated superficial actinic porokeratosis (DSAP): Secondary | ICD-10-CM | POA: Diagnosis not present

## 2017-07-27 NOTE — Telephone Encounter (Signed)
Pt stated she would be happy to just stay with you if you can giver her something to help with the pain. She's taking something for inflammation but she has flare ups. Please advise.

## 2017-07-27 NOTE — Telephone Encounter (Signed)
lmom asking patient to call back for providers note.

## 2017-07-27 NOTE — Progress Notes (Signed)
error 

## 2017-07-27 NOTE — Telephone Encounter (Signed)
She had requested a referral to a new rheumatologist.   Keep in mind her xrays most recent in chart and labs haven't really pointed towards rheumatid disease but more of a degenerate arthritis and fibromyalgia picture.     If she still wants to see one, I'll refer.     I know she saw a rheumatologist from 2013-2016 as I have those records.   She also saw Dr. Charlestine Night in 2017.  Who else has she seen for joint pains and arthritis in [place]?    If she wants to move forward with the referral, then send the letter I printed, but have her come and pickup the stack of records to take with her as we are not allowed to fax or forward records that we didn't generate such as prior xray and notes from the other specialists.

## 2017-07-29 NOTE — Telephone Encounter (Signed)
Send to [facility]umatology

## 2017-08-08 NOTE — Telephone Encounter (Signed)
Pt has been referred to rheumatology.

## 2017-08-08 NOTE — Telephone Encounter (Signed)
Pt has ABI appt scheduled for 08/17/17 at [facility]. Patient has been advised of her appointment.

## 2017-08-11 DIAGNOSIS — D0472 Carcinoma in situ of skin of left lower limb, including hip: Secondary | ICD-10-CM | POA: Diagnosis not present

## 2017-08-11 DIAGNOSIS — C44719 Basal cell carcinoma of skin of left lower limb, including hip: Secondary | ICD-10-CM | POA: Diagnosis not present

## 2017-08-16 HISTORY — PX: SKIN BIOPSY: SHX1

## 2017-08-17 ENCOUNTER — Ambulatory Visit (HOSPITAL_COMMUNITY)
Admission: RE | Admit: 2017-08-17 | Discharge: 2017-08-17 | Disposition: A | Discharge status: Home / Self Care | Payer: Medicare HMO | Source: Ambulatory Visit | Attending: Medical | Admitting: Medical

## 2017-08-17 DIAGNOSIS — E118 Type 2 diabetes mellitus with unspecified complications: Secondary | ICD-10-CM | POA: Diagnosis not present

## 2017-08-17 DIAGNOSIS — R0989 Other specified symptoms and signs involving the circulatory and respiratory systems: Secondary | ICD-10-CM

## 2017-08-17 DIAGNOSIS — I10 Essential (primary) hypertension: Secondary | ICD-10-CM | POA: Diagnosis not present

## 2017-08-19 ENCOUNTER — Encounter: Payer: Self-pay | Admitting: Medical

## 2017-08-22 DIAGNOSIS — Z85828 Personal history of other malignant neoplasm of skin: Secondary | ICD-10-CM | POA: Diagnosis not present

## 2017-08-22 DIAGNOSIS — C44719 Basal cell carcinoma of skin of left lower limb, including hip: Secondary | ICD-10-CM | POA: Diagnosis not present

## 2017-08-27 ENCOUNTER — Other Ambulatory Visit: Payer: Self-pay | Admitting: Family Medicine

## 2017-08-29 NOTE — Telephone Encounter (Signed)
Your patient 

## 2017-08-29 NOTE — Telephone Encounter (Signed)
[facility] is requesting to fill pt xanax. Please advise [facility] 

## 2017-09-10 ENCOUNTER — Other Ambulatory Visit: Payer: Self-pay

## 2017-09-10 ENCOUNTER — Emergency Department (HOSPITAL_COMMUNITY)
Admission: EM | Admit: 2017-09-10 | Discharge: 2017-09-10 | Disposition: A | Discharge status: Home / Self Care | Payer: Medicare HMO | Attending: Emergency Medicine | Admitting: Emergency Medicine

## 2017-09-10 ENCOUNTER — Encounter (HOSPITAL_COMMUNITY): Payer: Self-pay | Admitting: *Deleted

## 2017-09-10 DIAGNOSIS — Y939 Activity, unspecified: Secondary | ICD-10-CM | POA: Diagnosis not present

## 2017-09-10 DIAGNOSIS — W57XXXA Bitten or stung by nonvenomous insect and other nonvenomous arthropods, initial encounter: Secondary | ICD-10-CM | POA: Diagnosis not present

## 2017-09-10 DIAGNOSIS — S80862A Insect bite (nonvenomous), left lower leg, initial encounter: Secondary | ICD-10-CM | POA: Insufficient documentation

## 2017-09-10 DIAGNOSIS — T63481A Toxic effect of venom of other arthropod, accidental (unintentional), initial encounter: Secondary | ICD-10-CM | POA: Diagnosis not present

## 2017-09-10 DIAGNOSIS — Y999 Unspecified external cause status: Secondary | ICD-10-CM | POA: Insufficient documentation

## 2017-09-10 DIAGNOSIS — Z79899 Other long term (current) drug therapy: Secondary | ICD-10-CM | POA: Insufficient documentation

## 2017-09-10 DIAGNOSIS — E119 Type 2 diabetes mellitus without complications: Secondary | ICD-10-CM | POA: Diagnosis not present

## 2017-09-10 DIAGNOSIS — T7840XA Allergy, unspecified, initial encounter: Secondary | ICD-10-CM | POA: Insufficient documentation

## 2017-09-10 DIAGNOSIS — Y929 Unspecified place or not applicable: Secondary | ICD-10-CM | POA: Insufficient documentation

## 2017-09-10 DIAGNOSIS — R609 Edema, unspecified: Secondary | ICD-10-CM | POA: Diagnosis not present

## 2017-09-10 DIAGNOSIS — Z7984 Long term (current) use of oral hypoglycemic drugs: Secondary | ICD-10-CM | POA: Insufficient documentation

## 2017-09-10 DIAGNOSIS — I491 Atrial premature depolarization: Secondary | ICD-10-CM | POA: Diagnosis not present

## 2017-09-10 DIAGNOSIS — R0689 Other abnormalities of breathing: Secondary | ICD-10-CM | POA: Diagnosis not present

## 2017-09-10 DIAGNOSIS — I10 Essential (primary) hypertension: Secondary | ICD-10-CM | POA: Diagnosis not present

## 2017-09-10 MED ORDER — FAMOTIDINE 20 MG PO TABS: 20.0000 mg | ORAL_TABLET | Freq: Two times a day (BID) | ORAL | 0 refills | Status: DC

## 2017-09-10 MED ORDER — METHYLPREDNISOLONE SODIUM SUCC 125 MG IJ SOLR
125.0000 mg | Freq: Once | INTRAMUSCULAR | Status: AC
  Administered 2017-09-10: 125 mg via INTRAVENOUS
  Filled 2017-09-10: qty 2

## 2017-09-10 MED ORDER — PREDNISONE 20 MG PO TABS: 40.0000 mg | ORAL_TABLET | Freq: Every day | ORAL | 0 refills | Status: DC

## 2017-09-10 MED ORDER — DIPHENHYDRAMINE HCL 25 MG PO CAPS
50.0000 mg | ORAL_CAPSULE | Freq: Once | ORAL | Status: AC
Start: 2017-09-10 — End: 2017-09-10
  Administered 2017-09-10: 50 mg via ORAL
  Filled 2017-09-10: qty 2

## 2017-09-10 MED ORDER — FAMOTIDINE IN NACL 20-0.9 MG/50ML-% IV SOLN
20.0000 mg | INTRAVENOUS | Status: AC
  Administered 2017-09-10: 20 mg via INTRAVENOUS
  Filled 2017-09-10: qty 50

## 2017-09-10 NOTE — ED Provider Notes (Signed)
[facility] EMERGENCY DEPARTMENT Provider Note   CSN: [id number] Arrival date & time: 09/10/17  2152     History   Chief Complaint Chief Complaint  Patient presents with  . Allergic Reaction    HPI Emma [name] is a 62 y.o. female.  HPI  62 year old female, presents with an allergic reaction after she was stung by some type of insect on the back of her left calf just prior to arrival.  This sting was acute in onset, it is been persistent, worse with palpation, associated with stinging and itching feeling as well as associated with swelling of her tongue at least subjectively.  She took Benadryl with some improvement, called the paramedics after her neighbor who is a first responder such she needed to come to the hospital.  The patient denies any difficulty with shortness of breath wheezing changes in vision lightheadedness and denies passing out.  She has had a severe reaction in the past which required an emergency department visit with an epinephrine pen which she never had to use.  She no longer has it as it was passed its date, she thinks that she was allergic to bee stings which is what caused her symptoms in the past.  No nausea, no vomiting, no diarrhea.  Past Medical History:  Diagnosis Date  . Chronic constipation   . Cluster headache syndrome 04/27/13  . Colon polyp 06/11/2011   Most recent 04/23/08, no polyp--mild diverticulosis.  Repeat 10 yrs.  . Depression with anxiety 02/22/2008   Qualifier: Diagnosis of  By: Nolon Rod CMA (AAMA), Robin    . Diverticulosis   . GERD (gastroesophageal reflux disease) 06/11/2011  . Hypertension    Recently diagnosed  . Migraine syndrome   . Osteopenia 11/2016   bone density. plan repeat in 11/2018  . Overweight(278.02) 06/11/2011  . PUD (peptic ulcer disease) 04/23/08   Multiple antrum ulcers; h pylori neg  . Rheumatoid arthritis(714.0)     Patient Active Problem List   Diagnosis Date Noted  . Hyperlipidemia 07/20/2017  .  Osteopenia 12/16/2016  . Vaccine counseling 12/16/2016  . Vitamin D deficiency 12/16/2016  . Microalbuminuria 05/19/2016  . Renal insufficiency 05/19/2016  . Post-menopausal 05/19/2016  . Estrogen deficiency 05/19/2016  . Skin lesion 05/19/2016  . Polyarthralgia 12/22/2015  . Need for prophylactic vaccination against Streptococcus pneumoniae (pneumococcus) 12/22/2015  . Need for prophylactic vaccination and inoculation against influenza 12/22/2015  . Low bone mass 03/30/2015  . Essential hypertension 03/27/2015  . High risk medication use 03/27/2015  . Insomnia 03/27/2015  . Chronic constipation 03/27/2015  . Edema 03/27/2015  . History of migraine headaches 03/27/2015  . Obesity 03/27/2015  . Chronic cluster headache, not intractable 03/27/2015  . Chronic venous insufficiency 03/27/2015  . Diabetes mellitus with complication ([place]) 78/93/8101  . Gastroesophageal reflux disease without esophagitis 03/27/2015  . IBS (irritable bowel syndrome) 03/27/2015  . Anxiety and depression 03/27/2015  . Cluster headache syndrome 08/31/2013  . Colon polyp 06/11/2011    Past Surgical History:  Procedure Laterality Date  . ABDOMINAL HYSTERECTOMY    . APPENDECTOMY    . BACK SURGERY     3 times  . CARDIOVASCULAR STRESS TEST  06/22/11   Normal; EF 65%  . CHOLECYSTECTOMY    . COLONOSCOPY  04/23/2008   mild diverticulosis, internal hemorrhoids; Dr. Erskine Emery  . LIVER BIOPSY  03/2007   Chronic active hepatitis w/fibrosis--related to hx of methotrexate treatment.  Marland Kitchen SKIN BIOPSY  08/16/2017   superficial basal cell carcinoma  left inferior medial anterior tibia      OB History   None      Home Medications    Prior to Admission medications   Medication Sig Start Date End Date Taking? Authorizing Provider  acetaminophen (TYLENOL) 500 MG tablet Take 1,500 mg by mouth once as needed for mild pain or moderate pain.   Yes [provider]  ALPRAZolam Duanne Moron) 0.5 MG tablet TAKE 1  TABLET BY MOUTH EVERYDAY AT BEDTIME 08/29/17  Yes Tysinger, Camelia Eng, PA-C  AMBULATORY NON FORMULARY MEDICATION Medication Name: *Nitroglycerin 0.125% gel. Apply a pea size amount to your rectum three times daily x 6-8 weeks Patient taking differently: Place 1 application rectally See admin instructions. Medication Name: *Nitroglycerin 0.125% gel. Apply a pea size amount to your rectum three times daily x 6-8 weeks 05/23/17  Yes Armbruster, Carlota Raspberry, MD  BIOTIN PO Take 1 capsule by mouth daily.   Yes [provider]  calcium carbonate (CALCIUM 600) 600 MG TABS tablet Take 1 tablet (600 mg total) by mouth 2 (two) times daily with a meal. 12/17/16  Yes Tysinger, Camelia Eng, PA-C  carisoprodol (SOMA) 350 MG tablet TAKE 1 TABLET BY MOUTH TWICE A DAY AND AT BEDTIME AS NEEDED FOR MUSCLE SPASMS 05/23/17  Yes [provider]  Cholecalciferol (VITAMIN D3) 10000 units TABS Take 1 tablet by mouth daily. 12/17/16  Yes Tysinger, Camelia Eng, PA-C  diphenhydrAMINE (BENADRYL) 25 mg capsule Take 50 mg by mouth once as needed for itching or allergies.   Yes [provider]  etodolac (LODINE) 400 MG tablet TAKE 1 TABLET BY MOUTH TWICE A DAY WITH FOOD AS NEEDED FOR PAIN 03/19/17  Yes [provider]  gabapentin (NEURONTIN) 300 MG capsule TAKE (1) CAPSULE THREE TIMES DAILY. Patient taking differently: Take 300 mg by mouth 3 (three) times daily as needed.  07/21/17  Yes Tysinger, Camelia Eng, PA-C  HYDROcodone-acetaminophen (NORCO) 7.5-325 MG per tablet Take 1 tablet by mouth daily as needed for moderate pain. Reported on 09/05/2015 05/03/11  Yes [provider]  liraglutide (VICTOZA) 18 MG/3ML SOPN Inject 0.3 mLs (1.8 mg total) into the skin at bedtime. 07/21/17  Yes Tysinger, Camelia Eng, PA-C  lisinopril (PRINIVIL,ZESTRIL) 20 MG tablet TAKE 1 TABLET (20 MG TOTAL) BY MOUTH DAILY. 07/06/17  Yes Denita Lung, MD  metFORMIN (GLUCOPHAGE) 500 MG tablet Take 1 tablet (500 mg total) by mouth daily with  breakfast. 07/21/17  Yes Tysinger, Camelia Eng, PA-C  omeprazole (PRILOSEC) 40 MG capsule Take 1 capsule (40 mg total) by mouth 2 (two) times daily. Patient taking differently: Take 40 mg by mouth 2 (two) times daily as needed (FOR ACID REFLUX).  04/20/17  Yes Armbruster, Carlota Raspberry, MD  rosuvastatin (CRESTOR) 20 MG tablet Take 1 tablet (20 mg total) by mouth at bedtime. 07/21/17 07/21/18 Yes Tysinger, Camelia Eng, PA-C  SUMAtriptan (IMITREX) 100 MG tablet Take 1 tablet (100 mg total) by mouth every 2 (two) hours as needed for migraine. May repeat in 2 hours if headache persists or recurs. 06/02/17  Yes Dennie Bible, NP  verapamil (CALAN-SR) 240 MG CR tablet Take 1 tablet (240 mg total) by mouth at bedtime. 12/17/16  Yes Tysinger, Camelia Eng, PA-C  famotidine (PEPCID) 20 MG tablet Take 1 tablet (20 mg total) by mouth 2 (two) times daily. 09/10/17   Noemi Chapel, MD  Insulin Pen Needle (PEN NEEDLES) 31G X 8 MM MISC 1 each by Does not apply route daily. 07/21/17   Tysinger,  Camelia Eng, PA-C  predniSONE (DELTASONE) 20 MG tablet Take 2 tablets (40 mg total) by mouth daily. 09/10/17   Noemi Chapel, MD    Family History Family History  Problem Relation Age of Onset  . Diabetes Mother   . Stroke Mother   . Arthritis Father   . Hypertension Father   . Atrial fibrillation Father   . Alcohol abuse Sister   . Cancer Sister        mesothelioma  . Heart disease Sister        cardiomegaly  . Hypertension Sister   . Pulmonary embolism Sister   . Hypertension Brother   . Polycystic ovary syndrome Daughter   . Heart disease Maternal Grandmother   . Heart disease Maternal Grandfather   . Colon cancer Paternal Uncle   . Esophageal cancer Neg Hx   . Stomach cancer Neg Hx     Social History Social History   Tobacco Use  . Smoking status: Never Smoker  . Smokeless tobacco: Never Used  Substance Use Topics  . Alcohol use: Yes    Comment: Occasional  . Drug use: No     Allergies   Bee venom   Review of  Systems Review of Systems  All other systems reviewed and are negative.    Physical Exam Updated Vital Signs BP (!) 144/98   Pulse (!) 56   Temp 97.9 F (36.6 C) (Oral)   Resp 18   Ht 5\' 7"  (1.702 m)   Wt 106.6 kg (235 lb)   SpO2 97%   BMI 36.81 kg/m   Physical Exam  Constitutional: She appears well-developed and well-nourished. No distress.  HENT:  Head: Normocephalic and atraumatic.  Mouth/Throat: Oropharynx is clear and moist. No oropharyngeal exudate.  Eyes: Pupils are equal, round, and reactive to light. Conjunctivae and EOM are normal. Right eye exhibits no discharge. Left eye exhibits no discharge. No scleral icterus.  Neck: Normal range of motion. Neck supple. No JVD present. No thyromegaly present.  Cardiovascular: Normal rate, regular rhythm, normal heart sounds and intact distal pulses. Exam reveals no gallop and no friction rub.  No murmur heard. Pulmonary/Chest: Effort normal and breath sounds normal. No respiratory distress. She has no wheezes. She has no rales.  Abdominal: Soft. Bowel sounds are normal. She exhibits no distension and no mass. There is no tenderness.  Musculoskeletal: Normal range of motion. She exhibits no edema or tenderness.  Lymphadenopathy:    She has no cervical adenopathy.  Neurological: She is alert. Coordination normal.  Skin: Skin is warm and dry. Rash noted. No erythema.  6 cm in diameter erythematous rash, minimally tender to the back of the left calf proximal to mid calf.  Psychiatric: She has a normal mood and affect. Her behavior is normal.  Nursing note and vitals reviewed.    ED Treatments / Results  Labs (all labs ordered are listed, but only abnormal results are displayed) Labs Reviewed - No data to display  EKG None  Radiology No results found.  Procedures Procedures (including critical care time)  Medications Ordered in ED Medications  diphenhydrAMINE (BENADRYL) capsule 50 mg (has no administration in time  range)  methylPREDNISolone sodium succinate (SOLU-MEDROL) 125 mg/2 mL injection 125 mg (125 mg Intravenous Given 09/10/17 2241)  famotidine (PEPCID) IVPB 20 mg premix (0 mg Intravenous Stopped 09/10/17 2315)     Initial Impression / Assessment and Plan / ED Course  I have reviewed the triage vital signs and the nursing notes.  Pertinent labs & imaging results that were available during my care of the patient were reviewed by me and considered in my medical decision making (see chart for details).    The patient is well-appearing, unfortunately she is a diabetic and will need steroids given her significant history for severe reaction to insect stings.  She would does not need epinephrine at this time however she will need steroids, she has been informed that this will make her hyperglycemic and she is agreeable.  Will give Pepcid, she will need EpiPen refills for home.  She will need a short observation in the ER  Final Clinical Impressions(s) / ED Diagnoses   Final diagnoses:  Allergic reaction, initial encounter  Insect stings, accidental or unintentional, initial encounter    ED Discharge Orders        Ordered    famotidine (PEPCID) 20 MG tablet  2 times daily     09/10/17 2330    predniSONE (DELTASONE) 20 MG tablet  Daily     09/10/17 2330       Noemi Chapel, MD 09/10/17 2330

## 2017-09-10 NOTE — ED Triage Notes (Signed)
Patient out watering plants tonight stung by a bee.  No noted airway swelling or hives, only on back of left leg.  50mg  Benadryl take by patient before EMS arrival.  20 G in left hand with 544ml bolus Normal saline.  4mg  Zofran IV given for nausea.  Patient reported feeling like "passing out " to EMS.  CBG 122, patient is diabetic.

## 2017-09-10 NOTE — Discharge Instructions (Signed)
The insect bite has caused an allergic reaction - it seems to have improved, your blood pressure and heart rate are reassuring.  Please take the following medications over the next several days:   Prednisone 40mg  by mouth daily for 5 days Pepcid 20mg  by mouth twice daily for 1 week Benadryl 25mg  by mouth every 6 hours as needed for itching.  It may help to use cold compresses on the bite wound - this will be red and tender and itchy for several days but should not be associated with fevers or increased swelling of the leg after 72 hours - if that occurs, seek a medical exam immediately.

## 2017-09-26 DIAGNOSIS — G8929 Other chronic pain: Secondary | ICD-10-CM | POA: Diagnosis not present

## 2017-09-26 DIAGNOSIS — E669 Obesity, unspecified: Secondary | ICD-10-CM | POA: Diagnosis not present

## 2017-09-26 DIAGNOSIS — M7989 Other specified soft tissue disorders: Secondary | ICD-10-CM | POA: Diagnosis not present

## 2017-09-26 DIAGNOSIS — M2550 Pain in unspecified joint: Secondary | ICD-10-CM | POA: Diagnosis not present

## 2017-09-26 DIAGNOSIS — Z6836 Body mass index (BMI) 36.0-36.9, adult: Secondary | ICD-10-CM | POA: Diagnosis not present

## 2017-10-04 ENCOUNTER — Other Ambulatory Visit: Payer: Self-pay | Admitting: Medical

## 2017-11-14 ENCOUNTER — Other Ambulatory Visit: Payer: Self-pay | Admitting: Medical

## 2017-11-14 NOTE — Telephone Encounter (Signed)
Is this ok to refill?  

## 2017-11-24 DIAGNOSIS — E669 Obesity, unspecified: Secondary | ICD-10-CM | POA: Diagnosis not present

## 2017-11-24 DIAGNOSIS — Z6836 Body mass index (BMI) 36.0-36.9, adult: Secondary | ICD-10-CM | POA: Diagnosis not present

## 2017-11-24 DIAGNOSIS — M154 Erosive (osteo)arthritis: Secondary | ICD-10-CM | POA: Diagnosis not present

## 2017-11-24 DIAGNOSIS — M2550 Pain in unspecified joint: Secondary | ICD-10-CM | POA: Diagnosis not present

## 2017-11-24 DIAGNOSIS — M7989 Other specified soft tissue disorders: Secondary | ICD-10-CM | POA: Diagnosis not present

## 2017-11-24 DIAGNOSIS — G8929 Other chronic pain: Secondary | ICD-10-CM | POA: Diagnosis not present

## 2017-12-05 NOTE — Progress Notes (Signed)
[facility]  PATIENT: Emma [name] DOB: 1955-09-28   REASON FOR VISIT: Follow-up for  headaches  HISTORY FROM: Patient    HISTORY OF PRESENT ILLNESS: 62 year old Caucasian female who previously seen Dr. Leonie Man approximately 3 years ago for left-sided head pain who was lost to followup at that time with persistent left-sided head pain which has worsened in the last year. She taking Topamax 50 mg twice a day without relief. She reports these headaches are sharp , shooting and start behind left eye and shoot to left sideof face and neck may last up to 2 days where she remains in the bed. She has to be in the dark for relief. During that time her mother died.  November 02, 2011 (JM): Returns for follow up visit since 07/27/11. Reports she is having headaches 3 days per week, was 2 days per week. She has been taking Topamax 100mg  BID, tolerating well but no relief. She has also been taking gabapentin 100mg  daily, denies any side effects she was concerned about having seizures and not being able to stop the medication. She can not relate any triggers to her headaches. Today she has a severe headache on the left side that started on Sunday and has not had any relief. Reports photophobia and intermittent nausea. She has tried Maxalt in the past but stopped secondary to cost, this was prior to going generic.  She also has history of degenerative disc disease.  04/27/13 (LL): She comes in for worsening headaches. Last here in mid 2013. She has continued to take Gabapentin since that time. But sees no benefit. Headaches are always left sided, temporal and parietal, vascular type which regularly wake her up at 4 am. They are accompanied with left eye injection, without tearing. She often has to stay in dark room most of the day with headache, and nausea often is present. She endorses poor sleep, even with alprazolam, soma, mirapex and lunesta nightly.  08/31/13 (LL): [name] comes in for follow up  for cluster headaches.  She has been doing much better on Verapamil as preventative.  She is sleeping better as well since starting this which is helping as well.  She has not had any severe headaches in 3 months.  She has not had to take Rizatriptan in over a month.  Since last here her sister died suddenly from a PE. Her husband is also seen in this practice for mild cognitive impairment. Update 11/19/2014 PS: She returns for follow-up after last visit more than a year ago. She continues to do well and has only occasional cluster headaches mainly involving the left eye and upon awakening from sleep. She takes Maxalt which works quite well. She remains on Calan SR 240 mg at night is tolerating it well without side effects. The headache is described as constant and severe headache 10/10 in severity but not a complaint of nausea vomiting redness or tearing. She does have sensitivity to light and sound and needs to lie down with the headaches. She has no other complaints. UPDATE 10/10/2017CM EmmaClark, 62 year old female returns for follow-up. She was last seen in the office August 2016. She has history of headaches and she has had her current headache for over a week. Her headaches generally wake her up from the left side and are of a vascular type headache. She occasionally has nausea but no vomiting. She does have sensitivity to light and sound , it helps to lay in a dark room Her prescription for Maxalt ran  out in  August. She says it works sometimes, sometimes not She remains on verapamil 240 daily refilled by her primary care. She is also on Neurontin 300 when necessary, she does not take this every day. Diabetes is in fairly good control at present. She returns for reevaluation. She claims she has a lot of stress in her life taking care of elderly husband who has dementia.  UPDATE 01/10/2018CM Emma [name], 62 year old female returns for follow-up with a history of headaches. She has a migraine today with a  pain level of 3. She is requesting infusion. When last seen she was given Depacon IV which was beneficial. She is currently on verapamil 240 as a preventive and her Maxalt was changed at her last visit to Imitrex 50 mg prn. She claims she usually has to use 2 tablets so we can increase the dose to 100 mg today. She also takes gabapentin 3 times daily. She claims her diabetes is apparently fairly good control. She continues to be stressed having to take care of her demented husband's condition continues to be progressive Her headaches can wake her up generally on the left side and are of a vascular type headache. She is occasionally nauseated but no vomiting she does have sensitivity to light and sound. She returns for reevaluation UPDATE 3/14/2019CM Emma [name], 62 year old female returns for follow-up with a history of headaches.  When last seen she was given Depacon infusion with good results.  She is currently on verapamil 240mg   as preventive medication and Maxalt was changed to Imitrex her headaches are tolerable.  She has a lot of stress taking care of a husband with dementia, an aunt with cancer and a father with heart disease that is frequently hospitalized.  She is also on Neurontin for back pain which is been beneficial for her headaches.  Her  Headaches are generally on the left side and are of a vascular type headache.  She can be nauseated, occasionally has sensitivity to light and sound.  She is not exercising at present and was encouraged to get back into her yoga.  She returns for reevaluation UPDATE 9/17/19CM Emma [name], 62 year old female returns for follow-up with history of headaches.  She is currently on verapamil 240 at bedtime Imitrex acutely.  Her husband has dementia and as his dementia has worsened over the last 6 months her headaches have worsened as well she averages 1-2 headaches a week.  Her headaches are generally on the left side she can be nauseated occasionally has sensitivity to  sound and light.  No other interval medical issues.  She also has an aunt that has cancer in the father with heart disease and frequent hospitalizations. REVIEW OF SYSTEMS: Full 14 system review of systems performed and notable only for those listed, all others are neg:  Constitutional: neg  Cardiovascular: neg Ear/Nose/Throat: neg  Skin: neg Eyes: Sensitivity to light Respiratory: neg Gastroitestinal: neg  Hematology/Lymphatic: neg  Endocrine: neg Musculoskeletal: neg Allergy/Immunology: Allergies Neurological: Headache Psychiatric: neg Sleep : neg  ALLERGIES: Allergies  Allergen Reactions  . Bee Venom Anaphylaxis    HOME MEDICATIONS: Outpatient Medications Prior to Visit  Medication Sig Dispense Refill  . acetaminophen (TYLENOL) 500 MG tablet Take 1,500 mg by mouth once as needed for mild pain or moderate pain.    Marland Kitchen ALPRAZolam (XANAX) 0.5 MG tablet TAKE 1 TABLET BY MOUTH EVERYDAY AT BEDTIME 60 tablet 0  . AMBULATORY NON FORMULARY MEDICATION Medication Name: *Nitroglycerin 0.125% gel. Apply a pea size amount  to your rectum three times daily x 6-8 weeks (Patient taking differently: Place 1 application rectally See admin instructions. Medication Name: *Nitroglycerin 0.125% gel. Apply a pea size amount to your rectum three times daily x 6-8 weeks) 30 g 0  . BIOTIN PO Take 1 capsule by mouth daily.    . calcium carbonate (CALCIUM 600) 600 MG TABS tablet Take 1 tablet (600 mg total) by mouth 2 (two) times daily with a meal. 180 tablet 3  . carisoprodol (SOMA) 350 MG tablet TAKE 1 TABLET BY MOUTH TWICE A DAY AND AT BEDTIME AS NEEDED FOR MUSCLE SPASMS  0  . Cholecalciferol (VITAMIN D3) 10000 units TABS Take 1 tablet by mouth daily. 90 tablet 3  . diphenhydrAMINE (BENADRYL) 25 mg capsule Take 50 mg by mouth once as needed for itching or allergies.    Marland Kitchen etodolac (LODINE) 400 MG tablet TAKE 1 TABLET BY MOUTH TWICE A DAY WITH FOOD AS NEEDED FOR PAIN  1  . famotidine (PEPCID) 20 MG tablet  Take 1 tablet (20 mg total) by mouth 2 (two) times daily. 30 tablet 0  . gabapentin (NEURONTIN) 300 MG capsule TAKE (1) CAPSULE THREE TIMES DAILY. (Patient taking differently: Take 300 mg by mouth 3 (three) times daily as needed. ) 270 capsule 1  . hydroxychloroquine (PLAQUENIL) 200 MG tablet Take 400 mg by mouth daily.  2  . Insulin Pen Needle (PEN NEEDLES) 31G X 8 MM MISC 1 each by Does not apply route daily. 100 each 11  . liraglutide (VICTOZA) 18 MG/3ML SOPN Inject 0.3 mLs (1.8 mg total) into the skin at bedtime. 9 mL 5  . lisinopril (PRINIVIL,ZESTRIL) 20 MG tablet TAKE 1 TABLET (20 MG TOTAL) BY MOUTH DAILY. 90 tablet 3  . metFORMIN (GLUCOPHAGE) 500 MG tablet Take 1 tablet (500 mg total) by mouth daily with breakfast. 90 tablet 1  . omeprazole (PRILOSEC) 40 MG capsule Take 1 capsule (40 mg total) by mouth 2 (two) times daily. (Patient taking differently: Take 40 mg by mouth 2 (two) times daily as needed (FOR ACID REFLUX). ) 180 capsule 1  . rosuvastatin (CRESTOR) 20 MG tablet Take 1 tablet (20 mg total) by mouth at bedtime. 90 tablet 1  . SUMAtriptan (IMITREX) 100 MG tablet Take 1 tablet (100 mg total) by mouth every 2 (two) hours as needed for migraine. May repeat in 2 hours if headache persists or recurs. 9 tablet 6  . verapamil (CALAN-SR) 240 MG CR tablet Take 1 tablet (240 mg total) by mouth at bedtime. 90 tablet 3  . omeprazole (PRILOSEC) 40 MG capsule TAKE 1 CAPSULE BY MOUTH EVERY DAY 90 capsule 0  . HYDROcodone-acetaminophen (NORCO) 7.5-325 MG per tablet Take 1 tablet by mouth daily as needed for moderate pain. Reported on 09/05/2015    . predniSONE (DELTASONE) 20 MG tablet Take 2 tablets (40 mg total) by mouth daily. 10 tablet 0   Facility-Administered Medications Prior to Visit  Medication Dose Route Frequency Provider Last Rate Last Dose  . 0.9 %  sodium chloride infusion  500 mL Intravenous Once Armbruster, Carlota Raspberry, MD        PAST MEDICAL HISTORY: Past Medical History:  Diagnosis  Date  . Chronic constipation   . Cluster headache syndrome 04/27/13  . Colon polyp 06/11/2011   Most recent 04/23/08, no polyp--mild diverticulosis.  Repeat 10 yrs.  . Depression with anxiety 02/22/2008   Qualifier: Diagnosis of  By: Nolon Rod CMA (AAMA), Robin    . Diverticulosis   .  GERD (gastroesophageal reflux disease) 06/11/2011  . Hypertension    Recently diagnosed  . Migraine syndrome   . Osteopenia 11/2016   bone density. plan repeat in 11/2018  . Overweight(278.02) 06/11/2011  . PUD (peptic ulcer disease) 04/23/08   Multiple antrum ulcers; h pylori neg  . Rheumatoid arthritis(714.0)     PAST SURGICAL HISTORY: Past Surgical History:  Procedure Laterality Date  . ABDOMINAL HYSTERECTOMY    . APPENDECTOMY    . BACK SURGERY     3 times  . CARDIOVASCULAR STRESS TEST  06/22/11   Normal; EF 65%  . CHOLECYSTECTOMY    . COLONOSCOPY  04/23/2008   mild diverticulosis, internal hemorrhoids; Dr. Erskine Emery  . LIVER BIOPSY  03/2007   Chronic active hepatitis w/fibrosis--related to hx of methotrexate treatment.  Marland Kitchen SKIN BIOPSY  08/16/2017   superficial basal cell carcinoma left inferior medial anterior tibia     FAMILY HISTORY: Family History  Problem Relation Age of Onset  . Diabetes Mother   . Stroke Mother   . Arthritis Father   . Hypertension Father   . Atrial fibrillation Father   . Alcohol abuse Sister   . Cancer Sister        mesothelioma  . Heart disease Sister        cardiomegaly  . Hypertension Sister   . Pulmonary embolism Sister   . Hypertension Brother   . Polycystic ovary syndrome Daughter   . Heart disease Maternal Grandmother   . Heart disease Maternal Grandfather   . Colon cancer Paternal Uncle   . Esophageal cancer Neg Hx   . Stomach cancer Neg Hx     SOCIAL HISTORY: Social History   Socioeconomic History  . Marital status: Married    Spouse name: Not on file  . Number of children: 1  . Years of education: Not on file  . Highest education level:  Not on file  Occupational History  . Not on file  Social Needs  . Financial resource strain: Not on file  . Food insecurity:    Worry: Not on file    Inability: Not on file  . Transportation needs:    Medical: Not on file    Non-medical: Not on file  Tobacco Use  . Smoking status: Never Smoker  . Smokeless tobacco: Never Used  Substance and Sexual Activity  . Alcohol use: Yes    Comment: Occasional  . Drug use: No  . Sexual activity: Yes  Lifestyle  . Physical activity:    Days per week: Not on file    Minutes per session: Not on file  . Stress: Not on file  Relationships  . Social connections:    Talks on phone: Not on file    Gets together: Not on file    Attends religious service: Not on file    Active member of club or organization: Not on file    Attends meetings of clubs or organizations: Not on file    Relationship status: Not on file  . Intimate partner violence:    Fear of current or ex partner: Not on file    Emotionally abused: Not on file    Physically abused: Not on file    Forced sexual activity: Not on file  Other Topics Concern  . Not on file  Social History Narrative  . Not on file     PHYSICAL EXAM  Vitals:   12/06/17 1509  BP: (!) 148/75  Pulse: 67  Weight: 240  lb (108.9 kg)  Height: 5\' 8"  (1.727 m)   Body mass index is 36.49 kg/m.  Generalized: Well developed, Obese female in no acute distress  Head: normocephalic and atraumatic,. Oropharynx benign  Neck: Supple,  Musculoskeletal: No deformity   Neurological examination   Mentation: Alert oriented to time, place, history taking. Attention span and concentration appropriate. Recent and remote memory intact.  Follows all commands speech and language fluent.   Cranial nerve II-XII: Pupils were equal round reactive to light extraocular movements were full, visual field were full on confrontational test. Facial sensation and strength were normal. hearing was intact to finger rubbing  bilaterally. Uvula tongue midline. head turning and shoulder shrug were normal and symmetric.Tongue protrusion into cheek strength was normal. Motor: normal bulk and tone, full strength in the BUE, BLE,  Sensory: normal and symmetric to light touch,  Coordination: finger-nose-finger, heel-to-shin bilaterally, no dysmetria Reflexes: Symmetric upper and lower plantar responses were flexor bilaterally. Gait and Station: Rising up from seated position without assistance, normal stance,  moderate stride, good arm swing, smooth turning, able to perform tiptoe, and heel walking without difficulty. Tandem gait is steady  DIAGNOSTIC DATA (LABS, IMAGING, TESTING) - I reviewed patient records, labs, notes, testing and imaging myself where available.  Lab Results  Component Value Date   WBC 7.0 05/19/2016   HGB 12.9 05/19/2016   HCT 38.5 05/19/2016   MCV 90.4 05/19/2016   PLT 287 05/19/2016      Component Value Date/Time   NA 143 07/20/2017 1406   K 4.8 07/20/2017 1406   CL 105 07/20/2017 1406   CO2 19 (L) 07/20/2017 1406   GLUCOSE 91 07/20/2017 1406   GLUCOSE 86 12/16/2016 1244   BUN 21 07/20/2017 1406   CREATININE 1.05 (H) 07/20/2017 1406   CREATININE 0.95 12/16/2016 1244   CALCIUM 9.1 07/20/2017 1406   PROT 6.9 07/20/2017 1406   ALBUMIN 4.2 07/20/2017 1406   AST 15 07/20/2017 1406   ALT 17 07/20/2017 1406   ALKPHOS 58 07/20/2017 1406   BILITOT <0.2 07/20/2017 1406   GFRNONAA 57 (L) 07/20/2017 1406   GFRAA 65 07/20/2017 1406   Lab Results  Component Value Date   CHOL 122 07/20/2017   HDL 44 07/20/2017   LDLCALC 47 07/20/2017   TRIG 153 (H) 07/20/2017   CHOLHDL 2.8 07/20/2017   Lab Results  Component Value Date   HGBA1C 6.6 (H) 07/20/2017    Lab Results  Component Value Date   TSH 1.76 07/17/2015      ASSESSMENT AND PLAN 62 year old Caucasian female with over 6  years history of left-sided headache  syndrome with neuroulogic exam normal. She has done well since  starting Verapamil, Imitrex added at last visit, works better than Maxalt.. She is currently having 1-2 headaches per week.  She has increased stress due to taking care of her husband with dementia    The patient is a current patient of Dr. Leonie Man  who is out of the office today . This note is sent to the work in doctor.       PLAN: Continue  Imitrex  100mg  when necessary acute migraine Continue verapamil 240 daily Given 240 Emgality today in the office then 120mg  monthly CGRP antagonist Follow-up in 6 to 8 months, next with Janett Billow Dr. Lenell Antu NP I spent 25 minutes  in total face to face time with the patient more than 50% of which was spent counseling and coordination of care, reviewing test results reviewing medications and  discussing and reviewing the diagnosis of migraine/headache syndrome   and further treatment options. Importance of keeping a diary if headaches worsen to include the time of the headache what you're doing any other specific information that would be useful. Discussed stress relief techniques such as deep breathing muscle relaxation mental relaxation to music. Discussed importance of exercise, regular meals  and sleep. Sleep deprivation can be a headache  trigger discussed starting CGRP antagonist Dennie Bible, [facility], [facility], APRN  [facility] [address], [place], [place] [address] [phone]

## 2017-12-06 ENCOUNTER — Ambulatory Visit: Payer: Medicare HMO | Admitting: Nurse Practitioner

## 2017-12-06 ENCOUNTER — Encounter: Payer: Self-pay | Admitting: Nurse Practitioner

## 2017-12-06 ENCOUNTER — Other Ambulatory Visit: Payer: Self-pay | Admitting: Nurse Practitioner

## 2017-12-06 DIAGNOSIS — G4489 Other headache syndrome: Secondary | ICD-10-CM | POA: Diagnosis not present

## 2017-12-06 DIAGNOSIS — R69 Illness, unspecified: Secondary | ICD-10-CM | POA: Diagnosis not present

## 2017-12-06 MED ORDER — GALCANEZUMAB-GNLM 120 MG/ML ~~LOC~~ SOSY: 120.0000 mg | PREFILLED_SYRINGE | SUBCUTANEOUS | 8 refills | Status: DC

## 2017-12-06 NOTE — Progress Notes (Signed)
Per c Hassell Done, NP, patient to be given Emgality 120 mg/ mL x two doses today. Patient was educated per insert in [employer]. She has used injectables in past and gives her husband injectable medication. She gave her self the Emgality injections in abdominal area > 2 " from umbilicus in two different sites.  She tolerated well. Advised her if she has skin reaction she may take Ibuprofen as needed Lot #[id number], exp 05/2019.

## 2017-12-06 NOTE — Patient Instructions (Signed)
Continue  Imitrex  100mg  when necessary acute migraine Continue verapamil 240 daily Given 240 Emgality today then 120mg  monthly Follow-up in 6 months

## 2017-12-07 ENCOUNTER — Telehealth: Payer: Self-pay | Admitting: *Deleted

## 2017-12-07 ENCOUNTER — Telehealth: Payer: Self-pay

## 2017-12-07 ENCOUNTER — Other Ambulatory Visit: Payer: Self-pay | Admitting: Medical

## 2017-12-07 MED ORDER — EPINEPHRINE 0.3 MG/0.3ML IJ SOAJ: 0.3000 mg | Freq: Once | INTRAMUSCULAR | 0 refills | Status: AC

## 2017-12-07 NOTE — Telephone Encounter (Signed)
[employer], spoke with Caryl Pina for PA on [employer] as a formulary exception, Medicare part D. Clinical questions answered. Caryl Pina stated medication is approved. Letter of approval will be faxed and copy mailed to patient.  Letter received. PA effective 03/20/17 through 03/21/18, Ref # [id number]. Called patient and LVM advising her of approval, left ref # and advised she call [facility] ot run Rx through. Left office number for any questions.

## 2017-12-07 NOTE — Telephone Encounter (Signed)
Pt would like epipen generic to be sent to her pharmacy.

## 2017-12-07 NOTE — Telephone Encounter (Signed)
done

## 2017-12-22 ENCOUNTER — Encounter: Payer: Self-pay | Admitting: Gastroenterology

## 2017-12-22 ENCOUNTER — Ambulatory Visit: Payer: Medicare HMO | Admitting: Gastroenterology

## 2017-12-22 VITALS — BP 140/78 | HR 76 | Ht 66.5 in | Wt 242.2 lb

## 2017-12-22 DIAGNOSIS — K5909 Other constipation: Secondary | ICD-10-CM

## 2017-12-22 DIAGNOSIS — F411 Generalized anxiety disorder: Secondary | ICD-10-CM

## 2017-12-22 DIAGNOSIS — R69 Illness, unspecified: Secondary | ICD-10-CM | POA: Diagnosis not present

## 2017-12-22 DIAGNOSIS — K648 Other hemorrhoids: Secondary | ICD-10-CM | POA: Diagnosis not present

## 2017-12-22 DIAGNOSIS — R1013 Epigastric pain: Secondary | ICD-10-CM

## 2017-12-22 MED ORDER — LUBIPROSTONE 24 MCG PO CAPS: 24.0000 ug | ORAL_CAPSULE | Freq: Two times a day (BID) | ORAL | 3 refills | Status: DC

## 2017-12-22 MED ORDER — BUSPIRONE HCL 7.5 MG PO TABS: 7.5000 mg | ORAL_TABLET | Freq: Two times a day (BID) | ORAL | 2 refills | Status: DC

## 2017-12-22 MED ORDER — DICYCLOMINE HCL 10 MG PO CAPS: 10.0000 mg | ORAL_CAPSULE | Freq: Three times a day (TID) | ORAL | 2 refills | Status: DC | PRN

## 2017-12-22 NOTE — Patient Instructions (Addendum)
If you are age 64 or older, your body mass index should be between 23-30. Your Body mass index is 38.51 kg/m. If this is out of the aforementioned range listed, please consider follow up with your Primary Care Provider.  If you are age 56 or younger, your body mass index should be between 19-25. Your Body mass index is 38.51 kg/m. If this is out of the aformentioned range listed, please consider follow up with your Primary Care Provider.   We have sent the following medications to your pharmacy for you to pick up at your convenience: Amitia 29mcg: Take twice a day Bentyl 10mg : Take 1 to 2 tablets every 8 hours as needed Buspirone 7.5mg : Take twice a day  We are giving you samples of Linzess 237mcg today.  Please take once a day until you are able to get your prescription of Amitiza.   Please call us in 2 weeks if you are not improved.   Thank you for entrusting me with your care and for choosing [facility], Dr. [place] Cellar

## 2017-12-22 NOTE — Progress Notes (Signed)
HPI :  61 year old female here for follow-up visit for constipation, bloating,  abdominal discomfort, epigastric discomfort.  Since last seen her she had an endoscopy and colonoscopy in March. Her endoscopy showed no evidence of H. Pylori, fairly normal exam. She otherwise had 2 small precancerous polyps in her colon with internal hemorrhoids and an anal fissure, no evidence of significant pathology or inflammatory changes.  Her main complaint at this time is constipation. I had previously given her a trial of Trulance which she reported worked quite well for her. We placed a prescription but it was not well covered by her insurance and could not afford it. She is also been placed on Linzess in the past which was too expensive, I previously prescribed Amitiza for her but she never got it. She's been tried on double dose of MiraLAX and has drank plenty of water with this, this does not seem to help too much. She has been using over-the-counter cleanses and stool softeners and has a bowel movement once every 3-5 days. She reports she is not taking any narcotics. She has a lot of bloating and discomfort in her abdomen which appears to be relieved with a bowel movement. Her hemorrhoids bother her from the constipation.  Her husband has dementia she has a lot of stress at home. She reports her stomach bothers her when she has stress and anxiety. She has some ongoing intermittent nausea.   EGD 05/23/2017 - normal esophagus, stomach, with brunner's gland hyperplasia of the bulb, bx for HP negative Colonoscopy 05/23/2017 - 2 small ascending colon polyps, anal fissure, internal hemorrhoids - repeat 5 years    Past Medical History:  Diagnosis Date  . Chronic constipation   . Cluster headache syndrome 04/27/13  . Colon polyp 06/11/2011   Most recent 04/23/08, no polyp--mild diverticulosis.  Repeat 10 yrs.  . Depression with anxiety 02/22/2008   Qualifier: Diagnosis of  By: Nolon Rod CMA (AAMA), Robin    .  Diverticulosis   . GERD (gastroesophageal reflux disease) 06/11/2011  . Hypertension    Recently diagnosed  . Migraine syndrome   . Osteopenia 11/2016   bone density. plan repeat in 11/2018  . Overweight(278.02) 06/11/2011  . PUD (peptic ulcer disease) 04/23/08   Multiple antrum ulcers; h pylori neg  . Rheumatoid arthritis(714.0)      Past Surgical History:  Procedure Laterality Date  . ABDOMINAL HYSTERECTOMY    . APPENDECTOMY    . BACK SURGERY     3 times  . CARDIOVASCULAR STRESS TEST  06/22/11   Normal; EF 65%  . CHOLECYSTECTOMY    . COLONOSCOPY  04/23/2008   mild diverticulosis, internal hemorrhoids; Dr. Erskine Emery  . LIVER BIOPSY  03/2007   Chronic active hepatitis w/fibrosis--related to hx of methotrexate treatment.  Marland Kitchen SKIN BIOPSY  08/16/2017   superficial basal cell carcinoma left inferior medial anterior tibia    Family History  Problem Relation Age of Onset  . Diabetes Mother   . Stroke Mother   . Arthritis Father   . Hypertension Father   . Atrial fibrillation Father   . Alcohol abuse Sister   . Cancer Sister        mesothelioma  . Heart disease Sister        cardiomegaly  . Hypertension Sister   . Pulmonary embolism Sister   . Hypertension Brother   . Polycystic ovary syndrome Daughter   . Heart disease Maternal Grandmother   . Heart disease Maternal Grandfather   .  Colon cancer Paternal Uncle   . Esophageal cancer Neg Hx   . Stomach cancer Neg Hx    Social History   Tobacco Use  . Smoking status: Never Smoker  . Smokeless tobacco: Never Used  Substance Use Topics  . Alcohol use: Yes    Comment: Occasional  . Drug use: No   Current Outpatient Medications  Medication Sig Dispense Refill  . acetaminophen (TYLENOL) 500 MG tablet Take 1,500 mg by mouth once as needed for mild pain or moderate pain.    Marland Kitchen ALPRAZolam (XANAX) 0.5 MG tablet TAKE 1 TABLET BY MOUTH EVERYDAY AT BEDTIME 60 tablet 0  . AMBULATORY NON FORMULARY MEDICATION Medication Name:  *Nitroglycerin 0.125% gel. Apply a pea size amount to your rectum three times daily x 6-8 weeks (Patient taking differently: Place 1 application rectally See admin instructions. Medication Name: *Nitroglycerin 0.125% gel. Apply a pea size amount to your rectum three times daily x 6-8 weeks) 30 g 0  . BIOTIN PO Take 1 capsule by mouth daily.    . calcium carbonate (CALCIUM 600) 600 MG TABS tablet Take 1 tablet (600 mg total) by mouth 2 (two) times daily with a meal. 180 tablet 3  . carisoprodol (SOMA) 350 MG tablet TAKE 1 TABLET BY MOUTH TWICE A DAY AND AT BEDTIME AS NEEDED FOR MUSCLE SPASMS  0  . Cholecalciferol (VITAMIN D3) 10000 units TABS Take 1 tablet by mouth daily. 90 tablet 3  . diphenhydrAMINE (BENADRYL) 25 mg capsule Take 50 mg by mouth once as needed for itching or allergies.    Marland Kitchen etodolac (LODINE) 400 MG tablet TAKE 1 TABLET BY MOUTH TWICE A DAY WITH FOOD AS NEEDED FOR PAIN  1  . famotidine (PEPCID) 20 MG tablet Take 1 tablet (20 mg total) by mouth 2 (two) times daily. 30 tablet 0  . gabapentin (NEURONTIN) 300 MG capsule TAKE (1) CAPSULE THREE TIMES DAILY. (Patient taking differently: Take 300 mg by mouth 3 (three) times daily as needed. ) 270 capsule 1  . Galcanezumab-gnlm (EMGALITY) 120 MG/ML SOSY Inject 120 mg into the skin every 30 (thirty) days. 1 Syringe 8  . HYDROcodone-acetaminophen (NORCO) 7.5-325 MG per tablet Take 1 tablet by mouth daily as needed for moderate pain. Reported on 09/05/2015    . hydroxychloroquine (PLAQUENIL) 200 MG tablet Take 400 mg by mouth daily.  2  . Insulin Pen Needle (PEN NEEDLES) 31G X 8 MM MISC 1 each by Does not apply route daily. 100 each 11  . liraglutide (VICTOZA) 18 MG/3ML SOPN Inject 0.3 mLs (1.8 mg total) into the skin at bedtime. 9 mL 5  . lisinopril (PRINIVIL,ZESTRIL) 20 MG tablet TAKE 1 TABLET (20 MG TOTAL) BY MOUTH DAILY. 90 tablet 3  . metFORMIN (GLUCOPHAGE) 500 MG tablet Take 1 tablet (500 mg total) by mouth daily with breakfast. 90 tablet 1    . omeprazole (PRILOSEC) 40 MG capsule Take 1 capsule (40 mg total) by mouth 2 (two) times daily. (Patient taking differently: Take 40 mg by mouth 2 (two) times daily as needed (FOR ACID REFLUX). ) 180 capsule 1  . rosuvastatin (CRESTOR) 20 MG tablet Take 1 tablet (20 mg total) by mouth at bedtime. 90 tablet 1  . SUMAtriptan (IMITREX) 100 MG tablet Take 1 tablet (100 mg total) by mouth every 2 (two) hours as needed for migraine. May repeat in 2 hours if headache persists or recurs. 9 tablet 6  . verapamil (CALAN-SR) 240 MG CR tablet Take 1 tablet (240 mg  total) by mouth at bedtime. 90 tablet 3   Current Facility-Administered Medications  Medication Dose Route Frequency Provider Last Rate Last Dose  . 0.9 %  sodium chloride infusion  500 mL Intravenous Once Armbruster, Carlota Raspberry, MD       Allergies  Allergen Reactions  . Bee Venom Anaphylaxis     Review of Systems: All systems reviewed and negative except where noted in HPI.   Lab Results  Component Value Date   WBC 7.0 05/19/2016   HGB 12.9 05/19/2016   HCT 38.5 05/19/2016   MCV 90.4 05/19/2016   PLT 287 05/19/2016    Lab Results  Component Value Date   CREATININE 1.05 (H) 07/20/2017   BUN 21 07/20/2017   NA 143 07/20/2017   K 4.8 07/20/2017   CL 105 07/20/2017   CO2 19 (L) 07/20/2017    Lab Results  Component Value Date   ALT 17 07/20/2017   AST 15 07/20/2017   ALKPHOS 58 07/20/2017   BILITOT <0.2 07/20/2017     Physical Exam: Ht 5' 6.5" (1.689 m) Comment: height measured without shoes  Wt 242 lb 4 oz (109.9 kg)   BMI 38.51 kg/m  Constitutional: Pleasant,well-developed, female in no acute distress. HEENT: Normocephalic and atraumatic. Conjunctivae are normal. No scleral icterus. Neck supple.  Cardiovascular: Normal rate, regular rhythm.  Pulmonary/chest: Effort normal and breath sounds normal. No wheezing, rales or rhonchi. Abdominal: Soft, nondistended, nontender.  There are no masses palpable. No  hepatomegaly. Extremities: no edema Lymphadenopathy: No cervical adenopathy noted. Neurological: Alert and oriented to person place and time. Skin: Skin is warm and dry. No rashes noted. Psychiatric: Normal mood and affect. Behavior is normal.   ASSESSMENT AND PLAN: 61 year old female here for reassessment following issues:  Chronic constipation / hemorrhoids - has done well on treatment and Linzess in the past but can afford it, insurance will cover it. Discussed other options with her. MiraLAX is not providing much benefit at this time. I prescribed her Amitiza 63mcg BID to see if that is more affordable, hopefully it is. Until she can get that I gave her free samples of Linzess 290 g once daily to use. Hopefully treatment of constipation will help her hemorrhoids. She can otherwise use Bentyl as needed for abdominal pains and cramps. If she continues have a lot of abdominal discomfort despite management of her constipation, I asked her to call back for reassessment. She's not had any prior cross sectional imaging of her abdomen, we may consider this if her discomfort persists despite management of constipation and dyspepsia.  Dyspepsia / Anxiety - I suspect she has dyspepsia in the setting of ongoing life stressors and anxiety. I discussed options with her. We'll try buspirone 7.5 mg twice daily to start. She tolerates this and it helps, can increase to 50 mg twice daily.  Dellwood Cellar, MD [facility]

## 2018-01-04 ENCOUNTER — Other Ambulatory Visit: Payer: Self-pay | Admitting: Medical

## 2018-01-04 NOTE — Telephone Encounter (Signed)
Is this ok to refill?  

## 2018-01-07 ENCOUNTER — Other Ambulatory Visit: Payer: Self-pay | Admitting: Medical

## 2018-01-13 ENCOUNTER — Other Ambulatory Visit: Payer: Self-pay | Admitting: Gastroenterology

## 2018-01-20 ENCOUNTER — Other Ambulatory Visit: Payer: Self-pay | Admitting: Medical

## 2018-01-20 NOTE — Telephone Encounter (Signed)
Is this ok to refill?  

## 2018-01-23 ENCOUNTER — Encounter: Payer: Self-pay | Admitting: Medical

## 2018-01-23 ENCOUNTER — Ambulatory Visit (INDEPENDENT_AMBULATORY_CARE_PROVIDER_SITE_OTHER): Payer: Medicare HMO | Admitting: Medical

## 2018-01-23 VITALS — BP 130/80 | HR 68 | Temp 98.2°F | Resp 16 | Ht 67.0 in | Wt 234.8 lb

## 2018-01-23 DIAGNOSIS — F329 Major depressive disorder, single episode, unspecified: Secondary | ICD-10-CM

## 2018-01-23 DIAGNOSIS — K635 Polyp of colon: Secondary | ICD-10-CM | POA: Diagnosis not present

## 2018-01-23 DIAGNOSIS — Z7185 Encounter for immunization safety counseling: Secondary | ICD-10-CM

## 2018-01-23 DIAGNOSIS — F419 Anxiety disorder, unspecified: Secondary | ICD-10-CM

## 2018-01-23 DIAGNOSIS — G4700 Insomnia, unspecified: Secondary | ICD-10-CM

## 2018-01-23 DIAGNOSIS — N289 Disorder of kidney and ureter, unspecified: Secondary | ICD-10-CM | POA: Diagnosis not present

## 2018-01-23 DIAGNOSIS — Z0000 Encounter for general adult medical examination without abnormal findings: Secondary | ICD-10-CM | POA: Diagnosis not present

## 2018-01-23 DIAGNOSIS — R69 Illness, unspecified: Secondary | ICD-10-CM | POA: Diagnosis not present

## 2018-01-23 DIAGNOSIS — R609 Edema, unspecified: Secondary | ICD-10-CM

## 2018-01-23 DIAGNOSIS — Z8669 Personal history of other diseases of the nervous system and sense organs: Secondary | ICD-10-CM

## 2018-01-23 DIAGNOSIS — F32A Depression, unspecified: Secondary | ICD-10-CM

## 2018-01-23 DIAGNOSIS — E118 Type 2 diabetes mellitus with unspecified complications: Secondary | ICD-10-CM

## 2018-01-23 DIAGNOSIS — M8580 Other specified disorders of bone density and structure, unspecified site: Secondary | ICD-10-CM | POA: Diagnosis not present

## 2018-01-23 DIAGNOSIS — E559 Vitamin D deficiency, unspecified: Secondary | ICD-10-CM

## 2018-01-23 DIAGNOSIS — I10 Essential (primary) hypertension: Secondary | ICD-10-CM | POA: Diagnosis not present

## 2018-01-23 DIAGNOSIS — I872 Venous insufficiency (chronic) (peripheral): Secondary | ICD-10-CM | POA: Diagnosis not present

## 2018-01-23 DIAGNOSIS — K5909 Other constipation: Secondary | ICD-10-CM | POA: Diagnosis not present

## 2018-01-23 DIAGNOSIS — E669 Obesity, unspecified: Secondary | ICD-10-CM

## 2018-01-23 DIAGNOSIS — M2550 Pain in unspecified joint: Secondary | ICD-10-CM

## 2018-01-23 DIAGNOSIS — R809 Proteinuria, unspecified: Secondary | ICD-10-CM

## 2018-01-23 DIAGNOSIS — Z7189 Other specified counseling: Secondary | ICD-10-CM

## 2018-01-23 DIAGNOSIS — R102 Pelvic and perineal pain: Secondary | ICD-10-CM

## 2018-01-23 DIAGNOSIS — E785 Hyperlipidemia, unspecified: Secondary | ICD-10-CM

## 2018-01-23 DIAGNOSIS — E2839 Other primary ovarian failure: Secondary | ICD-10-CM

## 2018-01-23 DIAGNOSIS — Z79899 Other long term (current) drug therapy: Secondary | ICD-10-CM

## 2018-01-23 LAB — POCT URINALYSIS DIP (PROADVANTAGE DEVICE)
Blood, UA: NEGATIVE
Glucose, UA: NEGATIVE mg/dL
Ketones, POC UA: NEGATIVE mg/dL
Leukocytes, UA: NEGATIVE
Nitrite, UA: NEGATIVE
Protein Ur, POC: 100 mg/dL — AB
Specific Gravity, Urine: 1.02
Urobilinogen, Ur: NEGATIVE
pH, UA: 5.5 (ref 5.0–8.0)

## 2018-01-23 NOTE — Patient Instructions (Addendum)
Recommendations:  Shingles vaccine:  I recommend you have a shingles vaccine to help prevent shingles or herpes zoster outbreak.   Please call your insurer to inquire about coverage for the Shingrix vaccine given in 2 doses.   Some insurers cover this vaccine after age 64, some cover this after age 53.  If your insurer covers this, then call to schedule appointment to have this vaccine here.  Also call insurance about coverage for Tetanus booster as well.  Please give [place] a copy of your healthcare power of attorney and living will  Try to get exercise regularly such as walking 30 minutes 5 days a week  Consider counseling  We will schedule you for a pelvic ultrasound  Get your mammogram yearly

## 2018-01-23 NOTE — Progress Notes (Signed)
Subjective:    Emma [name] is a 62 y.o. female who presents for Preventative Services visit and chronic medical problems/med check visit.    Primary Care Provider Tysinger, Camelia Eng, PA-C here for primary care  Current Health Care Team:  Dentist, Dr. Dr Reinaldo Raddle  Eye doctor, Dr. Chauncey Reading   [facility] Rheumatology  Dr. Havery Moros, GI  Evlyn Courier, NP, neurology   Medical Services you may have received from other than [facility] providers in the past year (date may be approximate) rheumatology  Exercise Current exercise habits: none   Nutrition/Diet Current diet: none  Depression Screen Depression screen [facility] 2/9 01/23/2018  Decreased Interest 0  Down, Depressed, Hopeless 1  PHQ - 2 Score 1    Activities of Daily Living Screen/Functional Status Survey Is the patient deaf or have difficulty hearing?: No Does the patient have difficulty seeing, even when wearing glasses/contacts?: No Does the patient have difficulty concentrating, remembering, or making decisions?: No Does the patient have difficulty walking or climbing stairs?: No Does the patient have difficulty dressing or bathing?: No Does the patient have difficulty doing errands alone such as visiting a doctor's office or shopping?: No  Can patient draw a clock face showing 3:15 oclock, yes  Fall Risk Screen Fall Risk  01/23/2018 07/20/2017 06/02/2017  Falls in the past year? 0 No No    Gait Assessment: Normal gait observed yes  Advanced directives Does patient have a [place]? Yes  Does patient have a Living Will? Yes   Past Medical History:  Diagnosis Date  . Chronic constipation   . Cluster headache syndrome 04/27/13  . Colon polyp 06/11/2011   Most recent 04/23/08, no polyp--mild diverticulosis.  Repeat 10 yrs.  . Depression with anxiety 02/22/2008   Qualifier: Diagnosis of  By: Nolon Rod CMA (AAMA), Robin    . Diverticulosis   . GERD (gastroesophageal reflux disease) 06/11/2011    . Hypertension    Recently diagnosed  . Migraine syndrome   . Osteopenia 11/2016   bone density. plan repeat in 11/2018  . Overweight(278.02) 06/11/2011  . PUD (peptic ulcer disease) 04/23/08   Multiple antrum ulcers; h pylori neg  . Rheumatoid arthritis(714.0)     Past Surgical History:  Procedure Laterality Date  . ABDOMINAL HYSTERECTOMY    . APPENDECTOMY    . BACK SURGERY     3 times  . CARDIOVASCULAR STRESS TEST  06/22/11   Normal; EF 65%  . CHOLECYSTECTOMY    . COLONOSCOPY  04/23/2008   mild diverticulosis, internal hemorrhoids; Dr. Erskine Emery  . LIVER BIOPSY  03/2007   Chronic active hepatitis w/fibrosis--related to hx of methotrexate treatment.  Marland Kitchen SKIN BIOPSY  08/16/2017   superficial basal cell carcinoma left inferior medial anterior tibia     Social History   Socioeconomic History  . Marital status: Married    Spouse name: Not on file  . Number of children: 1  . Years of education: Not on file  . Highest education level: Not on file  Occupational History  . Not on file  Social Needs  . Financial resource strain: Not on file  . Food insecurity:    Worry: Not on file    Inability: Not on file  . Transportation needs:    Medical: Not on file    Non-medical: Not on file  Tobacco Use  . Smoking status: Never Smoker  . Smokeless tobacco: Never Used  Substance and Sexual Activity  . Alcohol use: Yes  Comment: Occasional  . Drug use: No  . Sexual activity: Yes  Lifestyle  . Physical activity:    Days per week: Not on file    Minutes per session: Not on file  . Stress: Not on file  Relationships  . Social connections:    Talks on phone: Not on file    Gets together: Not on file    Attends religious service: Not on file    Active member of club or organization: Not on file    Attends meetings of clubs or organizations: Not on file    Relationship status: Not on file  . Intimate partner violence:    Fear of current or ex partner: Not on file     Emotionally abused: Not on file    Physically abused: Not on file    Forced sexual activity: Not on file  Other Topics Concern  . Not on file  Social History Narrative  . Not on file    Family History  Problem Relation Age of Onset  . Diabetes Mother   . Stroke Mother   . Arthritis Father   . Hypertension Father   . Atrial fibrillation Father   . Alcohol abuse Sister   . Cancer Sister        mesothelioma  . Heart disease Sister        cardiomegaly  . Hypertension Sister   . Pulmonary embolism Sister   . Hypertension Brother   . Polycystic ovary syndrome Daughter   . Heart disease Maternal Grandmother   . Heart disease Maternal Grandfather   . Colon cancer Paternal Uncle   . Esophageal cancer Neg Hx   . Stomach cancer Neg Hx      Current Outpatient Medications:  .  acetaminophen (TYLENOL) 500 MG tablet, Take 1,500 mg by mouth once as needed for mild pain or moderate pain., Disp: , Rfl:  .  ALPRAZolam (XANAX) 0.5 MG tablet, TAKE 1 TABLET BY MOUTH EVERYDAY AT BEDTIME, Disp: 60 tablet, Rfl: 0 .  AMBULATORY NON FORMULARY MEDICATION, Medication Name: *Nitroglycerin 0.125% gel. Apply a pea size amount to your rectum three times daily x 6-8 weeks (Patient taking differently: Place 1 application rectally See admin instructions. Medication Name: *Nitroglycerin 0.125% gel. Apply a pea size amount to your rectum three times daily x 6-8 weeks), Disp: 30 g, Rfl: 0 .  BIOTIN PO, Take 1 capsule by mouth daily., Disp: , Rfl:  .  busPIRone (BUSPAR) 7.5 MG tablet, TAKE 1 TABLET (7.5 MG TOTAL) BY MOUTH 2 (TWO) TIMES DAILY., Disp: 180 tablet, Rfl: 1 .  carisoprodol (SOMA) 350 MG tablet, TAKE 1 TABLET BY MOUTH TWICE A DAY AND AT BEDTIME AS NEEDED FOR MUSCLE SPASMS, Disp: , Rfl: 0 .  Cholecalciferol (VITAMIN D3) 10000 units TABS, Take 1 tablet by mouth daily., Disp: 90 tablet, Rfl: 3 .  [employer] CALCIUM 1500 (600 Ca) MG TABS tablet, TAKE 1 TABLET (600 MG TOTAL) BY MOUTH 2 (TWO) TIMES DAILY WITH A MEAL.,  Disp: 180 tablet, Rfl: 1 .  dicyclomine (BENTYL) 10 MG capsule, Take 1-2 capsules (10-20 mg total) by mouth every 8 (eight) hours as needed for spasms., Disp: 60 capsule, Rfl: 2 .  diphenhydrAMINE (BENADRYL) 25 mg capsule, Take 50 mg by mouth once as needed for itching or allergies., Disp: , Rfl:  .  etodolac (LODINE) 400 MG tablet, TAKE 1 TABLET BY MOUTH TWICE A DAY WITH FOOD AS NEEDED FOR PAIN, Disp: , Rfl: 1 .  gabapentin (NEURONTIN)  300 MG capsule, TAKE (1) CAPSULE THREE TIMES DAILY. (Patient taking differently: Take 300 mg by mouth 3 (three) times daily as needed. ), Disp: 270 capsule, Rfl: 1 .  HYDROcodone-acetaminophen (NORCO) 7.5-325 MG per tablet, Take 1 tablet by mouth daily as needed for moderate pain. Reported on 09/05/2015, Disp: , Rfl:  .  hydroxychloroquine (PLAQUENIL) 200 MG tablet, Take 400 mg by mouth daily., Disp: , Rfl: 2 .  Insulin Pen Needle (PEN NEEDLES) 31G X 8 MM MISC, 1 each by Does not apply route daily., Disp: 100 each, Rfl: 11 .  liraglutide (VICTOZA) 18 MG/3ML SOPN, Inject 0.3 mLs (1.8 mg total) into the skin at bedtime., Disp: 9 mL, Rfl: 5 .  lisinopril (PRINIVIL,ZESTRIL) 20 MG tablet, TAKE 1 TABLET (20 MG TOTAL) BY MOUTH DAILY., Disp: 90 tablet, Rfl: 3 .  lubiprostone (AMITIZA) 24 MCG capsule, Take 1 capsule (24 mcg total) by mouth 2 (two) times daily with a meal., Disp: 60 capsule, Rfl: 3 .  metFORMIN (GLUCOPHAGE) 500 MG tablet, Take 1 tablet (500 mg total) by mouth daily with breakfast., Disp: 90 tablet, Rfl: 1 .  omeprazole (PRILOSEC) 40 MG capsule, Take 1 capsule (40 mg total) by mouth 2 (two) times daily. (Patient taking differently: Take 40 mg by mouth 2 (two) times daily as needed (FOR ACID REFLUX). ), Disp: 180 capsule, Rfl: 1 .  rosuvastatin (CRESTOR) 20 MG tablet, TAKE 1 TABLET BY MOUTH EVERYDAY AT BEDTIME, Disp: 90 tablet, Rfl: 0 .  SUMAtriptan (IMITREX) 100 MG tablet, Take 1 tablet (100 mg total) by mouth every 2 (two) hours as needed for migraine. May repeat  in 2 hours if headache persists or recurs., Disp: 9 tablet, Rfl: 6 .  verapamil (CALAN-SR) 240 MG CR tablet, Take 1 tablet (240 mg total) by mouth at bedtime., Disp: 90 tablet, Rfl: 3 .  Galcanezumab-gnlm (EMGALITY) 120 MG/ML SOSY, Inject 120 mg into the skin every 30 (thirty) days. (Patient not taking: Reported on 01/23/2018), Disp: 1 Syringe, Rfl: 8  Current Facility-Administered Medications:  .  0.9 %  sodium chloride infusion, 500 mL, Intravenous, Once, Armbruster, Carlota Raspberry, MD  Allergies  Allergen Reactions  . Bee Venom Anaphylaxis    History reviewed: allergies, current medications, past family history, past medical history, past social history, past surgical history and problem list  Chronic issues discussed: Pelvic pain, self stimulates genitalia but gets pain for 10 minute after orgasm.  Not currently having sex.  Husband is not able to for last few years  Compliant with medications  Diabetes - not checking sugars often.  Sometimes is not compliant with medications  Acute issues discussed: none  Objective:      Biometrics BP 130/80   Pulse 68   Temp 98.2 F (36.8 C) (Oral)   Resp 16   Ht 5\' 7"  (1.702 m)   Wt 234 lb 12.8 oz (106.5 kg)   SpO2 97%   BMI 36.77 kg/m   Gen: wd, wn, nad HEENT: normocephalic, sclerae anicteric, TMs pearly, nares patent, no discharge or erythema, pharynx normal Oral cavity: MMM, no lesions Neck: supple, no lymphadenopathy, no thyromegaly, no masses Heart: RRR, normal S1, S2, no murmurs Lungs: CTA bilaterally, no wheezes, rhonchi, or rales Abdomen: +bs, soft, surgical scars from prior choly and appendectomy, non tender, non distended, no masses, no hepatomegaly, no splenomegaly Musculoskeletal: bony arthritis changes of DIPs bilat, nontender, no swelling, no obvious deformity Extremities: no edema, no cyanosis, no clubbing Pulses: 2+ symmetric, upper and lower extremities, normal cap  refill Neurological: alert, oriented x 3, CN2-12  intact, strength normal upper extremities and lower extremities, sensation normal throughout, DTRs 2+ throughout, no cerebellar signs, gait normal Psychiatric: normal affect, behavior normal, pleasant   Breast: nontender, no masses or lumps, no skin changes, no nipple discharge or inversion, no axillary lymphadenopathy Gyn: Normal external genitalia without lesions, vagina with normal mucosa, s/p hysterectomy, no abnormal vaginal discharge.  Adnexa not enlarged,+tenderness bilat adnexal region, no masses.   Exam chaperoned by nurse. Rectal: hemorrhoids present, small    Assessment:   Encounter Diagnoses  Name Primary?  . Routine general medical examination at a health care facility Yes  . Essential hypertension   . Chronic venous insufficiency   . Chronic constipation   . Polyp of colon, unspecified part of colon, unspecified type   . Diabetes mellitus with complication ([place])   . Osteopenia, unspecified location   . Renal insufficiency   . Anxiety and depression   . Edema, unspecified type   . Estrogen deficiency   . High risk medication use   . History of migraine headaches   . Hyperlipidemia, unspecified hyperlipidemia type   . Insomnia, unspecified type   . Low bone mass   . Microalbuminuria   . Obesity, unspecified classification, unspecified obesity type, unspecified whether serious comorbidity present   . Polyarthralgia   . Vitamin D deficiency   . Vaccine counseling   . Pelvic pain   . Medicare annual wellness visit, subsequent      Plan:   A preventative services visit was completed today.  During the course of the visit today, we discussed and counseled about appropriate screening and preventive services.  A health risk assessment was established today that included a review of current medications, allergies, social history, family history, medical and preventative health history, biometrics, and preventative screenings to identify potential safety concerns or  impairments.  A personalized plan was printed today for your records and use.   Personalized health advice and education was given today to reduce health risks and promote self management and wellness.  Information regarding end of life planning was discussed today.  Conditions/risks identified: Pelvic pain    Chronic problems discussed today: Pelvic pain - referral for pelvic US  diabetes - labs today, c/t glucose monitoring, counseled on compliance, daily foot checks, yearly eye doctor visits  C/t rest of medications as usual  polyarthralgia - managed by rheumatology  Constipation - reviewed recent GI notes from recent consult    Acute problems discussed today: Pelvic pain  Cancer screens Up to date on mammogram, colonoscopy, s/p hysterectomy   Recommendations:  I recommend a yearly ophthalmology/optometry visit for glaucoma screening and eye checkup  I recommended a yearly dental visit for hygiene and checkup  Advanced directives - discussed nature and purpose of Advanced Directives, encouraged them to complete them if they have not done so and/or encouraged them to get [place] a copy if they have done this already.   Referrals today: Pelvic US  Immunizations: Up to date on flu shot Up to date on pneumococcal vaccines Advised she check insurance coverage for Td and Shingrix  Yun was seen today for mwv.  Diagnoses and all orders for this visit:  Routine general medical examination at a health care facility -     POCT Urinalysis DIP (Proadvantage Device) -     Hemoglobin A1c -     Microalbumin / creatinine urine ratio -     Basic metabolic panel  Essential hypertension  Chronic venous insufficiency  Chronic constipation  Polyp of colon, unspecified part of colon, unspecified type  Diabetes mellitus with complication (HCC) -     Hemoglobin A1c -     Microalbumin / creatinine urine ratio -     Basic metabolic panel  Osteopenia, unspecified  location  Renal insufficiency -     Microalbumin / creatinine urine ratio -     Basic metabolic panel  Anxiety and depression  Edema, unspecified type  Estrogen deficiency  High risk medication use  History of migraine headaches  Hyperlipidemia, unspecified hyperlipidemia type  Insomnia, unspecified type  Low bone mass  Microalbuminuria  Obesity, unspecified classification, unspecified obesity type, unspecified whether serious comorbidity present  Polyarthralgia  Vitamin D deficiency  Vaccine counseling  Pelvic pain -     US PELVIS (TRANSABDOMINAL ONLY); Future -     US PELVIS TRANSVANGINAL NON-OB (TV ONLY); Future  Medicare annual wellness visit, subsequent     Medicare Attestation A preventative services visit was completed today.  During the course of the visit the patient was educated and counseled about appropriate screening and preventive services.  A health risk assessment was established with the patient that included a review of current medications, allergies, social history, family history, medical and preventative health history, biometrics, and preventative screenings to identify potential safety concerns or impairments.  A personalized plan was printed today for the patient's records and use.   Personalized health advice and education was given today to reduce health risks and promote self management and wellness.  Information regarding end of life planning was discussed today.  Dorothea Ogle, PA-C   01/23/2018

## 2018-01-24 LAB — BASIC METABOLIC PANEL
BUN/Creatinine Ratio: 11 — ABNORMAL LOW (ref 12–28)
BUN: 15 mg/dL (ref 8–27)
CO2: 23 mmol/L (ref 20–29)
Calcium: 10.3 mg/dL (ref 8.7–10.3)
Chloride: 102 mmol/L (ref 96–106)
Creatinine, Ser: 1.32 mg/dL — ABNORMAL HIGH (ref 0.57–1.00)
GFR calc Af Amer: 49 mL/min/{1.73_m2} — ABNORMAL LOW (ref >59)
GFR calc non Af Amer: 43 mL/min/{1.73_m2} — ABNORMAL LOW (ref >59)
Glucose: 83 mg/dL (ref 65–99)
Potassium: 4.8 mmol/L (ref 3.5–5.2)
Sodium: 145 mmol/L — ABNORMAL HIGH (ref 134–144)

## 2018-01-24 LAB — MICROALBUMIN / CREATININE URINE RATIO
Creatinine, Urine: 213.2 mg/dL
Microalb/Creat Ratio: 575.4 mg/g creat — ABNORMAL HIGH (ref 0.0–30.0)
Microalbumin, Urine: 1226.8 ug/mL

## 2018-01-24 LAB — HEMOGLOBIN A1C
Est. average glucose Bld gHb Est-mCnc: 137 mg/dL
Hgb A1c MFr Bld: 6.4 % — ABNORMAL HIGH (ref 4.8–5.6)

## 2018-01-25 ENCOUNTER — Other Ambulatory Visit: Payer: Self-pay | Admitting: Medical

## 2018-01-25 MED ORDER — VERAPAMIL HCL ER 240 MG PO TBCR: 240.0000 mg | EXTENDED_RELEASE_TABLET | Freq: Every day | ORAL | 3 refills | Status: DC

## 2018-01-25 MED ORDER — LIRAGLUTIDE 18 MG/3ML ~~LOC~~ SOPN: 1.8000 mg | PEN_INJECTOR | Freq: Every day | SUBCUTANEOUS | 5 refills | Status: DC

## 2018-01-25 MED ORDER — METFORMIN HCL 500 MG PO TABS: 500.0000 mg | ORAL_TABLET | Freq: Every day | ORAL | 3 refills | Status: DC

## 2018-01-25 MED ORDER — ALPRAZOLAM 0.5 MG PO TABS: ORAL_TABLET | ORAL | 2 refills | Status: DC

## 2018-01-25 MED ORDER — VITAMIN D3 250 MCG (10000 UT) PO TABS: 1.0000 | ORAL_TABLET | Freq: Every day | ORAL | 3 refills | Status: DC

## 2018-01-25 MED ORDER — ROSUVASTATIN CALCIUM 20 MG PO TABS: ORAL_TABLET | ORAL | 3 refills | Status: DC

## 2018-01-25 MED ORDER — GABAPENTIN 300 MG PO CAPS: 300.0000 mg | ORAL_CAPSULE | Freq: Three times a day (TID) | ORAL | 5 refills | Status: DC | PRN

## 2018-01-27 ENCOUNTER — Encounter: Payer: Medicare HMO | Admitting: Gastroenterology

## 2018-01-31 ENCOUNTER — Other Ambulatory Visit: Payer: Self-pay | Admitting: Medical

## 2018-01-31 DIAGNOSIS — Z90710 Acquired absence of both cervix and uterus: Secondary | ICD-10-CM | POA: Diagnosis not present

## 2018-01-31 DIAGNOSIS — R102 Pelvic and perineal pain: Secondary | ICD-10-CM

## 2018-01-31 DIAGNOSIS — Z1231 Encounter for screening mammogram for malignant neoplasm of breast: Secondary | ICD-10-CM | POA: Diagnosis not present

## 2018-01-31 DIAGNOSIS — Z8262 Family history of osteoporosis: Secondary | ICD-10-CM | POA: Diagnosis not present

## 2018-01-31 DIAGNOSIS — M069 Rheumatoid arthritis, unspecified: Secondary | ICD-10-CM | POA: Diagnosis not present

## 2018-01-31 DIAGNOSIS — Z803 Family history of malignant neoplasm of breast: Secondary | ICD-10-CM | POA: Diagnosis not present

## 2018-01-31 DIAGNOSIS — M8589 Other specified disorders of bone density and structure, multiple sites: Secondary | ICD-10-CM | POA: Diagnosis not present

## 2018-01-31 LAB — HM MAMMOGRAPHY

## 2018-01-31 LAB — HM DEXA SCAN

## 2018-02-02 ENCOUNTER — Telehealth: Payer: Self-pay | Admitting: Medical

## 2018-02-02 NOTE — Telephone Encounter (Signed)
I am happy to report that her mammogram was normal, no worrisome findings.  Her bone density test from 01/31/2018 shows an increase in the bone mineral density, still showing osteopenia but no worsening.  Continue vitamin D supplementation, try to get routine aerobic and weightbearing exercise.  No current indication for bisphosphonate treatment such as Fosamax  I would recommend repeat testing 2 years

## 2018-02-03 NOTE — Telephone Encounter (Signed)
Pt was notified of results

## 2018-02-03 NOTE — Telephone Encounter (Signed)
Left message on voicemail for patient to call back. 

## 2018-02-06 ENCOUNTER — Inpatient Hospital Stay: Admission: RE | Admit: 2018-02-06 | Payer: Medicare HMO | Source: Ambulatory Visit

## 2018-02-06 ENCOUNTER — Telehealth: Payer: Self-pay | Admitting: Nurse Practitioner

## 2018-02-06 MED ORDER — VENLAFAXINE HCL 37.5 MG PO TABS: 37.5000 mg | ORAL_TABLET | Freq: Every day | ORAL | 3 refills | Status: DC

## 2018-02-06 NOTE — Telephone Encounter (Signed)
Patient has had a migraine x3 weeks. SUMAtriptan (IMITREX) 100 MG tablet and gabapentin (NEURONTIN) 300 MG capsule are not helping. Can she come in today for an infusion? She has an appointment at 2pm today so please leave a message if cannot reach her.

## 2018-02-06 NOTE — Addendum Note (Signed)
Addended by: Otilio Jefferson on: 02/06/2018 04:03 PM   Modules accepted: Orders

## 2018-02-06 NOTE — Telephone Encounter (Signed)
Apply on line for pt assistance for emgality at [email or web address]. Your insurance approved it you have a high deductable.  In the meantime try Effexor for anxiety and stress. I will call this in. Please call the pt

## 2018-02-06 NOTE — Telephone Encounter (Signed)
Spoke with the patient and she verbalized understanding the instructions given. No questions or concerns at this time.

## 2018-02-06 NOTE — Telephone Encounter (Signed)
Spoke with the patient and she stated that she wakes up with a headache every morning due to stress. She stated that she couldn't afford the Emgality injections due to her being in a donut hole. Infusion suite is closed for the day. Patient stated would you be willing to call something else in for her try. She stated that Imitrex and Gabapentin do nothing for her migraines. Please advise

## 2018-02-07 NOTE — Telephone Encounter (Signed)
Please check with Otila Kluver in the morning about Depacon sometime this week then let me know to fill out the paperwork.  I do not really think this is going to help

## 2018-02-07 NOTE — Telephone Encounter (Signed)
Patient wants to come in for an infusion for her headaches.

## 2018-02-07 NOTE — Telephone Encounter (Signed)
Spoke with Emma [name] and she stated that she did pick up her effexor but she is still having a migraines. She stated no matter what does she is unable to get rid of them. She stated that she is available anytime this week to come in for an infusion. Please advise.

## 2018-02-08 ENCOUNTER — Ambulatory Visit
Admission: RE | Admit: 2018-02-08 | Discharge: 2018-02-08 | Disposition: A | Discharge status: Home / Self Care | Payer: Medicare HMO | Source: Ambulatory Visit | Attending: Medical | Admitting: Medical

## 2018-02-08 DIAGNOSIS — R102 Pelvic and perineal pain: Secondary | ICD-10-CM

## 2018-02-08 NOTE — Telephone Encounter (Signed)
Spoke with Otila Kluver and we do have Depacon in. She has can fit her in between 1-3 today.  I called Mrs. Mcconahy but was unable to reach her due to her phone going to voicemail. I left a message asking her to call me back so that we can get her in possibly for a infusion today.

## 2018-02-08 NOTE — Telephone Encounter (Signed)
Unable to get in contact with Emma [name] in regards to her Depacon infusion. I left a voicemail letting her know that she come in tomorrow to get her infusion anytime after 1pm. Patient is aware that the infusion suite closes at 3pm. Office number was provided.

## 2018-02-08 NOTE — Telephone Encounter (Signed)
Pt scheduled for 1pm on 02/09/18

## 2018-02-08 NOTE — Telephone Encounter (Signed)
Pt returning CMAs call would like the 3 appt

## 2018-02-09 DIAGNOSIS — G4489 Other headache syndrome: Secondary | ICD-10-CM | POA: Diagnosis not present

## 2018-02-10 ENCOUNTER — Ambulatory Visit
Admission: RE | Admit: 2018-02-10 | Discharge: 2018-02-10 | Disposition: A | Discharge status: Home / Self Care | Payer: Medicare HMO | Source: Ambulatory Visit | Attending: Medical | Admitting: Medical

## 2018-02-10 DIAGNOSIS — R102 Pelvic and perineal pain: Secondary | ICD-10-CM | POA: Diagnosis not present

## 2018-02-13 ENCOUNTER — Encounter: Payer: Self-pay | Admitting: Gastroenterology

## 2018-02-13 ENCOUNTER — Ambulatory Visit (INDEPENDENT_AMBULATORY_CARE_PROVIDER_SITE_OTHER): Payer: Medicare HMO | Admitting: Gastroenterology

## 2018-02-13 VITALS — BP 110/72 | HR 78 | Ht 67.0 in | Wt 234.0 lb

## 2018-02-13 DIAGNOSIS — K642 Third degree hemorrhoids: Secondary | ICD-10-CM

## 2018-02-13 NOTE — Patient Instructions (Signed)
If you are age 64 or older, your body mass index should be between 23-30. Your Body mass index is 36.65 kg/m. If this is out of the aforementioned range listed, please consider follow up with your Primary Care Provider.  If you are age 35 or younger, your body mass index should be between 19-25. Your Body mass index is 36.65 kg/m. If this is out of the aformentioned range listed, please consider follow up with your Primary Care Provider.   HEMORRHOID BANDING PROCEDURE    FOLLOW-UP CARE   1. The procedure you have had should have been relatively painless since the banding of the area involved does not have nerve endings and there is no pain sensation.  The rubber band cuts off the blood supply to the hemorrhoid and the band may fall off as soon as 48 hours after the banding (the band may occasionally be seen in the toilet bowl following a bowel movement). You may notice a temporary feeling of fullness in the rectum which should respond adequately to plain Tylenol or Motrin.  2. Following the banding, avoid strenuous exercise that evening and resume full activity the next day.  A sitz bath (soaking in a warm tub) or bidet is soothing, and can be useful for cleansing the area after bowel movements.     3. To avoid constipation, take two tablespoons of natural wheat bran, natural oat bran, flax, Benefiber or any over the counter fiber supplement and increase your water intake to 7-8 glasses daily.    4. Unless you have been prescribed anorectal medication, do not put anything inside your rectum for two weeks: No suppositories, enemas, fingers, etc.  5. Occasionally, you may have more bleeding than usual after the banding procedure.  This is often from the untreated hemorrhoids rather than the treated one.  Don't be concerned if there is a tablespoon or so of blood.  If there is more blood than this, lie flat with your bottom higher than your head and apply an ice pack to the area. If the bleeding  does not stop within a half an hour or if you feel faint, call our office at [phone] or go to the emergency room.  6. Problems are not common; however, if there is a substantial amount of bleeding, severe pain, chills, fever or difficulty passing urine (very rare) or other problems, you should call us at [phone] or report to the nearest emergency room.  7. Do not stay seated continuously for more than 2-3 hours for a day or two after the procedure.  Tighten your buttock muscles 10-15 times every two hours and take 10-15 deep breaths every 1-2 hours.  Do not spend more than a few minutes on the toilet if you cannot empty your bowel; instead re-visit the toilet at a later time.    Thank you for entrusting me with your care and for choosing [facility], Dr. Grant-Valkaria Cellar

## 2018-02-13 NOTE — Progress Notes (Signed)
62 y/o female here for hemorrhoid banding. Colonoscopy done earlier this year showing hemorrhoids. She has symptoms of pruritus and irritation, and prolapse (grade III). She otherwise has been on Amitiza 12mcg BID and it has helped her constipation. Also placed on buspirone 7.5mg  BID which has helped her dyspepsia and anxiety. Overall pleased with regimen thus far.  PROCEDURE NOTE: The patient presents with symptomatic grade III  hemorrhoids, requesting rubber band ligation of his/her hemorrhoidal disease.  All risks, benefits and alternative forms of therapy were described and informed consent was obtained.   The anorectum was pre-medicated with 0.125% nitroglycerin The decision was made to band the LL internal hemorrhoid, and the [place] was used to perform band ligation without complication.  Digital anorectal examination was then performed to assure proper positioning of the band, and to adjust the banded tissue as required.  The patient was discharged home without pain or other issues.  Dietary and behavioral recommendations were given and along with follow-up instructions.     The following adjunctive treatments were recommended: Amitiza 12mcg BID  The patient will return in 2-4 weeks for  follow-up and possible additional banding as required. No complications were encountered and the patient tolerated the procedure well.  McNair Cellar, MD [facility]

## 2018-02-23 DIAGNOSIS — G8929 Other chronic pain: Secondary | ICD-10-CM | POA: Diagnosis not present

## 2018-02-23 DIAGNOSIS — Z79899 Other long term (current) drug therapy: Secondary | ICD-10-CM | POA: Diagnosis not present

## 2018-02-23 DIAGNOSIS — M154 Erosive (osteo)arthritis: Secondary | ICD-10-CM | POA: Diagnosis not present

## 2018-02-23 DIAGNOSIS — M25512 Pain in left shoulder: Secondary | ICD-10-CM | POA: Diagnosis not present

## 2018-02-23 DIAGNOSIS — E669 Obesity, unspecified: Secondary | ICD-10-CM | POA: Diagnosis not present

## 2018-02-23 DIAGNOSIS — M2550 Pain in unspecified joint: Secondary | ICD-10-CM | POA: Diagnosis not present

## 2018-02-23 DIAGNOSIS — Z6836 Body mass index (BMI) 36.0-36.9, adult: Secondary | ICD-10-CM | POA: Diagnosis not present

## 2018-02-27 ENCOUNTER — Encounter: Payer: Medicare HMO | Admitting: Gastroenterology

## 2018-02-27 ENCOUNTER — Telehealth: Payer: Self-pay | Admitting: Gastroenterology

## 2018-02-27 NOTE — Telephone Encounter (Signed)
The pt wanted to reschedule her 2nd banding appt to January.  She had a large hard stool over the weekend and had BRBPR.  No further bleeding or other symptoms. Also, she fell over the weekend and injured her tailbone.  It is too painful for her to sit or drive at this time.  I advised her to call her PCP and make them aware so that she can be evaluated.  Banding was rescheduled to January.  She will call with any other concerns or problems.

## 2018-03-01 DIAGNOSIS — M5136 Other intervertebral disc degeneration, lumbar region: Secondary | ICD-10-CM | POA: Diagnosis not present

## 2018-03-01 DIAGNOSIS — M5137 Other intervertebral disc degeneration, lumbosacral region: Secondary | ICD-10-CM | POA: Diagnosis not present

## 2018-03-01 DIAGNOSIS — M9903 Segmental and somatic dysfunction of lumbar region: Secondary | ICD-10-CM | POA: Diagnosis not present

## 2018-03-01 DIAGNOSIS — M545 Low back pain: Secondary | ICD-10-CM | POA: Diagnosis not present

## 2018-03-03 NOTE — Telephone Encounter (Signed)
Patient calling back stating she has not had a BM since last talking to the nurse and wants advice from [place]. Patient states she has been taking all her medications Dr.A prescribed as well as fiber gummies but does not know what else to do.

## 2018-03-03 NOTE — Telephone Encounter (Signed)
She will call back on Monday to update.

## 2018-03-03 NOTE — Telephone Encounter (Signed)
Amitiza 24 mcg BID

## 2018-03-03 NOTE — Telephone Encounter (Signed)
The pt has been taking her Amitiza as prescribed as well as fiber and increased her water.  She will take 2 doses of miralax twice daily until she has a BM and titrate as needed and keep appt as scheduled in January

## 2018-03-05 ENCOUNTER — Other Ambulatory Visit: Payer: Self-pay | Admitting: Nurse Practitioner

## 2018-03-05 ENCOUNTER — Other Ambulatory Visit: Payer: Self-pay | Admitting: Medical

## 2018-03-07 ENCOUNTER — Other Ambulatory Visit: Payer: Self-pay | Admitting: *Deleted

## 2018-03-07 MED ORDER — SUMATRIPTAN SUCCINATE 100 MG PO TABS: ORAL_TABLET | ORAL | 9 refills | Status: DC

## 2018-03-08 DIAGNOSIS — D225 Melanocytic nevi of trunk: Secondary | ICD-10-CM | POA: Diagnosis not present

## 2018-03-08 DIAGNOSIS — D2371 Other benign neoplasm of skin of right lower limb, including hip: Secondary | ICD-10-CM | POA: Diagnosis not present

## 2018-03-08 DIAGNOSIS — L821 Other seborrheic keratosis: Secondary | ICD-10-CM | POA: Diagnosis not present

## 2018-03-08 DIAGNOSIS — D2271 Melanocytic nevi of right lower limb, including hip: Secondary | ICD-10-CM | POA: Diagnosis not present

## 2018-03-08 DIAGNOSIS — L814 Other melanin hyperpigmentation: Secondary | ICD-10-CM | POA: Diagnosis not present

## 2018-03-08 DIAGNOSIS — L570 Actinic keratosis: Secondary | ICD-10-CM | POA: Diagnosis not present

## 2018-03-08 DIAGNOSIS — L565 Disseminated superficial actinic porokeratosis (DSAP): Secondary | ICD-10-CM | POA: Diagnosis not present

## 2018-03-08 DIAGNOSIS — Z85828 Personal history of other malignant neoplasm of skin: Secondary | ICD-10-CM | POA: Diagnosis not present

## 2018-03-08 DIAGNOSIS — D2262 Melanocytic nevi of left upper limb, including shoulder: Secondary | ICD-10-CM | POA: Diagnosis not present

## 2018-03-08 DIAGNOSIS — D2272 Melanocytic nevi of left lower limb, including hip: Secondary | ICD-10-CM | POA: Diagnosis not present

## 2018-03-08 DIAGNOSIS — D2261 Melanocytic nevi of right upper limb, including shoulder: Secondary | ICD-10-CM | POA: Diagnosis not present

## 2018-03-14 ENCOUNTER — Encounter: Payer: Self-pay | Admitting: Medical

## 2018-03-30 ENCOUNTER — Ambulatory Visit: Payer: Medicare HMO | Admitting: Gastroenterology

## 2018-03-30 ENCOUNTER — Encounter: Payer: Self-pay | Admitting: Gastroenterology

## 2018-03-30 VITALS — BP 140/80 | HR 80 | Ht 66.5 in | Wt 238.5 lb

## 2018-03-30 DIAGNOSIS — K642 Third degree hemorrhoids: Secondary | ICD-10-CM | POA: Diagnosis not present

## 2018-03-30 NOTE — Patient Instructions (Addendum)
Normal BMI (Body Mass Index- based on height and weight) is between 19 and 25. Your BMI today is Body mass index is 37.92 kg/m. Marland Kitchen Please consider follow up  regarding your BMI with your Primary Care Provider.  HEMORRHOID BANDING PROCEDURE    FOLLOW-UP CARE   1. The procedure you have had should have been relatively painless since the banding of the area involved does not have nerve endings and there is no pain sensation.  The rubber band cuts off the blood supply to the hemorrhoid and the band may fall off as soon as 48 hours after the banding (the band may occasionally be seen in the toilet bowl following a bowel movement). You may notice a temporary feeling of fullness in the rectum which should respond adequately to plain Tylenol or Motrin.  2. Following the banding, avoid strenuous exercise that evening and resume full activity the next day.  A sitz bath (soaking in a warm tub) or bidet is soothing, and can be useful for cleansing the area after bowel movements.     3. To avoid constipation, take two tablespoons of natural wheat bran, natural oat bran, flax, Benefiber or any over the counter fiber supplement and increase your water intake to 7-8 glasses daily.    4. Unless you have been prescribed anorectal medication, do not put anything inside your rectum for two weeks: No suppositories, enemas, fingers, etc.  5. Occasionally, you may have more bleeding than usual after the banding procedure.  This is often from the untreated hemorrhoids rather than the treated one.  Don't be concerned if there is a tablespoon or so of blood.  If there is more blood than this, lie flat with your bottom higher than your head and apply an ice pack to the area. If the bleeding does not stop within a half an hour or if you feel faint, call our office at [phone] or go to the emergency room.  6. Problems are not common; however, if there is a substantial amount of bleeding, severe pain, chills, fever or  difficulty passing urine (very rare) or other problems, you should call us at [phone] or report to the nearest emergency room.  7. Do not stay seated continuously for more than 2-3 hours for a day or two after the procedure.  Tighten your buttock muscles 10-15 times every two hours and take 10-15 deep breaths every 1-2 hours.  Do not spend more than a few minutes on the toilet if you cannot empty your bowel; instead re-visit the toilet at a later time.  Your next banding appointment is on Wednesday, 04-19-18 at 4:00pm.  Please let us know if you are unable to keep this appoitment.  We are giving you samples of Trulance today.  Take once a day.   Thank you for entrusting me with your care and for choosing [facility], Dr. [place] Cellar

## 2018-03-30 NOTE — Progress Notes (Signed)
63 y/o female here for hemorrhoid banding. Colonoscopy done earlier this year showing hemorrhoids. She has symptoms of pruritus and irritation, and prolapse (grade III). She otherwise has been on Amitiza 67mcg BID and it has helped her constipation initially. Also placed on buspirone 7.5mg  BID which has helped her dyspepsia and anxiety.   She underwent banding of LL hemorrhoid on 02/13/2018. About 2 weeks later she had an episode of rectal bleeding - she had not had any symptoms prior to then, and none since then. Overall, symptoms of hemorroids reduced since the initial banding however having issues with constipation again. Amitiza had worked for her, but not as well as Programmer, systems. She was not able to get this covered by insurance. If she does not go routinely, she has had episodes of diarrhea with cramping, and we discussed options. She wished to proceed with further banding treatment today after discussion of options.  PROCEDURE NOTE: The patient presents with symptomatic grade III  hemorrhoids, requesting rubber band ligation of his/her hemorrhoidal disease.  All risks, benefits and alternative forms of therapy were described and informed consent was obtained.   The anorectum was pre-medicated with 0.125% nitroglycerin The decision was made to band the RP internal hemorrhoid, and the [place] was used to perform band ligation without complication.  Digital anorectal examination was then performed to assure proper positioning of the band, and to adjust the banded tissue as required. The patient was discharged home without pain or other issues.  Dietary and behavioral recommendations were given and along with follow-up instructions.     The following adjunctive treatments were recommended: Hold Amitiza 3 week sample of Trulance given as this worked better for her Continue Miralax once daily to twice daily PRN  The patient will return in 2-4 weeks for  follow-up and possible additional  banding as required. No complications were encountered and the patient tolerated the procedure well.  Clarion Cellar, MD [facility]

## 2018-04-06 ENCOUNTER — Other Ambulatory Visit: Payer: Self-pay | Admitting: Medical

## 2018-04-16 ENCOUNTER — Other Ambulatory Visit: Payer: Self-pay | Admitting: Medical

## 2018-04-17 NOTE — Telephone Encounter (Signed)
Is this ok to refill?  

## 2018-04-17 NOTE — Telephone Encounter (Signed)
Make sure she is taking this dose and wants 3 month supply (#270 pills at a time)

## 2018-04-18 NOTE — Telephone Encounter (Signed)
Spoke to patient and she is still taking the same dosage and she did want a 90 day supply.

## 2018-04-18 NOTE — Telephone Encounter (Signed)
lmom asking patient to call and clarify dose and that she wants 90 day supply sent to pharmacy.

## 2018-04-19 ENCOUNTER — Encounter: Payer: Self-pay | Admitting: Gastroenterology

## 2018-04-19 ENCOUNTER — Ambulatory Visit: Payer: Medicare HMO | Admitting: Gastroenterology

## 2018-04-19 VITALS — BP 118/70 | HR 76 | Ht 66.5 in | Wt 238.0 lb

## 2018-04-19 DIAGNOSIS — K642 Third degree hemorrhoids: Secondary | ICD-10-CM

## 2018-04-19 NOTE — Patient Instructions (Signed)
If you are age 65 or older, your body mass index should be between 23-30. Your Body mass index is 37.84 kg/m. If this is out of the aforementioned range listed, please consider follow up with your Primary Care Provider.  If you are age 6 or younger, your body mass index should be between 19-25. Your Body mass index is 37.84 kg/m. If this is out of the aformentioned range listed, please consider follow up with your Primary Care Provider.   HEMORRHOID BANDING PROCEDURE    FOLLOW-UP CARE   1. The procedure you have had should have been relatively painless since the banding of the area involved does not have nerve endings and there is no pain sensation.  The rubber band cuts off the blood supply to the hemorrhoid and the band may fall off as soon as 48 hours after the banding (the band may occasionally be seen in the toilet bowl following a bowel movement). You may notice a temporary feeling of fullness in the rectum which should respond adequately to plain Tylenol or Motrin.  2. Following the banding, avoid strenuous exercise that evening and resume full activity the next day.  A sitz bath (soaking in a warm tub) or bidet is soothing, and can be useful for cleansing the area after bowel movements.     3. To avoid constipation, take two tablespoons of natural wheat bran, natural oat bran, flax, Benefiber or any over the counter fiber supplement and increase your water intake to 7-8 glasses daily.    4. Unless you have been prescribed anorectal medication, do not put anything inside your rectum for two weeks: No suppositories, enemas, fingers, etc.  5. Occasionally, you may have more bleeding than usual after the banding procedure.  This is often from the untreated hemorrhoids rather than the treated one.  Don't be concerned if there is a tablespoon or so of blood.  If there is more blood than this, lie flat with your bottom higher than your head and apply an ice pack to the area. If the bleeding  does not stop within a half an hour or if you feel faint, call our office at [phone] or go to the emergency room.  6. Problems are not common; however, if there is a substantial amount of bleeding, severe pain, chills, fever or difficulty passing urine (very rare) or other problems, you should call us at [phone] or report to the nearest emergency room.  7. Do not stay seated continuously for more than 2-3 hours for a day or two after the procedure.  Tighten your buttock muscles 10-15 times every two hours and take 10-15 deep breaths every 1-2 hours.  Do not spend more than a few minutes on the toilet if you cannot empty your bowel; instead re-visit the toilet at a later time.    We have given you samples of the following medication to take: Trulance 3mg : Take one daily  Thank you for entrusting me with your care and for choosing [employer], Dr.  Cellar

## 2018-04-19 NOTE — Progress Notes (Signed)
63 y/o female here for hemorrhoid banding. Colonoscopy done earlier this year showing hemorrhoids. She has symptoms of pruritus and irritation, and prolapse (grade III). She otherwise has been on Amitiza 53mcg BID and it has helped her constipation initially. Also placed on buspirone 7.5mg  BID which has helped her dyspepsia and anxiety.   She underwent banding of LL hemorrhoid on 02/13/2018 and RP banded on 03/30/18. Overall between that and sample of Trulance she has been doing a bit better. She tolerated her last banding well. She thinks cost for Trulance is quite high - the samples have worked quite well for her. She has not done well with Amitiza or Linzess. She wanted to proceed with banding today, her last scheduled.   PROCEDURE NOTE: The patient presents with symptomatic grade III  hemorrhoids, requesting rubber band ligation of his/her hemorrhoidal disease.  All risks, benefits and alternative forms of therapy were described and informed consent was obtained.   The anorectum was pre-medicated with 0.125% nitroglycerin The decision was made to band the RA internal hemorrhoid, and the [place] was used to perform band ligation without complication.  Digital anorectal examination was then performed to assure proper positioning of the band, and to adjust the banded tissue as required.  The patient was discharged home without pain or other issues.  Dietary and behavioral recommendations were given and along with follow-up instructions.     The following adjunctive treatments were recommended: Continue Trulance for constipation, will appeal with insurance or use another on their formulary if appropriate  The patient will return as needed for follow up. No complications were encountered and the patient tolerated the procedure well.  [place] Cellar, MD [facility]

## 2018-05-17 ENCOUNTER — Telehealth: Payer: Self-pay | Admitting: Gastroenterology

## 2018-05-17 NOTE — Telephone Encounter (Signed)
Prior Auth submitted for Trulance 3mg  via CoverMyMed. Pt notified

## 2018-05-18 MED ORDER — PLECANATIDE 3 MG PO TABS: 3.0000 mg | ORAL_TABLET | Freq: Every day | ORAL | 3 refills | Status: DC

## 2018-05-18 NOTE — Telephone Encounter (Signed)
Pharmacy called and they state that there is nothing in their system for this patient. Patient wants to refill sent. [phone]

## 2018-05-18 NOTE — Telephone Encounter (Signed)
Prior authorization Approved for Trulance 3mg .  OK to send new Rx for pt? If so, please indicated # and refills. Thank you.

## 2018-05-18 NOTE — Telephone Encounter (Signed)
Thanks Jan. #90, RF#3

## 2018-05-18 NOTE — Telephone Encounter (Signed)
Script sent  

## 2018-05-26 ENCOUNTER — Telehealth: Payer: Self-pay | Admitting: Gastroenterology

## 2018-05-26 NOTE — Telephone Encounter (Signed)
[facility] calling regarding Trulance copay is almost $500, pt wants to know if there is something more affordable.

## 2018-05-28 ENCOUNTER — Other Ambulatory Visit: Payer: Self-pay | Admitting: Nurse Practitioner

## 2018-05-29 NOTE — Telephone Encounter (Signed)
She may need to contact her insurance company to see what they will approve. She should let them know she failed linzess and amitiza, and high dose Miralax. I'm not sure if Trulance has patient assistance she can apply for. We could try Motegrity, I can't recall if we gave her a sample of that already. If not, can give her a sample and see if that would get approved. We may have samples of Trulance she can use until this is figured out. Thanks

## 2018-05-29 NOTE — Telephone Encounter (Signed)
Called and LM for pt.  Has she tried Motegrity? If not and insurance will cover we could give her a sample to try. We have samples of Trulance but we can not supply her with ongoing samples for management of her constipation. Asked her to call me back about picking up samples.

## 2018-05-29 NOTE — Telephone Encounter (Signed)
Dr. Havery Moros - looks like pt has tried and failed Linzess and Amitiza.  Trulance is effective and was approved but is too expensive.  She is looking for another suggestion.  Please advise

## 2018-05-29 NOTE — Telephone Encounter (Signed)
Pt stated that she has spoken to insurance and that they only approve Trulance.  Pt cannot afford the drug and asked for samples.  She requested a CB.

## 2018-05-30 NOTE — Telephone Encounter (Signed)
Called and LM for pt that samples of Motegrity will be left up front for her to pick up.

## 2018-05-30 NOTE — Telephone Encounter (Signed)
Pt return call. She did state that you can call the motegrity in to pharma. B/c she hasnt taken that one before if you like.

## 2018-06-06 ENCOUNTER — Ambulatory Visit: Payer: Medicare HMO | Admitting: Adult Health

## 2018-06-06 ENCOUNTER — Other Ambulatory Visit: Payer: Self-pay | Admitting: Medical

## 2018-06-22 ENCOUNTER — Other Ambulatory Visit: Payer: Self-pay | Admitting: Gastroenterology

## 2018-07-04 ENCOUNTER — Other Ambulatory Visit: Payer: Self-pay | Admitting: Gastroenterology

## 2018-07-04 ENCOUNTER — Other Ambulatory Visit: Payer: Self-pay | Admitting: Medical

## 2018-07-04 NOTE — Telephone Encounter (Signed)
Is this ok to refill?  

## 2018-08-02 ENCOUNTER — Other Ambulatory Visit: Payer: Self-pay | Admitting: Medical

## 2018-08-22 ENCOUNTER — Telehealth: Payer: Self-pay | Admitting: *Deleted

## 2018-08-22 ENCOUNTER — Ambulatory Visit (INDEPENDENT_AMBULATORY_CARE_PROVIDER_SITE_OTHER): Payer: Medicare HMO | Admitting: Neurology

## 2018-08-22 ENCOUNTER — Encounter: Payer: Self-pay | Admitting: Neurology

## 2018-08-22 ENCOUNTER — Telehealth: Payer: Self-pay | Admitting: Neurology

## 2018-08-22 ENCOUNTER — Telehealth: Payer: Self-pay

## 2018-08-22 ENCOUNTER — Other Ambulatory Visit: Payer: Self-pay

## 2018-08-22 VITALS — BP 142/61 | HR 68 | Temp 97.8°F | Ht 66.5 in | Wt 241.5 lb

## 2018-08-22 DIAGNOSIS — IMO0002 Reserved for concepts with insufficient information to code with codable children: Secondary | ICD-10-CM | POA: Insufficient documentation

## 2018-08-22 DIAGNOSIS — G43709 Chronic migraine without aura, not intractable, without status migrainosus: Secondary | ICD-10-CM

## 2018-08-22 DIAGNOSIS — R52 Pain, unspecified: Secondary | ICD-10-CM | POA: Diagnosis not present

## 2018-08-22 DIAGNOSIS — E538 Deficiency of other specified B group vitamins: Secondary | ICD-10-CM | POA: Diagnosis not present

## 2018-08-22 DIAGNOSIS — R799 Abnormal finding of blood chemistry, unspecified: Secondary | ICD-10-CM | POA: Diagnosis not present

## 2018-08-22 MED ORDER — VERAPAMIL HCL ER 240 MG PO TBCR: 240.0000 mg | EXTENDED_RELEASE_TABLET | Freq: Every day | ORAL | 4 refills | Status: DC

## 2018-08-22 MED ORDER — VENLAFAXINE HCL 37.5 MG PO TABS: 37.5000 mg | ORAL_TABLET | Freq: Every day | ORAL | 4 refills | Status: DC

## 2018-08-22 MED ORDER — SUMATRIPTAN SUCCINATE 100 MG PO TABS: ORAL_TABLET | ORAL | 11 refills | Status: DC

## 2018-08-22 MED ORDER — TIZANIDINE HCL 4 MG PO TABS: 4.0000 mg | ORAL_TABLET | Freq: Four times a day (QID) | ORAL | 6 refills | Status: DC | PRN

## 2018-08-22 MED ORDER — ERENUMAB-AOOE 70 MG/ML ~~LOC~~ SOAJ: 70.0000 mg | SUBCUTANEOUS | 11 refills | Status: DC

## 2018-08-22 MED ORDER — ONDANSETRON 4 MG PO TBDP: 4.0000 mg | ORAL_TABLET | Freq: Three times a day (TID) | ORAL | 6 refills | Status: DC | PRN

## 2018-08-22 NOTE — Telephone Encounter (Signed)
PA approved by Holland Falling [phone]) through 11/22/2018.  Ref #[id number].

## 2018-08-22 NOTE — Progress Notes (Signed)
   History: Chronic intractable headache for 2 weeks, failed home remedy Bilateral occipital and trigeminal nerve block; trigger point injection of bilateral cervical and upper trapezius muscles for intractable headache  Bupivacaine 0.5% was injected on the scalp bilaterally at several locations:  -On the occipital area of the head, 3 injections each side, 0.5 cc per injection at the midpoint between the mastoid process and the occipital protuberance. 2 other injections were done one finger breadth from the initial injection, one at a 10 o'clock position and the other at a 2 o'clock position.  -2 injections of 0.5 cc were done in the temporal regions, 2 fingerbreadths above the tragus of the ear, with the second injection one fingerbreadth posteriorly to the first.  -2 injections were done on the brow, 1 in the medial brow and one over the supraorbital nerve notch, with 0.1 cc for each injection  -1 injection each side of 0.5 cc was done anterior to the tragus of the ear for a trigeminal ganglion injection  -0.5 cc was injected into bilateral upper trapezius and bilateral upper cervical paraspinals   The patient tolerated the injections well, no complications of the procedure were noted. Injections were made with a 27-gauge needle.

## 2018-08-22 NOTE — Telephone Encounter (Signed)
I have called patient, she has migraine headaches for two weeks now.  Tried and failed home remedy.  I have asked her to come in for nerve block.

## 2018-08-22 NOTE — Progress Notes (Signed)
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    She is also on polypharmacy treatment, including Effexor 37.5 mg daily, gabapentin 300 mg 3 times daily, BuSpar 7.5 mg twice daily  For abortive treatment, she has tried Imitrex, Maxalt with limited help   I personally reviewed MRI of the brain with and without contrast in March 2013, mild small vessel disease there was no acute abnormalities  She now has migraine headache days 15 days in 1 month, lateralized severe pounding headache with associated light, noise sensitivity,  REVIEW OF SYSTEMS: Full 14 system review of systems performed and notable only for those listed, all others are neg:    ALLERGIES: Allergies  Allergen Reactions  . Bee Venom Anaphylaxis    HOME MEDICATIONS: Outpatient Medications Prior to Visit  Medication Sig Dispense Refill  . acetaminophen (TYLENOL) 500 MG tablet Take 1,500 mg by mouth once as needed for mild pain or moderate pain.    .Marland KitchenALPRAZolam (XANAX) 0.5 MG tablet TAKE 1 TABLET BY MOUTH EVERYDAY AT BEDTIME 60 tablet 2  . AMBULATORY NON FORMULARY MEDICATION Medication Name: *Nitroglycerin 0.125% gel. Apply a pea size amount to your rectum three times daily x 6-8 weeks (Patient taking differently: Place 1 application rectally See  admin instructions. Medication Name: *Nitroglycerin 0.125% gel. Apply a pea size amount to your rectum three times daily x 6-8 weeks) 30 g 0  . B-D ULTRAFINE III SHORT PEN 31G X 8 MM MISC USE AS DIRECTED 100 each 0  . BIOTIN PO Take 1 capsule by mouth daily.    . busPIRone (BUSPAR) 7.5 MG tablet TAKE 1 TABLET (7.5 MG TOTAL) BY MOUTH 2 (TWO) TIMES DAILY. 180 tablet 1  . calcium carbonate ([employer]S CALCIUM) 1500 (600 Ca) MG TABS tablet TAKE 1 TABLET (600 MG TOTAL) BY MOUTH 2 (TWO) TIMES DAILY WITH A MEAL. 150 tablet 1  . carisoprodol (SOMA) 350 MG tablet TAKE 1 TABLET BY MOUTH TWICE A DAY AND AT BEDTIME AS NEEDED FOR MUSCLE SPASMS  0  . Cholecalciferol (VITAMIN D3) 250 MCG (10000 UT) TABS Take 1 tablet by mouth daily. 90 tablet 3  . dicyclomine (BENTYL) 10 MG capsule TAKE 1-2 CAPSULES (10-20 MG TOTAL) BY MOUTH EVERY 8 (EIGHT) HOURS AS NEEDED FOR SPASMS. 60 capsule 2  . diphenhydrAMINE (BENADRYL) 25 mg capsule Take 50 mg by mouth once as needed for itching or allergies.    .Marland KitchenEPINEPHrine 0.3 mg/0.3 mL IJ SOAJ injection Inject 0.3 mg into the muscle as needed.    . etodolac (LODINE) 400 MG tablet TAKE 1 TABLET BY MOUTH TWICE A DAY WITH FOOD AS NEEDED FOR PAIN  1  . gabapentin (NEURONTIN) 300 MG capsule TAKE 1 CAPSULE (300 MG TOTAL) BY MOUTH 3 (THREE) TIMES DAILY AS NEEDED. 270 capsule 2  . Galcanezumab-gnlm (EMGALITY) 120 MG/ML SOSY Inject  120 mg into the skin every 30 (thirty) days. 1 Syringe 8  . HYDROcodone-acetaminophen (NORCO) 7.5-325 MG per tablet Take 1 tablet by mouth daily as needed for moderate pain. Reported on 09/05/2015    . hydroxychloroquine (PLAQUENIL) 200 MG tablet Take 400 mg by mouth daily.  2  . liraglutide (VICTOZA) 18 MG/3ML SOPN Inject 0.3 mLs (1.8 mg total) into the skin at bedtime. 9 mL 5  . lisinopril (PRINIVIL,ZESTRIL) 20 MG tablet TAKE 1 TABLET (20 MG TOTAL) BY MOUTH DAILY. 90 tablet 3  . lubiprostone (AMITIZA) 24 MCG capsule Take 1 capsule (24 mcg total) by mouth 2 (two) times  daily with a meal. 60 capsule 3  . metFORMIN (GLUCOPHAGE) 500 MG tablet Take 1 tablet (500 mg total) by mouth daily with breakfast. 90 tablet 3  . omeprazole (PRILOSEC) 40 MG capsule TAKE 1 CAPSULE BY MOUTH EVERY DAY 90 capsule 0  . Plecanatide (TRULANCE) 3 MG TABS Take 3 mg by mouth daily. 90 tablet 3  . rosuvastatin (CRESTOR) 20 MG tablet TAKE 1 TABLET BY MOUTH EVERYDAY AT BEDTIME 90 tablet 3  . SUMAtriptan (IMITREX) 100 MG tablet Take one tablet onset of migraine. May repeat in 2 hours if headache persists or recurs (max of 2 in 24hours) 9 tablet 9  . venlafaxine (EFFEXOR) 37.5 MG tablet TAKE 1 TABLET BY MOUTH EVERY DAY 30 tablet 3  . verapamil (CALAN-SR) 240 MG CR tablet Take 1 tablet (240 mg total) by mouth at bedtime. 90 tablet 3   No facility-administered medications prior to visit.     PAST MEDICAL HISTORY: Past Medical History:  Diagnosis Date  . Chronic constipation   . Cluster headache syndrome 04/27/13  . Colon polyp 06/11/2011   Most recent 04/23/08, no polyp--mild diverticulosis.  Repeat 10 yrs.  . Depression with anxiety 02/22/2008   Qualifier: Diagnosis of  By: Nolon Rod CMA (AAMA), Robin    . Diverticulosis   . GERD (gastroesophageal reflux disease) 06/11/2011  . Hypertension    Recently diagnosed  . Migraine syndrome   . Osteopenia 11/2016   bone density. plan repeat in 11/2018  . Overweight(278.02) 06/11/2011  . PUD (peptic ulcer disease) 04/23/08   Multiple antrum ulcers; h pylori neg  . Rheumatoid arthritis(714.0)     PAST SURGICAL HISTORY: Past Surgical History:  Procedure Laterality Date  . ABDOMINAL HYSTERECTOMY    . APPENDECTOMY    . BACK SURGERY     3 times  . CARDIOVASCULAR STRESS TEST  06/22/11   Normal; EF 65%  . CHOLECYSTECTOMY    . COLONOSCOPY  04/23/2008   mild diverticulosis, internal hemorrhoids; Dr. Erskine Emery  . LIVER BIOPSY  03/2007   Chronic active hepatitis w/fibrosis--related to hx of methotrexate treatment.  Marland Kitchen SKIN BIOPSY  08/16/2017    superficial basal cell carcinoma left inferior medial anterior tibia     FAMILY HISTORY: Family History  Problem Relation Age of Onset  . Diabetes Mother   . Stroke Mother   . Arthritis Father   . Hypertension Father   . Atrial fibrillation Father   . Alcohol abuse Sister   . Cancer Sister        mesothelioma  . Heart disease Sister        cardiomegaly  . Hypertension Sister   . Pulmonary embolism Sister   . Hypertension Brother   . Polycystic ovary syndrome Daughter   . Heart disease Maternal Grandmother   . Heart disease Maternal Grandfather   . Colon cancer Paternal Uncle   .  Esophageal cancer Neg Hx   . Stomach cancer Neg Hx     SOCIAL HISTORY: Social History   Socioeconomic History  . Marital status: Married    Spouse name: Not on file  . Number of children: 1  . Years of education: Not on file  . Highest education level: Not on file  Occupational History  . Occupation: retired on disability  Social Needs  . Financial resource strain: Not on file  . Food insecurity:    Worry: Not on file    Inability: Not on file  . Transportation needs:    Medical: Not on file    Non-medical: Not on file  Tobacco Use  . Smoking status: Never Smoker  . Smokeless tobacco: Never Used  Substance and Sexual Activity  . Alcohol use: Yes    Comment: Occasional  . Drug use: No  . Sexual activity: Not Currently    Partners: Male  Lifestyle  . Physical activity:    Days per week: Not on file    Minutes per session: Not on file  . Stress: Not on file  Relationships  . Social connections:    Talks on phone: Not on file    Gets together: Not on file    Attends religious service: Not on file    Active member of club or organization: Not on file    Attends meetings of clubs or organizations: Not on file    Relationship status: Not on file  . Intimate partner violence:    Fear of current or ex partner: Not on file    Emotionally abused: Not on file    Physically abused:  Not on file    Forced sexual activity: Not on file  Other Topics Concern  . Not on file  Social History Narrative   Married, caregiver, limited exercise.   01/2018.     PHYSICAL EXAM  Vitals:   08/22/18 1346  BP: (!) 142/61  Pulse: 68  Temp: 97.8 F (36.6 C)  Weight: 241 lb 8 oz (109.5 kg)  Height: 5' 6.5" (1.689 m)   Body mass index is 38.4 kg/m.  Generalized: Well developed, Obese female in no acute distress  Head: normocephalic and atraumatic,. Oropharynx benign  Neck: Supple,  Musculoskeletal: No deformity   Neurological examination   Mentation: Alert oriented to time, place, history taking. Attention span and concentration appropriate. Recent and remote memory intact.  Follows all commands speech and language fluent.   Cranial nerve II-XII: Pupils were equal round reactive to light extraocular movements were full, visual field were full on confrontational test. Facial sensation and strength were normal. hearing was intact to finger rubbing bilaterally. Uvula tongue midline. head turning and shoulder shrug were normal and symmetric.Tongue protrusion into cheek strength was normal. Motor: normal bulk and tone, full strength in the BUE, BLE,  Sensory: normal and symmetric to light touch,  Coordination: finger-nose-finger, heel-to-shin bilaterally, no dysmetria Reflexes: Symmetric upper and lower plantar responses were flexor bilaterally. Gait and Station: Rising up from seated position without assistance, normal stance,  moderate stride,    DIAGNOSTIC DATA (LABS, IMAGING, TESTING) - I reviewed patient records, labs, notes, testing and imaging myself where available.  Lab Results  Component Value Date   WBC 7.0 05/19/2016   HGB 12.9 05/19/2016   HCT 38.5 05/19/2016   MCV 90.4 05/19/2016   PLT 287 05/19/2016      Component Value Date/Time   NA 145 (H) 01/23/2018 1453   K 4.8 01/23/2018  1453   CL 102 01/23/2018 1453   CO2 23 01/23/2018 1453   GLUCOSE 83  01/23/2018 1453   GLUCOSE 86 12/16/2016 1244   BUN 15 01/23/2018 1453   CREATININE 1.32 (H) 01/23/2018 1453   CREATININE 0.95 12/16/2016 1244   CALCIUM 10.3 01/23/2018 1453   PROT 6.9 07/20/2017 1406   ALBUMIN 4.2 07/20/2017 1406   AST 15 07/20/2017 1406   ALT 17 07/20/2017 1406   ALKPHOS 58 07/20/2017 1406   BILITOT <0.2 07/20/2017 1406   GFRNONAA 43 (L) 01/23/2018 1453   GFRAA 49 (L) 01/23/2018 1453   Lab Results  Component Value Date   CHOL 122 07/20/2017   HDL 44 07/20/2017   LDLCALC 47 07/20/2017   TRIG 153 (H) 07/20/2017   CHOLHDL 2.8 07/20/2017   Lab Results  Component Value Date   HGBA1C 6.4 (H) 01/23/2018    Lab Results  Component Value Date   TSH 1.76 07/17/2015    ASSESSMENT AND PLAN  Chronic migraine headaches  Laboratory evaluations including ESR TSH to rule out temporal arteritis  Continue preventive medication Effexor XR 37.5 mg, gabapentin 300 mg 3 times daily, verapamil CR 240 mg qhs.  Imitrex as needed, may combine it with Zofran, Aleve, tizanidine    Marcial Pacas, M.D. Ph.D.  [facility] [place], [place] [address] Phone: [phone] Fax:      [phone]

## 2018-08-22 NOTE — Telephone Encounter (Signed)
PT was last seen by Hoyle Sauer Np In 11/2017. Pt cancel with JEssica NP in March due to COVID 19. PT left message in infusion vm that she wants an infusion for a headache. Message will be sent to [facility] NP for review.

## 2018-08-22 NOTE — Telephone Encounter (Signed)
PA for Aimovig submitted through [email or web address] (key: [id number]).  The patient has coverage with [employer] (ID#[id number]).  Decision pending.

## 2018-08-22 NOTE — Telephone Encounter (Signed)
She has been added to the schedule

## 2018-08-22 NOTE — Telephone Encounter (Signed)
She has an appointment with Dr. Krista Blue today

## 2018-08-23 ENCOUNTER — Telehealth: Payer: Self-pay | Admitting: Neurology

## 2018-08-23 LAB — COMPREHENSIVE METABOLIC PANEL
ALT: 18 IU/L (ref 0–32)
AST: 16 IU/L (ref 0–40)
Albumin/Globulin Ratio: 1.9 (ref 1.2–2.2)
Albumin: 4.5 g/dL (ref 3.8–4.8)
Alkaline Phosphatase: 60 IU/L (ref 39–117)
BUN/Creatinine Ratio: 18 (ref 12–28)
BUN: 19 mg/dL (ref 8–27)
Bilirubin Total: <0.2 mg/dL (ref 0.0–1.2)
CO2: 23 mmol/L (ref 20–29)
Calcium: 9.9 mg/dL (ref 8.7–10.3)
Chloride: 105 mmol/L (ref 96–106)
Creatinine, Ser: 1.06 mg/dL — ABNORMAL HIGH (ref 0.57–1.00)
GFR calc Af Amer: 64 mL/min/{1.73_m2} (ref >59)
GFR calc non Af Amer: 56 mL/min/{1.73_m2} — ABNORMAL LOW (ref >59)
Globulin, Total: 2.4 g/dL (ref 1.5–4.5)
Glucose: 123 mg/dL — ABNORMAL HIGH (ref 65–99)
Potassium: 5.1 mmol/L (ref 3.5–5.2)
Sodium: 144 mmol/L (ref 134–144)
Total Protein: 6.9 g/dL (ref 6.0–8.5)

## 2018-08-23 LAB — CBC WITH DIFFERENTIAL
Basophils Absolute: 0.1 10*3/uL (ref 0.0–0.2)
Basos: 1 %
EOS (ABSOLUTE): 0.5 10*3/uL — ABNORMAL HIGH (ref 0.0–0.4)
Eos: 6 %
Hematocrit: 31.4 % — ABNORMAL LOW (ref 34.0–46.6)
Hemoglobin: 10.6 g/dL — ABNORMAL LOW (ref 11.1–15.9)
Immature Grans (Abs): 0 10*3/uL (ref 0.0–0.1)
Immature Granulocytes: 0 %
Lymphocytes Absolute: 2.6 10*3/uL (ref 0.7–3.1)
Lymphs: 31 %
MCH: 27.7 pg (ref 26.6–33.0)
MCHC: 33.8 g/dL (ref 31.5–35.7)
MCV: 82 fL (ref 79–97)
Monocytes Absolute: 0.9 10*3/uL (ref 0.1–0.9)
Monocytes: 11 %
Neutrophils Absolute: 4.3 10*3/uL (ref 1.4–7.0)
Neutrophils: 51 %
RBC: 3.82 x10E6/uL (ref 3.77–5.28)
RDW: 15.7 % — ABNORMAL HIGH (ref 11.7–15.4)
WBC: 8.4 10*3/uL (ref 3.4–10.8)

## 2018-08-23 LAB — RPR: RPR Ser Ql: NONREACTIVE

## 2018-08-23 LAB — C-REACTIVE PROTEIN: CRP: 3 mg/L (ref 0–10)

## 2018-08-23 LAB — SEDIMENTATION RATE: Sed Rate: 39 mm/hr (ref 0–40)

## 2018-08-23 LAB — VITAMIN B12: Vitamin B-12: 431 pg/mL (ref 232–1245)

## 2018-08-23 LAB — TSH: TSH: 1.72 u[IU]/mL (ref 0.450–4.500)

## 2018-08-23 NOTE — Telephone Encounter (Signed)
Please call patient, laboratory evaluation showed evidence of mild anemia with hemoglobin of 10.6, CMP showed creatinine of 1.06, with GFR of 56, she should increase water intake  Rest of the laboratory evaluation showed no significant abnormality,

## 2018-08-23 NOTE — Telephone Encounter (Signed)
Spoke to patient and notified her of the information below.  She verbalized understanding.

## 2018-08-28 ENCOUNTER — Ambulatory Visit: Payer: Self-pay | Admitting: Neurology

## 2018-08-31 DIAGNOSIS — Z6837 Body mass index (BMI) 37.0-37.9, adult: Secondary | ICD-10-CM | POA: Diagnosis not present

## 2018-08-31 DIAGNOSIS — M154 Erosive (osteo)arthritis: Secondary | ICD-10-CM | POA: Diagnosis not present

## 2018-08-31 DIAGNOSIS — G6289 Other specified polyneuropathies: Secondary | ICD-10-CM | POA: Diagnosis not present

## 2018-08-31 DIAGNOSIS — M25512 Pain in left shoulder: Secondary | ICD-10-CM | POA: Diagnosis not present

## 2018-08-31 DIAGNOSIS — G8929 Other chronic pain: Secondary | ICD-10-CM | POA: Diagnosis not present

## 2018-08-31 DIAGNOSIS — M2550 Pain in unspecified joint: Secondary | ICD-10-CM | POA: Diagnosis not present

## 2018-08-31 DIAGNOSIS — E669 Obesity, unspecified: Secondary | ICD-10-CM | POA: Diagnosis not present

## 2018-09-03 ENCOUNTER — Other Ambulatory Visit: Payer: Self-pay | Admitting: Medical

## 2018-09-04 NOTE — Telephone Encounter (Signed)
Needs 30mo f/u visit

## 2018-09-05 ENCOUNTER — Other Ambulatory Visit: Payer: Self-pay | Admitting: Medical

## 2018-09-05 ENCOUNTER — Telehealth: Payer: Self-pay | Admitting: Medical

## 2018-09-05 MED ORDER — ALPRAZOLAM 0.5 MG PO TABS: ORAL_TABLET | ORAL | 0 refills | Status: DC

## 2018-09-05 NOTE — Telephone Encounter (Signed)
done

## 2018-09-05 NOTE — Telephone Encounter (Signed)
lmom for patient to call and schedule her 6 month follow up for additional refills.

## 2018-09-05 NOTE — Telephone Encounter (Signed)
Pt called and scheduled follow up appt  For 09/27/2018  She needs refill on alpraxolam

## 2018-09-06 ENCOUNTER — Ambulatory Visit: Payer: Medicare HMO | Admitting: Medical

## 2018-09-21 ENCOUNTER — Other Ambulatory Visit: Payer: Self-pay | Admitting: Family Medicine

## 2018-09-27 ENCOUNTER — Encounter: Payer: Self-pay | Admitting: Medical

## 2018-09-27 ENCOUNTER — Ambulatory Visit (INDEPENDENT_AMBULATORY_CARE_PROVIDER_SITE_OTHER): Payer: Medicare HMO | Admitting: Medical

## 2018-09-27 ENCOUNTER — Other Ambulatory Visit: Payer: Self-pay

## 2018-09-27 VITALS — BP 120/70 | HR 60 | Temp 98.6°F | Resp 16 | Ht 67.0 in | Wt 241.2 lb

## 2018-09-27 DIAGNOSIS — I10 Essential (primary) hypertension: Secondary | ICD-10-CM | POA: Diagnosis not present

## 2018-09-27 DIAGNOSIS — N289 Disorder of kidney and ureter, unspecified: Secondary | ICD-10-CM | POA: Diagnosis not present

## 2018-09-27 DIAGNOSIS — D649 Anemia, unspecified: Secondary | ICD-10-CM | POA: Diagnosis not present

## 2018-09-27 DIAGNOSIS — G43709 Chronic migraine without aura, not intractable, without status migrainosus: Secondary | ICD-10-CM

## 2018-09-27 DIAGNOSIS — R809 Proteinuria, unspecified: Secondary | ICD-10-CM

## 2018-09-27 DIAGNOSIS — E118 Type 2 diabetes mellitus with unspecified complications: Secondary | ICD-10-CM | POA: Diagnosis not present

## 2018-09-27 DIAGNOSIS — IMO0002 Reserved for concepts with insufficient information to code with codable children: Secondary | ICD-10-CM

## 2018-09-27 DIAGNOSIS — E785 Hyperlipidemia, unspecified: Secondary | ICD-10-CM | POA: Diagnosis not present

## 2018-09-27 DIAGNOSIS — Z79899 Other long term (current) drug therapy: Secondary | ICD-10-CM

## 2018-09-27 DIAGNOSIS — K635 Polyp of colon: Secondary | ICD-10-CM | POA: Diagnosis not present

## 2018-09-27 DIAGNOSIS — I872 Venous insufficiency (chronic) (peripheral): Secondary | ICD-10-CM | POA: Diagnosis not present

## 2018-09-27 DIAGNOSIS — R0989 Other specified symptoms and signs involving the circulatory and respiratory systems: Secondary | ICD-10-CM | POA: Diagnosis not present

## 2018-09-27 DIAGNOSIS — F419 Anxiety disorder, unspecified: Secondary | ICD-10-CM

## 2018-09-27 DIAGNOSIS — K5909 Other constipation: Secondary | ICD-10-CM | POA: Diagnosis not present

## 2018-09-27 DIAGNOSIS — F329 Major depressive disorder, single episode, unspecified: Secondary | ICD-10-CM

## 2018-09-27 DIAGNOSIS — F32A Depression, unspecified: Secondary | ICD-10-CM

## 2018-09-27 NOTE — Progress Notes (Signed)
Subjective: Chief Complaint  Patient presents with  . follow up    6 month follow up   Here for 27mo f/u /med check on diabetes, HTN, hyperlipidemia.  Lately has been dealing with husband.  She is his caregiver.   Recently he threatened to commit suicide.  He has been manipulative and controlling of late, wanting her to be around him full time.  Thus, she has not been exercising.  She is not currently checking glucose.  She is only doing the Victoza 2 days/week.  She says she is compliant with the rest of her medications.  She has been having more migraines of late.  She recently saw neurology and had several injections of her scalp for migraines.  Her usual neurologist Cecille Rubin just retired.  She saw her rheumatologist recently, decide about her labs done.  They asked that she have a blood flow study done on her legs.  They mention that she had anemia on her labs.  She does note having ongoing problems with hemorrhoids.  She saw gastro this past year had a colonoscopy.  No other aggravating or relieving factors. No other complaint.   Past Medical History:  Diagnosis Date  . Chronic constipation   . Cluster headache syndrome 04/27/13  . Colon polyp 06/11/2011   Most recent 04/23/08, no polyp--mild diverticulosis.  Repeat 10 yrs.  . Depression with anxiety 02/22/2008   Qualifier: Diagnosis of  By: Nolon Rod CMA (AAMA), Robin    . Diabetes mellitus without complication ([name])   . Diverticulosis   . GERD (gastroesophageal reflux disease) 06/11/2011  . Hypertension    Recently diagnosed  . Migraine syndrome   . Osteopenia 11/2016   bone density. plan repeat in 11/2018  . Overweight(278.02) 06/11/2011  . PUD (peptic ulcer disease) 04/23/08   Multiple antrum ulcers; h pylori neg  . Rheumatoid arthritis(714.0)    Current Outpatient Medications on File Prior to Visit  Medication Sig Dispense Refill  . acetaminophen (TYLENOL) 500 MG tablet Take 1,500 mg by mouth once as needed for mild pain or  moderate pain.    Marland Kitchen ALPRAZolam (XANAX) 0.5 MG tablet TAKE 1 TABLET BY MOUTH EVERYDAY AT BEDTIME 60 tablet 0  . AMBULATORY NON FORMULARY MEDICATION Medication Name: *Nitroglycerin 0.125% gel. Apply a pea size amount to your rectum three times daily x 6-8 weeks (Patient taking differently: Place 1 application rectally See admin instructions. Medication Name: *Nitroglycerin 0.125% gel. Apply a pea size amount to your rectum three times daily x 6-8 weeks) 30 g 0  . B-D ULTRAFINE III SHORT PEN 31G X 8 MM MISC USE AS DIRECTED 100 each 0  . BIOTIN PO Take 1 capsule by mouth daily.    . busPIRone (BUSPAR) 7.5 MG tablet TAKE 1 TABLET (7.5 MG TOTAL) BY MOUTH 2 (TWO) TIMES DAILY. 180 tablet 1  . calcium carbonate (CVS CALCIUM) 1500 (600 Ca) MG TABS tablet TAKE 1 TABLET (600 MG TOTAL) BY MOUTH 2 (TWO) TIMES DAILY WITH A MEAL. 150 tablet 1  . carisoprodol (SOMA) 350 MG tablet TAKE 1 TABLET BY MOUTH TWICE A DAY AND AT BEDTIME AS NEEDED FOR MUSCLE SPASMS  0  . Cholecalciferol (VITAMIN D3) 250 MCG (10000 UT) TABS Take 1 tablet by mouth daily. 90 tablet 3  . dicyclomine (BENTYL) 10 MG capsule TAKE 1-2 CAPSULES (10-20 MG TOTAL) BY MOUTH EVERY 8 (EIGHT) HOURS AS NEEDED FOR SPASMS. 60 capsule 2  . diphenhydrAMINE (BENADRYL) 25 mg capsule Take 50 mg by mouth once as  needed for itching or allergies.    Marland Kitchen EPINEPHrine 0.3 mg/0.3 mL IJ SOAJ injection Inject 0.3 mg into the muscle as needed.    Eduard Roux (AIMOVIG) 70 MG/ML SOAJ Inject 70 mg into the skin every 30 (thirty) days. 1 pen 11  . etodolac (LODINE) 400 MG tablet TAKE 1 TABLET BY MOUTH TWICE A DAY WITH FOOD AS NEEDED FOR PAIN  1  . gabapentin (NEURONTIN) 300 MG capsule TAKE 1 CAPSULE (300 MG TOTAL) BY MOUTH 3 (THREE) TIMES DAILY AS NEEDED. 270 capsule 2  . Galcanezumab-gnlm (EMGALITY) 120 MG/ML SOSY Inject 120 mg into the skin every 30 (thirty) days. 1 Syringe 8  . HYDROcodone-acetaminophen (NORCO) 7.5-325 MG per tablet Take 1 tablet by mouth daily as needed for  moderate pain. Reported on 09/05/2015    . hydroxychloroquine (PLAQUENIL) 200 MG tablet Take 400 mg by mouth daily.  2  . liraglutide (VICTOZA) 18 MG/3ML SOPN Inject 0.3 mLs (1.8 mg total) into the skin at bedtime. 9 mL 5  . lisinopril (ZESTRIL) 20 MG tablet TAKE 1 TABLET BY MOUTH EVERY DAY 90 tablet 0  . metFORMIN (GLUCOPHAGE) 500 MG tablet Take 1 tablet (500 mg total) by mouth daily with breakfast. 90 tablet 3  . omeprazole (PRILOSEC) 40 MG capsule TAKE 1 CAPSULE BY MOUTH EVERY DAY 90 capsule 0  . ondansetron (ZOFRAN ODT) 4 MG disintegrating tablet Take 1 tablet (4 mg total) by mouth every 8 (eight) hours as needed. 20 tablet 6  . Plecanatide (TRULANCE) 3 MG TABS Take 3 mg by mouth daily. 90 tablet 3  . rosuvastatin (CRESTOR) 20 MG tablet TAKE 1 TABLET BY MOUTH EVERYDAY AT BEDTIME 90 tablet 3  . SUMAtriptan (IMITREX) 100 MG tablet Take one tablet onset of migraine. May repeat in 2 hours if headache persists or recurs (max of 2 in 24hours) 12 tablet 11  . tiZANidine (ZANAFLEX) 4 MG tablet Take 1 tablet (4 mg total) by mouth every 6 (six) hours as needed for muscle spasms. 30 tablet 6  . venlafaxine (EFFEXOR) 37.5 MG tablet Take 1 tablet (37.5 mg total) by mouth daily. 90 tablet 4  . verapamil (CALAN-SR) 240 MG CR tablet Take 1 tablet (240 mg total) by mouth at bedtime. 90 tablet 4  . lubiprostone (AMITIZA) 24 MCG capsule Take 1 capsule (24 mcg total) by mouth 2 (two) times daily with a meal. (Patient not taking: Reported on 09/27/2018) 60 capsule 3   No current facility-administered medications on file prior to visit.     Objective: BP 120/70   Pulse 60   Temp 98.6 F (37 C) (Temporal)   Resp 16   Ht 5\' 7"  (1.702 m)   Wt 241 lb 3.2 oz (109.4 kg)   SpO2 96%   BMI 37.78 kg/m   Wt Readings from Last 3 Encounters:  09/27/18 241 lb 3.2 oz (109.4 kg)  08/22/18 241 lb 8 oz (109.5 kg)  04/19/18 238 lb (108 kg)   BP Readings from Last 3 Encounters:  09/27/18 120/70  08/22/18 (!) 142/61   04/19/18 118/70   General appearence: alert, no distress, WD/WN,  Heart: RRR, normal S1, S2, no murmurs Lungs: CTA bilaterally, no wheezes, rhonchi, or rales No edema Neck: supple, nontneder, no mass, no lymphadenotomy, no thyromegaly   Diabetic Foot Exam - Simple   Simple Foot Form Visual Inspection See comments: Yes Sensation Testing Intact to touch and monofilament testing bilaterally: Yes Pulse Check See comments: Yes Comments 2+ dorsalis pedis pulse, barely palpable  posterior tibialis pulse, spider veins noticed of lower legs and feet, no obvious edema otherwise unremarkable the exam      Assessment: Encounter Diagnoses  Name Primary?  . Diabetes mellitus with complication ([place]) Yes  . Essential hypertension   . Anemia, unspecified type   . Hyperlipidemia, unspecified hyperlipidemia type   . Decreased pedal pulses   . Chronic migraine   . Chronic venous insufficiency   . Chronic constipation   . Polyp of colon, unspecified part of colon, unspecified type   . Renal insufficiency   . Anxiety and depression   . High risk medication use   . Microalbuminuria      Plan: Diabetes-I reviewed her recent labs in June listed in the computer.  We will do labs as below.  I advise she check her glucose readings daily fasting.  We discussed diet and exercise.  She needs to be walking regularly.  Since she is not using her Victoza as prescribed, we discussed maybe considering a different medication such as Ozempic or other option.  Continue rest of medications as usual.  Continue daily foot checks  Hypertension-continue current medications  Hyperlipidemia-continue current medications  We discussed her legs and feet and pulses, ABIs normal exam last year May 2019.  She does have decreased pulses.  We discussed possibly sending her for vein study and repeat screening.  She will consider and let me know  Regarding the recent drop in her hemoglobin, I will check an iron level  today.  I advise she follow-up with Dr. Havery Moros about hemorrhoids and the anemia.  She may need further treatment for hemorrhoids that may be bleeding  Follow-up pending labs  Jaskirat was seen today for follow up.  Diagnoses and all orders for this visit:  Diabetes mellitus with complication ([place]) -     Hemoglobin A1c  Essential hypertension  Anemia, unspecified type -     Iron  Hyperlipidemia, unspecified hyperlipidemia type -     Lipid panel  Decreased pedal pulses  Chronic migraine  Chronic venous insufficiency  Chronic constipation  Polyp of colon, unspecified part of colon, unspecified type  Renal insufficiency  Anxiety and depression  High risk medication use  Microalbuminuria

## 2018-09-28 ENCOUNTER — Other Ambulatory Visit: Payer: Self-pay | Admitting: Medical

## 2018-09-28 LAB — IRON: Iron: 42 ug/dL (ref 27–139)

## 2018-09-28 LAB — LIPID PANEL
Chol/HDL Ratio: 2.1 ratio (ref 0.0–4.4)
Cholesterol, Total: 109 mg/dL (ref 100–199)
HDL: 51 mg/dL (ref >39)
LDL Calculated: 28 mg/dL (ref 0–99)
Triglycerides: 148 mg/dL (ref 0–149)
VLDL Cholesterol Cal: 30 mg/dL (ref 5–40)

## 2018-09-28 LAB — HEMOGLOBIN A1C
Est. average glucose Bld gHb Est-mCnc: 148 mg/dL
Hgb A1c MFr Bld: 6.8 % — ABNORMAL HIGH (ref 4.8–5.6)

## 2018-09-28 MED ORDER — TAB-A-VITE/IRON PO TABS: 1.0000 | ORAL_TABLET | Freq: Every day | ORAL | 0 refills | Status: AC

## 2018-09-28 MED ORDER — OZEMPIC (1 MG/DOSE) 2 MG/1.5ML ~~LOC~~ SOPN: 1.0000 mg | PEN_INJECTOR | SUBCUTANEOUS | 5 refills | Status: DC

## 2018-09-28 MED ORDER — BD PEN NEEDLE SHORT U/F 31G X 8 MM MISC: 11 refills | Status: DC

## 2018-09-29 ENCOUNTER — Other Ambulatory Visit: Payer: Self-pay | Admitting: Medical

## 2018-09-29 DIAGNOSIS — I872 Venous insufficiency (chronic) (peripheral): Secondary | ICD-10-CM

## 2018-09-29 DIAGNOSIS — R609 Edema, unspecified: Secondary | ICD-10-CM

## 2018-09-29 DIAGNOSIS — E118 Type 2 diabetes mellitus with unspecified complications: Secondary | ICD-10-CM

## 2018-09-29 DIAGNOSIS — R0989 Other specified symptoms and signs involving the circulatory and respiratory systems: Secondary | ICD-10-CM

## 2018-10-16 ENCOUNTER — Telehealth: Payer: Self-pay

## 2018-10-16 NOTE — Telephone Encounter (Signed)
Error

## 2018-11-01 ENCOUNTER — Ambulatory Visit (INDEPENDENT_AMBULATORY_CARE_PROVIDER_SITE_OTHER): Payer: Medicare Other | Admitting: Family Medicine

## 2018-11-01 ENCOUNTER — Ambulatory Visit: Payer: Medicare HMO | Admitting: Family Medicine

## 2018-11-01 ENCOUNTER — Encounter: Payer: Self-pay | Admitting: Family Medicine

## 2018-11-01 ENCOUNTER — Other Ambulatory Visit: Payer: Self-pay

## 2018-11-01 VITALS — BP 142/86 | HR 77 | Temp 97.6°F | Wt 237.4 lb

## 2018-11-01 DIAGNOSIS — L255 Unspecified contact dermatitis due to plants, except food: Secondary | ICD-10-CM

## 2018-11-01 MED ORDER — TRIAMCINOLONE ACETONIDE 0.5 % EX CREA: 1.0000 "application " | TOPICAL_CREAM | Freq: Two times a day (BID) | CUTANEOUS | 0 refills | Status: DC

## 2018-11-01 MED ORDER — METHYLPREDNISOLONE SODIUM SUCC 125 MG IJ SOLR
125.0000 mg | Freq: Once | INTRAMUSCULAR | Status: AC
  Administered 2018-11-01: 125 mg via INTRAMUSCULAR

## 2018-11-01 NOTE — Progress Notes (Signed)
   Subjective:    Patient ID: Emma [name], female    DOB: [birth year], 63 y.o.   MRN: [id number]  HPI She is here for evaluation of scattered pruritic lesions from poison oak.  She thinks she got up from her dog as she does have lesions on her face, wrist, abdomen and upper back.  She has been using Benadryl 3 times per day which makes her drowsy.  She has had difficulty with sleep as well.   Review of Systems     Objective:   Physical Exam Scattered erythematous lesions several of them linear in nature of present on her face, abdomen, wrist, upper back.       Assessment & Plan:  Rhus dermatitis - Plan: triamcinolone cream (KENALOG) 0.5 %, methylPREDNISolone sodium succinate (SOLU-MEDROL) 125 mg/2 mL injection 125 mg,  She will continue to use Benadryl mainly at night.  Cool compresses to the area to help with the itching as well as cortisone cream.  Hopefully the shot will take care of her.

## 2018-11-01 NOTE — Patient Instructions (Signed)
Use cool compresses, Benadryl as needed and cortisone cream to the areas that itch a lot.

## 2018-11-14 ENCOUNTER — Encounter: Payer: Self-pay | Admitting: Family Medicine

## 2018-11-14 ENCOUNTER — Ambulatory Visit (INDEPENDENT_AMBULATORY_CARE_PROVIDER_SITE_OTHER): Payer: Medicare Other | Admitting: Family Medicine

## 2018-11-14 ENCOUNTER — Other Ambulatory Visit: Payer: Self-pay

## 2018-11-14 VITALS — BP 130/72 | HR 70 | Temp 98.7°F | Wt 238.6 lb

## 2018-11-14 DIAGNOSIS — L255 Unspecified contact dermatitis due to plants, except food: Secondary | ICD-10-CM | POA: Diagnosis not present

## 2018-11-14 MED ORDER — PREDNISONE 10 MG (21) PO TBPK: ORAL_TABLET | Freq: Every day | ORAL | 0 refills | Status: DC

## 2018-11-14 MED ORDER — HYDROXYZINE HCL 25 MG PO TABS: 25.0000 mg | ORAL_TABLET | Freq: Three times a day (TID) | ORAL | 0 refills | Status: DC | PRN

## 2018-11-14 NOTE — Progress Notes (Signed)
   Subjective:    Patient ID: Emma [name], female    DOB: [birth year], 63 y.o.   MRN: [id number]  HPI Chief Complaint  Patient presents with  . poison oak    poison oak, seen 2 weeks ago for this and given a shot, keeps coming back all over body   Here today with complaints of a scattered rash that is pruritic. States she has been exposed to poison ivy. Her dogs go into the woods and bring it in to her.   She was seen and treated by Dr. Redmond School approximately 2 weeks ago for the same. She was given a Solu-medrol injection at that time and prescribed topical triamcinolone.  Has been taking Benadryl at bedtime and a non drowsy antihistamine during the day. This does not control the itching.   Requests another steroid injection   States some areas have cleared up or improved but she has new areas now.   States itching is very bothersome.   Denies fever, chills, dizziness, chest pain, palpitations, shortness of breath, abdominal pain, N/V/D, urinary symptoms, LE edema.   Reviewed allergies, medications, past medical, surgical, family, and social history.   Review of Systems Pertinent positives and negatives in the history of present illness.     Objective:   Physical Exam BP 130/72   Pulse 70   Temp 98.7 F (37.1 C)   Wt 238 lb 9.6 oz (108.2 kg)   BMI 37.37 kg/m   Alert and oriented and in no acute distress. Diffuse erythematous pruritic lesions on her left lateral and anterior neck, abdomen, bilateral forearms and left upper leg. No sign of infection.       Assessment & Plan:  Rhus dermatitis - Plan: predniSONE (STERAPRED UNI-PAK 21 TAB) 10 MG (21) TBPK tablet, hydrOXYzine (ATARAX/VISTARIL) 25 MG tablet  She did get some relief from previous treatment for dermatitis but worsening now with new exposures. Will put her on a steroid dose pak and hydroxyzine. She will stop Benadryl. Use cool compresses.  Discussed potential side effects including drowsiness and elevated blood  sugars.  Recommend she keep her dog from area with poison ivy or have someone take care of the ivy. Follow up as needed.

## 2018-11-14 NOTE — Patient Instructions (Signed)
Stop the Benadryl and try using hydroxyzine for itching.  Be aware this medication is sedating.  Do not drive or drink alcohol with it. Continue taking cool showers and using cool compresses to help control itching.  Take the tapering steroid dose pack as discussed.  Be aware that steroids can cause flushing, elevate your blood sugars and interfere with your ability to sleep.  If you do have any of the side effects they should resolve shortly after stopping the medication. Make sure you are drinking plenty of water.   Try to avoid contact with any poison ivy plants, even through petting your dog   Poison Ivy Dermatitis Poison ivy dermatitis is inflammation of the skin that is caused by chemicals in the leaves of the poison ivy plant. The skin reaction often involves redness, swelling, blisters, and extreme itching. What are the causes? This condition is caused by a chemical (urushiol) found in the sap of the poison ivy plant. This chemical is sticky and can be easily spread to people, animals, and objects. You can get poison ivy dermatitis by:  Having direct contact with a poison ivy plant.  Touching animals, other people, or objects that have come in contact with poison ivy and have the chemical on them. What increases the risk? This condition is more likely to develop in people who:  Are outdoors often in wooded or [place] areas.  Go outdoors without wearing protective clothing, such as closed shoes, long pants, and a long-sleeved shirt. What are the signs or symptoms? Symptoms of this condition include:  Redness of the skin.  Extreme itching.  A rash that often includes bumps and blisters. The rash usually appears 48 hours after exposure, if you have been exposed before. If this is the first time you have been exposed, the rash may not appear until a week after exposure.  Swelling. This may occur if the reaction is more severe. Symptoms usually last for 1-2 weeks. However, the  first time you develop this condition, symptoms may last 3-4 weeks. How is this diagnosed? This condition may be diagnosed based on your symptoms and a physical exam. Your health care provider may also ask you about any recent outdoor activity. How is this treated? Treatment for this condition will vary depending on how severe it is. Treatment may include:  Hydrocortisone cream or calamine lotion to relieve itching.  Oatmeal baths to soothe the skin.  Medicines, such as over-the-counter antihistamine tablets.  Oral steroid medicine, for more severe reactions. Follow these instructions at home: Medicines  Take or apply over-the-counter and prescription medicines only as told by your health care provider.  Use hydrocortisone cream or calamine lotion as needed to soothe the skin and relieve itching. General instructions  Do not scratch or rub your skin.  Apply a cold, wet cloth (cold compress) to the affected areas or take baths in cool water. This will help with itching. Avoid hot baths and showers.  Take oatmeal baths as needed. Use colloidal oatmeal. You can get this at your local pharmacy or grocery store. Follow the instructions on the packaging.  While you have the rash, wash clothes right after you wear them.  Keep all follow-up visits as told by your health care provider. This is important. How is this prevented?   Learn to identify the poison ivy plant and avoid contact with the plant. This plant can be recognized by the number of leaves. Generally, poison ivy has three leaves with flowering branches on a single  stem. The leaves are typically glossy, and they have jagged edges that come to a point at the front.  If you have been exposed to poison ivy, thoroughly wash with soap and water right away. You have about 30 minutes to remove the plant resin before it will cause the rash. Be sure to wash under your fingernails, because any plant resin there will continue to spread the  rash.  When hiking or camping, wear clothes that will help you to avoid exposure on the skin. This includes long pants, a long-sleeved shirt, tall socks, and hiking boots. You can also apply preventive lotion to your skin to help limit exposure.  If you suspect that your clothes or outdoor gear came in contact with poison ivy, rinse them off outside with a garden hose before you bring them inside your house.  When doing yard work or gardening, wear gloves, long sleeves, long pants, and boots. Wash your garden tools and gloves if they come in contact with poison ivy.  If you suspect that your pet has come into contact with poison ivy, wash him or her with pet shampoo and water. Make sure to wear gloves while washing your pet. Contact a health care provider if you have:  Open sores in the rash area.  More redness, swelling, or pain in the affected area.  Redness that spreads beyond the rash area.  Fluid, blood, or pus coming from the affected area.  A fever.  A rash over a large area of your body.  A rash on your eyes, mouth, or genitals.  A rash that does not improve after a few weeks. Get help right away if:  Your face swells or your eyes swell shut.  You have trouble breathing.  You have trouble swallowing. These symptoms may represent a serious problem that is an emergency. Do not wait to see if the symptoms will go away. Get medical help right away. Call your local emergency services (911 in the [place] Do not drive yourself to the hospital. Summary  Poison ivy dermatitis is inflammation of the skin that is caused by chemicals in the leaves of the poison ivy plant.  Symptoms of this condition include redness, itching, a rash, and swelling.  Do not scratch or rub your skin.  Take or apply over-the-counter and prescription medicines only as told by your health care provider. This information is not intended to replace advice given to you by your health care provider. Make  sure you discuss any questions you have with your health care provider. Document Released: 03/05/2000 Document Revised: 06/30/2018 Document Reviewed: 03/03/2018 Elsevier Patient Education  2020 [employer].

## 2018-11-20 ENCOUNTER — Other Ambulatory Visit: Payer: Self-pay

## 2018-11-20 DIAGNOSIS — I83899 Varicose veins of unspecified lower extremities with other complications: Secondary | ICD-10-CM

## 2018-11-22 ENCOUNTER — Other Ambulatory Visit: Payer: Self-pay | Admitting: Family Medicine

## 2018-11-22 ENCOUNTER — Other Ambulatory Visit: Payer: Self-pay | Admitting: Medical

## 2018-11-23 ENCOUNTER — Other Ambulatory Visit: Payer: Self-pay | Admitting: Medical

## 2018-11-24 ENCOUNTER — Encounter: Payer: Self-pay | Admitting: Vascular Surgery

## 2018-11-24 ENCOUNTER — Other Ambulatory Visit: Payer: Self-pay

## 2018-11-24 ENCOUNTER — Ambulatory Visit (HOSPITAL_COMMUNITY)
Admission: RE | Admit: 2018-11-24 | Discharge: 2018-11-24 | Disposition: A | Discharge status: Home / Self Care | Payer: Medicare Other | Source: Ambulatory Visit | Attending: Family | Admitting: Family

## 2018-11-24 ENCOUNTER — Ambulatory Visit (INDEPENDENT_AMBULATORY_CARE_PROVIDER_SITE_OTHER): Payer: Medicare Other | Admitting: Vascular Surgery

## 2018-11-24 VITALS — BP 130/73 | HR 59 | Resp 20 | Ht 67.0 in | Wt 239.0 lb

## 2018-11-24 DIAGNOSIS — I872 Venous insufficiency (chronic) (peripheral): Secondary | ICD-10-CM | POA: Diagnosis not present

## 2018-11-24 DIAGNOSIS — I83899 Varicose veins of unspecified lower extremities with other complications: Secondary | ICD-10-CM | POA: Diagnosis present

## 2018-11-24 NOTE — Progress Notes (Signed)
Patient ID: [name], female   DOB: April 25, 1955, 63 y.o.   MRN: [id number]  Reason for Consult: New Patient (Initial Visit)   Referred by Carlena Hurl, PA-C  Subjective:     HPI:  Emma [name] is a 63 y.o. female history of previous spider vein treatment.  Does not have lower extremity vascular disease.  States that she has swelling of her bilateral lower extremities particular when she is on her feet.  Also has skin discoloration in her ankles.  She has multiple varicose veins and spider veins.  Mostly complains of pain along the medial aspect of her thigh.  Has never had vein of the mention other than spider veins.  Does not have any ulcerations never had ulceration of her lower extremities.  Past Medical History:  Diagnosis Date  . Chronic constipation   . Cluster headache syndrome 04/27/13  . Colon polyp 06/11/2011   Most recent 04/23/08, no polyp--mild diverticulosis.  Repeat 10 yrs.  . Depression with anxiety 02/22/2008   Qualifier: Diagnosis of  By: Nolon Rod CMA (AAMA), Robin    . Diabetes mellitus without complication ([place])   . Diverticulosis   . GERD (gastroesophageal reflux disease) 06/11/2011  . Hypertension    Recently diagnosed  . Migraine syndrome   . Osteopenia 11/2016   bone density. plan repeat in 11/2018  . Overweight(278.02) 06/11/2011  . PUD (peptic ulcer disease) 04/23/08   Multiple antrum ulcers; h pylori neg  . Rheumatoid arthritis(714.0)    Family History  Problem Relation Age of Onset  . Diabetes Mother   . Stroke Mother   . Arthritis Father   . Hypertension Father   . Atrial fibrillation Father   . Alcohol abuse Sister   . Cancer Sister        mesothelioma  . Heart disease Sister        cardiomegaly  . Hypertension Sister   . Pulmonary embolism Sister   . Hypertension Brother   . Polycystic ovary syndrome Daughter   . Heart disease Maternal Grandmother   . Heart disease Maternal Grandfather   . Colon cancer Paternal Uncle   .  Esophageal cancer Neg Hx   . Stomach cancer Neg Hx    Past Surgical History:  Procedure Laterality Date  . ABDOMINAL HYSTERECTOMY    . APPENDECTOMY    . BACK SURGERY     3 times  . CARDIOVASCULAR STRESS TEST  06/22/11   Normal; EF 65%  . CHOLECYSTECTOMY    . COLONOSCOPY  04/23/2008   mild diverticulosis, internal hemorrhoids; Dr. Erskine Emery  . LIVER BIOPSY  03/2007   Chronic active hepatitis w/fibrosis--related to hx of methotrexate treatment.  Marland Kitchen SKIN BIOPSY  08/16/2017   superficial basal cell carcinoma left inferior medial anterior tibia     Short Social History:  Social History   Tobacco Use  . Smoking status: Never Smoker  . Smokeless tobacco: Never Used  Substance Use Topics  . Alcohol use: Yes    Comment: Occasional    Allergies  Allergen Reactions  . Bee Venom Anaphylaxis    Current Outpatient Medications  Medication Sig Dispense Refill  . acetaminophen (TYLENOL) 500 MG tablet Take 1,500 mg by mouth once as needed for mild pain or moderate pain.    Marland Kitchen ALPRAZolam (XANAX) 0.5 MG tablet TAKE 1 TABLET BY MOUTH EVERYDAY AT BEDTIME 60 tablet 0  . AMBULATORY NON FORMULARY MEDICATION Medication Name: *Nitroglycerin 0.125% gel. Apply a pea size amount to your  rectum three times daily x 6-8 weeks (Patient taking differently: Place 1 application rectally See admin instructions. Medication Name: *Nitroglycerin 0.125% gel. Apply a pea size amount to your rectum three times daily x 6-8 weeks) 30 g 0  . BIOTIN PO Take 1 capsule by mouth daily.    . busPIRone (BUSPAR) 7.5 MG tablet TAKE 1 TABLET (7.5 MG TOTAL) BY MOUTH 2 (TWO) TIMES DAILY. 180 tablet 1  . calcium carbonate ([facility] CALCIUM) 1500 (600 Ca) MG TABS tablet TAKE 1 TABLET (600 MG TOTAL) BY MOUTH 2 (TWO) TIMES DAILY WITH A MEAL. 150 tablet 1  . carisoprodol (SOMA) 350 MG tablet TAKE 1 TABLET BY MOUTH TWICE A DAY AND AT BEDTIME AS NEEDED FOR MUSCLE SPASMS  0  . Cholecalciferol (VITAMIN D3) 250 MCG (10000 UT) TABS Take 1 tablet  by mouth daily. 90 tablet 3  . dicyclomine (BENTYL) 10 MG capsule TAKE 1-2 CAPSULES (10-20 MG TOTAL) BY MOUTH EVERY 8 (EIGHT) HOURS AS NEEDED FOR SPASMS. 60 capsule 2  . diphenhydrAMINE (BENADRYL) 25 mg capsule Take 50 mg by mouth once as needed for itching or allergies.    Marland Kitchen EPINEPHrine 0.3 mg/0.3 mL IJ SOAJ injection Inject 0.3 mg into the muscle as needed.    Eduard Roux (AIMOVIG) 70 MG/ML SOAJ Inject 70 mg into the skin every 30 (thirty) days. 1 pen 11  . etodolac (LODINE) 400 MG tablet TAKE 1 TABLET BY MOUTH TWICE A DAY WITH FOOD AS NEEDED FOR PAIN  1  . gabapentin (NEURONTIN) 300 MG capsule TAKE 1 CAPSULE (300 MG TOTAL) BY MOUTH 3 (THREE) TIMES DAILY AS NEEDED. 270 capsule 2  . Galcanezumab-gnlm (EMGALITY) 120 MG/ML SOSY Inject 120 mg into the skin every 30 (thirty) days. 1 Syringe 8  . HYDROcodone-acetaminophen (NORCO) 7.5-325 MG per tablet Take 1 tablet by mouth daily as needed for moderate pain. Reported on 09/05/2015    . hydroxychloroquine (PLAQUENIL) 200 MG tablet Take 400 mg by mouth daily.  2  . hydrOXYzine (ATARAX/VISTARIL) 25 MG tablet Take 1 tablet (25 mg total) by mouth every 8 (eight) hours as needed for itching. 30 tablet 0  . Insulin Pen Needle (B-D ULTRAFINE III SHORT PEN) 31G X 8 MM MISC USE AS DIRECTED 100 each 11  . lisinopril (ZESTRIL) 20 MG tablet TAKE 1 TABLET BY MOUTH EVERY DAY 90 tablet 0  . lubiprostone (AMITIZA) 24 MCG capsule Take 1 capsule (24 mcg total) by mouth 2 (two) times daily with a meal. 60 capsule 3  . metFORMIN (GLUCOPHAGE) 500 MG tablet Take 1 tablet (500 mg total) by mouth daily with breakfast. 90 tablet 3  . Multiple Vitamins-Iron (MULTIVITAMINS WITH IRON) TABS tablet Take 1 tablet by mouth daily. 90 tablet 0  . omeprazole (PRILOSEC) 40 MG capsule TAKE 1 CAPSULE BY MOUTH EVERY DAY 90 capsule 0  . ondansetron (ZOFRAN ODT) 4 MG disintegrating tablet Take 1 tablet (4 mg total) by mouth every 8 (eight) hours as needed. 20 tablet 6  . Plecanatide  (TRULANCE) 3 MG TABS Take 3 mg by mouth daily. 90 tablet 3  . predniSONE (STERAPRED UNI-PAK 21 TAB) 10 MG (21) TBPK tablet Take by mouth daily. 21 tablet 0  . rosuvastatin (CRESTOR) 20 MG tablet TAKE 1 TABLET BY MOUTH EVERYDAY AT BEDTIME 90 tablet 3  . Semaglutide, 1 MG/DOSE, (OZEMPIC, 1 MG/DOSE,) 2 MG/1.5ML SOPN Inject 1 mg into the skin once a week. 1.5 mL 5  . SUMAtriptan (IMITREX) 100 MG tablet Take one tablet onset of  migraine. May repeat in 2 hours if headache persists or recurs (max of 2 in 24hours) 12 tablet 11  . tiZANidine (ZANAFLEX) 4 MG tablet Take 1 tablet (4 mg total) by mouth every 6 (six) hours as needed for muscle spasms. 30 tablet 6  . triamcinolone cream (KENALOG) 0.5 % Apply 1 application topically 2 (two) times daily. 30 g 0  . venlafaxine (EFFEXOR) 37.5 MG tablet Take 1 tablet (37.5 mg total) by mouth daily. 90 tablet 4  . verapamil (CALAN-SR) 240 MG CR tablet Take 1 tablet (240 mg total) by mouth at bedtime. 90 tablet 4   No current facility-administered medications for this visit.     Review of Systems  Constitutional:  Constitutional negative. HENT: HENT negative.  Eyes: Eyes negative.  Respiratory: Respiratory negative.  Cardiovascular: Positive for leg swelling.  GI: Gastrointestinal negative.  Musculoskeletal: Musculoskeletal negative.  Skin: Positive for rash.  Neurological: Neurological negative. Hematologic: Hematologic/lymphatic negative.  Psychiatric: Psychiatric negative.        Objective:  Objective   Vitals:   11/24/18 1335  BP: 130/73  Pulse: (!) 59  Resp: 20  SpO2: 96%  Weight: 239 lb (108.4 kg)  Height: 5\' 7"  (1.702 m)   Body mass index is 37.43 kg/m.  Physical Exam HENT:     Head: Normocephalic.     Mouth/Throat:     Mouth: Mucous membranes are moist.  Eyes:     Pupils: Pupils are equal, round, and reactive to light.  Neck:     Musculoskeletal: Neck supple.  Cardiovascular:     Rate and Rhythm: Regular rhythm.     Pulses:           Popliteal pulses are 2+ on the right side and 2+ on the left side.       Posterior tibial pulses are 2+ on the right side and 2+ on the left side.  Pulmonary:     Effort: Pulmonary effort is normal.  Abdominal:     General: Abdomen is flat.     Palpations: Abdomen is soft.  Musculoskeletal:     Right lower leg: Edema present.     Left lower leg: Edema present.  Skin:    General: Skin is warm and dry.     Capillary Refill: Capillary refill takes less than 2 seconds.     Comments: Multiple areas of spider telangiectasias below the knees.  Large varicosity left medial thigh  Neurological:     General: No focal deficit present.     Mental Status: She is alert.  Psychiatric:        Mood and Affect: Mood normal.        Behavior: Behavior normal.        Thought Content: Thought content normal.        Judgment: Judgment normal.     Data: I have independently interpreted her extremity venous reflux study.  This demonstrates reflux on the right up to 4085 ms at the saphenofemoral junction.  Right side measures up to 0.7 cm at the junction.  Left side reflux up to 2780 in the mid thigh.  Size up to 0.95 cm of the junction.     Assessment/Plan:     63 year old female with C3 venous disease and significant reflux bilateral lower extremities with large saphenous veins bilaterally.  We will get her fitted for compression stockings thigh-high 20 to 30 mmHg today.  She will follow-up in 3 months for evaluation of treatment.  We discussed elevating her  legs when recumbent walking is much as tolerated and wearing compression when she is ambulatory.     Waynetta Sandy MD [facility]

## 2018-11-28 ENCOUNTER — Telehealth: Payer: Self-pay | Admitting: Gastroenterology

## 2018-11-28 ENCOUNTER — Telehealth: Payer: Self-pay

## 2018-11-28 ENCOUNTER — Ambulatory Visit: Payer: Medicare HMO | Admitting: Physician Assistant

## 2018-11-28 ENCOUNTER — Other Ambulatory Visit: Payer: Self-pay | Admitting: Gastroenterology

## 2018-11-28 MED ORDER — MOTEGRITY 2 MG PO TABS: 2.0000 mg | ORAL_TABLET | Freq: Every day | ORAL | 6 refills | Status: DC

## 2018-11-28 NOTE — Telephone Encounter (Signed)
Pt has an appt with Amy Esterwood today.  Will address refill request at today's appt

## 2018-11-28 NOTE — Telephone Encounter (Signed)
Pt just cancelled appt with Dr. Enis Gash for today, she states that one of her water pipes broke yesterday so she won't be able to come for her appt. She is requesting prescription for [facility] to  [facility] [place].

## 2018-11-28 NOTE — Telephone Encounter (Signed)
-----   Message from Alfredia Ferguson, PA-C sent at 11/28/2018 12:26 PM EDT ----- Regarding: med refill PT had to cancel appt today  with me - ok to refill Motegrity 2 mg po daily # 30 /6 I saw you had been sent a phone note earlier , so sending to you - thanks

## 2018-11-28 NOTE — Telephone Encounter (Signed)
Script for [employer] 2mg  sent to pharmacy

## 2018-12-06 ENCOUNTER — Encounter: Payer: Self-pay | Admitting: Vascular Surgery

## 2018-12-26 ENCOUNTER — Other Ambulatory Visit: Payer: Self-pay | Admitting: Gastroenterology

## 2019-01-13 IMAGING — US US PELVIS COMPLETE TRANSABD/TRANSVAG
1 series · 14 of 25 positions shown · non-contrast
Comparison: None available.

CLINICAL DATA: Initial evaluation for pelvic pain with bleeding.
History of prior hysterectomy.

EXAM:
TRANSABDOMINAL AND TRANSVAGINAL ULTRASOUND OF PELVIS
TECHNIQUE: Both transabdominal and transvaginal ultrasound examinations of the
pelvis were performed. Transabdominal technique was performed for
global imaging of the pelvis including uterus, ovaries, adnexal
regions, and pelvic cul-de-sac. It was necessary to proceed with
endovaginal exam following the transabdominal exam to visualize the
pelvic structures.

[Series 1: us pelvis complete transabd/transvag · 0.25mm/px · 14 of 33 slices shown]
[im 1/33]
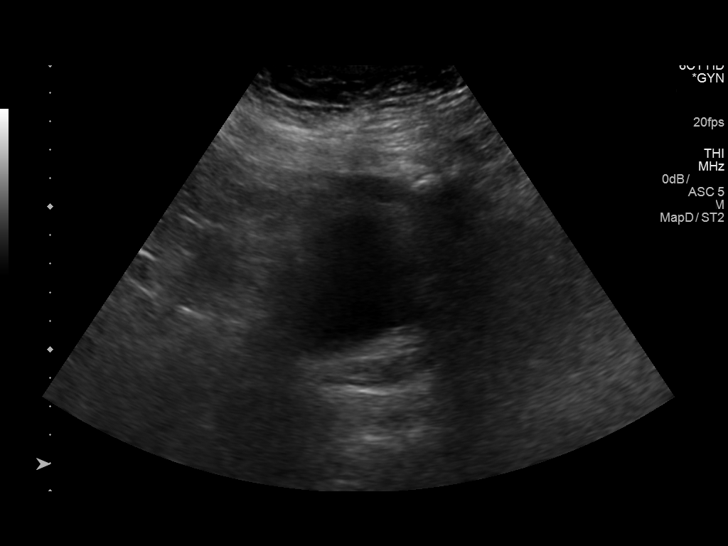
[im 3/33]
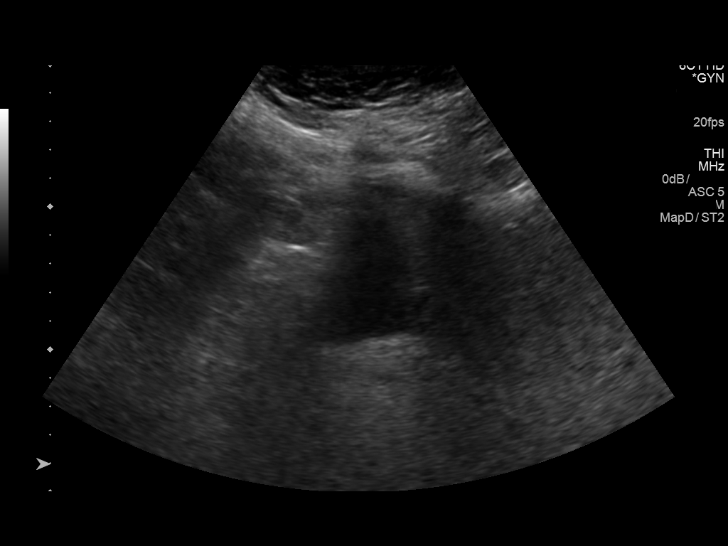
[im 6/33]
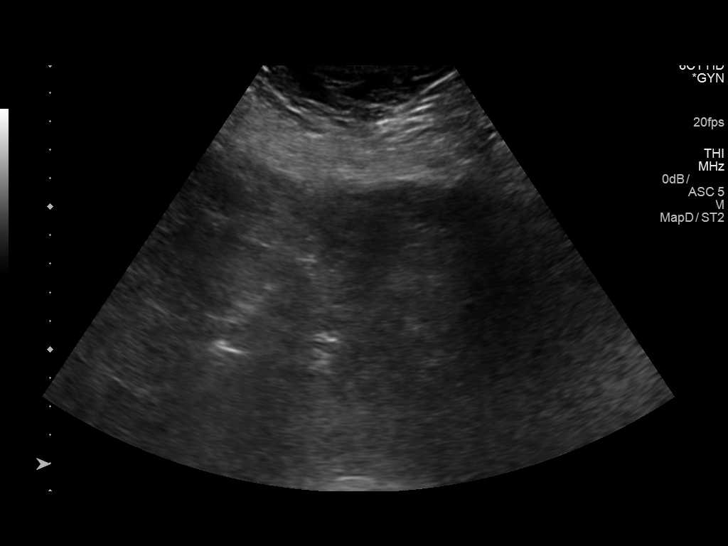
[im 9/33]
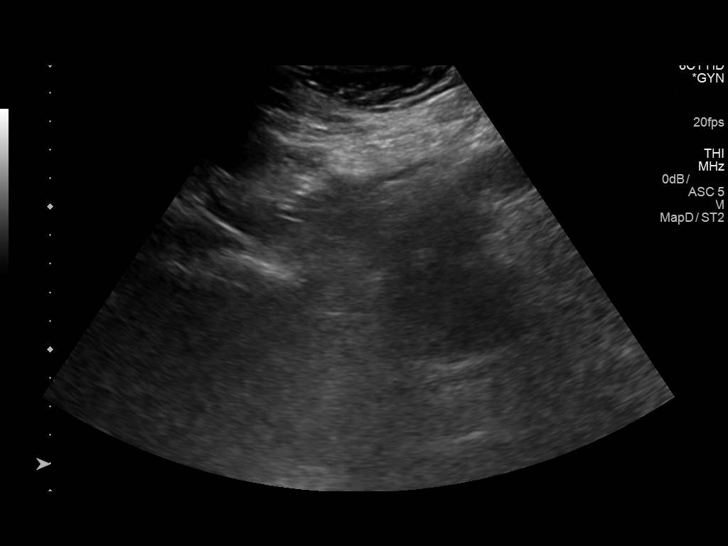
[im 11/33]
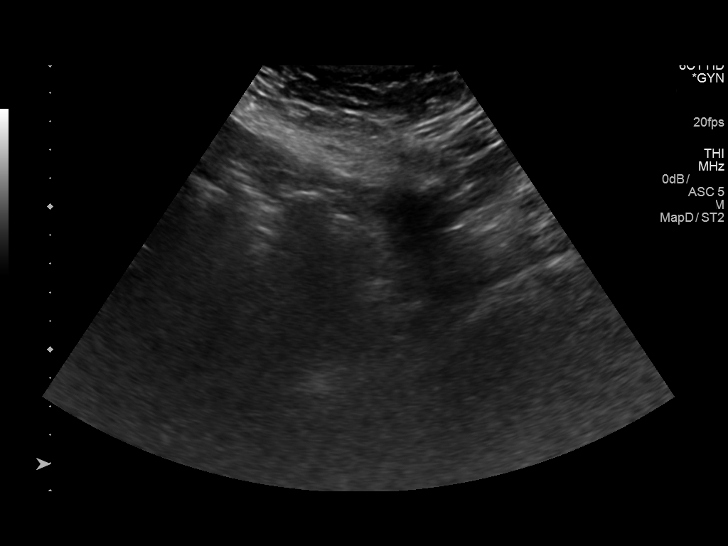
[im 13/33]
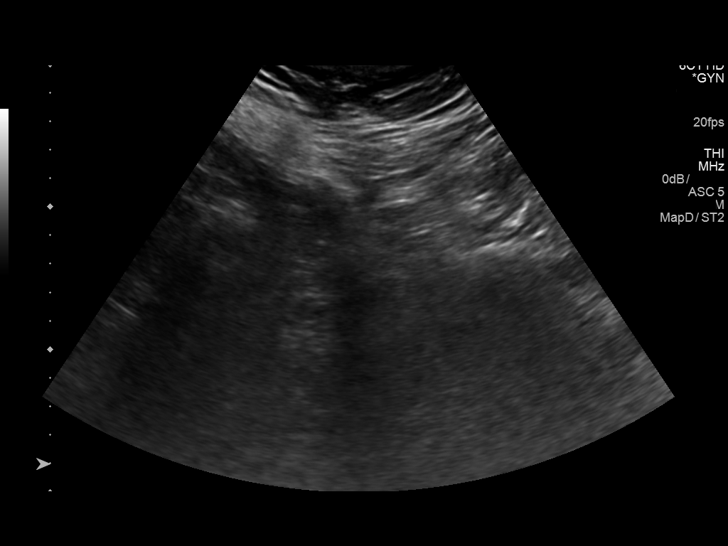
[im 15/33]
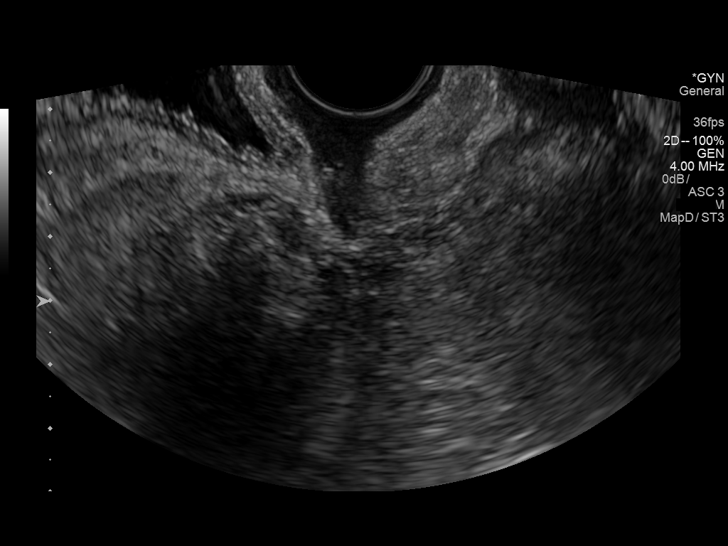
[im 18/33]
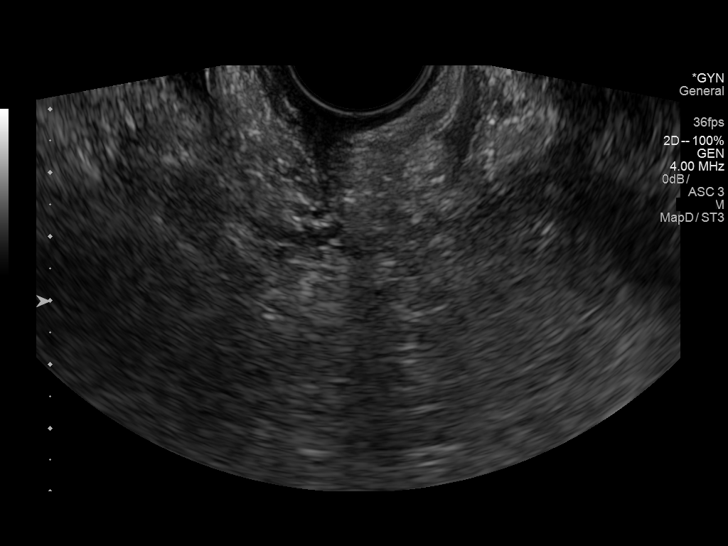
[im 21/33]
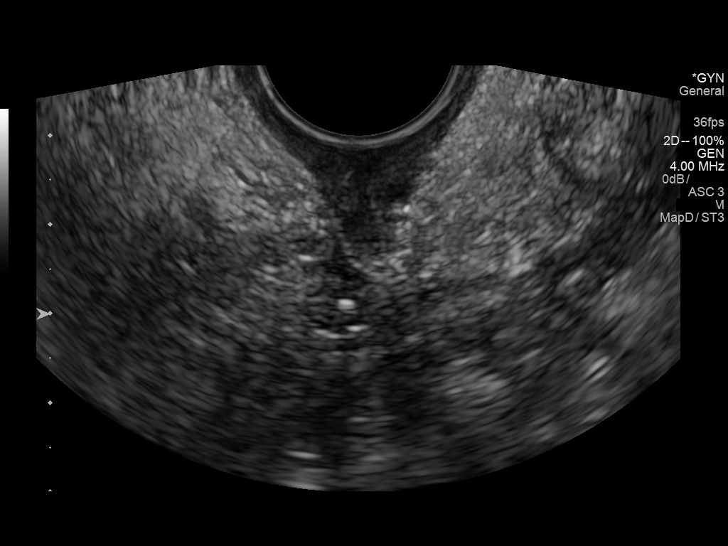
[im 22/33]
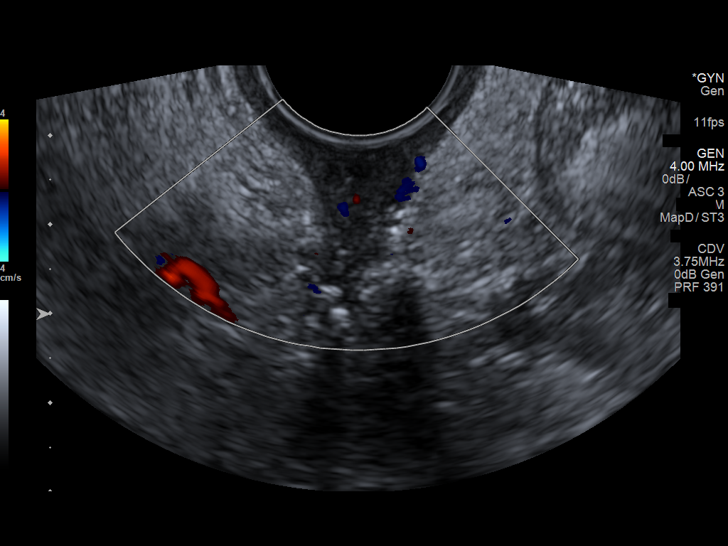
[im 25/33]
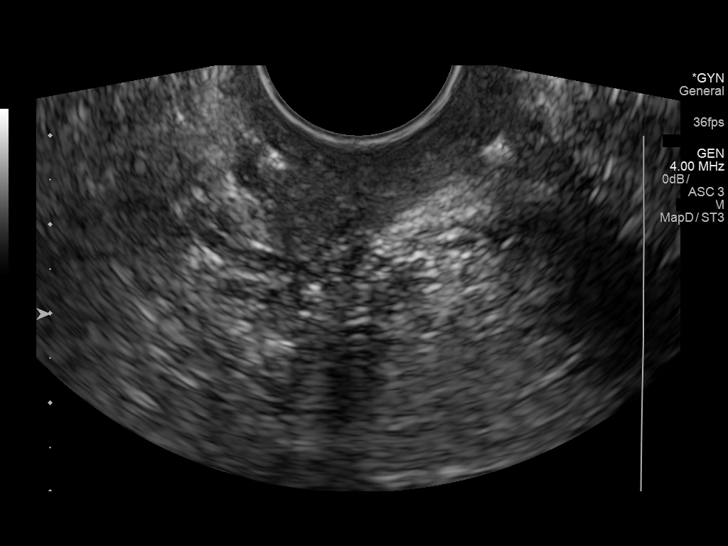
[im 27/33]
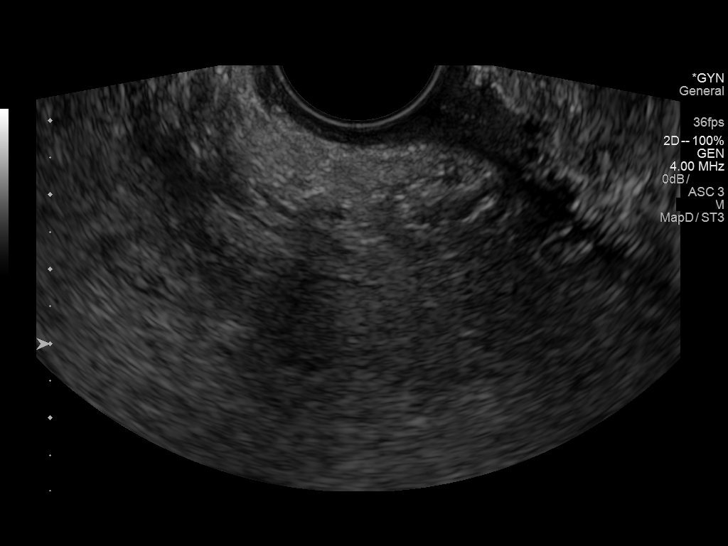
[im 30/33]
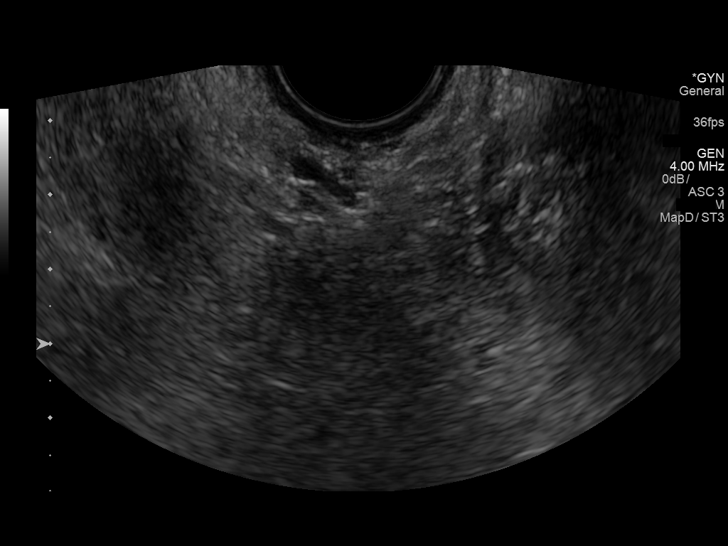
[im 33/33]
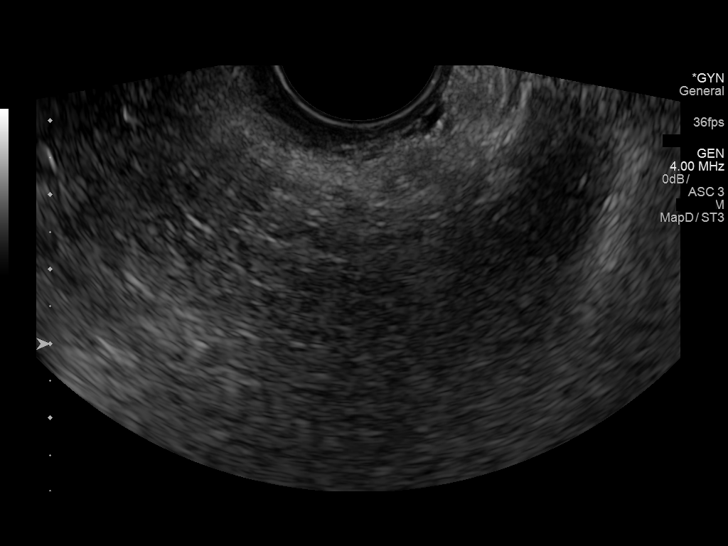

[14 of 25 positions shown; findings below may reference images not displayed]

FINDINGS: Uterus

Surgically absent.  No abnormality about the vaginal cuff.

Endometrium

Surgically absent.

Right ovary

Not visualized.  No adnexal mass.

Left ovary

Not visualized.  No adnexal mass.

Other findings

No abnormal free fluid.
IMPRESSION: 1. Sequelae of prior hysterectomy with nonvisualization of the
uterus and ovaries. No pelvic or adnexal mass. No free fluid.
2. No other acute abnormality identified.

## 2019-01-22 ENCOUNTER — Other Ambulatory Visit: Payer: Self-pay | Admitting: Gastroenterology

## 2019-01-25 ENCOUNTER — Ambulatory Visit: Payer: Medicare Other | Admitting: Gastroenterology

## 2019-01-25 ENCOUNTER — Encounter: Payer: Self-pay | Admitting: General Surgery

## 2019-01-30 ENCOUNTER — Other Ambulatory Visit: Payer: Self-pay

## 2019-01-30 MED ORDER — DICYCLOMINE HCL 10 MG PO CAPS: 10.0000 mg | ORAL_CAPSULE | Freq: Three times a day (TID) | ORAL | 1 refills | Status: DC | PRN

## 2019-01-30 NOTE — Progress Notes (Signed)
rec'd refill request via fax from [place]

## 2019-02-02 ENCOUNTER — Other Ambulatory Visit: Payer: Self-pay | Admitting: Medical

## 2019-02-02 LAB — HM MAMMOGRAPHY

## 2019-02-02 NOTE — Telephone Encounter (Signed)
Is this okay to refill? 

## 2019-02-05 ENCOUNTER — Telehealth: Payer: Self-pay | Admitting: Medical

## 2019-02-05 NOTE — Telephone Encounter (Signed)
I am happy to report that her mammogram was normal, no worrisome findings.

## 2019-02-06 NOTE — Telephone Encounter (Signed)
lmom of normal result.

## 2019-02-09 ENCOUNTER — Encounter: Payer: Self-pay | Admitting: Medical

## 2019-02-09 NOTE — Progress Notes (Signed)
error 

## 2019-02-14 ENCOUNTER — Other Ambulatory Visit: Payer: Self-pay | Admitting: Medical

## 2019-02-16 ENCOUNTER — Other Ambulatory Visit: Payer: Self-pay | Admitting: Medical

## 2019-02-19 NOTE — Telephone Encounter (Signed)
Ok to refill gabapentin 

## 2019-02-28 ENCOUNTER — Other Ambulatory Visit: Payer: Self-pay | Admitting: Medical

## 2019-02-28 ENCOUNTER — Ambulatory Visit (INDEPENDENT_AMBULATORY_CARE_PROVIDER_SITE_OTHER): Payer: Medicare Other | Admitting: Gastroenterology

## 2019-02-28 ENCOUNTER — Other Ambulatory Visit (INDEPENDENT_AMBULATORY_CARE_PROVIDER_SITE_OTHER): Payer: Medicare Other

## 2019-02-28 ENCOUNTER — Encounter: Payer: Self-pay | Admitting: Gastroenterology

## 2019-02-28 VITALS — BP 130/80 | HR 92 | Temp 97.9°F | Ht 67.0 in | Wt 242.5 lb

## 2019-02-28 DIAGNOSIS — R1010 Upper abdominal pain, unspecified: Secondary | ICD-10-CM

## 2019-02-28 DIAGNOSIS — R6881 Early satiety: Secondary | ICD-10-CM

## 2019-02-28 DIAGNOSIS — K5909 Other constipation: Secondary | ICD-10-CM

## 2019-02-28 LAB — COMPREHENSIVE METABOLIC PANEL
ALT: 13 U/L (ref 0–35)
AST: 13 U/L (ref 0–37)
Albumin: 4 g/dL (ref 3.5–5.2)
Alkaline Phosphatase: 49 U/L (ref 39–117)
BUN: 17 mg/dL (ref 6–23)
CO2: 25 mEq/L (ref 19–32)
Calcium: 8.9 mg/dL (ref 8.4–10.5)
Chloride: 106 mEq/L (ref 96–112)
Creatinine, Ser: 1.28 mg/dL — ABNORMAL HIGH (ref 0.40–1.20)
GFR: 41.81 mL/min — ABNORMAL LOW (ref >60.00)
Glucose, Bld: 130 mg/dL — ABNORMAL HIGH (ref 70–99)
Potassium: 4.4 mEq/L (ref 3.5–5.1)
Sodium: 139 mEq/L (ref 135–145)
Total Bilirubin: 0.4 mg/dL (ref 0.2–1.2)
Total Protein: 6.8 g/dL (ref 6.0–8.3)

## 2019-02-28 LAB — CBC WITH DIFFERENTIAL/PLATELET
Basophils Absolute: 0.1 10*3/uL (ref 0.0–0.1)
Basophils Relative: 1.3 % (ref 0.0–3.0)
Eosinophils Absolute: 0.2 10*3/uL (ref 0.0–0.7)
Eosinophils Relative: 3.7 % (ref 0.0–5.0)
HCT: 34.4 % — ABNORMAL LOW (ref 36.0–46.0)
Hemoglobin: 11.1 g/dL — ABNORMAL LOW (ref 12.0–15.0)
Lymphocytes Relative: 33.3 % (ref 12.0–46.0)
Lymphs Abs: 2.1 10*3/uL (ref 0.7–4.0)
MCHC: 32.3 g/dL (ref 30.0–36.0)
MCV: 89.5 fl (ref 78.0–100.0)
Monocytes Absolute: 0.6 10*3/uL (ref 0.1–1.0)
Monocytes Relative: 8.8 % (ref 3.0–12.0)
Neutro Abs: 3.4 10*3/uL (ref 1.4–7.7)
Neutrophils Relative %: 52.9 % (ref 43.0–77.0)
Platelets: 242 10*3/uL (ref 150.0–400.0)
RBC: 3.84 Mil/uL — ABNORMAL LOW (ref 3.87–5.11)
RDW: 15.7 % — ABNORMAL HIGH (ref 11.5–15.5)
WBC: 6.4 10*3/uL (ref 4.0–10.5)

## 2019-02-28 LAB — LIPASE: Lipase: 25 U/L (ref 11.0–59.0)

## 2019-02-28 MED ORDER — SUCRALFATE 1 G PO TABS: 1.0000 g | ORAL_TABLET | Freq: Four times a day (QID) | ORAL | 1 refills | Status: DC | PRN

## 2019-02-28 NOTE — Patient Instructions (Addendum)
If you are age 65 or older, your body mass index should be between 23-30. Your Body mass index is 37.98 kg/m. If this is out of the aforementioned range listed, please consider follow up with your Primary Care Provider.  If you are age 37 or younger, your body mass index should be between 19-25. Your Body mass index is 37.98 kg/m. If this is out of the aformentioned range listed, please consider follow up with your Primary Care Provider.   Please go to the lab in the basement of our building to have lab work done as you leave today. Hit "B" for basement when you get on the elevator.  When the doors open the lab is on your left.  We will call you with the results. Thank you.   You have been scheduled for a CT scan of the abdomen and pelvis at [facility], 1st floor Radiology.  You are scheduled on Wednesday, 03-06-19 at 8:30am. You should arrive 15 minutes prior to your appointment time for registration. Please follow the written instructions below on the day of your exam:  WARNING: IF YOU ARE ALLERGIC TO IODINE/X-RAY DYE, PLEASE NOTIFY RADIOLOGY IMMEDIATELY AT [phone]! YOU WILL BE GIVEN A 13 HOUR PREMEDICATION PREP.  1) Do not eat or drink anything after 4:30am(4 hours prior to your test) 2) You have been given 2 bottles of oral contrast to drink. The solution may taste better if refrigerated, but do NOT add ice or any other liquid to this solution. Shake well before drinking.    Drink 1 bottle of contrast @ 6:30am (2 hours prior to your exam)  Drink 1 bottle of contrast @ 7:30am (1 hour prior to your exam)  You may take any medications as prescribed with a small amount of water, if necessary. If you take any of the following medications: METFORMIN, GLUCOPHAGE, GLUCOVANCE, AVANDAMET, RIOMET, FORTAMET, [place] MET, JANUMET, GLUMETZA or METAGLIP, you MAY be asked to HOLD this medication 48 hours AFTER the exam.  The purpose of you drinking the oral contrast is to aid in the  visualization of your intestinal tract. The contrast solution may cause some diarrhea. Depending on your individual set of symptoms, you may also receive an intravenous injection of x-ray contrast/dye. Plan on being at [facility] for 30 minutes or longer, depending on the type of exam you are having performed.  This test typically takes 30-45 minutes to complete.  If you have any questions regarding your exam or if you need to reschedule, you may call [facility] at [phone] between the hours of 8:00 am and 5:00 pm, Monday-Friday.  __________________________________________________________  We have sent the following medications to your pharmacy for you to pick up at your convenience: Carafate tablets: Take one tablet every 6 hours as needed  Thank you for entrusting me with your care and for choosing [facility], Dr. Montclair Cellar

## 2019-02-28 NOTE — Progress Notes (Signed)
HPI :  63 year old female here for follow-up visit.  I have seen her for chronic constipation and hemorrhoid banding in the past.  I have also treated her for dyspepsia and concrement anxiety with buspirone.  She has been on a variety of regimens for her constipation to include Linzess, Amitiza, MiraLAX, Motegrity, Trulance.  She states Trulance has worked better for her than anything else and she is happy with it.  She has a bowel movement every 3 days or so at baseline and works well.  Hemorrhoids not bothering her much post banding.  Main complaint today is abdominal pain which radiates from her epigastric area to periumbilical area.  This has developed over the past year initially intermittent but has progressively worsened for the past few weeks.  She is very concerned she may have cancer.  The pain is present most of the time, however eating reliably makes this worse, after about 10 to 15 minutes.  She has nausea but no vomiting.  She does have early satiety and has been eating half the amount of her normal meals.  She denies much belching.  No weight loss.  She is on Ozempic for her diabetes which can cause GI upset and some of the symptoms however she states the symptoms started before she is started that.  She has been taking omeprazole 40 mg twice a day for her reflux and is also taking buspirone 7 and half milligrams twice a day for her dyspepsia.  Buspirone previously worked quite well for her prior symptoms of dyspepsia however this pain is different from her baseline prior symptoms we have treated her for.  She had an endoscopy with me in March 2019, the exam was unremarkable, biopsies were negative for H. pylori.  Symptoms at that time had improved with buspirone.  She does have diabetes, last A1c was 6.8.  She has never had a prior gastric emptying study  She has a significant bout of anxiety and stress at home, her husband has dementia and she cares for her father who is also elderly.   EGD 05/23/2017 - normal esophagus, stomach, with brunner's gland hyperplasia of the bulb, bx for HP negative Colonoscopy 05/23/2017 - 2 small ascending colon polyps, anal fissure, internal hemorrhoids - repeat 5 years        Past Medical History:  Diagnosis Date  . Chronic constipation   . Cluster headache syndrome 04/27/13  . Colon polyp 06/11/2011   Most recent 04/23/08, no polyp--mild diverticulosis.  Repeat 10 yrs.  . Depression with anxiety 02/22/2008   Qualifier: Diagnosis of  By: Nolon Rod CMA (AAMA), Robin    . Diabetes mellitus without complication ([place])   . Diverticulosis   . GERD (gastroesophageal reflux disease) 06/11/2011  . Hypertension    Recently diagnosed  . Migraine syndrome   . Osteopenia 11/2016   bone density. plan repeat in 11/2018  . Overweight(278.02) 06/11/2011  . PUD (peptic ulcer disease) 04/23/08   Multiple antrum ulcers; h pylori neg  . Rheumatoid arthritis(714.0)      Past Surgical History:  Procedure Laterality Date  . ABDOMINAL HYSTERECTOMY    . APPENDECTOMY    . BACK SURGERY     3 times  . CARDIOVASCULAR STRESS TEST  06/22/11   Normal; EF 65%  . CHOLECYSTECTOMY    . COLONOSCOPY  04/23/2008   mild diverticulosis, internal hemorrhoids; Dr. Erskine Emery  . LIVER BIOPSY  03/2007   Chronic active hepatitis w/fibrosis--related to hx of methotrexate treatment.  Marland Kitchen  SKIN BIOPSY  08/16/2017   superficial basal cell carcinoma left inferior medial anterior tibia    Family History  Problem Relation Age of Onset  . Diabetes Mother   . Stroke Mother   . Arthritis Father   . Hypertension Father   . Atrial fibrillation Father   . Alcohol abuse Sister   . Cancer Sister        mesothelioma  . Heart disease Sister        cardiomegaly  . Hypertension Sister   . Pulmonary embolism Sister   . Hypertension Brother   . Polycystic ovary syndrome Daughter   . Heart disease Maternal Grandmother   . Heart disease Maternal Grandfather   . Colon cancer Paternal  Uncle   . Esophageal cancer Neg Hx   . Stomach cancer Neg Hx    Social History   Tobacco Use  . Smoking status: Never Smoker  . Smokeless tobacco: Never Used  Substance Use Topics  . Alcohol use: Not Currently  . Drug use: No   Current Outpatient Medications  Medication Sig Dispense Refill  . acetaminophen (TYLENOL) 500 MG tablet Take 1,500 mg by mouth once as needed for mild pain or moderate pain.    Marland Kitchen ALPRAZolam (XANAX) 0.5 MG tablet TAKE 1 TABLET BY MOUTH EVERYDAY AT BEDTIME 30 tablet 1  . AMBULATORY NON FORMULARY MEDICATION Medication Name: *Nitroglycerin 0.125% gel. Apply a pea size amount to your rectum three times daily x 6-8 weeks (Patient taking differently: Place 1 application rectally See admin instructions. Medication Name: *Nitroglycerin 0.125% gel. Apply a pea size amount to your rectum three times daily x 6-8 weeks) 30 g 0  . BIOTIN PO Take 1 capsule by mouth daily.    . busPIRone (BUSPAR) 7.5 MG tablet TAKE 1 TABLET (7.5 MG TOTAL) BY MOUTH 2 (TWO) TIMES DAILY. 180 tablet 1  . calcium carbonate ([employer] CALCIUM) 1500 (600 Ca) MG TABS tablet TAKE 1 TABLET (600 MG TOTAL) BY MOUTH 2 (TWO) TIMES DAILY WITH A MEAL. 150 tablet 1  . carisoprodol (SOMA) 350 MG tablet TAKE 1 TABLET BY MOUTH TWICE A DAY AND AT BEDTIME AS NEEDED FOR MUSCLE SPASMS  0  . Cholecalciferol (VITAMIN D3) 250 MCG (10000 UT) TABS Take 1 tablet by mouth daily. 90 tablet 3  . dicyclomine (BENTYL) 10 MG capsule Take 1-2 capsules (10-20 mg total) by mouth every 8 (eight) hours as needed for spasms. 120 capsule 1  . diphenhydrAMINE (BENADRYL) 25 mg capsule Take 50 mg by mouth once as needed for itching or allergies.    Marland Kitchen EPINEPHrine 0.3 mg/0.3 mL IJ SOAJ injection Inject 0.3 mg into the muscle as needed.    Eduard Roux (AIMOVIG) 70 MG/ML SOAJ Inject 70 mg into the skin every 30 (thirty) days. 1 pen 11  . etodolac (LODINE) 400 MG tablet TAKE 1 TABLET BY MOUTH TWICE A DAY WITH FOOD AS NEEDED FOR PAIN  1  . gabapentin  (NEURONTIN) 300 MG capsule TAKE 1 CAPSULE (300 MG TOTAL) BY MOUTH 3 (THREE) TIMES DAILY AS NEEDED. 270 capsule 0  . Galcanezumab-gnlm (EMGALITY) 120 MG/ML SOSY Inject 120 mg into the skin every 30 (thirty) days. 1 Syringe 8  . HYDROcodone-acetaminophen (NORCO) 7.5-325 MG per tablet Take 1 tablet by mouth daily as needed for moderate pain. Reported on 09/05/2015    . hydroxychloroquine (PLAQUENIL) 200 MG tablet Take 400 mg by mouth daily.  2  . hydrOXYzine (ATARAX/VISTARIL) 25 MG tablet Take 1 tablet (25 mg total) by  mouth every 8 (eight) hours as needed for itching. 30 tablet 0  . Insulin Pen Needle (B-D ULTRAFINE III SHORT PEN) 31G X 8 MM MISC USE AS DIRECTED 100 each 11  . lisinopril (ZESTRIL) 20 MG tablet TAKE 1 TABLET BY MOUTH EVERY DAY 90 tablet 0  . lubiprostone (AMITIZA) 24 MCG capsule Take 1 capsule (24 mcg total) by mouth 2 (two) times daily with a meal. 60 capsule 3  . metFORMIN (GLUCOPHAGE) 500 MG tablet TAKE 1 TABLET BY MOUTH EVERY DAY WITH BREAKFAST 90 tablet 0  . Multiple Vitamins-Iron (MULTIVITAMINS WITH IRON) TABS tablet Take 1 tablet by mouth daily. 90 tablet 0  . omeprazole (PRILOSEC) 40 MG capsule TAKE 1 CAPSULE BY MOUTH EVERY DAY 90 capsule 0  . ondansetron (ZOFRAN ODT) 4 MG disintegrating tablet Take 1 tablet (4 mg total) by mouth every 8 (eight) hours as needed. 20 tablet 6  . Plecanatide (TRULANCE) 3 MG TABS Take 3 mg by mouth daily. 90 tablet 3  . predniSONE (STERAPRED UNI-PAK 21 TAB) 10 MG (21) TBPK tablet Take by mouth daily. 21 tablet 0  . Prucalopride Succinate (MOTEGRITY) 2 MG TABS Take 2 mg by mouth daily. 30 tablet 6  . rosuvastatin (CRESTOR) 20 MG tablet TAKE 1 TABLET BY MOUTH EVERYDAY AT BEDTIME 90 tablet 0  . Semaglutide, 1 MG/DOSE, (OZEMPIC, 1 MG/DOSE,) 2 MG/1.5ML SOPN Inject 1 mg into the skin once a week. 1.5 mL 5  . SUMAtriptan (IMITREX) 100 MG tablet Take one tablet onset of migraine. May repeat in 2 hours if headache persists or recurs (max of 2 in 24hours) 12  tablet 11  . tiZANidine (ZANAFLEX) 4 MG tablet Take 1 tablet (4 mg total) by mouth every 6 (six) hours as needed for muscle spasms. 30 tablet 6  . triamcinolone cream (KENALOG) 0.5 % Apply 1 application topically 2 (two) times daily. 30 g 0  . venlafaxine (EFFEXOR) 37.5 MG tablet Take 1 tablet (37.5 mg total) by mouth daily. 90 tablet 4  . verapamil (CALAN-SR) 240 MG CR tablet Take 1 tablet (240 mg total) by mouth at bedtime. 90 tablet 4   No current facility-administered medications for this visit.    Allergies  Allergen Reactions  . Bee Venom Anaphylaxis     Review of Systems: All systems reviewed and negative except where noted in HPI.   Lab Results  Component Value Date   WBC 6.4 02/28/2019   HGB 11.1 (L) 02/28/2019   HCT 34.4 (L) 02/28/2019   MCV 89.5 02/28/2019   PLT 242.0 02/28/2019    . Lab Results  Component Value Date   CREATININE 1.28 (H) 02/28/2019   BUN 17 02/28/2019   NA 139 02/28/2019   K 4.4 02/28/2019   CL 106 02/28/2019   CO2 25 02/28/2019    Lab Results  Component Value Date   ALT 13 02/28/2019   AST 13 02/28/2019   ALKPHOS 49 02/28/2019   BILITOT 0.4 02/28/2019     Physical Exam: BP 130/80 (BP Location: Left Arm, Patient Position: Sitting, Cuff Size: Normal)   Pulse 92   Temp 97.9 F (36.6 C)   Ht 5\' 7"  (1.702 m)   Wt 242 lb 8 oz (110 kg)   SpO2 99%   BMI 37.98 kg/m  Constitutional: Pleasant,well-developed,female in no acute distress. HEENT: Normocephalic and atraumatic. Conjunctivae are normal. No scleral icterus. Neck supple.  Cardiovascular: Normal rate, regular rhythm.  Pulmonary/chest: Effort normal and breath sounds normal. No wheezing, rales or rhonchi.  Abdominal: Soft, nondistended, nontender. . There are no masses palpable. No hepatomegaly. Extremities: no edema Lymphadenopathy: No cervical adenopathy noted. Neurological: Alert and oriented to person place and time. Skin: Skin is warm and dry. No rashes noted. Psychiatric:  Normal mood and affect. Behavior is normal.   ASSESSMENT AND PLAN: 63 year old female here for reassessment the following:  Abdominal pain / early satiety - this pain is different from her prior dyspeptic symptoms which were previously controlled buspirone.  Discomfort is in her epigastric and periumbilical area.  She has reliable worsening postprandially. Ozempic can cause some GI distress but she thinks symptoms started prior to her starting this regimen. Prior EGD looked okay and she is on high-dose PPI, I think the risk of PUD is low.  She is status post cholecystectomy.  In light of her underlying diabetes, in light of her early satiety, gastroparesis is quite possible although again her pains a bit lower than would expect for this.  We discussed differential diagnosis and work-up options.  She is quite fearful that she may have pancreatic cancer although she has no family history for that, but she has had no prior cross-sectional imaging that I can see.  We discussed options for working this up, for peace of mind to ensure no evidence of malignancy given that is her major concern and to further evaluate this initially we will proceed with a CT scan of the abdomen pelvis with contrast.  We will check baseline labs today to ensure stable and normal.  If CT scan is negative, suspicion for gastroparesis is higher and may perform a gastric emptying study.  I will empirically give her some Carafate in the interim to see if that helps at all.  Further recommendations pending results of her CT scan and course.  She agreed  Chronic constipation - we will continue Trulance, working quite well for her and she is happy with the plan.  Tyhee Cellar, MD [facility]

## 2019-03-01 ENCOUNTER — Telehealth: Payer: Self-pay | Admitting: *Deleted

## 2019-03-01 NOTE — Telephone Encounter (Signed)
PA for Aimovig started on covermymeds ([id number]).  Case [id number].  Pt has coverage through [employer] [id number]).  Approved through 02/29/2020.

## 2019-03-02 ENCOUNTER — Telehealth: Payer: Self-pay

## 2019-03-02 NOTE — Telephone Encounter (Signed)
See result note.  

## 2019-03-02 NOTE — Telephone Encounter (Signed)
Left message to please call back. °

## 2019-03-02 NOTE — Telephone Encounter (Signed)
Pt returned your call.  

## 2019-03-06 ENCOUNTER — Ambulatory Visit (HOSPITAL_COMMUNITY)
Admission: RE | Admit: 2019-03-06 | Discharge: 2019-03-06 | Disposition: A | Discharge status: Home / Self Care | Payer: Medicare Other | Source: Ambulatory Visit | Attending: Gastroenterology | Admitting: Gastroenterology

## 2019-03-06 ENCOUNTER — Ambulatory Visit (HOSPITAL_COMMUNITY): Payer: Medicare Other

## 2019-03-06 ENCOUNTER — Other Ambulatory Visit: Payer: Self-pay

## 2019-03-06 DIAGNOSIS — K5909 Other constipation: Secondary | ICD-10-CM | POA: Diagnosis present

## 2019-03-06 DIAGNOSIS — R6881 Early satiety: Secondary | ICD-10-CM | POA: Diagnosis present

## 2019-03-06 DIAGNOSIS — R1010 Upper abdominal pain, unspecified: Secondary | ICD-10-CM

## 2019-03-06 MED ORDER — SODIUM CHLORIDE (PF) 0.9 % IJ SOLN
INTRAMUSCULAR | Status: AC
  Filled 2019-03-06: qty 50

## 2019-03-06 MED ORDER — IOHEXOL 300 MG/ML  SOLN
100.0000 mL | Freq: Once | INTRAMUSCULAR | Status: AC | PRN
  Administered 2019-03-06: 100 mL via INTRAVENOUS

## 2019-03-08 ENCOUNTER — Telehealth: Payer: Self-pay

## 2019-03-08 ENCOUNTER — Other Ambulatory Visit: Payer: Self-pay

## 2019-03-08 ENCOUNTER — Ambulatory Visit: Payer: BLUE CROSS/BLUE SHIELD | Admitting: Vascular Surgery

## 2019-03-08 MED ORDER — SUCRALFATE 1 G PO TABS: 1.0000 g | ORAL_TABLET | Freq: Four times a day (QID) | ORAL | 1 refills | Status: DC | PRN

## 2019-03-08 NOTE — Telephone Encounter (Signed)
Left message to please call back. °

## 2019-03-12 ENCOUNTER — Other Ambulatory Visit: Payer: Self-pay

## 2019-03-12 DIAGNOSIS — R1010 Upper abdominal pain, unspecified: Secondary | ICD-10-CM

## 2019-03-12 DIAGNOSIS — R6881 Early satiety: Secondary | ICD-10-CM

## 2019-03-21 ENCOUNTER — Ambulatory Visit: Payer: Medicare Other | Admitting: Medical

## 2019-03-27 ENCOUNTER — Encounter (HOSPITAL_COMMUNITY)
Admission: RE | Admit: 2019-03-27 | Discharge: 2019-03-27 | Disposition: A | Discharge status: Home / Self Care | Payer: Medicare Other | Source: Ambulatory Visit | Attending: Gastroenterology | Admitting: Gastroenterology

## 2019-03-27 ENCOUNTER — Other Ambulatory Visit: Payer: Self-pay

## 2019-03-27 DIAGNOSIS — R1010 Upper abdominal pain, unspecified: Secondary | ICD-10-CM | POA: Diagnosis not present

## 2019-03-27 DIAGNOSIS — R6881 Early satiety: Secondary | ICD-10-CM | POA: Diagnosis present

## 2019-03-27 MED ORDER — TECHNETIUM TC 99M SULFUR COLLOID
2.0000 | Freq: Once | INTRAVENOUS | Status: AC | PRN
  Administered 2019-03-27: 2 via INTRAVENOUS

## 2019-03-28 ENCOUNTER — Telehealth: Payer: Self-pay

## 2019-03-28 NOTE — Telephone Encounter (Signed)
Left message for patient to please call back. 

## 2019-04-04 ENCOUNTER — Telehealth: Payer: Self-pay

## 2019-04-04 NOTE — Telephone Encounter (Signed)
Attempted PA for Trulance but Insurance information we have on file for pt does show active.  Called and LM for pt to call back to discuss current prescription drug insurance information and PA for Trulance.

## 2019-04-05 ENCOUNTER — Other Ambulatory Visit: Payer: Self-pay | Admitting: *Deleted

## 2019-04-05 ENCOUNTER — Encounter: Payer: Self-pay | Admitting: Vascular Surgery

## 2019-04-05 ENCOUNTER — Other Ambulatory Visit: Payer: Self-pay

## 2019-04-05 ENCOUNTER — Telehealth: Payer: Self-pay | Admitting: Medical

## 2019-04-05 ENCOUNTER — Ambulatory Visit (INDEPENDENT_AMBULATORY_CARE_PROVIDER_SITE_OTHER): Payer: Medicare Other | Admitting: Vascular Surgery

## 2019-04-05 VITALS — BP 153/71 | HR 73 | Temp 97.7°F | Resp 18 | Ht 68.0 in | Wt 234.0 lb

## 2019-04-05 DIAGNOSIS — I83899 Varicose veins of unspecified lower extremities with other complications: Secondary | ICD-10-CM | POA: Diagnosis not present

## 2019-04-05 DIAGNOSIS — I83813 Varicose veins of bilateral lower extremities with pain: Secondary | ICD-10-CM

## 2019-04-05 DIAGNOSIS — I872 Venous insufficiency (chronic) (peripheral): Secondary | ICD-10-CM | POA: Diagnosis not present

## 2019-04-05 NOTE — Progress Notes (Addendum)
Patient name: Emma [name] MRN: [id number] DOB: [birth year] Sex: female  REASON FOR VISIT:   Follow-up of varicose veins  HPI:   Emma [name] is a pleasant 64 y.o. female who was seen by Dr. Servando Snare on 11/24/2018.  She had swelling of both lower extremities especially in her feet.  She had multiple varicose veins which were symptomatic.  She had palpable posterior tibial pulses bilaterally.  Her noninvasive study showed dilated varicose veins bilaterally with reflux.  It was recommended that she wear thigh-high compression stockings with a gradient of 20 to 30 mmHg.  She was encouraged to elevate her legs and take ibuprofen as needed for pain.  Since she was seen last, she continues to have some aching pain in her medial thighs bilaterally which is aggravated by standing and sitting and relieved with elevation.  She has been wearing her thigh-high compression stockings which helped some.  She takes gabapentin for pain.  She denies any previous history of DVT.  She is undergone previous stab phlebectomies and sclerotherapy elsewhere.  She did have something drop in her left foot but apparently this was x-rayed and showed no fracture.  She has some mild erythema just above her ankle.   Past Medical History:  Diagnosis Date  . Chronic constipation   . Cluster headache syndrome 04/27/13  . Colon polyp 06/11/2011   Most recent 04/23/08, no polyp--mild diverticulosis.  Repeat 10 yrs.  . Depression with anxiety 02/22/2008   Qualifier: Diagnosis of  By: Nolon Rod CMA (AAMA), Robin    . Diabetes mellitus without complication ([place])   . Diverticulosis   . GERD (gastroesophageal reflux disease) 06/11/2011  . Hypertension    Recently diagnosed  . Migraine syndrome   . Osteopenia 11/2016   bone density. plan repeat in 11/2018  . Overweight(278.02) 06/11/2011  . PUD (peptic ulcer disease) 04/23/08   Multiple antrum ulcers; h pylori neg  . Rheumatoid arthritis(714.0)     Family History    Problem Relation Age of Onset  . Diabetes Mother   . Stroke Mother   . Arthritis Father   . Hypertension Father   . Atrial fibrillation Father   . Alcohol abuse Sister   . Cancer Sister        mesothelioma  . Heart disease Sister        cardiomegaly  . Hypertension Sister   . Pulmonary embolism Sister   . Hypertension Brother   . Polycystic ovary syndrome Daughter   . Heart disease Maternal Grandmother   . Heart disease Maternal Grandfather   . Colon cancer Paternal Uncle   . Esophageal cancer Neg Hx   . Stomach cancer Neg Hx     SOCIAL HISTORY: Social History   Tobacco Use  . Smoking status: Never Smoker  . Smokeless tobacco: Never Used  Substance Use Topics  . Alcohol use: Not Currently    Allergies  Allergen Reactions  . Bee Venom Anaphylaxis    Current Outpatient Medications  Medication Sig Dispense Refill  . acetaminophen (TYLENOL) 500 MG tablet Take 1,500 mg by mouth once as needed for mild pain or moderate pain.    Marland Kitchen ALPRAZolam (XANAX) 0.5 MG tablet TAKE 1 TABLET BY MOUTH EVERYDAY AT BEDTIME 30 tablet 1  . AMBULATORY NON FORMULARY MEDICATION Medication Name: *Nitroglycerin 0.125% gel. Apply a pea size amount to your rectum three times daily x 6-8 weeks (Patient taking differently: Place 1 application rectally See admin instructions. Medication Name: *Nitroglycerin 0.125% gel. Apply  a pea size amount to your rectum three times daily x 6-8 weeks) 30 g 0  . BIOTIN PO Take 1 capsule by mouth daily.    . busPIRone (BUSPAR) 7.5 MG tablet TAKE 1 TABLET (7.5 MG TOTAL) BY MOUTH 2 (TWO) TIMES DAILY. 180 tablet 1  . calcium carbonate ([employer] CALCIUM) 1500 (600 Ca) MG TABS tablet TAKE 1 TABLET (600 MG TOTAL) BY MOUTH 2 (TWO) TIMES DAILY WITH A MEAL. 150 tablet 1  . carisoprodol (SOMA) 350 MG tablet TAKE 1 TABLET BY MOUTH TWICE A DAY AND AT BEDTIME AS NEEDED FOR MUSCLE SPASMS  0  . Cholecalciferol (VITAMIN D3) 250 MCG (10000 UT) TABS Take 1 tablet by mouth daily. 90 tablet 3   . dicyclomine (BENTYL) 10 MG capsule Take 1-2 capsules (10-20 mg total) by mouth every 8 (eight) hours as needed for spasms. 120 capsule 1  . diphenhydrAMINE (BENADRYL) 25 mg capsule Take 50 mg by mouth once as needed for itching or allergies.    Marland Kitchen EPINEPHrine 0.3 mg/0.3 mL IJ SOAJ injection Inject 0.3 mg into the muscle as needed.    Eduard Roux (AIMOVIG) 70 MG/ML SOAJ Inject 70 mg into the skin every 30 (thirty) days. 1 pen 11  . etodolac (LODINE) 400 MG tablet TAKE 1 TABLET BY MOUTH TWICE A DAY WITH FOOD AS NEEDED FOR PAIN  1  . gabapentin (NEURONTIN) 300 MG capsule TAKE 1 CAPSULE (300 MG TOTAL) BY MOUTH 3 (THREE) TIMES DAILY AS NEEDED. 270 capsule 0  . Galcanezumab-gnlm (EMGALITY) 120 MG/ML SOSY Inject 120 mg into the skin every 30 (thirty) days. 1 Syringe 8  . HYDROcodone-acetaminophen (NORCO) 7.5-325 MG per tablet Take 1 tablet by mouth daily as needed for moderate pain. Reported on 09/05/2015    . hydroxychloroquine (PLAQUENIL) 200 MG tablet Take 400 mg by mouth daily.  2  . hydrOXYzine (ATARAX/VISTARIL) 25 MG tablet Take 1 tablet (25 mg total) by mouth every 8 (eight) hours as needed for itching. 30 tablet 0  . Insulin Pen Needle (B-D ULTRAFINE III SHORT PEN) 31G X 8 MM MISC USE AS DIRECTED 100 each 11  . lisinopril (ZESTRIL) 20 MG tablet TAKE 1 TABLET BY MOUTH EVERY DAY 90 tablet 0  . lubiprostone (AMITIZA) 24 MCG capsule Take 1 capsule (24 mcg total) by mouth 2 (two) times daily with a meal. 60 capsule 3  . metFORMIN (GLUCOPHAGE) 500 MG tablet TAKE 1 TABLET BY MOUTH EVERY DAY WITH BREAKFAST 90 tablet 0  . Multiple Vitamins-Iron (MULTIVITAMINS WITH IRON) TABS tablet Take 1 tablet by mouth daily. 90 tablet 0  . omeprazole (PRILOSEC) 40 MG capsule TAKE 1 CAPSULE BY MOUTH EVERY DAY 90 capsule 0  . ondansetron (ZOFRAN ODT) 4 MG disintegrating tablet Take 1 tablet (4 mg total) by mouth every 8 (eight) hours as needed. 20 tablet 6  . Plecanatide (TRULANCE) 3 MG TABS Take 3 mg by mouth daily.  90 tablet 3  . predniSONE (STERAPRED UNI-PAK 21 TAB) 10 MG (21) TBPK tablet Take by mouth daily. 21 tablet 0  . Prucalopride Succinate (MOTEGRITY) 2 MG TABS Take 2 mg by mouth daily. 30 tablet 6  . rosuvastatin (CRESTOR) 20 MG tablet TAKE 1 TABLET BY MOUTH EVERYDAY AT BEDTIME 90 tablet 0  . Semaglutide, 1 MG/DOSE, (OZEMPIC, 1 MG/DOSE,) 2 MG/1.5ML SOPN Inject 1 mg into the skin once a week. 1.5 mL 5  . sucralfate (CARAFATE) 1 g tablet Take 1 tablet (1 g total) by mouth every 6 (six) hours as  needed. 90 tablet 1  . SUMAtriptan (IMITREX) 100 MG tablet Take one tablet onset of migraine. May repeat in 2 hours if headache persists or recurs (max of 2 in 24hours) 12 tablet 11  . tiZANidine (ZANAFLEX) 4 MG tablet Take 1 tablet (4 mg total) by mouth every 6 (six) hours as needed for muscle spasms. 30 tablet 6  . triamcinolone cream (KENALOG) 0.5 % Apply 1 application topically 2 (two) times daily. 30 g 0  . venlafaxine (EFFEXOR) 37.5 MG tablet Take 1 tablet (37.5 mg total) by mouth daily. 90 tablet 4  . verapamil (CALAN-SR) 240 MG CR tablet Take 1 tablet (240 mg total) by mouth at bedtime. 90 tablet 4   No current facility-administered medications for this visit.    REVIEW OF SYSTEMS:  [X]  denotes positive finding, [ ]  denotes negative finding Cardiac  Comments:  Chest pain or chest pressure:    Shortness of breath upon exertion:    Short of breath when lying flat:    Irregular heart rhythm:        Vascular    Pain in calf, thigh, or hip brought on by ambulation:    Pain in feet at night that wakes you up from your sleep:  x   Blood clot in your veins:    Leg swelling:  x       Pulmonary    Oxygen at home:    Productive cough:     Wheezing:         Neurologic    Sudden weakness in arms or legs:     Sudden numbness in arms or legs:     Sudden onset of difficulty speaking or slurred speech:    Temporary loss of vision in one eye:     Problems with dizziness:         Gastrointestinal     Blood in stool:     Vomited blood:         Genitourinary    Burning when urinating:     Blood in urine:        Psychiatric    Major depression:         Hematologic    Bleeding problems:    Problems with blood clotting too easily:        Skin    Rashes or ulcers:        Constitutional    Fever or chills:     PHYSICAL EXAM:   Vitals:   04/05/19 1307  BP: (!) 153/71  Pulse: 73  Resp: 18  Temp: 97.7 F (36.5 C)  TempSrc: Temporal  SpO2: 97%  Weight: 234 lb (106.1 kg)  Height: 5\' 8"  (1.727 m)    GENERAL: The patient is a well-nourished female, in no acute distress. The vital signs are documented above. CARDIAC: There is a regular rate and rhythm.  VASCULAR: I do not detect carotid bruits. She has palpable dorsalis pedis pulses bilaterally. She has bilateral lower extremity swelling which is more significant on the left side.  She has some mild cellulitis just above her ankle but no significant hyperpigmentation. I looked at both great saphenous veins myself with the SonoSite. On the left side the vein has reflux from the saphenofemoral junction to the proximal calf and is significantly dilated.  In the mid thigh the vein was slightly thickened adjacent to some varicose veins. On the right side, there was significant reflux from the saphenofemoral junction of the proximal calf.  The vein  was significantly dilated. I think on both sides the vein could be cannulated in the proximal calf or around the knee. PULMONARY: There is good air exchange bilaterally without wheezing or rales. ABDOMEN: Soft and non-tender with normal pitched bowel sounds.  MUSCULOSKELETAL: There are no major deformities or cyanosis. NEUROLOGIC: No focal weakness or paresthesias are detected. SKIN: There is some mild cellulitis just above the left ankle.    PSYCHIATRIC: The patient has a normal affect.  DATA:    VENOUS DUPLEX: I have reviewed the venous duplex scan that was done in September of  last year.  On the right side there was no evidence of DVT.  There was deep venous reflux involving the common femoral vein.  There was superficial venous reflux in the great saphenous vein from the saphenofemoral junction to the proximal calf.  The vein was significantly dilated up to 0.55 cm.  On the left side there was no evidence of DVT.  There was deep venous reflux involving the common femoral vein and femoral vein.  There was superficial venous reflux involving the left great saphenous vein from the saphenofemoral junction to the proximal calf.  The vein was dilated up to 0.55 cm.   MEDICAL ISSUES:   CHRONIC VENOUS INSUFFICIENCY: This patient has significant superficial venous reflux bilaterally and also deep venous reflux on the left.  She has significant aching pain and heaviness in her legs and also significant problems with swelling.  She has CEAP C3 venous disease.  She has failed conservative treatment including thigh-high compression stockings, leg elevation, and pain medicines as needed.  I think she would be a good candidate for endovenous laser ablation of the left great saphenous vein and then subsequent endovenous laser ablation of the right great saphenous vein in a staged fashion.  I have discussed the indications for endovenous laser ablation of the bilat. GSV, that is to lower the pressure in the veins and potentially help relieve the symptoms from venous hypertension. I have also discussed alternative options including conservative treatment with leg elevation, compression therapy, exercise, avoiding prolonged sitting and standing, and weight management. I have discussed the potential complications of the procedure, including, but not limited to: bleeding, bruising, leg swelling, nerve injury, skin burns, significant pain from phlebitis, deep venous thrombosis, or failure of the vein to close.  I have also explained that venous insufficiency is a chronic disease, and that the  patient is at risk for recurrent varicose veins in the future.  All of the patient's questions were encouraged and answered. They are agreeable to proceed.   We will try to schedule her procedure in the near future.  Deitra Mayo [facility] [phone]

## 2019-04-05 NOTE — Telephone Encounter (Signed)
Get in for medicare well visit/CPX fasting

## 2019-04-06 NOTE — Telephone Encounter (Signed)
Called and left pt Vm to call the office to schedule

## 2019-04-09 NOTE — Telephone Encounter (Signed)
Re-attempted PA with current insurance information ID: [id number] for Trulance 3mg . Received approval: "This request has been approved using information available on the patient's profile. CaseId:[id number];Status:Approved;Review Type:Prior Auth;Coverage Start Date:03/23/2019;Coverage End Date:04/08/2020"

## 2019-04-12 ENCOUNTER — Other Ambulatory Visit: Payer: Medicare Other | Admitting: Vascular Surgery

## 2019-04-19 ENCOUNTER — Other Ambulatory Visit: Payer: Self-pay | Admitting: Medical

## 2019-04-19 ENCOUNTER — Ambulatory Visit (HOSPITAL_COMMUNITY): Payer: Medicare Other

## 2019-04-19 ENCOUNTER — Ambulatory Visit: Payer: Medicare Other | Admitting: Vascular Surgery

## 2019-04-19 NOTE — Telephone Encounter (Signed)
Is this okay to refill? 

## 2019-04-23 ENCOUNTER — Other Ambulatory Visit: Payer: Self-pay | Admitting: Medical

## 2019-04-23 NOTE — Telephone Encounter (Signed)
Needs appt.  Get on appt and I'll send refill

## 2019-04-24 NOTE — Telephone Encounter (Signed)
Patient has appt for 05/25/19/. Please fill script.

## 2019-05-24 ENCOUNTER — Other Ambulatory Visit: Payer: Self-pay | Admitting: Medical

## 2019-05-24 NOTE — Telephone Encounter (Signed)
Pt has an appt Friday 3/5

## 2019-05-25 ENCOUNTER — Ambulatory Visit: Payer: Medicare Other | Admitting: Medical

## 2019-05-31 ENCOUNTER — Other Ambulatory Visit: Payer: Self-pay | Admitting: Neurology

## 2019-06-07 ENCOUNTER — Encounter: Payer: Self-pay | Admitting: Vascular Surgery

## 2019-06-07 ENCOUNTER — Ambulatory Visit (INDEPENDENT_AMBULATORY_CARE_PROVIDER_SITE_OTHER): Payer: Medicare Other | Admitting: Vascular Surgery

## 2019-06-07 ENCOUNTER — Other Ambulatory Visit: Payer: Self-pay

## 2019-06-07 VITALS — BP 98/65 | HR 61 | Temp 97.4°F | Resp 14 | Ht 68.0 in | Wt 240.0 lb

## 2019-06-07 DIAGNOSIS — I872 Venous insufficiency (chronic) (peripheral): Secondary | ICD-10-CM | POA: Diagnosis not present

## 2019-06-07 HISTORY — PX: ENDOVENOUS ABLATION SAPHENOUS VEIN W/ LASER: SUR449

## 2019-06-07 NOTE — Progress Notes (Signed)
     Laser Ablation Procedure    Date: 06/07/2019   Emma [name] DOB:[birth year]  Consent signed: Yes    Surgeon: Gae Gallop MD  Procedure: Laser Ablation: left Greater Saphenous Vein  BP 98/65 (BP Location: Left Arm, Patient Position: Sitting, Cuff Size: Large)   Pulse 61   Temp (!) 97.4 F (36.3 C) (Temporal)   Resp 14   Ht 5\' 8"  (1.727 m)   Wt 240 lb (108.9 kg)   SpO2 99%   BMI 36.49 kg/m   Tumescent Anesthesia: 500 cc 0.9% NaCl with 50 cc Lidocaine HCL 1%  and 15 cc 8.4% NaHCO3  Local Anesthesia: 6 cc Lidocaine HCL and NaHCO3 (ratio 2:1)  7 watts continuous mode        Total energy: 1345 Joules    Total time: 192 seconds                                       Treatment Length:  34 cm      Stab Phlebectomy: 4 Sites: Thigh  Patient tolerated procedure well  Notes: Patient wore face mask.  All staff members wore facial masks and facial shields/goggles.    Description of Procedure:  After marking the course of the secondary varicosities, the patient was placed on the operating table in the supine position, and the left leg was prepped and draped in sterile fashion.   Local anesthetic was administered and under ultrasound guidance the saphenous vein was accessed with a micro needle and guide wire; then the mirco puncture sheath was placed.  A guide wire was inserted saphenofemoral junction , followed by a 5 french sheath.  The position of the sheath and then the laser fiber below the junction was confirmed using the ultrasound.  Tumescent anesthesia was administered along the course of the saphenous vein using ultrasound guidance. The patient was placed in Trendelenburg position and protective laser glasses were placed on patient and staff, and the laser was fired at 7 watts continuous mode for a total of 1345 joules.   For stab phlebectomies, local anesthetic was administered at the previously marked varicosities, and tumescent anesthesia was administered around the  vessels.  Four stab wounds were made using the tip of an 11 blade. And using the vein hook, the phlebectomies were performed using a hemostat to avulse the varicosities.  Adequate hemostasis was achieved.     Steri strips were applied to the stab wounds and ABD pads and thigh high compression stockings were applied.  Ace wrap bandages were applied over the phlebectomy sites and at the top of the saphenofemoral junction. Blood loss was less than 15 cc.  Discharge instructions reviewed with patient and hardcopy of discharge instructions given to patient to take home. The patient ambulated out of the operating room having tolerated the procedure well.

## 2019-06-07 NOTE — Progress Notes (Signed)
Patient name: Emma [name] MRN: [id number] DOB: 1955/06/08 Sex: female  REASON FOR VISIT: For endovenous laser ablation of the left great saphenous vein  HPI: Emma [name] is a 64 y.o. female who had presented with swelling of both lower extremities and significant venous insufficiency.  She failed conservative treatment including thigh-high compression stockings with a gradient of 20 to 30 mmHg, leg elevation, and ibuprofen as needed for pain.  She has no previous history of DVT.  She has undergone previous stab phlebectomies and sclerotherapy elsewhere.  On my exam the patient had palpable pedal pulses.  I looked at both great saphenous veins myself with the SonoSite.  On the left side there was reflux from the saphenofemoral junction of the proximal calf and the vein was significantly dilated.  On the right side he also had significant reflux from the saphenofemoral junction to the proximal calf.  Patient had significant superficial venous reflux bilaterally and I felt she was not a candidate for staged laser ablation of the great saphenous veins.  She presents for laser ablation of the left great saphenous vein.  Current Outpatient Medications  Medication Sig Dispense Refill  . acetaminophen (TYLENOL) 500 MG tablet Take 1,500 mg by mouth once as needed for mild pain or moderate pain.    Marland Kitchen ALPRAZolam (XANAX) 0.5 MG tablet TAKE 1 TABLET BY MOUTH EVERYDAY AT BEDTIME 30 tablet 0  . AMBULATORY NON FORMULARY MEDICATION Medication Name: *Nitroglycerin 0.125% gel. Apply a pea size amount to your rectum three times daily x 6-8 weeks (Patient taking differently: Place 1 application rectally See admin instructions. Medication Name: *Nitroglycerin 0.125% gel. Apply a pea size amount to your rectum three times daily x 6-8 weeks) 30 g 0  . BIOTIN PO Take 1 capsule by mouth daily.    . busPIRone (BUSPAR) 7.5 MG tablet TAKE 1 TABLET (7.5 MG TOTAL) BY MOUTH 2 (TWO) TIMES DAILY. 180 tablet 1  . calcium  carbonate ([employer] CALCIUM) 1500 (600 Ca) MG TABS tablet TAKE 1 TABLET (600 MG TOTAL) BY MOUTH 2 (TWO) TIMES DAILY WITH A MEAL. 150 tablet 1  . carisoprodol (SOMA) 350 MG tablet TAKE 1 TABLET BY MOUTH TWICE A DAY AND AT BEDTIME AS NEEDED FOR MUSCLE SPASMS  0  . Cholecalciferol (VITAMIN D3) 250 MCG (10000 UT) TABS Take 1 tablet by mouth daily. 90 tablet 3  . dicyclomine (BENTYL) 10 MG capsule Take 1-2 capsules (10-20 mg total) by mouth every 8 (eight) hours as needed for spasms. 120 capsule 1  . diphenhydrAMINE (BENADRYL) 25 mg capsule Take 50 mg by mouth once as needed for itching or allergies.    Marland Kitchen EPINEPHrine 0.3 mg/0.3 mL IJ SOAJ injection Inject 0.3 mg into the muscle as needed.    Eduard Roux (AIMOVIG) 70 MG/ML SOAJ Inject 70 mg into the skin every 30 (thirty) days. 1 pen 11  . etodolac (LODINE) 400 MG tablet TAKE 1 TABLET BY MOUTH TWICE A DAY WITH FOOD AS NEEDED FOR PAIN  1  . gabapentin (NEURONTIN) 300 MG capsule TAKE 1 CAPSULE (300 MG TOTAL) BY MOUTH 3 (THREE) TIMES DAILY AS NEEDED. 270 capsule 0  . Galcanezumab-gnlm (EMGALITY) 120 MG/ML SOSY Inject 120 mg into the skin every 30 (thirty) days. 1 Syringe 8  . HYDROcodone-acetaminophen (NORCO) 7.5-325 MG per tablet Take 1 tablet by mouth daily as needed for moderate pain. Reported on 09/05/2015    . hydroxychloroquine (PLAQUENIL) 200 MG tablet Take 400 mg by mouth daily.  2  .  hydrOXYzine (ATARAX/VISTARIL) 25 MG tablet Take 1 tablet (25 mg total) by mouth every 8 (eight) hours as needed for itching. 30 tablet 0  . Insulin Pen Needle (B-D ULTRAFINE III SHORT PEN) 31G X 8 MM MISC USE AS DIRECTED 100 each 11  . lisinopril (ZESTRIL) 20 MG tablet TAKE 1 TABLET BY MOUTH EVERY DAY 90 tablet 0  . lubiprostone (AMITIZA) 24 MCG capsule Take 1 capsule (24 mcg total) by mouth 2 (two) times daily with a meal. 60 capsule 3  . metFORMIN (GLUCOPHAGE) 500 MG tablet TAKE 1 TABLET BY MOUTH EVERY DAY WITH BREAKFAST 90 tablet 0  . Multiple Vitamins-Iron  (MULTIVITAMINS WITH IRON) TABS tablet Take 1 tablet by mouth daily. 90 tablet 0  . omeprazole (PRILOSEC) 40 MG capsule TAKE 1 CAPSULE BY MOUTH EVERY DAY 90 capsule 0  . ondansetron (ZOFRAN ODT) 4 MG disintegrating tablet Take 1 tablet (4 mg total) by mouth every 8 (eight) hours as needed. 20 tablet 6  . Plecanatide (TRULANCE) 3 MG TABS Take 3 mg by mouth daily. 90 tablet 3  . Prucalopride Succinate (MOTEGRITY) 2 MG TABS Take 2 mg by mouth daily. 30 tablet 6  . rosuvastatin (CRESTOR) 20 MG tablet TAKE 1 TABLET BY MOUTH EVERYDAY AT BEDTIME 90 tablet 0  . Semaglutide, 1 MG/DOSE, (OZEMPIC, 1 MG/DOSE,) 2 MG/1.5ML SOPN Inject 1 mg into the skin once a week. 1.5 mL 5  . sucralfate (CARAFATE) 1 g tablet Take 1 tablet (1 g total) by mouth every 6 (six) hours as needed. 90 tablet 1  . SUMAtriptan (IMITREX) 100 MG tablet Take one tablet onset of migraine. May repeat in 2 hours if headache persists or recurs (max of 2 in 24hours) 12 tablet 11  . tiZANidine (ZANAFLEX) 4 MG tablet Take 1 tablet (4 mg total) by mouth every 6 (six) hours as needed for muscle spasms. 30 tablet 6  . triamcinolone cream (KENALOG) 0.5 % Apply 1 application topically 2 (two) times daily. 30 g 0  . venlafaxine (EFFEXOR) 37.5 MG tablet Take 1 tablet (37.5 mg total) by mouth daily. 90 tablet 4  . verapamil (CALAN-SR) 240 MG CR tablet Take 1 tablet (240 mg total) by mouth at bedtime. 90 tablet 4  . predniSONE (STERAPRED UNI-PAK 21 TAB) 10 MG (21) TBPK tablet Take by mouth daily. (Patient not taking: Reported on 06/07/2019) 21 tablet 0   No current facility-administered medications for this visit.    PHYSICAL EXAM: Vitals:   06/07/19 1049  BP: 98/65  Pulse: 61  Resp: 14  Temp: (!) 97.4 F (36.3 C)  TempSrc: Temporal  SpO2: 99%  Weight: 240 lb (108.9 kg)  Height: 5\' 8"  (1.727 m)    PROCEDURE: ENDOVENOUS LASER ABLATION LEFT GREAT SAPHENOUS VEIN  TECHNIQUE: The patient was taken to the exam room and placed supine.  I looked at  the left great saphenous vein myself with the SonoSite and it looks like I could cannulate this just above the knee.  The left leg was prepped and draped in usual sterile fashion.  After the skin was anesthetized with 1% lidocaine, under ultrasound guidance, I cannulated the left great saphenous vein just above the knee with a micropuncture needle and a micropuncture sheath was introduced over a wire.  I then advanced the J-wire to just below the saphenofemoral junction.  The 45 cm sheath was advanced over the wire and then the wire and dilator removed.  I then positioned the laser fiber at the end of the sheath  and then retracted the sheath to expose the laser fiber.  This was positioned 2-1/2 to 3 cm distal to the saphenofemoral junction.  A measurement was obtained.  Next tumescent anesthesia was administered the entire length of the vein circumferentially.  The patient was placed in Trendelenburg and laser ablation was performed of the left great saphenous vein from 3 cm distal to the saphenofemoral junction to just above the knee.  34 cm were treated, 1345 J were used.  Next, tumescent anesthesia was administered over several small clusters of veins adjacent to the saphenous vein and using 4 small stab incisions staff with ectomy's were performed and these veins were excised bluntly and pressure held for hemostasis.  Sterile dressing was applied.  Patient tolerated procedure well.  She will return in 1 week for follow-up duplex.  Deitra Mayo [facility] [place] [phone]

## 2019-06-13 ENCOUNTER — Other Ambulatory Visit: Payer: Self-pay | Admitting: *Deleted

## 2019-06-13 DIAGNOSIS — I83899 Varicose veins of unspecified lower extremities with other complications: Secondary | ICD-10-CM

## 2019-06-14 ENCOUNTER — Ambulatory Visit (HOSPITAL_COMMUNITY)
Admission: RE | Admit: 2019-06-14 | Discharge: 2019-06-14 | Disposition: A | Discharge status: Home / Self Care | Payer: Medicare Other | Source: Ambulatory Visit | Attending: Vascular Surgery | Admitting: Vascular Surgery

## 2019-06-14 ENCOUNTER — Ambulatory Visit (INDEPENDENT_AMBULATORY_CARE_PROVIDER_SITE_OTHER): Payer: Medicare Other | Admitting: Vascular Surgery

## 2019-06-14 ENCOUNTER — Other Ambulatory Visit: Payer: Self-pay

## 2019-06-14 ENCOUNTER — Encounter: Payer: Self-pay | Admitting: Vascular Surgery

## 2019-06-14 VITALS — BP 179/78 | HR 53 | Temp 97.8°F | Resp 16 | Ht 67.0 in | Wt 240.0 lb

## 2019-06-14 DIAGNOSIS — I83813 Varicose veins of bilateral lower extremities with pain: Secondary | ICD-10-CM

## 2019-06-14 DIAGNOSIS — I83892 Varicose veins of left lower extremities with other complications: Secondary | ICD-10-CM

## 2019-06-14 DIAGNOSIS — I872 Venous insufficiency (chronic) (peripheral): Secondary | ICD-10-CM | POA: Diagnosis not present

## 2019-06-14 DIAGNOSIS — I83899 Varicose veins of unspecified lower extremities with other complications: Secondary | ICD-10-CM | POA: Diagnosis not present

## 2019-06-14 NOTE — Progress Notes (Signed)
Patient name: Emma [name] MRN: [id number] DOB: [birth year] Sex: female  REASON FOR VISIT: Follow-up after endovenous laser ablation of the left great saphenous vein  HPI: Emma [name] is a 64 y.o. female who had presented with swelling of both lower extremities and had significant chronic venous insufficiency.  She failed conservative treatment and was felt to be a candidate for staged laser ablation of the great saphenous veins.  She underwent laser ablation of the left great saphenous vein on 06/07/2019 and returns for a 1 week follow-up visit.  The patient is doing well.  She still has some aching in the thigh.  She also has aching the top of her foot where she had dropped something on her foot 4 months ago and was told that she had a bruised bone.  She has been elevating her legs effectively and also wearing her compression stocking.  She is continuing to have symptoms in the right leg and would like to proceed with laser ablation of the right great saphenous vein.  She denies any chest pain or shortness of breath.  Current Outpatient Medications  Medication Sig Dispense Refill  . acetaminophen (TYLENOL) 500 MG tablet Take 1,500 mg by mouth once as needed for mild pain or moderate pain.    Marland Kitchen ALPRAZolam (XANAX) 0.5 MG tablet TAKE 1 TABLET BY MOUTH EVERYDAY AT BEDTIME 30 tablet 0  . AMBULATORY NON FORMULARY MEDICATION Medication Name: *Nitroglycerin 0.125% gel. Apply a pea size amount to your rectum three times daily x 6-8 weeks (Patient taking differently: Place 1 application rectally See admin instructions. Medication Name: *Nitroglycerin 0.125% gel. Apply a pea size amount to your rectum three times daily x 6-8 weeks) 30 g 0  . BIOTIN PO Take 1 capsule by mouth daily.    . busPIRone (BUSPAR) 7.5 MG tablet TAKE 1 TABLET (7.5 MG TOTAL) BY MOUTH 2 (TWO) TIMES DAILY. 180 tablet 1  . calcium carbonate ([employer] CALCIUM) 1500 (600 Ca) MG TABS tablet TAKE 1 TABLET (600 MG TOTAL) BY MOUTH 2 (TWO)  TIMES DAILY WITH A MEAL. 150 tablet 1  . carisoprodol (SOMA) 350 MG tablet TAKE 1 TABLET BY MOUTH TWICE A DAY AND AT BEDTIME AS NEEDED FOR MUSCLE SPASMS  0  . Cholecalciferol (VITAMIN D3) 250 MCG (10000 UT) TABS Take 1 tablet by mouth daily. 90 tablet 3  . dicyclomine (BENTYL) 10 MG capsule Take 1-2 capsules (10-20 mg total) by mouth every 8 (eight) hours as needed for spasms. 120 capsule 1  . diphenhydrAMINE (BENADRYL) 25 mg capsule Take 50 mg by mouth once as needed for itching or allergies.    Marland Kitchen EPINEPHrine 0.3 mg/0.3 mL IJ SOAJ injection Inject 0.3 mg into the muscle as needed.    Eduard Roux (AIMOVIG) 70 MG/ML SOAJ Inject 70 mg into the skin every 30 (thirty) days. 1 pen 11  . etodolac (LODINE) 400 MG tablet TAKE 1 TABLET BY MOUTH TWICE A DAY WITH FOOD AS NEEDED FOR PAIN  1  . gabapentin (NEURONTIN) 300 MG capsule TAKE 1 CAPSULE (300 MG TOTAL) BY MOUTH 3 (THREE) TIMES DAILY AS NEEDED. 270 capsule 0  . Galcanezumab-gnlm (EMGALITY) 120 MG/ML SOSY Inject 120 mg into the skin every 30 (thirty) days. 1 Syringe 8  . HYDROcodone-acetaminophen (NORCO) 7.5-325 MG per tablet Take 1 tablet by mouth daily as needed for moderate pain. Reported on 09/05/2015    . hydroxychloroquine (PLAQUENIL) 200 MG tablet Take 400 mg by mouth daily.  2  . hydrOXYzine (ATARAX/VISTARIL)  25 MG tablet Take 1 tablet (25 mg total) by mouth every 8 (eight) hours as needed for itching. 30 tablet 0  . Insulin Pen Needle (B-D ULTRAFINE III SHORT PEN) 31G X 8 MM MISC USE AS DIRECTED 100 each 11  . lisinopril (ZESTRIL) 20 MG tablet TAKE 1 TABLET BY MOUTH EVERY DAY 90 tablet 0  . lubiprostone (AMITIZA) 24 MCG capsule Take 1 capsule (24 mcg total) by mouth 2 (two) times daily with a meal. 60 capsule 3  . metFORMIN (GLUCOPHAGE) 500 MG tablet TAKE 1 TABLET BY MOUTH EVERY DAY WITH BREAKFAST 90 tablet 0  . Multiple Vitamins-Iron (MULTIVITAMINS WITH IRON) TABS tablet Take 1 tablet by mouth daily. 90 tablet 0  . omeprazole (PRILOSEC) 40  MG capsule TAKE 1 CAPSULE BY MOUTH EVERY DAY 90 capsule 0  . Plecanatide (TRULANCE) 3 MG TABS Take 3 mg by mouth daily. 90 tablet 3  . predniSONE (STERAPRED UNI-PAK 21 TAB) 10 MG (21) TBPK tablet Take by mouth daily. 21 tablet 0  . Prucalopride Succinate (MOTEGRITY) 2 MG TABS Take 2 mg by mouth daily. 30 tablet 6  . rosuvastatin (CRESTOR) 20 MG tablet TAKE 1 TABLET BY MOUTH EVERYDAY AT BEDTIME 90 tablet 0  . Semaglutide, 1 MG/DOSE, (OZEMPIC, 1 MG/DOSE,) 2 MG/1.5ML SOPN Inject 1 mg into the skin once a week. 1.5 mL 5  . sucralfate (CARAFATE) 1 g tablet Take 1 tablet (1 g total) by mouth every 6 (six) hours as needed. 90 tablet 1  . SUMAtriptan (IMITREX) 100 MG tablet Take one tablet onset of migraine. May repeat in 2 hours if headache persists or recurs (max of 2 in 24hours) 12 tablet 11  . tiZANidine (ZANAFLEX) 4 MG tablet Take 1 tablet (4 mg total) by mouth every 6 (six) hours as needed for muscle spasms. 30 tablet 6  . triamcinolone cream (KENALOG) 0.5 % Apply 1 application topically 2 (two) times daily. 30 g 0  . venlafaxine (EFFEXOR) 37.5 MG tablet Take 1 tablet (37.5 mg total) by mouth daily. 90 tablet 4  . verapamil (CALAN-SR) 240 MG CR tablet Take 1 tablet (240 mg total) by mouth at bedtime. 90 tablet 4  . ondansetron (ZOFRAN ODT) 4 MG disintegrating tablet Take 1 tablet (4 mg total) by mouth every 8 (eight) hours as needed. (Patient not taking: Reported on 06/14/2019) 20 tablet 6   No current facility-administered medications for this visit.   REVIEW OF SYSTEMS: Valu.Nieves ] denotes positive finding; [  ] denotes negative finding  CARDIOVASCULAR:  [ ]  chest pain   [ ]  dyspnea on exertion  [ ]  leg swelling  CONSTITUTIONAL:  [ ]  fever   [ ]  chills  PHYSICAL EXAM: Vitals:   06/14/19 1027  BP: (!) 179/78  Pulse: (!) 53  Resp: 16  Temp: 97.8 F (36.6 C)  TempSrc: Temporal  SpO2: 98%  Weight: 240 lb (108.9 kg)  Height: 5\' 7"  (1.702 m)   GENERAL: The patient is a well-nourished female, in no  acute distress. The vital signs are documented above. CARDIOVASCULAR: There is a regular rate and rhythm. PULMONARY: There is good air exchange bilaterally without wheezing or rales. VASCULAR: She has minimal bruising in the left thigh.  Her incision looks fine.  She has no significant leg swelling.  DATA:  VENOUS DUPLEX: I have independently interpreted her venous duplex scan today.  There is no evidence of DVT.  The left great saphenous vein is closed from 1 cm distal to the saphenofemoral junction  to the mid calf.  MEDICAL ISSUES:  S/P LASER ABLATION LEFT GREAT SAPHENOUS VEIN: Patient has done well status post laser ablation of the left great saphenous vein.  She has significant swelling and symptoms in the right leg also and is failed conservative treatment.  We will schedule her now for laser ablation of the right great saphenous vein.  Based on my previous duplex exam it looks like we would cannulate the great saphenous vein on the right in the proximal calf or around the knee.   Deitra Mayo [facility] [phone]

## 2019-06-18 ENCOUNTER — Other Ambulatory Visit: Payer: Self-pay | Admitting: *Deleted

## 2019-06-18 DIAGNOSIS — I83813 Varicose veins of bilateral lower extremities with pain: Secondary | ICD-10-CM

## 2019-06-21 ENCOUNTER — Other Ambulatory Visit: Payer: Self-pay | Admitting: Medical

## 2019-06-21 ENCOUNTER — Other Ambulatory Visit: Payer: Self-pay | Admitting: Gastroenterology

## 2019-06-29 ENCOUNTER — Other Ambulatory Visit: Payer: Self-pay | Admitting: Neurology

## 2019-07-10 ENCOUNTER — Encounter: Payer: Self-pay | Admitting: Medical

## 2019-07-10 ENCOUNTER — Ambulatory Visit (INDEPENDENT_AMBULATORY_CARE_PROVIDER_SITE_OTHER): Payer: Medicare Other | Admitting: Medical

## 2019-07-10 ENCOUNTER — Other Ambulatory Visit: Payer: Self-pay

## 2019-07-10 VITALS — BP 150/86 | HR 62 | Temp 97.9°F | Ht 68.0 in | Wt 246.8 lb

## 2019-07-10 DIAGNOSIS — I872 Venous insufficiency (chronic) (peripheral): Secondary | ICD-10-CM

## 2019-07-10 DIAGNOSIS — E559 Vitamin D deficiency, unspecified: Secondary | ICD-10-CM

## 2019-07-10 DIAGNOSIS — D649 Anemia, unspecified: Secondary | ICD-10-CM

## 2019-07-10 DIAGNOSIS — K219 Gastro-esophageal reflux disease without esophagitis: Secondary | ICD-10-CM

## 2019-07-10 DIAGNOSIS — M8580 Other specified disorders of bone density and structure, unspecified site: Secondary | ICD-10-CM

## 2019-07-10 DIAGNOSIS — G43709 Chronic migraine without aura, not intractable, without status migrainosus: Secondary | ICD-10-CM

## 2019-07-10 DIAGNOSIS — K589 Irritable bowel syndrome without diarrhea: Secondary | ICD-10-CM

## 2019-07-10 DIAGNOSIS — E2839 Other primary ovarian failure: Secondary | ICD-10-CM

## 2019-07-10 DIAGNOSIS — Z0000 Encounter for general adult medical examination without abnormal findings: Secondary | ICD-10-CM

## 2019-07-10 DIAGNOSIS — I10 Essential (primary) hypertension: Secondary | ICD-10-CM | POA: Diagnosis not present

## 2019-07-10 DIAGNOSIS — E118 Type 2 diabetes mellitus with unspecified complications: Secondary | ICD-10-CM

## 2019-07-10 DIAGNOSIS — Z7185 Encounter for immunization safety counseling: Secondary | ICD-10-CM

## 2019-07-10 DIAGNOSIS — M2550 Pain in unspecified joint: Secondary | ICD-10-CM

## 2019-07-10 DIAGNOSIS — Z8669 Personal history of other diseases of the nervous system and sense organs: Secondary | ICD-10-CM

## 2019-07-10 DIAGNOSIS — K5909 Other constipation: Secondary | ICD-10-CM

## 2019-07-10 DIAGNOSIS — I8393 Asymptomatic varicose veins of bilateral lower extremities: Secondary | ICD-10-CM | POA: Insufficient documentation

## 2019-07-10 DIAGNOSIS — R809 Proteinuria, unspecified: Secondary | ICD-10-CM

## 2019-07-10 DIAGNOSIS — F32A Depression, unspecified: Secondary | ICD-10-CM

## 2019-07-10 DIAGNOSIS — Z7189 Other specified counseling: Secondary | ICD-10-CM

## 2019-07-10 DIAGNOSIS — Z79899 Other long term (current) drug therapy: Secondary | ICD-10-CM

## 2019-07-10 DIAGNOSIS — F419 Anxiety disorder, unspecified: Secondary | ICD-10-CM

## 2019-07-10 DIAGNOSIS — R0989 Other specified symptoms and signs involving the circulatory and respiratory systems: Secondary | ICD-10-CM

## 2019-07-10 DIAGNOSIS — L989 Disorder of the skin and subcutaneous tissue, unspecified: Secondary | ICD-10-CM

## 2019-07-10 DIAGNOSIS — E669 Obesity, unspecified: Secondary | ICD-10-CM

## 2019-07-10 DIAGNOSIS — G4700 Insomnia, unspecified: Secondary | ICD-10-CM

## 2019-07-10 DIAGNOSIS — E785 Hyperlipidemia, unspecified: Secondary | ICD-10-CM

## 2019-07-10 DIAGNOSIS — K635 Polyp of colon: Secondary | ICD-10-CM

## 2019-07-10 DIAGNOSIS — G44029 Chronic cluster headache, not intractable: Secondary | ICD-10-CM

## 2019-07-10 DIAGNOSIS — R609 Edema, unspecified: Secondary | ICD-10-CM

## 2019-07-10 DIAGNOSIS — N289 Disorder of kidney and ureter, unspecified: Secondary | ICD-10-CM

## 2019-07-10 DIAGNOSIS — F329 Major depressive disorder, single episode, unspecified: Secondary | ICD-10-CM

## 2019-07-10 DIAGNOSIS — IMO0002 Reserved for concepts with insufficient information to code with codable children: Secondary | ICD-10-CM

## 2019-07-10 DIAGNOSIS — Z780 Asymptomatic menopausal state: Secondary | ICD-10-CM

## 2019-07-10 NOTE — Progress Notes (Signed)
Subjective:    Emma [name] is a 64 y.o. female who presents for Preventative Services visit and chronic medical problems/med check visit.    Primary Care Provider Tysinger, Camelia Eng, PA-C here for primary care  Current Health Care Team:  Dentist, Dr. Reinaldo Raddle   Eye doctor, n/a   Dr. [place] Cellar, GI  Dr. Deitra Mayo, vascular surgery  Dr. Marcial Pacas, Neurology  Dr. Amil Amen, Rheumatology   Medical Services you may have received from other than [facility] providers in the past year (date may be approximate) rheumatology  Exercise Current exercise habits: The patient does not participate in regular exercise at present.   Nutrition/Diet Current diet: sometimes healthy but most times unhealthy   Depression Screen Depression screen [facility] 2/9 07/10/2019  Decreased Interest 0  Down, Depressed, Hopeless 3  PHQ - 2 Score 3  Altered sleeping 2  Tired, decreased energy 0  Change in appetite 2  Feeling bad or failure about yourself  0  Trouble concentrating 0  Moving slowly or fidgety/restless 0  PHQ-9 Score 7  Difficult doing work/chores Not difficult at all    Activities of Daily Living Screen/Functional Status Survey Is the patient deaf or have difficulty hearing?: No Does the patient have difficulty seeing, even when wearing glasses/contacts?: No Does the patient have difficulty concentrating, remembering, or making decisions?: No Does the patient have difficulty walking or climbing stairs?: No Does the patient have difficulty dressing or bathing?: No Does the patient have difficulty doing errands alone such as visiting a doctor's office or shopping?: No  Can patient draw a clock face showing 3:15 oclock, yes  Fall Risk Screen Fall Risk  07/10/2019 01/23/2018 07/20/2017 06/02/2017  Falls in the past year? 0 0 No No    Gait Assessment: Normal gait observed yes  Advanced directives Does patient have a [place]? Yes Does patient have a  Living Will? Yes  Past Medical History:  Diagnosis Date  . Chronic constipation   . Cluster headache syndrome 04/27/13  . Colon polyp 06/11/2011   Most recent 04/23/08, no polyp--mild diverticulosis.  Repeat 10 yrs.  . Depression with anxiety 02/22/2008   Qualifier: Diagnosis of  By: Nolon Rod CMA (AAMA), Robin    . Diabetes mellitus without complication ([place])   . Diverticulosis   . GERD (gastroesophageal reflux disease) 06/11/2011  . Hypertension    Recently diagnosed  . Migraine syndrome   . Osteopenia 11/2016   bone density. plan repeat in 11/2018  . Overweight(278.02) 06/11/2011  . PUD (peptic ulcer disease) 04/23/08   Multiple antrum ulcers; h pylori neg  . Rheumatoid arthritis(714.0)     Past Surgical History:  Procedure Laterality Date  . ABDOMINAL HYSTERECTOMY    . APPENDECTOMY    . BACK SURGERY     3 times  . CARDIOVASCULAR STRESS TEST  06/22/11   Normal; EF 65%  . CHOLECYSTECTOMY    . COLONOSCOPY  04/23/2008   mild diverticulosis, internal hemorrhoids; Dr. Erskine Emery  . ENDOVENOUS ABLATION SAPHENOUS VEIN W/ LASER Left 06/07/2019   endovenous laser ablation left greater saphenous vein and stab phlebectomy < 10 incisions left leg by Gae Gallop MD  . LIVER BIOPSY  03/2007   Chronic active hepatitis w/fibrosis--related to hx of methotrexate treatment.  Marland Kitchen SKIN BIOPSY  08/16/2017   superficial basal cell carcinoma left inferior medial anterior tibia     Social History   Socioeconomic History  . Marital status: Married    Spouse name: Not  on file  . Number of children: 1  . Years of education: Not on file  . Highest education level: Not on file  Occupational History  . Occupation: retired on disability  Tobacco Use  . Smoking status: Never Smoker  . Smokeless tobacco: Never Used  Substance and Sexual Activity  . Alcohol use: Not Currently  . Drug use: No  . Sexual activity: Not Currently    Partners: Male  Other Topics Concern  . Not on file  Social History  Narrative   Married, caregiver, limited exercise.   01/2018.   Social Determinants of Health   Financial Resource Strain:   . Difficulty of Paying Living Expenses:   Food Insecurity:   . Worried About Charity fundraiser in the Last Year:   . Arboriculturist in the Last Year:   Transportation Needs:   . Film/video editor (Medical):   Marland Kitchen Lack of Transportation (Non-Medical):   Physical Activity:   . Days of Exercise per Week:   . Minutes of Exercise per Session:   Stress:   . Feeling of Stress :   Social Connections:   . Frequency of Communication with Friends and Family:   . Frequency of Social Gatherings with Friends and Family:   . Attends Religious Services:   . Active Member of Clubs or Organizations:   . Attends Archivist Meetings:   Marland Kitchen Marital Status:   Intimate Partner Violence:   . Fear of Current or Ex-Partner:   . Emotionally Abused:   Marland Kitchen Physically Abused:   . Sexually Abused:     Family History  Problem Relation Age of Onset  . Diabetes Mother   . Stroke Mother   . Arthritis Father   . Hypertension Father   . Atrial fibrillation Father   . Alcohol abuse Sister   . Cancer Sister        mesothelioma  . Heart disease Sister        cardiomegaly  . Hypertension Sister   . Pulmonary embolism Sister   . Hypertension Brother   . Polycystic ovary syndrome Daughter   . Heart disease Maternal Grandmother   . Heart disease Maternal Grandfather   . Colon cancer Paternal Uncle   . Esophageal cancer Neg Hx   . Stomach cancer Neg Hx      Current Outpatient Medications:  .  acetaminophen (TYLENOL) 500 MG tablet, Take 1,500 mg by mouth once as needed for mild pain or moderate pain., Disp: , Rfl:  .  ALPRAZolam (XANAX) 0.5 MG tablet, TAKE 1 TABLET BY MOUTH EVERYDAY AT BEDTIME, Disp: 30 tablet, Rfl: 0 .  AMBULATORY NON FORMULARY MEDICATION, Medication Name: *Nitroglycerin 0.125% gel. Apply a pea size amount to your rectum three times daily x 6-8 weeks  (Patient taking differently: Place 1 application rectally See admin instructions. Medication Name: *Nitroglycerin 0.125% gel. Apply a pea size amount to your rectum three times daily x 6-8 weeks), Disp: 30 g, Rfl: 0 .  BIOTIN PO, Take 1 capsule by mouth daily., Disp: , Rfl:  .  busPIRone (BUSPAR) 7.5 MG tablet, TAKE 1 TABLET (7.5 MG TOTAL) BY MOUTH 2 (TWO) TIMES DAILY., Disp: 180 tablet, Rfl: 1 .  calcium carbonate ([employer] CALCIUM) 1500 (600 Ca) MG TABS tablet, TAKE 1 TABLET (600 MG TOTAL) BY MOUTH 2 (TWO) TIMES DAILY WITH A MEAL., Disp: 150 tablet, Rfl: 1 .  carisoprodol (SOMA) 350 MG tablet, TAKE 1 TABLET BY MOUTH TWICE A DAY AND  AT BEDTIME AS NEEDED FOR MUSCLE SPASMS, Disp: , Rfl: 0 .  Cholecalciferol (VITAMIN D3) 250 MCG (10000 UT) TABS, Take 1 tablet by mouth daily., Disp: 90 tablet, Rfl: 3 .  dicyclomine (BENTYL) 10 MG capsule, Take 1-2 capsules (10-20 mg total) by mouth every 8 (eight) hours as needed for spasms., Disp: 120 capsule, Rfl: 1 .  diphenhydrAMINE (BENADRYL) 25 mg capsule, Take 50 mg by mouth once as needed for itching or allergies., Disp: , Rfl:  .  EPINEPHrine 0.3 mg/0.3 mL IJ SOAJ injection, Inject 0.3 mg into the muscle as needed., Disp: , Rfl:  .  Erenumab-aooe (AIMOVIG) 70 MG/ML SOAJ, Inject 70 mg into the skin every 30 (thirty) days., Disp: 1 pen, Rfl: 11 .  etodolac (LODINE) 400 MG tablet, TAKE 1 TABLET BY MOUTH TWICE A DAY WITH FOOD AS NEEDED FOR PAIN, Disp: , Rfl: 1 .  gabapentin (NEURONTIN) 300 MG capsule, TAKE 1 CAPSULE (300 MG TOTAL) BY MOUTH 3 (THREE) TIMES DAILY AS NEEDED., Disp: 270 capsule, Rfl: 0 .  Galcanezumab-gnlm (EMGALITY) 120 MG/ML SOSY, Inject 120 mg into the skin every 30 (thirty) days., Disp: 1 Syringe, Rfl: 8 .  HYDROcodone-acetaminophen (NORCO) 7.5-325 MG per tablet, Take 1 tablet by mouth daily as needed for moderate pain. Reported on 09/05/2015, Disp: , Rfl:  .  hydroxychloroquine (PLAQUENIL) 200 MG tablet, Take 400 mg by mouth daily., Disp: , Rfl: 2 .   hydrOXYzine (ATARAX/VISTARIL) 25 MG tablet, Take 1 tablet (25 mg total) by mouth every 8 (eight) hours as needed for itching., Disp: 30 tablet, Rfl: 0 .  Insulin Pen Needle (B-D ULTRAFINE III SHORT PEN) 31G X 8 MM MISC, USE AS DIRECTED, Disp: 100 each, Rfl: 11 .  lisinopril (ZESTRIL) 20 MG tablet, TAKE 1 TABLET BY MOUTH EVERY DAY, Disp: 90 tablet, Rfl: 0 .  lubiprostone (AMITIZA) 24 MCG capsule, Take 1 capsule (24 mcg total) by mouth 2 (two) times daily with a meal., Disp: 60 capsule, Rfl: 3 .  metFORMIN (GLUCOPHAGE) 500 MG tablet, TAKE 1 TABLET BY MOUTH EVERY DAY WITH BREAKFAST, Disp: 90 tablet, Rfl: 0 .  Multiple Vitamins-Iron (MULTIVITAMINS WITH IRON) TABS tablet, Take 1 tablet by mouth daily., Disp: 90 tablet, Rfl: 0 .  omeprazole (PRILOSEC) 40 MG capsule, TAKE 1 CAPSULE BY MOUTH EVERY DAY, Disp: 90 capsule, Rfl: 0 .  Plecanatide (TRULANCE) 3 MG TABS, Take 3 mg by mouth daily., Disp: 90 tablet, Rfl: 3 .  predniSONE (STERAPRED UNI-PAK 21 TAB) 10 MG (21) TBPK tablet, Take by mouth daily., Disp: 21 tablet, Rfl: 0 .  Prucalopride Succinate (MOTEGRITY) 2 MG TABS, Take 2 mg by mouth daily., Disp: 30 tablet, Rfl: 6 .  rosuvastatin (CRESTOR) 20 MG tablet, TAKE 1 TABLET BY MOUTH EVERYDAY AT BEDTIME, Disp: 90 tablet, Rfl: 0 .  sucralfate (CARAFATE) 1 g tablet, TAKE 1 TABLET BY MOUTH EVERY 6 HOURS AS NEEDED, Disp: 90 tablet, Rfl: 1 .  SUMAtriptan (IMITREX) 100 MG tablet, Take one tablet onset of migraine. May repeat in 2 hours if headache persists or recurs (max of 2 in 24hours), Disp: 12 tablet, Rfl: 11 .  tiZANidine (ZANAFLEX) 4 MG tablet, TAKE 1 TABLET (4 MG TOTAL) BY MOUTH EVERY 6 (SIX) HOURS AS NEEDED FOR MUSCLE SPASMS., Disp: 30 tablet, Rfl: 1 .  triamcinolone cream (KENALOG) 0.5 %, Apply 1 application topically 2 (two) times daily., Disp: 30 g, Rfl: 0 .  venlafaxine (EFFEXOR) 37.5 MG tablet, Take 1 tablet (37.5 mg total) by mouth daily., Disp: 90  tablet, Rfl: 4 .  verapamil (CALAN-SR) 240 MG CR  tablet, Take 1 tablet (240 mg total) by mouth at bedtime., Disp: 90 tablet, Rfl: 4 .  ondansetron (ZOFRAN ODT) 4 MG disintegrating tablet, Take 1 tablet (4 mg total) by mouth every 8 (eight) hours as needed. (Patient not taking: Reported on 06/14/2019), Disp: 20 tablet, Rfl: 6 .  Semaglutide, 1 MG/DOSE, (OZEMPIC, 1 MG/DOSE,) 2 MG/1.5ML SOPN, Inject 1 mg into the skin once a week. (Patient not taking: Reported on 07/10/2019), Disp: 1.5 mL, Rfl: 5  Allergies  Allergen Reactions  . Bee Venom Anaphylaxis    History reviewed: allergies, current medications, past family history, past medical history, past social history, past surgical history and problem list   issues discussed: Her main concern today is dealing with everybody else's stress.  Her husband recently broke his hip and is in the rehab for several weeks to come.  [employer] dropped off a box today and that the package that was supposed to contain $2000 piece of jewelry was not there  Had other stressors going on   Objective:     Biometrics BP (!) 150/86   Pulse 62   Temp 97.9 F (36.6 C)   Ht 5\' 8"  (1.727 m)   Wt 246 lb 12.8 oz (111.9 kg)   SpO2 96%   BMI 37.53 kg/m   BP Readings from Last 3 Encounters:  07/10/19 (!) 150/86  06/14/19 (!) 179/78  06/07/19 98/65   Wt Readings from Last 3 Encounters:  07/10/19 246 lb 12.8 oz (111.9 kg)  06/14/19 240 lb (108.9 kg)  06/07/19 240 lb (108.9 kg)     Cognitive Testing  Alert? Yes  Normal Appearance?Yes  Oriented to person? Yes  Place? Yes   Time? Yes  Recall of three objects?  Yes  Can perform simple calculations? Yes  Displays appropriate judgment?Yes  Can read the correct time from a watch face?Yes  General appearance: alert, no distress, WD/WN, white female  Nutritional Status: Inadequate calore intake? no Loss of muscle mass? no Loss of fat beneath skin? no Localized or general edema? no Diminished functional status? no  Other pertinent exam: Neck: supple, no  lymphadenopathy, no thyromegaly, no masses Heart: RRR, normal S1, S2, no murmurs Lungs: CTA bilaterally, no wheezes, rhonchi, or rales Abdomen: +bs, soft, non tender, non distended, no masses, no hepatomegaly, no splenomegaly Musculoskeletal: nontender, no swelling, no obvious deformity Extremities: no edema, no cyanosis, no clubbing Pulses: 2+ symmetric, upper and lower extremities, normal cap refill Neurological: alert, oriented x 3, CN2-12 intact, strength normal upper extremities and lower extremities, sensation normal throughout, DTRs 2+ throughout, no cerebellar signs, gait normal Psychiatric: normal affect, behavior normal, pleasant    Assessment:   Encounter Diagnoses  Name Primary?  . Medicare annual wellness visit, subsequent Yes  . Essential hypertension   . Chronic venous insufficiency   . Chronic migraine   . Chronic constipation   . Polyp of colon, unspecified part of colon, unspecified type   . Irritable bowel syndrome, unspecified type   . Gastroesophageal reflux disease without esophagitis   . Diabetes mellitus with complication ([place])   . Osteopenia, unspecified location   . Skin lesion   . Chronic cluster headache, not intractable   . Renal insufficiency   . High risk medication use   . Insomnia, unspecified type   . History of migraine headaches   . Obesity, unspecified classification, unspecified obesity type, unspecified whether serious comorbidity present   . Anxiety and  depression   . Polyarthralgia   . Microalbuminuria   . Post-menopausal   . Estrogen deficiency   . Vitamin D deficiency   . Vaccine counseling   . Hyperlipidemia, unspecified hyperlipidemia type   . Anemia, unspecified type   . Decreased pedal pulses   . Edema, unspecified type   . Varicose veins of both lower extremities, unspecified whether complicated      Plan:   A preventative services visit was completed today.  During the course of the visit today, we discussed and  counseled about appropriate screening and preventive services.  A health risk assessment was established today that included a review of current medications, allergies, social history, family history, medical and preventative health history, biometrics, and preventative screenings to identify potential safety concerns or impairments.  A personalized plan was printed today for your records and use.   Personalized health advice and education was given today to reduce health risks and promote self management and wellness.  Information regarding end of life planning was discussed today.  Conditions/risks identified: Constipation, polypharmacy   Chronic problems discussed today: The main issue today was medication.  Her medicine list today shows duplications, overlapping medications.  She was not 100 since sure of all of her medicines.  She will call back from home and read off every single medicine to help [place] reconcile medications and work to avoid any duplications  Constipation-advised we try 1 specific medicine and reduce any duplications, advise she follow-up with gastroenterology  Schedule updated bone density test  Diabetes-routine labs today, continue current medication  Hypertension-continue current medication  Anxiety depression-doing okay on current regimen    Acute problems discussed today: Stress  Recommendations:  I recommend a yearly ophthalmology/optometry visit for glaucoma screening and eye checkup  I recommended a yearly dental visit for hygiene and checkup  Advanced directives - discussed nature and purpose of Advanced Directives, encouraged them to complete them if they have not done so and/or encouraged them to get [place] a copy if they have done this already.    Cancer screen Reviewed 2019 colonoscopy  Reviewed 2020 mammogram  Discussed routine labs, skin surveillance  Referrals today: None  Immunizations: I recommended a yearly influenza vaccine, typically  in September when the vaccine is usually available She is up-to-date on pneumococcal vaccines She is up-to-date on Shingrix vaccine I recommend she check insurance coverage for tetanus shot     Lonie was seen today for medicare wellness.  Diagnoses and all orders for this visit:  Medicare annual wellness visit, subsequent  Essential hypertension -     Comprehensive metabolic panel  Chronic venous insufficiency  Chronic migraine  Chronic constipation -     CBC with Differential/Platelet  Polyp of colon, unspecified part of colon, unspecified type  Irritable bowel syndrome, unspecified type  Gastroesophageal reflux disease without esophagitis  Diabetes mellitus with complication (HCC) -     Comprehensive metabolic panel -     Microalbumin/Creatinine Ratio, Urine -     Hemoglobin A1c  Osteopenia, unspecified location  Skin lesion  Chronic cluster headache, not intractable  Renal insufficiency  High risk medication use  Insomnia, unspecified type  History of migraine headaches  Obesity, unspecified classification, unspecified obesity type, unspecified whether serious comorbidity present  Anxiety and depression  Polyarthralgia  Microalbuminuria  Post-menopausal  Estrogen deficiency -     DG Bone Density; Future  Vitamin D deficiency  Vaccine counseling  Hyperlipidemia, unspecified hyperlipidemia type -     Comprehensive metabolic panel -  Lipid panel  Anemia, unspecified type -     CBC with Differential/Platelet  Decreased pedal pulses  Edema, unspecified type  Varicose veins of both lower extremities, unspecified whether complicated      Medicare Attestation A preventative services visit was completed today.  During the course of the visit the patient was educated and counseled about appropriate screening and preventive services.  A health risk assessment was established with the patient that included a review of current medications,  allergies, social history, family history, medical and preventative health history, biometrics, and preventative screenings to identify potential safety concerns or impairments.  A personalized plan was printed today for the patient's records and use.   Personalized health advice and education was given today to reduce health risks and promote self management and wellness.  Information regarding end of life planning was discussed today.  Dorothea Ogle, PA-C   07/12/2019

## 2019-07-11 LAB — CBC WITH DIFFERENTIAL/PLATELET
Basophils Absolute: 0.1 10*3/uL (ref 0.0–0.2)
Basos: 1 %
EOS (ABSOLUTE): 0.4 10*3/uL (ref 0.0–0.4)
Eos: 6 %
Hematocrit: 34 % (ref 34.0–46.6)
Hemoglobin: 11.2 g/dL (ref 11.1–15.9)
Immature Grans (Abs): 0 10*3/uL (ref 0.0–0.1)
Immature Granulocytes: 0 %
Lymphocytes Absolute: 1.7 10*3/uL (ref 0.7–3.1)
Lymphs: 27 %
MCH: 28.9 pg (ref 26.6–33.0)
MCHC: 32.9 g/dL (ref 31.5–35.7)
MCV: 88 fL (ref 79–97)
Monocytes Absolute: 0.6 10*3/uL (ref 0.1–0.9)
Monocytes: 10 %
Neutrophils Absolute: 3.6 10*3/uL (ref 1.4–7.0)
Neutrophils: 56 %
Platelets: 236 10*3/uL (ref 150–450)
RBC: 3.88 x10E6/uL (ref 3.77–5.28)
RDW: 14 % (ref 11.7–15.4)
WBC: 6.5 10*3/uL (ref 3.4–10.8)

## 2019-07-11 LAB — COMPREHENSIVE METABOLIC PANEL
ALT: 13 IU/L (ref 0–32)
AST: 12 IU/L (ref 0–40)
Albumin/Globulin Ratio: 1.8 (ref 1.2–2.2)
Albumin: 4.4 g/dL (ref 3.8–4.8)
Alkaline Phosphatase: 67 IU/L (ref 39–117)
BUN/Creatinine Ratio: 18 (ref 12–28)
BUN: 24 mg/dL (ref 8–27)
Bilirubin Total: 0.3 mg/dL (ref 0.0–1.2)
CO2: 26 mmol/L (ref 20–29)
Calcium: 9 mg/dL (ref 8.7–10.3)
Chloride: 103 mmol/L (ref 96–106)
Creatinine, Ser: 1.32 mg/dL — ABNORMAL HIGH (ref 0.57–1.00)
GFR calc Af Amer: 49 mL/min/{1.73_m2} — ABNORMAL LOW (ref >59)
GFR calc non Af Amer: 42 mL/min/{1.73_m2} — ABNORMAL LOW (ref >59)
Globulin, Total: 2.4 g/dL (ref 1.5–4.5)
Glucose: 90 mg/dL (ref 65–99)
Potassium: 4.7 mmol/L (ref 3.5–5.2)
Sodium: 142 mmol/L (ref 134–144)
Total Protein: 6.8 g/dL (ref 6.0–8.5)

## 2019-07-11 LAB — LIPID PANEL
Chol/HDL Ratio: 2.4 ratio (ref 0.0–4.4)
Cholesterol, Total: 118 mg/dL (ref 100–199)
HDL: 49 mg/dL (ref >39)
LDL Chol Calc (NIH): 42 mg/dL (ref 0–99)
Triglycerides: 162 mg/dL — ABNORMAL HIGH (ref 0–149)
VLDL Cholesterol Cal: 27 mg/dL (ref 5–40)

## 2019-07-11 LAB — MICROALBUMIN / CREATININE URINE RATIO
Creatinine, Urine: 45.4 mg/dL
Microalb/Creat Ratio: 330 mg/g creat — ABNORMAL HIGH (ref 0–29)
Microalbumin, Urine: 149.8 ug/mL

## 2019-07-11 LAB — HEMOGLOBIN A1C
Est. average glucose Bld gHb Est-mCnc: 140 mg/dL
Hgb A1c MFr Bld: 6.5 % — ABNORMAL HIGH (ref 4.8–5.6)

## 2019-07-19 ENCOUNTER — Telehealth: Payer: Self-pay | Admitting: Medical

## 2019-07-19 NOTE — Telephone Encounter (Signed)
Please call her back, she was supposed to call and reconcile medications with you as there was discrepancy or duplications on her recent visit

## 2019-07-19 NOTE — Telephone Encounter (Signed)
Called patient several times and lmom to call back so we can discuss the medications she is taking.

## 2019-07-19 NOTE — Telephone Encounter (Signed)
Patient stated she doesn't have time because she has a lot on her plate right now. Stated her husband is in the nursing home, etc. Sh will call back when she has a moment to sit down and look at her meds.

## 2019-07-26 ENCOUNTER — Other Ambulatory Visit: Payer: Self-pay | Admitting: Family Medicine

## 2019-07-26 NOTE — Telephone Encounter (Signed)
Is this okay to refill? 

## 2019-07-26 NOTE — Telephone Encounter (Signed)
Your patient 

## 2019-08-09 ENCOUNTER — Other Ambulatory Visit: Payer: Medicare Other | Admitting: Vascular Surgery

## 2019-08-10 ENCOUNTER — Other Ambulatory Visit: Payer: Self-pay | Admitting: Gastroenterology

## 2019-08-15 ENCOUNTER — Other Ambulatory Visit: Payer: Self-pay | Admitting: Gastroenterology

## 2019-08-16 ENCOUNTER — Ambulatory Visit (HOSPITAL_COMMUNITY): Payer: Medicare Other

## 2019-08-16 ENCOUNTER — Ambulatory Visit: Payer: Medicare Other | Admitting: Vascular Surgery

## 2019-08-17 ENCOUNTER — Telehealth: Payer: Self-pay

## 2019-08-17 NOTE — Telephone Encounter (Signed)
Incoming fax from [place]. Patient states that the she cannot afford [facility] and requests an alternative

## 2019-08-17 NOTE — Telephone Encounter (Signed)
This is unfortunate,  I would appeal it to insurance. She has failed linzess and amitiza, and high dose Miralax. I'm not sure if [facility] has patient assistance she can apply for. We could try [employer], I can't recall if we gave her a sample of that already, she may want to touch base with her insurance to see what is covered under her plan. Thanks

## 2019-08-17 NOTE — Telephone Encounter (Signed)
I spoke to the patient. She states that she wants to use the [facility] as it works the best. She states that she has tried for patient assistance for a different medication for her arthritis and was denied. She complains of being to broke with her husband in the nursing home now. I explained that we didn't have any samples at this time and that we should try for the patient assistance program.

## 2019-08-22 NOTE — Telephone Encounter (Signed)
Printed application for patient to apply for [place] Patient Assistance for [facility].  Completed prescriber sections, attached a med list/allergies,  Dr. Havery Moros signed application and mailed to patient.  Called and spoke to pt.  She understands she must complete all patient sections, attached necessary documents and mail to [place].

## 2019-08-22 NOTE — Telephone Encounter (Signed)
Tells me she was advised last week to call back if she had not heard back from office.

## 2019-08-26 ENCOUNTER — Other Ambulatory Visit: Payer: Self-pay | Admitting: Neurology

## 2019-08-27 ENCOUNTER — Other Ambulatory Visit: Payer: Self-pay | Admitting: Neurology

## 2019-08-29 ENCOUNTER — Ambulatory Visit: Payer: Medicare Other | Admitting: Neurology

## 2019-08-29 NOTE — Progress Notes (Deleted)
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    She is also on polypharmacy treatment, including Effexor 37.5 mg daily, gabapentin 300 mg 3 times daily, BuSpar 7.5 mg twice daily  For abortive treatment, she has tried Imitrex, Maxalt with limited help   I personally reviewed MRI of the brain with and without contrast in March 2013, mild small vessel disease there was no acute abnormalities  She now has migraine headache days 15 days in 1 month, lateralized severe pounding headache with associated light, noise sensitivity,  Update August 29, 2019 SS: CRP, sed rate, TSH were normal  REVIEW OF SYSTEMS: Out of a complete 14 system review of symptoms, the patient complains only of the following symptoms, and all other reviewed systems are negative.  ALLERGIES: Allergies  Allergen Reactions  . Bee Venom Anaphylaxis    HOME MEDICATIONS: Outpatient Medications Prior to Visit  Medication Sig Dispense Refill  . acetaminophen (TYLENOL) 500 MG tablet Take 1,500 mg by mouth once as needed for mild pain or moderate pain.    .Marland KitchenALPRAZolam (XANAX) 0.5 MG tablet TAKE 1 TABLET BY MOUTH EVERYDAY AT BEDTIME 30 tablet 0  . AMBULATORY NON FORMULARY MEDICATION Medication Name:  *Nitroglycerin 0.125% gel. Apply a pea size amount to your rectum three times daily x 6-8 weeks (Patient taking differently: Place 1 application rectally See admin instructions. Medication Name: *Nitroglycerin 0.125% gel. Apply a pea size amount to your rectum three times daily x 6-8 weeks) 30 g 0  . BIOTIN PO Take 1 capsule by mouth daily.    . busPIRone (BUSPAR) 7.5 MG tablet TAKE 1 TABLET (7.5 MG TOTAL) BY MOUTH 2 (TWO) TIMES DAILY. 180 tablet 1  . calcium carbonate ([employer] CALCIUM) 1500 (600 Ca) MG TABS tablet TAKE 1 TABLET (600 MG TOTAL) BY MOUTH 2 (TWO) TIMES DAILY WITH A MEAL. 150 tablet 1  . carisoprodol (SOMA) 350 MG tablet TAKE 1 TABLET BY MOUTH TWICE A DAY AND AT BEDTIME AS NEEDED FOR MUSCLE SPASMS  0  . Cholecalciferol (VITAMIN D3) 250 MCG (10000 UT) TABS Take 1 tablet by mouth daily. 90 tablet 3  . dicyclomine (BENTYL) 10 MG capsule Take 1-2 capsules (10-20 mg total) by mouth every 8 (eight) hours as needed for spasms. 120 capsule 1  . diphenhydrAMINE (BENADRYL) 25 mg capsule Take 50 mg by mouth once as needed for itching or allergies.    .Marland KitchenEPINEPHrine 0.3 mg/0.3 mL IJ SOAJ injection Inject 0.3 mg into the muscle as needed.    .Emma [name](AIMOVIG) 70 MG/ML SOAJ Inject 70 mg into the skin every 30 (thirty) days. 1 pen 11  . etodolac (LODINE) 400 MG tablet TAKE 1 TABLET BY MOUTH TWICE A DAY WITH FOOD AS NEEDED FOR PAIN  1  . gabapentin (NEURONTIN) 300  MG capsule TAKE 1 CAPSULE (300 MG TOTAL) BY MOUTH 3 (THREE) TIMES DAILY AS NEEDED. 270 capsule 0  . Galcanezumab-gnlm (EMGALITY) 120 MG/ML SOSY Inject 120 mg into the skin every 30 (thirty) days. 1 Syringe 8  . HYDROcodone-acetaminophen (NORCO) 7.5-325 MG per tablet Take 1 tablet by mouth daily as needed for moderate pain. Reported on 09/05/2015    . hydroxychloroquine (PLAQUENIL) 200 MG tablet Take 400 mg by mouth daily.  2  . hydrOXYzine (ATARAX/VISTARIL) 25 MG tablet Take 1 tablet (25 mg total) by mouth every 8 (eight) hours as needed for  itching. 30 tablet 0  . Insulin Pen Needle (B-D ULTRAFINE III SHORT PEN) 31G X 8 MM MISC USE AS DIRECTED 100 each 11  . lisinopril (ZESTRIL) 20 MG tablet TAKE 1 TABLET BY MOUTH EVERY DAY 90 tablet 0  . lubiprostone (AMITIZA) 24 MCG capsule Take 1 capsule (24 mcg total) by mouth 2 (two) times daily with a meal. 60 capsule 3  . metFORMIN (GLUCOPHAGE) 500 MG tablet TAKE 1 TABLET BY MOUTH EVERY DAY WITH BREAKFAST 90 tablet 0  . Multiple Vitamins-Iron (MULTIVITAMINS WITH IRON) TABS tablet Take 1 tablet by mouth daily. 90 tablet 0  . omeprazole (PRILOSEC) 40 MG capsule TAKE 1 CAPSULE BY MOUTH EVERY DAY 90 capsule 0  . ondansetron (ZOFRAN ODT) 4 MG disintegrating tablet Take 1 tablet (4 mg total) by mouth every 8 (eight) hours as needed. (Patient not taking: Reported on 06/14/2019) 20 tablet 6  . predniSONE (STERAPRED UNI-PAK 21 TAB) 10 MG (21) TBPK tablet Take by mouth daily. 21 tablet 0  . Prucalopride Succinate (MOTEGRITY) 2 MG TABS Take 2 mg by mouth daily. 30 tablet 6  . rosuvastatin (CRESTOR) 20 MG tablet TAKE 1 TABLET BY MOUTH EVERYDAY AT BEDTIME 90 tablet 0  . Semaglutide, 1 MG/DOSE, (OZEMPIC, 1 MG/DOSE,) 2 MG/1.5ML SOPN Inject 1 mg into the skin once a week. (Patient not taking: Reported on 07/10/2019) 1.5 mL 5  . sucralfate (CARAFATE) 1 g tablet TAKE 1 TABLET BY MOUTH EVERY 6 HOURS AS NEEDED 90 tablet 1  . SUMAtriptan (IMITREX) 100 MG tablet Take one tablet onset of migraine. May repeat in 2 hours if headache persists or recurs (max of 2 in 24hours) 12 tablet 11  . tiZANidine (ZANAFLEX) 4 MG tablet TAKE 1 TABLET (4 MG TOTAL) BY MOUTH EVERY 6 (SIX) HOURS AS NEEDED FOR MUSCLE SPASMS. 30 tablet 1  . triamcinolone cream (KENALOG) 0.5 % Apply 1 application topically 2 (two) times daily. 30 g 0  . TRULANCE 3 MG TABS TAKE 1 TABLET BY MOUTH EVERY DAY 30 tablet 11  . venlafaxine (EFFEXOR) 37.5 MG tablet TAKE 1 TABLET BY MOUTH EVERY DAY 90 tablet 0  . verapamil (CALAN-SR) 240 MG CR tablet Take 1 tablet (240  mg total) by mouth at bedtime. 90 tablet 4   No facility-administered medications prior to visit.    PAST MEDICAL HISTORY: Past Medical History:  Diagnosis Date  . Chronic constipation   . Cluster headache syndrome 04/27/13  . Colon polyp 06/11/2011   Most recent 04/23/08, no polyp--mild diverticulosis.  Repeat 10 yrs.  . Depression with anxiety 02/22/2008   Qualifier: Diagnosis of  By: Nolon Rod CMA (AAMA), Robin    . Diabetes mellitus without complication ([place])   . Diverticulosis   . GERD (gastroesophageal reflux disease) 06/11/2011  . Hypertension    Recently diagnosed  . Migraine syndrome   . Osteopenia 11/2016   bone density. plan repeat in 11/2018  .  Overweight(278.02) 06/11/2011  . PUD (peptic ulcer disease) 04/23/08   Multiple antrum ulcers; h pylori neg  . Rheumatoid arthritis(714.0)     PAST SURGICAL HISTORY: Past Surgical History:  Procedure Laterality Date  . ABDOMINAL HYSTERECTOMY    . APPENDECTOMY    . BACK SURGERY     3 times  . CARDIOVASCULAR STRESS TEST  06/22/11   Normal; EF 65%  . CHOLECYSTECTOMY    . COLONOSCOPY  04/23/2008   mild diverticulosis, internal hemorrhoids; Dr. Erskine Emery  . ENDOVENOUS ABLATION SAPHENOUS VEIN W/ LASER Left 06/07/2019   endovenous laser ablation left greater saphenous vein and stab phlebectomy < 10 incisions left leg by Gae Gallop MD  . LIVER BIOPSY  03/2007   Chronic active hepatitis w/fibrosis--related to hx of methotrexate treatment.  Marland Kitchen SKIN BIOPSY  08/16/2017   superficial basal cell carcinoma left inferior medial anterior tibia     FAMILY HISTORY: Family History  Problem Relation Age of Onset  . Diabetes Mother   . Stroke Mother   . Arthritis Father   . Hypertension Father   . Atrial fibrillation Father   . Alcohol abuse Sister   . Cancer Sister        mesothelioma  . Heart disease Sister        cardiomegaly  . Hypertension Sister   . Pulmonary embolism Sister   . Hypertension Brother   . Polycystic ovary  syndrome Daughter   . Heart disease Maternal Grandmother   . Heart disease Maternal Grandfather   . Colon cancer Paternal Uncle   . Esophageal cancer Neg Hx   . Stomach cancer Neg Hx     SOCIAL HISTORY: Social History   Socioeconomic History  . Marital status: Married    Spouse name: Not on file  . Number of children: 1  . Years of education: Not on file  . Highest education level: Not on file  Occupational History  . Occupation: retired on disability  Tobacco Use  . Smoking status: Never Smoker  . Smokeless tobacco: Never Used  Substance and Sexual Activity  . Alcohol use: Not Currently  . Drug use: No  . Sexual activity: Not Currently    Partners: Male  Other Topics Concern  . Not on file  Social History Narrative   Married, caregiver, limited exercise.   01/2018.   Social Determinants of Health   Financial Resource Strain:   . Difficulty of Paying Living Expenses:   Food Insecurity:   . Worried About Charity fundraiser in the Last Year:   . Arboriculturist in the Last Year:   Transportation Needs:   . Film/video editor (Medical):   Marland Kitchen Lack of Transportation (Non-Medical):   Physical Activity:   . Days of Exercise per Week:   . Minutes of Exercise per Session:   Stress:   . Feeling of Stress :   Social Connections:   . Frequency of Communication with Friends and Family:   . Frequency of Social Gatherings with Friends and Family:   . Attends Religious Services:   . Active Member of Clubs or Organizations:   . Attends Archivist Meetings:   Marland Kitchen Marital Status:   Intimate Partner Violence:   . Fear of Current or Ex-Partner:   . Emotionally Abused:   Marland Kitchen Physically Abused:   . Sexually Abused:       PHYSICAL EXAM  There were no vitals filed for this visit. There is no height or weight  on file to calculate BMI.  Generalized: Well developed, in no acute distress   Neurological examination  Mentation: Alert oriented to time, place, history  taking. Follows all commands speech and language fluent Cranial nerve II-XII: Pupils were equal round reactive to light. Extraocular movements were full, visual field were full on confrontational test. Facial sensation and strength were normal. Uvula tongue midline. Head turning and shoulder shrug  were normal and symmetric. Motor: The motor testing reveals 5 over 5 strength of all 4 extremities. Good symmetric motor tone is noted throughout.  Sensory: Sensory testing is intact to soft touch on all 4 extremities. No evidence of extinction is noted.  Coordination: Cerebellar testing reveals good finger-nose-finger and heel-to-shin bilaterally.  Gait and station: Gait is normal. Tandem gait is normal. Romberg is negative. No drift is seen.  Reflexes: Deep tendon reflexes are symmetric and normal bilaterally.   DIAGNOSTIC DATA (LABS, IMAGING, TESTING) - I reviewed patient records, labs, notes, testing and imaging myself where available.  Lab Results  Component Value Date   WBC 6.5 07/10/2019   HGB 11.2 07/10/2019   HCT 34.0 07/10/2019   MCV 88 07/10/2019   PLT 236 07/10/2019      Component Value Date/Time   NA 142 07/10/2019 1332   K 4.7 07/10/2019 1332   CL 103 07/10/2019 1332   CO2 26 07/10/2019 1332   GLUCOSE 90 07/10/2019 1332   GLUCOSE 130 (H) 02/28/2019 1644   BUN 24 07/10/2019 1332   CREATININE 1.32 (H) 07/10/2019 1332   CREATININE 0.95 12/16/2016 1244   CALCIUM 9.0 07/10/2019 1332   PROT 6.8 07/10/2019 1332   ALBUMIN 4.4 07/10/2019 1332   AST 12 07/10/2019 1332   ALT 13 07/10/2019 1332   ALKPHOS 67 07/10/2019 1332   BILITOT 0.3 07/10/2019 1332   GFRNONAA 42 (L) 07/10/2019 1332   GFRAA 49 (L) 07/10/2019 1332   Lab Results  Component Value Date   CHOL 118 07/10/2019   HDL 49 07/10/2019   LDLCALC 42 07/10/2019   TRIG 162 (H) 07/10/2019   CHOLHDL 2.4 07/10/2019   Lab Results  Component Value Date   HGBA1C 6.5 (H) 07/10/2019   Lab Results  Component Value Date    VITAMINB12 431 08/22/2018   Lab Results  Component Value Date   TSH 1.720 08/22/2018      ASSESSMENT AND PLAN 64 y.o. year old female  has a past medical history of Chronic constipation, Cluster headache syndrome (04/27/13), Colon polyp (06/11/2011), Depression with anxiety (02/22/2008), Diabetes mellitus without complication ([place]), Diverticulosis, GERD (gastroesophageal reflux disease) (06/11/2011), Hypertension, Migraine syndrome, Osteopenia (11/2016), Overweight(278.02) (06/11/2011), PUD (peptic ulcer disease) (04/23/08), and Rheumatoid arthritis(714.0). here with:  1.  Chronic migraine headaches -ESR, CRP were normal   I spent 15 minutes with the patient. 50% of this time was spent   Butler Denmark, [place], DNP 08/29/2019, 5:45 AM [facility] [address] [place], [place] [address] [phone]

## 2019-09-01 ENCOUNTER — Other Ambulatory Visit: Payer: Self-pay | Admitting: Medical

## 2019-09-06 ENCOUNTER — Other Ambulatory Visit: Payer: Self-pay | Admitting: Medical

## 2019-09-06 NOTE — Telephone Encounter (Signed)
Is this okay to refill? 

## 2019-09-12 ENCOUNTER — Other Ambulatory Visit: Payer: Self-pay | Admitting: Medical

## 2019-09-12 ENCOUNTER — Other Ambulatory Visit: Payer: Self-pay | Admitting: Neurology

## 2019-09-12 NOTE — Telephone Encounter (Signed)
Patient assistance for Emma [name] was denied. Patient does not qualify.

## 2019-09-16 ENCOUNTER — Other Ambulatory Visit: Payer: Self-pay | Admitting: Neurology

## 2019-09-18 ENCOUNTER — Other Ambulatory Visit: Payer: Self-pay | Admitting: Neurology

## 2019-09-20 ENCOUNTER — Other Ambulatory Visit: Payer: Self-pay | Admitting: Neurology

## 2019-10-11 ENCOUNTER — Telehealth: Payer: Self-pay | Admitting: Medical

## 2019-10-11 NOTE — Telephone Encounter (Signed)
DMV form for pt was placed in your folder. Form has to be completed by MD. Pt wants form mailed back to her when completed.

## 2019-10-12 NOTE — Telephone Encounter (Signed)
Not sure why she wants this.

## 2019-10-15 NOTE — Telephone Encounter (Signed)
Patient stated this is more so for her husband and that the form needs to be signed by the MD. Please have MD to sign form as this was an issue before per patient.

## 2019-10-15 NOTE — Telephone Encounter (Signed)
Lmom for patient to call back for message from her provider.

## 2019-10-15 NOTE — Telephone Encounter (Signed)
I received request for handicap placard.  We certainly want her exercising, and I was under the impression she was walking for exercise or has the ability to walk for exercise.  If so then I could not justify continuing handicap placard.    Is she concerned about her arthritic issues that would hinder her ability to walk further from a parking lot into a store?  If this has more to do with husband's conditions then we certainly can do a card on his behalf given his issues.  I  want to get some clarification as we do have to review these requests for verification before we decide whether this is needed or not.  I spoke to my supervising physician as well about this issue since it was placed on his desk prior to coming to me.    According to the [facility] website this does not require an MD signiature.

## 2019-10-16 ENCOUNTER — Other Ambulatory Visit: Payer: Self-pay | Admitting: *Deleted

## 2019-10-16 DIAGNOSIS — I83813 Varicose veins of bilateral lower extremities with pain: Secondary | ICD-10-CM

## 2019-10-16 NOTE — Progress Notes (Signed)
Opened in error

## 2019-10-17 NOTE — Telephone Encounter (Signed)
Please get me handicap disability parking placard for her husband instead.  It would be more appropriate to complete the card for her husband and not her.

## 2019-10-18 NOTE — Telephone Encounter (Signed)
Placard has been placed in your folder.

## 2019-10-29 ENCOUNTER — Other Ambulatory Visit: Payer: Self-pay | Admitting: Medical

## 2019-10-29 ENCOUNTER — Other Ambulatory Visit: Payer: Self-pay | Admitting: Neurology

## 2019-11-01 ENCOUNTER — Other Ambulatory Visit: Payer: Self-pay | Admitting: Medical

## 2019-11-15 ENCOUNTER — Other Ambulatory Visit: Payer: Medicare Other | Admitting: Vascular Surgery

## 2019-11-20 ENCOUNTER — Other Ambulatory Visit: Payer: Self-pay | Admitting: Neurology

## 2019-11-20 ENCOUNTER — Other Ambulatory Visit: Payer: Self-pay | Admitting: Gastroenterology

## 2019-11-20 NOTE — Telephone Encounter (Signed)
Pt last seen 02-2019.  Ok to refill Buspar, 7.5mg  BID? thanks

## 2019-11-20 NOTE — Telephone Encounter (Signed)
Yes okay to refill. Thank you

## 2019-11-22 ENCOUNTER — Ambulatory Visit: Payer: Medicare Other | Admitting: Vascular Surgery

## 2019-11-22 ENCOUNTER — Ambulatory Visit (HOSPITAL_COMMUNITY): Payer: Medicare Other

## 2019-11-29 ENCOUNTER — Other Ambulatory Visit: Payer: Medicare Other | Admitting: Vascular Surgery

## 2019-12-07 ENCOUNTER — Other Ambulatory Visit: Payer: Self-pay | Admitting: Medical

## 2019-12-07 ENCOUNTER — Telehealth: Payer: Self-pay

## 2019-12-07 MED ORDER — ALPRAZOLAM 0.5 MG PO TABS: 0.5000 mg | ORAL_TABLET | Freq: Every evening | ORAL | 0 refills | Status: DC | PRN

## 2019-12-07 NOTE — Telephone Encounter (Signed)
I am sorry to hear about this. Please express our sympathy   I sent in Xanax.    Audelia Acton

## 2019-12-07 NOTE — Telephone Encounter (Signed)
Patient called and stated her brother passed away due to [place] and she is not taking it well at all. She's been crying and don't sleep and wants to know if something can be called in for her nerves. Please advise.

## 2019-12-10 NOTE — Telephone Encounter (Signed)
Left detail message on machine for patient. 

## 2019-12-13 ENCOUNTER — Ambulatory Visit: Payer: Medicare Other | Admitting: Vascular Surgery

## 2019-12-13 ENCOUNTER — Inpatient Hospital Stay (HOSPITAL_COMMUNITY): Admission: RE | Admit: 2019-12-13 | Payer: Medicare Other | Source: Ambulatory Visit

## 2019-12-19 ENCOUNTER — Other Ambulatory Visit: Payer: Self-pay

## 2019-12-19 DIAGNOSIS — I872 Venous insufficiency (chronic) (peripheral): Secondary | ICD-10-CM

## 2019-12-20 ENCOUNTER — Other Ambulatory Visit: Payer: Self-pay | Admitting: *Deleted

## 2019-12-20 MED ORDER — LORAZEPAM 1 MG PO TABS: ORAL_TABLET | ORAL | 0 refills | Status: DC

## 2019-12-27 ENCOUNTER — Encounter: Payer: Self-pay | Admitting: Vascular Surgery

## 2019-12-27 ENCOUNTER — Other Ambulatory Visit: Payer: Self-pay

## 2019-12-27 ENCOUNTER — Ambulatory Visit (INDEPENDENT_AMBULATORY_CARE_PROVIDER_SITE_OTHER): Payer: Medicare Other | Admitting: Vascular Surgery

## 2019-12-27 VITALS — BP 135/84 | HR 70 | Temp 97.9°F | Resp 16 | Ht 68.0 in | Wt 246.8 lb

## 2019-12-27 DIAGNOSIS — I872 Venous insufficiency (chronic) (peripheral): Secondary | ICD-10-CM

## 2019-12-27 HISTORY — PX: ENDOVENOUS ABLATION SAPHENOUS VEIN W/ LASER: SUR449

## 2019-12-27 NOTE — Progress Notes (Signed)
Patient name: Emma [name] MRN: [id number] DOB: September 27, 1955 Sex: female  REASON FOR VISIT: For laser ablation of the right great saphenous vein.  HPI: Emma [name] is a 64 y.o. female who had presented with swelling of both lower extremities and significant chronic venous insufficiency.  She had failed conservative treatment and was felt to be a candidate for staged laser ablation of the great saphenous veins.  She underwent laser ablation of the left great saphenous vein on 06/07/2019.  She had a follow-up visit on 06/14/2019 and this showed no evidence of DVT with the great saphenous vein on the left closed from 1 cm distal to the saphenofemoral junction to the mid calf.  She presents now for laser ablation of the right great saphenous vein.  Based on my previous exam of the saphenous vein with the SonoSite on the right I felt that we would cannulate the vein in the proximal calf or around the knee.  Current Outpatient Medications  Medication Sig Dispense Refill  . acetaminophen (TYLENOL) 500 MG tablet Take 1,500 mg by mouth once as needed for mild pain or moderate pain.    Marland Kitchen ALPRAZolam (XANAX) 0.5 MG tablet Take 1 tablet (0.5 mg total) by mouth at bedtime as needed for anxiety. 30 tablet 0  . AMBULATORY NON FORMULARY MEDICATION Medication Name: *Nitroglycerin 0.125% gel. Apply a pea size amount to your rectum three times daily x 6-8 weeks (Patient taking differently: Place 1 application rectally See admin instructions. Medication Name: *Nitroglycerin 0.125% gel. Apply a pea size amount to your rectum three times daily x 6-8 weeks) 30 g 0  . BIOTIN PO Take 1 capsule by mouth daily.    . busPIRone (BUSPAR) 7.5 MG tablet TAKE 1 TABLET (7.5 MG TOTAL) BY MOUTH 2 (TWO) TIMES DAILY. 180 tablet 1  . calcium carbonate ([employer]S CALCIUM) 1500 (600 Ca) MG TABS tablet TAKE 1 TABLET (600 MG TOTAL) BY MOUTH 2 (TWO) TIMES DAILY WITH A MEAL. 150 tablet 1  . carisoprodol (SOMA) 350 MG tablet TAKE 1 TABLET BY MOUTH  TWICE A DAY AND AT BEDTIME AS NEEDED FOR MUSCLE SPASMS  0  . Cholecalciferol (VITAMIN D3) 250 MCG (10000 UT) TABS Take 1 tablet by mouth daily. 90 tablet 3  . dicyclomine (BENTYL) 10 MG capsule Take 1-2 capsules (10-20 mg total) by mouth every 8 (eight) hours as needed for spasms. 120 capsule 1  . diphenhydrAMINE (BENADRYL) 25 mg capsule Take 50 mg by mouth once as needed for itching or allergies.    Marland Kitchen EPINEPHrine 0.3 mg/0.3 mL IJ SOAJ injection Inject 0.3 mg into the muscle as needed.    Eduard Roux (AIMOVIG) 70 MG/ML SOAJ Inject 70 mg into the skin every 30 (thirty) days. 1 pen 11  . etodolac (LODINE) 400 MG tablet TAKE 1 TABLET BY MOUTH TWICE A DAY WITH FOOD AS NEEDED FOR PAIN  1  . gabapentin (NEURONTIN) 300 MG capsule TAKE 1 CAPSULE (300 MG TOTAL) BY MOUTH 3 (THREE) TIMES DAILY AS NEEDED. 270 capsule 0  . Galcanezumab-gnlm (EMGALITY) 120 MG/ML SOSY Inject 120 mg into the skin every 30 (thirty) days. 1 Syringe 8  . HYDROcodone-acetaminophen (NORCO) 7.5-325 MG per tablet Take 1 tablet by mouth daily as needed for moderate pain. Reported on 09/05/2015    . hydroxychloroquine (PLAQUENIL) 200 MG tablet Take 400 mg by mouth daily.  2  . hydrOXYzine (ATARAX/VISTARIL) 25 MG tablet Take 1 tablet (25 mg total) by mouth every 8 (eight) hours as needed  for itching. 30 tablet 0  . Insulin Pen Needle (B-D ULTRAFINE III SHORT PEN) 31G X 8 MM MISC USE AS DIRECTED 100 each 11  . lisinopril (ZESTRIL) 20 MG tablet TAKE 1 TABLET BY MOUTH EVERY DAY 90 tablet 0  . LORazepam (ATIVAN) 1 MG tablet Take 1 tablet 30 prior to leaving house on day of procedure and bring second tablet with you to office. 2 tablet 0  . lubiprostone (AMITIZA) 24 MCG capsule Take 1 capsule (24 mcg total) by mouth 2 (two) times daily with a meal. 60 capsule 3  . metFORMIN (GLUCOPHAGE) 500 MG tablet TAKE 1 TABLET BY MOUTH EVERY DAY WITH BREAKFAST 90 tablet 0  . Multiple Vitamins-Iron (MULTIVITAMINS WITH IRON) TABS tablet Take 1 tablet by  mouth daily. 90 tablet 0  . omeprazole (PRILOSEC) 40 MG capsule TAKE 1 CAPSULE BY MOUTH EVERY DAY 90 capsule 0  . ondansetron (ZOFRAN ODT) 4 MG disintegrating tablet Take 1 tablet (4 mg total) by mouth every 8 (eight) hours as needed. (Patient not taking: Reported on 06/14/2019) 20 tablet 6  . predniSONE (STERAPRED UNI-PAK 21 TAB) 10 MG (21) TBPK tablet Take by mouth daily. 21 tablet 0  . Prucalopride Succinate (MOTEGRITY) 2 MG TABS Take 2 mg by mouth daily. 30 tablet 6  . rosuvastatin (CRESTOR) 20 MG tablet TAKE 1 TABLET BY MOUTH EVERYDAY AT BEDTIME 90 tablet 0  . Semaglutide, 1 MG/DOSE, (OZEMPIC, 1 MG/DOSE,) 2 MG/1.5ML SOPN Inject 1 mg into the skin once a week. (Patient not taking: Reported on 07/10/2019) 1.5 mL 5  . sucralfate (CARAFATE) 1 g tablet TAKE 1 TABLET BY MOUTH EVERY 6 HOURS AS NEEDED 90 tablet 1  . SUMAtriptan (IMITREX) 100 MG tablet Take one tablet onset of migraine. May repeat in 2 hours if headache persists or recurs (max of 2 in 24hours) 12 tablet 11  . tiZANidine (ZANAFLEX) 4 MG tablet TAKE 1 TABLET (4 MG TOTAL) BY MOUTH EVERY 6 (SIX) HOURS AS NEEDED FOR MUSCLE SPASMS. 30 tablet 1  . triamcinolone cream (KENALOG) 0.5 % Apply 1 application topically 2 (two) times daily. 30 g 0  . TRULANCE 3 MG TABS TAKE 1 TABLET BY MOUTH EVERY DAY 30 tablet 11  . venlafaxine (EFFEXOR) 37.5 MG tablet TAKE 1 TABLET BY MOUTH EVERY DAY 90 tablet 0  . verapamil (CALAN-SR) 240 MG CR tablet Take 1 tablet (240 mg total) by mouth at bedtime. 90 tablet 4   No current facility-administered medications for this visit.    PHYSICAL EXAM: Vitals:   12/27/19 1047  BP: 135/84  Pulse: 70  Resp: 16  Temp: 97.9 F (36.6 C)  TempSrc: Temporal  SpO2: 98%  Weight: 246 lb 12.8 oz (111.9 kg)  Height: 5\' 8"  (1.727 m)    PROCEDURE: Laser ablation right great saphenous vein  TECHNIQUE: The patient was taken to the exam room and placed supine.  I looked at the left great saphenous vein myself with the SonoSite  and it looks like I could cannulate this in the proximal calf.  The right leg was prepped and draped in usual sterile fashion.  Under ultrasound guidance, after the skin was anesthetized, I cannulated the great saphenous vein in the proximal calf with a micropuncture needle and a micropuncture sheath was introduced over a wire.  I then advanced the J-wire to just below the saphenofemoral junction.  The 45 cm sheath was advanced over the wire.  The wire and dilator were removed.  I then advanced the laser  fiber through the sheath and then retracted the sheath to expose the tip of the laser fiber.  This was positioned 2.5 cm distal to the saphenofemoral junction.  Next tumescent anesthesia was administered circumferentially around the vein.  Next laser precautions were taken and laser ablation was performed of the right great saphenous vein from 2.5 cm distal to the saphenofemoral junction to the proximal calf.  1641 J of energy were used.  A pressure dressing was applied.  The patient tolerated the procedure well.  She will return next week for a follow-up duplex.  Deitra Mayo [facility] [place] [phone]

## 2019-12-27 NOTE — Progress Notes (Signed)
     Laser Ablation Procedure    Date: 12/27/2019   Emma [name] DOB:1955/04/09  Consent signed: Yes      Surgeon: Gae Gallop  MD  Procedure: Laser Ablation: right Greater Saphenous Vein  BP 135/84 (BP Location: Left Arm, Patient Position: Sitting, Cuff Size: Normal)   Pulse 70   Temp 97.9 F (36.6 C) (Temporal)   Resp 16   Ht 5\' 8"  (1.727 m)   Wt 246 lb 12.8 oz (111.9 kg)   SpO2 98%   BMI 37.53 kg/m   Tumescent Anesthesia: 375 cc 0.9% NaCl with 50 cc Lidocaine HCL 1%  and 15 cc 8.4% NaHCO3  Local Anesthesia: 4 cc Lidocaine HCL and NaHCO3 (ratio 2:1)  7 watts continuous mode     Total energy: 1641 joules    Total time: 234 sconds Treatment Length 39 cm Laser Fiber Ref. # [id number]                              Lot # [id number]      Patient tolerated procedure well  Notes: Patient wore face mask.  All staff members wore facial masks and facial shields/goggles.  Patient took 1 mg Ativan at 9:00 am and Ativan 1 mg at 10:15 am.  Description of Procedure:  After marking the course of the secondary varicosities, the patient was placed on the operating table in the supine position, and the right leg was prepped and draped in sterile fashion.   Local anesthetic was administered and under ultrasound guidance the saphenous vein was accessed with a micro needle and guide wire; then the mirco puncture sheath was placed.  A guide wire was inserted saphenofemoral junction , followed by a 5 french sheath.  The position of the sheath and then the laser fiber below the junction was confirmed using the ultrasound.  Tumescent anesthesia was administered along the course of the saphenous vein using ultrasound guidance. The patient was placed in Trendelenburg position and protective laser glasses were placed on patient and staff, and the laser was fired at 7 watts continuous mode for a total of 1641  joules.         Steri strip was applied to the IV insertion site and ABD pads and  thigh high compression stockings were applied.  Ace wrap were applied  to the right thigh and saphenofemoral junction.       Blood loss was less than 15 cc.  Discharge instructions reviewed with patient and hardcopy of discharge instructions given to patient to take home. The patient ambulated out of the operating room having tolerated the procedure well.

## 2020-01-03 ENCOUNTER — Ambulatory Visit (HOSPITAL_COMMUNITY)
Admission: RE | Admit: 2020-01-03 | Discharge: 2020-01-03 | Disposition: A | Discharge status: Home / Self Care | Payer: Medicare Other | Source: Ambulatory Visit | Attending: Vascular Surgery | Admitting: Vascular Surgery

## 2020-01-03 ENCOUNTER — Other Ambulatory Visit: Payer: Self-pay

## 2020-01-03 ENCOUNTER — Encounter: Payer: Self-pay | Admitting: Vascular Surgery

## 2020-01-03 ENCOUNTER — Ambulatory Visit (INDEPENDENT_AMBULATORY_CARE_PROVIDER_SITE_OTHER): Payer: Medicare Other | Admitting: Vascular Surgery

## 2020-01-03 VITALS — BP 175/95 | HR 74 | Temp 98.1°F | Resp 16

## 2020-01-03 DIAGNOSIS — I872 Venous insufficiency (chronic) (peripheral): Secondary | ICD-10-CM

## 2020-01-03 DIAGNOSIS — I83813 Varicose veins of bilateral lower extremities with pain: Secondary | ICD-10-CM

## 2020-01-03 DIAGNOSIS — Z48812 Encounter for surgical aftercare following surgery on the circulatory system: Secondary | ICD-10-CM

## 2020-01-03 NOTE — Progress Notes (Signed)
Patient name: [name] MRN: [id number] DOB: [birth year] Sex: female  REASON FOR VISIT: Follow-up after laser ablation of the right great saphenous vein.  HPI: Emma [name] is a 64 y.o. female who presented with swelling of both lower extremities and had significant chronic venous insufficiency.  She has failed conservative treatment and was felt to be a candidate for staged laser ablation of both great saphenous veins.  She underwent laser ablation of the left great saphenous vein on 06/07/2019 and then subsequently most recently had laser ablation of the right great saphenous vein on 12/27/2019.  The vein was ablated from the proximal calf to within 2-1/2 cm of the saphenofemoral junction.  She comes in for a follow-up visit.  She is 1 week postop.  The patient is doing well and has no specific complaints.  She had mild bruising in the medial thigh which has essentially resolved at this point.  She has been wearing her thigh-high compression stocking during the day.  She denies chest pain or shortness of breath.  Current Outpatient Medications  Medication Sig Dispense Refill  . acetaminophen (TYLENOL) 500 MG tablet Take 1,500 mg by mouth once as needed for mild pain or moderate pain.    Marland Kitchen ALPRAZolam (XANAX) 0.5 MG tablet Take 1 tablet (0.5 mg total) by mouth at bedtime as needed for anxiety. 30 tablet 0  . AMBULATORY NON FORMULARY MEDICATION Medication Name: *Nitroglycerin 0.125% gel. Apply a pea size amount to your rectum three times daily x 6-8 weeks (Patient taking differently: Place 1 application rectally See admin instructions. Medication Name: *Nitroglycerin 0.125% gel. Apply a pea size amount to your rectum three times daily x 6-8 weeks) 30 g 0  . BIOTIN PO Take 1 capsule by mouth daily.    . busPIRone (BUSPAR) 7.5 MG tablet TAKE 1 TABLET (7.5 MG TOTAL) BY MOUTH 2 (TWO) TIMES DAILY. 180 tablet 1  . calcium carbonate ([employer] CALCIUM) 1500 (600 Ca) MG TABS tablet TAKE 1 TABLET (600 MG  TOTAL) BY MOUTH 2 (TWO) TIMES DAILY WITH A MEAL. 150 tablet 1  . carisoprodol (SOMA) 350 MG tablet TAKE 1 TABLET BY MOUTH TWICE A DAY AND AT BEDTIME AS NEEDED FOR MUSCLE SPASMS  0  . Cholecalciferol (VITAMIN D3) 250 MCG (10000 UT) TABS Take 1 tablet by mouth daily. 90 tablet 3  . dicyclomine (BENTYL) 10 MG capsule Take 1-2 capsules (10-20 mg total) by mouth every 8 (eight) hours as needed for spasms. 120 capsule 1  . diphenhydrAMINE (BENADRYL) 25 mg capsule Take 50 mg by mouth once as needed for itching or allergies.    Marland Kitchen EPINEPHrine 0.3 mg/0.3 mL IJ SOAJ injection Inject 0.3 mg into the muscle as needed.    Eduard Roux (AIMOVIG) 70 MG/ML SOAJ Inject 70 mg into the skin every 30 (thirty) days. 1 pen 11  . etodolac (LODINE) 400 MG tablet TAKE 1 TABLET BY MOUTH TWICE A DAY WITH FOOD AS NEEDED FOR PAIN  1  . gabapentin (NEURONTIN) 300 MG capsule TAKE 1 CAPSULE (300 MG TOTAL) BY MOUTH 3 (THREE) TIMES DAILY AS NEEDED. 270 capsule 0  . Galcanezumab-gnlm (EMGALITY) 120 MG/ML SOSY Inject 120 mg into the skin every 30 (thirty) days. 1 Syringe 8  . HYDROcodone-acetaminophen (NORCO) 7.5-325 MG per tablet Take 1 tablet by mouth daily as needed for moderate pain. Reported on 09/05/2015    . hydroxychloroquine (PLAQUENIL) 200 MG tablet Take 400 mg by mouth daily.  2  . hydrOXYzine (ATARAX/VISTARIL) 25 MG  tablet Take 1 tablet (25 mg total) by mouth every 8 (eight) hours as needed for itching. 30 tablet 0  . Insulin Pen Needle (B-D ULTRAFINE III SHORT PEN) 31G X 8 MM MISC USE AS DIRECTED 100 each 11  . lisinopril (ZESTRIL) 20 MG tablet TAKE 1 TABLET BY MOUTH EVERY DAY 90 tablet 0  . LORazepam (ATIVAN) 1 MG tablet Take 1 tablet 30 prior to leaving house on day of procedure and bring second tablet with you to office. 2 tablet 0  . lubiprostone (AMITIZA) 24 MCG capsule Take 1 capsule (24 mcg total) by mouth 2 (two) times daily with a meal. 60 capsule 3  . metFORMIN (GLUCOPHAGE) 500 MG tablet TAKE 1 TABLET BY MOUTH  EVERY DAY WITH BREAKFAST 90 tablet 0  . Multiple Vitamins-Iron (MULTIVITAMINS WITH IRON) TABS tablet Take 1 tablet by mouth daily. 90 tablet 0  . omeprazole (PRILOSEC) 40 MG capsule TAKE 1 CAPSULE BY MOUTH EVERY DAY 90 capsule 0  . ondansetron (ZOFRAN ODT) 4 MG disintegrating tablet Take 1 tablet (4 mg total) by mouth every 8 (eight) hours as needed. (Patient not taking: Reported on 06/14/2019) 20 tablet 6  . predniSONE (STERAPRED UNI-PAK 21 TAB) 10 MG (21) TBPK tablet Take by mouth daily. 21 tablet 0  . Prucalopride Succinate (MOTEGRITY) 2 MG TABS Take 2 mg by mouth daily. 30 tablet 6  . rosuvastatin (CRESTOR) 20 MG tablet TAKE 1 TABLET BY MOUTH EVERYDAY AT BEDTIME 90 tablet 0  . Semaglutide, 1 MG/DOSE, (OZEMPIC, 1 MG/DOSE,) 2 MG/1.5ML SOPN Inject 1 mg into the skin once a week. (Patient not taking: Reported on 07/10/2019) 1.5 mL 5  . sucralfate (CARAFATE) 1 g tablet TAKE 1 TABLET BY MOUTH EVERY 6 HOURS AS NEEDED 90 tablet 1  . SUMAtriptan (IMITREX) 100 MG tablet Take one tablet onset of migraine. May repeat in 2 hours if headache persists or recurs (max of 2 in 24hours) 12 tablet 11  . tiZANidine (ZANAFLEX) 4 MG tablet TAKE 1 TABLET (4 MG TOTAL) BY MOUTH EVERY 6 (SIX) HOURS AS NEEDED FOR MUSCLE SPASMS. 30 tablet 1  . triamcinolone cream (KENALOG) 0.5 % Apply 1 application topically 2 (two) times daily. 30 g 0  . TRULANCE 3 MG TABS TAKE 1 TABLET BY MOUTH EVERY DAY 30 tablet 11  . venlafaxine (EFFEXOR) 37.5 MG tablet TAKE 1 TABLET BY MOUTH EVERY DAY 90 tablet 0  . verapamil (CALAN-SR) 240 MG CR tablet Take 1 tablet (240 mg total) by mouth at bedtime. 90 tablet 4   No current facility-administered medications for this visit.   REVIEW OF SYSTEMS: Valu.Nieves ] denotes positive finding; [  ] denotes negative finding  CARDIOVASCULAR:  [ ]  chest pain   [ ]  dyspnea on exertion  [ ]  leg swelling  CONSTITUTIONAL:  [ ]  fever   [ ]  chills  PHYSICAL EXAM: Vitals:   01/03/20 1020  BP: (!) 175/95  Pulse: 74    Resp: 16  Temp: 98.1 F (36.7 C)  TempSrc: Temporal  SpO2: 96%   GENERAL: The patient is a well-nourished female, in no acute distress. The vital signs are documented above. CARDIOVASCULAR: There is a regular rate and rhythm. PULMONARY: There is good air exchange bilaterally without wheezing or rales. VASCULAR: The patient has mild bruising in the medial thigh.  Both feet are warm and well-perfused.  She has hyperpigmentation bilaterally consistent with CEAP C4 venous disease.  DATA:  VENOUS DUPLEX: I have independently interpreted her venous duplex scan today.  There is no evidence of DVT in the right lower extremity.  The right great saphenous vein is successfully closed from the knee to 1.4 cm from the saphenofemoral junction.  MEDICAL ISSUES:  S/P LASER ABLATION BILATERAL GREAT SAPHENOUS VEINS: The patient is doing well now status post laser ablation of the left great saphenous vein and then subsequently the right great saphenous vein.  She will continue to wear her thigh-high compression stocking for another week.  I will see her back in 6 months just for routine visit without any studies just to see how her venous symptoms are given her fairly advanced venous disease.  Emma [name] [facility] [phone]

## 2020-01-25 ENCOUNTER — Ambulatory Visit: Payer: Medicare Other | Admitting: Medical

## 2020-01-28 ENCOUNTER — Ambulatory Visit (INDEPENDENT_AMBULATORY_CARE_PROVIDER_SITE_OTHER): Payer: Medicare Other | Admitting: Medical

## 2020-01-28 ENCOUNTER — Telehealth: Payer: Self-pay | Admitting: Medical

## 2020-01-28 ENCOUNTER — Other Ambulatory Visit: Payer: Self-pay

## 2020-01-28 ENCOUNTER — Encounter: Payer: Self-pay | Admitting: Medical

## 2020-01-28 VITALS — BP 160/94 | HR 68 | Ht 68.0 in | Wt 240.0 lb

## 2020-01-28 DIAGNOSIS — F32A Depression, unspecified: Secondary | ICD-10-CM | POA: Diagnosis not present

## 2020-01-28 DIAGNOSIS — R809 Proteinuria, unspecified: Secondary | ICD-10-CM

## 2020-01-28 DIAGNOSIS — F4321 Adjustment disorder with depressed mood: Secondary | ICD-10-CM | POA: Diagnosis not present

## 2020-01-28 DIAGNOSIS — I10 Essential (primary) hypertension: Secondary | ICD-10-CM

## 2020-01-28 DIAGNOSIS — G4700 Insomnia, unspecified: Secondary | ICD-10-CM | POA: Diagnosis not present

## 2020-01-28 DIAGNOSIS — Z79899 Other long term (current) drug therapy: Secondary | ICD-10-CM

## 2020-01-28 DIAGNOSIS — G44029 Chronic cluster headache, not intractable: Secondary | ICD-10-CM

## 2020-01-28 DIAGNOSIS — E118 Type 2 diabetes mellitus with unspecified complications: Secondary | ICD-10-CM

## 2020-01-28 DIAGNOSIS — F419 Anxiety disorder, unspecified: Secondary | ICD-10-CM | POA: Diagnosis not present

## 2020-01-28 DIAGNOSIS — K589 Irritable bowel syndrome without diarrhea: Secondary | ICD-10-CM

## 2020-01-28 DIAGNOSIS — N1832 Chronic kidney disease, stage 3b: Secondary | ICD-10-CM | POA: Insufficient documentation

## 2020-01-28 MED ORDER — VERAPAMIL HCL ER 240 MG PO TBCR
240.0000 mg | EXTENDED_RELEASE_TABLET | Freq: Every day | ORAL | 1 refills | Status: DC
Start: 2020-01-28 — End: 2020-02-21

## 2020-01-28 NOTE — Telephone Encounter (Signed)
Please call her pharmacy.   We need to get updated "active" med list from them.  She has multiple specialists .Marland Kitchen  She has been depressed due to numenous deaths in the family, and she even states she hasn't picked up refills recently.  I want pharmacy help to see what she is actually taking.   I refilled Verapamil today as she was out.  although this was prescribed prior by neurology for headaches, it does's  Have an impact on blood pressure.  Since she has been out of this, this may be partially related to BP not controlled.  Get back on this.  Continue Lisinopril .  If BP not improving in the next 2 weeks, I can add Bisoprolol HCT to help.  Lets recheck BP in 2 weeks  Before I do anything different with sleep, lets get updated list from pharmacy to clarify chart record.

## 2020-01-28 NOTE — Progress Notes (Signed)
Subjective:  Emma [name] is a 64 y.o. female who presents for Chief Complaint  Patient presents with  . Hypertension    for about 2 weeks   . Insomnia    had a lot of trauma in family this year      Here for concerns about BP, sleep.  She has had a difficult year.  Her father passed away in 05-26-2019.   Her brother who was very dear to her and who was helping her handle her father's estate passed away of covid age 14yo unexpectedly. This was devastating to Cable.   She has to handle his estate as well as her father's papers.  She is under a lot of  Stress dealing with 3 different attorneys.  Her husband who is elderly had been declining in health the last few years, and he was ultimately place in nursing facility in April of this year.   Another big blow to her.     Her last aunt died of covid 3 weeks ago.   All the men in her life are now gone.  She has been crying a whole lot, grieving, upset.   Over stressed having to also handle estates.   On top of all this, it was very taxing to have to get together papers and documents just to get husband placed in nursing facility.   She has a daughter who lives here in [place], [place].  Her daughter had a miscarriage father's day.  She lives in [place].   Sees her once a monthly.  They do talk on the phone daily.  She works in [place], has long commute.  She has new marriage  Here today as BPs have been running high, which she attributes to stress.  Compliant with Lisinopril 20mg  daily, Verapamil 240mg  daily.    Also having trouble sleeping despite medicaiton she has on file.    Diabetes - not compliant with ozempic weekly, 1mg  dose, metformin 500mg  daily  She hasn't been taking her headache medication Emgality nor Ozpemic due to costs.   With her mood down, stress, she hasn't even picked up refills from pharmacy recently.  She is not sure if she is taking Effexor 37.5mg  or now.   She wasn't real clear on whether she is taking  buspar either.    Can't sleep.  Wants something to help with this.  No other aggravating or relieving factors.    No other c/o.  The following portions of the patient's history were reviewed and updated as appropriate: allergies, current medications, past family history, past medical history, past social history, past surgical history and problem list.  ROS Otherwise as in subjective above  Objective: BP (!) 160/94   Pulse 68   Ht 5\' 8"  (1.727 m)   Wt 240 lb (108.9 kg)   SpO2 96%   BMI 36.49 kg/m   BP Readings from Last 3 Encounters:  01/28/20 (!) 160/94  01/03/20 (!) 175/95  12/27/19 135/84   Wt Readings from Last 3 Encounters:  01/28/20 240 lb (108.9 kg)  12/27/19 246 lb 12.8 oz (111.9 kg)  07/10/19 246 lb 12.8 oz (111.9 kg)    General appearance: alert, no distress, well developed, well nourished Crying, upset, heartbroken   Assessment: Encounter Diagnoses  Name Primary?  . Grieving Yes  . Anxiety and depression   . Insomnia, unspecified type   . Essential hypertension   . Stage 3b chronic kidney disease ([place])   . Microalbuminuria   .  Diabetes mellitus with complication ([place])   . High risk medication use   . Irritable bowel syndrome, unspecified type   . Chronic cluster headache, not intractable      Plan: I expressed empathy for her situation as well as sympathies for her loss.  She is going through a terrible time of grieving and dealing with lots of stressors.  Encouraged counseling but she declines this.   She is not completely sure she is taking her medications as listed today.     We will contact pharmacy to try and clarify active medicaiton she is getting refilled.  She quit taking Emgality and Ozempic due to costs but didn't let us know.     She can follow up with neurology regarding headaches.  I did refill Verapamil today for BP and headache prevention  Hypertension, microalbuminuria, CKD - lab today   Continue Lisinopril.   She has been out of  Verapamil which was originally prescribed for headaches, but being out of this likely contributing to elevated blood pressures.  If not improving in the next few weeks, I will add Bisoprolol HCT low dose.    Diabetes - last HgbA1C 06/2019 of 6.5%.  Update lab today.  Insomnia, anxiety, depression - we will try to verify if she is taking Effexor given pharmacy input.  She notes that she labels her medications Day and Night but not exactly sure what all she is taking currently  She did endorse using a friend xanax recently as she ran out.  advised not to be using other peoples medications, and particularly we have to be extremely careful with controlled substances.   We also have to be careful with too many specialists and sometimes no unified plan.   She had recent refill on Buspar per gastroenterology per chart notes although no recent visit with GI or telephone message in chart.  Not sure if this was an auto renewal   She was not even clear this had been prescribed before.     [name] was seen today for hypertension and insomnia.  Diagnoses and all orders for this visit:  Grieving  Anxiety and depression  Insomnia, unspecified type  Essential hypertension -     Basic metabolic panel  Stage 3b chronic kidney disease ([place]) -     Basic metabolic panel  Microalbuminuria -     Basic metabolic panel  Diabetes mellitus with complication ([facility]) -     Hemoglobin A1c  High risk medication use  Irritable bowel syndrome, unspecified type  Chronic cluster headache, not intractable  Other orders -     verapamil (CALAN-SR) 240 MG CR tablet; Take 1 tablet (240 mg total) by mouth at bedtime.  Spent > 45 minutes face to face with patient in discussion of symptoms, evaluation, plan and recommendations.    Follow up: pending labs, follow up 47mo otherwise

## 2020-01-29 LAB — BASIC METABOLIC PANEL
BUN/Creatinine Ratio: 11 — ABNORMAL LOW (ref 12–28)
BUN: 11 mg/dL (ref 8–27)
CO2: 22 mmol/L (ref 20–29)
Calcium: 8.9 mg/dL (ref 8.7–10.3)
Chloride: 105 mmol/L (ref 96–106)
Creatinine, Ser: 1.03 mg/dL — ABNORMAL HIGH (ref 0.57–1.00)
GFR calc Af Amer: 65 mL/min/{1.73_m2} (ref >59)
GFR calc non Af Amer: 57 mL/min/{1.73_m2} — ABNORMAL LOW (ref >59)
Glucose: 131 mg/dL — ABNORMAL HIGH (ref 65–99)
Potassium: 4.3 mmol/L (ref 3.5–5.2)
Sodium: 141 mmol/L (ref 134–144)

## 2020-01-29 LAB — HEMOGLOBIN A1C
Est. average glucose Bld gHb Est-mCnc: 146 mg/dL
Hgb A1c MFr Bld: 6.7 % — ABNORMAL HIGH (ref 4.8–5.6)

## 2020-01-30 NOTE — Telephone Encounter (Signed)
Pharmacy is faxing over current medications patient has been picking up.

## 2020-01-30 NOTE — Telephone Encounter (Signed)
Lmom for patient to call and schedule an appointment for recheck on blood pressure in 2 weeks.

## 2020-02-01 NOTE — Telephone Encounter (Signed)
I reviewed the list from the pharmacy  From my last note, has she actually been taking the verapamil or had she ran out?  If she is already taking this as of her visit the other day when I would add a medicine called Bisoprolol HCT for help with BP.   However if she has been out of this then just restart this for the next step  I do recommend she continue her medicines as usual  I still think counseling would be helpful.  I have a counselor that I like listed below   Dr. George Hugh or [facility] [phone] [place], [place] [address]   Recheck in 3 weeks in general

## 2020-02-02 ENCOUNTER — Other Ambulatory Visit: Payer: Self-pay | Admitting: Family Medicine

## 2020-02-02 ENCOUNTER — Other Ambulatory Visit: Payer: Self-pay | Admitting: Medical

## 2020-02-04 NOTE — Telephone Encounter (Signed)
[facility] requesting to fill pt xanax. Please advise [facility]

## 2020-02-05 NOTE — Telephone Encounter (Signed)
Letter has been sent to patient with counseling information.

## 2020-02-21 ENCOUNTER — Other Ambulatory Visit: Payer: Self-pay | Admitting: Medical

## 2020-03-03 ENCOUNTER — Other Ambulatory Visit: Payer: Self-pay | Admitting: Medical

## 2020-03-22 HISTORY — PX: OTHER SURGICAL HISTORY: SHX169

## 2020-05-20 ENCOUNTER — Other Ambulatory Visit: Payer: Self-pay | Admitting: Gastroenterology

## 2020-06-01 ENCOUNTER — Other Ambulatory Visit: Payer: Self-pay | Admitting: Neurology

## 2020-06-10 ENCOUNTER — Other Ambulatory Visit: Payer: Self-pay | Admitting: Family Medicine

## 2020-06-10 ENCOUNTER — Other Ambulatory Visit: Payer: Self-pay | Admitting: Medical

## 2020-06-10 ENCOUNTER — Other Ambulatory Visit: Payer: Self-pay | Admitting: Neurology

## 2020-06-10 NOTE — Telephone Encounter (Signed)
[facility] to fill pt gabapentin. Please advise [facility]

## 2020-06-10 NOTE — Telephone Encounter (Signed)
Looks like your patient 

## 2020-06-12 ENCOUNTER — Other Ambulatory Visit: Payer: Self-pay | Admitting: Gastroenterology

## 2020-06-23 ENCOUNTER — Encounter: Payer: Self-pay | Admitting: Medical

## 2020-06-23 ENCOUNTER — Other Ambulatory Visit: Payer: Self-pay | Admitting: Medical

## 2020-06-23 ENCOUNTER — Ambulatory Visit (INDEPENDENT_AMBULATORY_CARE_PROVIDER_SITE_OTHER): Payer: Medicare Other | Admitting: Medical

## 2020-06-23 ENCOUNTER — Other Ambulatory Visit: Payer: Self-pay

## 2020-06-23 VITALS — BP 148/72 | HR 81 | Ht 68.0 in | Wt 233.6 lb

## 2020-06-23 DIAGNOSIS — H1010 Acute atopic conjunctivitis, unspecified eye: Secondary | ICD-10-CM | POA: Diagnosis not present

## 2020-06-23 DIAGNOSIS — H00026 Hordeolum internum left eye, unspecified eyelid: Secondary | ICD-10-CM | POA: Diagnosis not present

## 2020-06-23 DIAGNOSIS — J301 Allergic rhinitis due to pollen: Secondary | ICD-10-CM | POA: Insufficient documentation

## 2020-06-23 DIAGNOSIS — R21 Rash and other nonspecific skin eruption: Secondary | ICD-10-CM | POA: Diagnosis not present

## 2020-06-23 MED ORDER — HYDROCORTISONE 0.5 % EX OINT: 1.0000 "application " | TOPICAL_OINTMENT | Freq: Two times a day (BID) | CUTANEOUS | 0 refills | Status: DC

## 2020-06-23 MED ORDER — CETIRIZINE HCL 10 MG PO TABS: 10.0000 mg | ORAL_TABLET | Freq: Every day | ORAL | 2 refills | Status: DC

## 2020-06-23 MED ORDER — ERYTHROMYCIN 5 MG/GM OP OINT: 1.0000 "application " | TOPICAL_OINTMENT | Freq: Every day | OPHTHALMIC | 0 refills | Status: DC

## 2020-06-23 MED ORDER — NYSTATIN 100000 UNIT/GM EX POWD: 1.0000 "application " | Freq: Three times a day (TID) | CUTANEOUS | 1 refills | Status: DC

## 2020-06-23 MED ORDER — OLOPATADINE HCL 0.2 % OP SOLN: 1.0000 [drp] | Freq: Every day | OPHTHALMIC | 0 refills | Status: DC

## 2020-06-23 NOTE — Progress Notes (Signed)
Subjective: Chief Complaint  Patient presents with  . Blister    Pt present for blisters under breast, on face and hands x2 weeks    Here for rash.  Started on hands, chest, neck, and worse under right breast currently.  Tried some OTC cream, has also kept it dry and used some antiseptic first aid spray.  Has redness under right breast, small bumps right hand, neck , upper chest, somewhat itchy.    Had some recent dental work.  Was on prednisone and antibiotics recently, followed by diflucan for yeast.    Also has some bumps on left lower eyelid, aggravating.    Having runny nose and sneezing, taking generic morning allergy tablet but its not helping  No other aggravating or relieving factors. No other complaint.   Objective: BP (!) 148/72   Pulse 81   Ht 5\' 8"  (1.727 m)   Wt 233 lb 9.6 oz (106 kg)   SpO2 96%   BMI 35.52 kg/m   Gen: wd, wn ,nad Left lower eyelid with several inflamed meibomian glans, bilat conjunctival erythema Small pinkish 2-3 mm roundish flat lesions on dorsal right hand, several similar small lesions on anterior neck, under right breast is 2 separate 3 cm diameter erythematous lesions, inflamed appearing, exam chaperoned by nurse    Assessment: Encounter Diagnoses  Name Primary?  . Rash Yes  . Allergic rhinitis due to pollen, unspecified seasonality   . Allergic conjunctivitis, unspecified laterality   . Meibomianitis of left eye      Plan: Discussed concerns, symptoms, and recommendations as below  Patient Instructions  Allergies:  Begin Cetrizine daily tablet at bedtime for allergies.  Use the cetirizine INSTAD of benadryl, diphenhydramine or Hydroxyzine also listed in the chart.    You can continue the day time allergy pill if it seems to help   If not improving, then stop the daytime pill  Begin Pataday allergy eye drops for the eye drainage or itching  You have the beginning of a stye   Begin Romycin/Erythromycin ointment in the eye  at bedtime daily for a week   Rash  Begin Nystatin powders to the rash on breast/chest  For other rash on hands, face, etc. You can try the Hydrocortisone ointment sent to pharmacy   If not improved or worse over the next 7-10 days then let me know       [name] was seen today for blister.  Diagnoses and all orders for this visit:  Rash  Allergic rhinitis due to pollen, unspecified seasonality  Allergic conjunctivitis, unspecified laterality  Meibomianitis of left eye  Other orders -     cetirizine (ZYRTEC) 10 MG tablet; Take 1 tablet (10 mg total) by mouth at bedtime. -     Olopatadine HCl 0.2 % SOLN; Apply 1 drop to eye daily. -     erythromycin ophthalmic ointment; Place 1 application into both eyes at bedtime. -     nystatin (MYCOSTATIN/NYSTOP) powder; Apply 1 application topically 3 (three) times daily. -     hydrocortisone ointment 0.5 %; Apply 1 application topically 2 (two) times daily.  f/u prn

## 2020-06-23 NOTE — Telephone Encounter (Signed)
Is this ok to refill, reply to [facility]

## 2020-06-23 NOTE — Patient Instructions (Addendum)
Allergies:  Begin Cetrizine daily tablet at bedtime for allergies.  Use the cetirizine INSTAD of benadryl, diphenhydramine or Hydroxyzine also listed in the chart.    You can continue the day time allergy pill if it seems to help   If not improving, then stop the daytime pill  Begin Pataday allergy eye drops for the eye drainage or itching  You have the beginning of a stye   Begin Romycin/Erythromycin ointment in the eye at bedtime daily for a week   Rash  Begin Nystatin powders to the rash on breast/chest  For other rash on hands, face, etc. You can try the Hydrocortisone ointment sent to pharmacy   If not improved or worse over the next 7-10 days then let me know

## 2020-06-26 ENCOUNTER — Other Ambulatory Visit: Payer: Self-pay | Admitting: Medical

## 2020-06-26 ENCOUNTER — Telehealth: Payer: Self-pay | Admitting: Medical

## 2020-06-26 DIAGNOSIS — L255 Unspecified contact dermatitis due to plants, except food: Secondary | ICD-10-CM

## 2020-06-26 MED ORDER — PREDNISONE 10 MG (21) PO TBPK: ORAL_TABLET | Freq: Every day | ORAL | 0 refills | Status: DC

## 2020-06-26 MED ORDER — DOXYCYCLINE MONOHYDRATE 100 MG PO TABS: 100.0000 mg | ORAL_TABLET | Freq: Every day | ORAL | 0 refills | Status: DC

## 2020-06-26 MED ORDER — FLUCONAZOLE 100 MG PO TABS: 100.0000 mg | ORAL_TABLET | ORAL | 0 refills | Status: AC

## 2020-06-26 NOTE — Telephone Encounter (Signed)
Pt called and let pt know that rx was sent in and she states she will need something for a yeast infection since she will be taking a antibotic states everytime she takes a antibiotic she gets a yeast infection she would like diflucan

## 2020-06-26 NOTE — Telephone Encounter (Signed)
Left detail message on machine for patient.

## 2020-06-26 NOTE — Telephone Encounter (Signed)
Pt called and said she is still having issues with her rash she said it feels like bee stings. She wanted to know if she could try a steroid or a probiotic along with the powder

## 2020-06-26 NOTE — Telephone Encounter (Signed)
I sent 2 things to the pharmacy.  I sent prednisone Dosepak and doxycycline antibiotic once daily for 10 days given how red the 2 lesions under the breast were getting.

## 2020-06-30 ENCOUNTER — Other Ambulatory Visit: Payer: Self-pay | Admitting: Neurology

## 2020-06-30 ENCOUNTER — Other Ambulatory Visit: Payer: Self-pay | Admitting: Medical

## 2020-07-04 ENCOUNTER — Other Ambulatory Visit: Payer: Self-pay | Admitting: Medical

## 2020-07-14 ENCOUNTER — Other Ambulatory Visit: Payer: Self-pay | Admitting: Gastroenterology

## 2020-07-15 ENCOUNTER — Other Ambulatory Visit: Payer: Self-pay | Admitting: Medical

## 2020-07-18 LAB — HM DEXA SCAN

## 2020-07-18 LAB — HM MAMMOGRAPHY

## 2020-07-23 ENCOUNTER — Telehealth: Payer: Self-pay | Admitting: Medical

## 2020-07-23 ENCOUNTER — Encounter: Payer: Self-pay | Admitting: *Deleted

## 2020-07-23 NOTE — Telephone Encounter (Signed)
Patient given DEXA results.

## 2020-07-23 NOTE — Telephone Encounter (Signed)
Please call about their bone density study showing osteopenia or low bone mass  I do recommend they exercise regularly including aerobic and weight bearing exercise.   Weight-bearing physical activity and exercises that improve balance and posture can strengthen bones and reduce the chance of a fracture. The more active and fit you are as you age, the less likely you are to fall and break a bone.   Good nutrition. Eat a healthy diet and make certain that you're getting 1200mg  of calcium daily in the diet from dairy (typically 4 servings) or supplements OTC.   Also they should be getting Vitamin D through eating fish, seafood, getting sun exposure, and using either OTC Vitamin D supplement such as 600 IU daily   Lets plan to repeat the bone density study in 2 years.

## 2020-07-24 ENCOUNTER — Encounter: Payer: Self-pay | Admitting: Medical

## 2020-07-29 ENCOUNTER — Encounter: Payer: Self-pay | Admitting: Medical

## 2020-08-02 ENCOUNTER — Other Ambulatory Visit: Payer: Self-pay | Admitting: Medical

## 2020-08-07 ENCOUNTER — Other Ambulatory Visit: Payer: Self-pay | Admitting: Medical

## 2020-08-13 ENCOUNTER — Other Ambulatory Visit: Payer: Self-pay | Admitting: Medical

## 2020-08-30 ENCOUNTER — Other Ambulatory Visit: Payer: Self-pay | Admitting: Medical

## 2020-09-12 ENCOUNTER — Other Ambulatory Visit: Payer: Self-pay | Admitting: Medical

## 2020-09-12 ENCOUNTER — Telehealth: Payer: Self-pay

## 2020-09-12 MED ORDER — ZOLPIDEM TARTRATE 5 MG PO TABS: 5.0000 mg | ORAL_TABLET | Freq: Every evening | ORAL | 0 refills | Status: DC | PRN

## 2020-09-12 NOTE — Telephone Encounter (Signed)
Called stating she wanted to know if Emma [name] could call here in something to help her sleep. Her husband passed away 2 weeks ago and she has not been able to sleep since then. She has taken melatonin and they does not help at all. I told her I would have to send the message to you since Emma [name] is on vacation now. I also told her she may need an apt and she stated she didn't see why she needed an apt she just can't sleep since her husband passed.

## 2020-09-17 ENCOUNTER — Other Ambulatory Visit: Payer: Self-pay | Admitting: Medical

## 2020-09-30 ENCOUNTER — Ambulatory Visit (INDEPENDENT_AMBULATORY_CARE_PROVIDER_SITE_OTHER): Payer: Medicare Other | Admitting: Medical

## 2020-09-30 ENCOUNTER — Telehealth: Payer: Self-pay | Admitting: Medical

## 2020-09-30 VITALS — BP 128/70 | HR 78 | Temp 98.2°F | Wt 230.6 lb

## 2020-09-30 DIAGNOSIS — F5104 Psychophysiologic insomnia: Secondary | ICD-10-CM

## 2020-09-30 DIAGNOSIS — F32A Depression, unspecified: Secondary | ICD-10-CM

## 2020-09-30 DIAGNOSIS — Z79899 Other long term (current) drug therapy: Secondary | ICD-10-CM | POA: Diagnosis not present

## 2020-09-30 DIAGNOSIS — N1832 Chronic kidney disease, stage 3b: Secondary | ICD-10-CM

## 2020-09-30 DIAGNOSIS — E785 Hyperlipidemia, unspecified: Secondary | ICD-10-CM

## 2020-09-30 DIAGNOSIS — I10 Essential (primary) hypertension: Secondary | ICD-10-CM

## 2020-09-30 DIAGNOSIS — F419 Anxiety disorder, unspecified: Secondary | ICD-10-CM

## 2020-09-30 DIAGNOSIS — F4321 Adjustment disorder with depressed mood: Secondary | ICD-10-CM

## 2020-09-30 MED ORDER — MIRTAZAPINE 15 MG PO TABS: 15.0000 mg | ORAL_TABLET | Freq: Every day | ORAL | 0 refills | Status: DC

## 2020-09-30 MED ORDER — VERAPAMIL HCL ER 240 MG PO TBCR: EXTENDED_RELEASE_TABLET | ORAL | 3 refills | Status: DC

## 2020-09-30 MED ORDER — LISINOPRIL 20 MG PO TABS: 20.0000 mg | ORAL_TABLET | Freq: Every day | ORAL | 3 refills | Status: DC

## 2020-09-30 MED ORDER — ROSUVASTATIN CALCIUM 20 MG PO TABS: 20.0000 mg | ORAL_TABLET | Freq: Every day | ORAL | 1 refills | Status: DC

## 2020-09-30 NOTE — Telephone Encounter (Signed)
Dr Redmond School sent her in rx to help her sleep. She states it didn't really help a lot. It was about 3 nights before she could ever sleep. Would like something different to help her sleep Please call

## 2020-09-30 NOTE — Progress Notes (Signed)
Subjective:  Emma [name] is a 65 y.o. female who presents for Chief Complaint  Patient presents with   Insomnia     Here for sleep concerns.   Her husband passed away recently.  She is having trouble sleeping, and this is not a new problems.   Its just been a little worse of late.  Her biggest problem is that she is all alone trying to weed through mounds of legal paperwork.  She is handling the estate of her father who passed away 14 months ago, estate of husband who recently passed, and other stressors include daughter pregnant.  Her daughter had 4 miscarriages this past year.    She sees an Optometrist, Comptroller, other lawyers, and is overwhelmed.   She has no family to help.  She hasn't been taking several of the medications listed in the chart record.  She has significant trouble with getting to asleep and staying asleep.  Often watches the sunrise where she hasn't been asleep for hours.    Last visit Dr. Redmond School here prescribed some medications.  Neither xanax nor Ambien helped much.   She is not currently taking Buspar, benadryl, Ambien or xanax.  She may still have some soma she uses occasionally for spasm.  (Several medications in chart record had expired and she hasn't been taking them.)  She doesn't feel a need for counseling .    Effexor was apparently last filled 2021 by neurology, but she quit going to neurology.   Not currently on Effexor.   No other aggravating or relieving factors.    No other c/o.  Past Medical History:  Diagnosis Date   Chronic constipation    Cluster headache syndrome 04/27/13   Colon polyp 06/11/2011   Most recent 04/23/08, no polyp--mild diverticulosis.  Repeat 10 yrs.   Depression with anxiety 02/22/2008   Qualifier: Diagnosis of  By: Nolon Rod CMA (AAMA), Robin     Diabetes mellitus without complication ([place])    Diverticulosis    GERD (gastroesophageal reflux disease) 06/11/2011   Hypertension    Recently diagnosed   Migraine syndrome     Osteopenia 11/2016   bone density. plan repeat in 11/2018   Overweight(278.02) 06/11/2011   PUD (peptic ulcer disease) 04/23/08   Multiple antrum ulcers; h pylori neg   Rheumatoid arthritis(714.0)    Current Outpatient Medications on File Prior to Visit  Medication Sig Dispense Refill   acetaminophen (TYLENOL) 500 MG tablet Take 1,500 mg by mouth once as needed for mild pain or moderate pain.      BIOTIN PO Take 1 capsule by mouth daily.      calcium carbonate ([facility] CALCIUM) 1500 (600 Ca) MG TABS tablet TAKE 1 TABLET (600 MG TOTAL) BY MOUTH 2 (TWO) TIMES DAILY WITH A MEAL. 150 tablet 1   carisoprodol (SOMA) 350 MG tablet TAKE 1 TABLET BY MOUTH TWICE A DAY AND AT BEDTIME AS NEEDED FOR MUSCLE SPASMS  0   EPINEPHrine 0.3 mg/0.3 mL IJ SOAJ injection Inject 0.3 mg into the muscle as needed.     gabapentin (NEURONTIN) 300 MG capsule TAKE 1 CAPSULE BY MOUTH THREE TIMES A DAY AS NEEDED 270 capsule 0   hydrocortisone ointment 0.5 % Apply 1 application topically 2 (two) times daily. 30 g 0   metFORMIN (GLUCOPHAGE) 500 MG tablet TAKE 1 TABLET BY MOUTH EVERY DAY WITH BREAKFAST 90 tablet 0   Multiple Vitamins-Iron (MULTIVITAMINS WITH IRON) TABS tablet Take 1 tablet by mouth daily. 90 tablet 0  omeprazole (PRILOSEC) 40 MG capsule TAKE 1 CAPSULE BY MOUTH EVERY DAY 90 capsule 0   AMBULATORY NON FORMULARY MEDICATION Medication Name: *Nitroglycerin 0.125% gel. Apply a pea size amount to your rectum three times daily x 6-8 weeks (Patient not taking: No sig reported) 30 g 0   doxycycline (ADOXA) 100 MG tablet Take 1 tablet (100 mg total) by mouth daily. (Patient not taking: Reported on 09/30/2020) 10 tablet 0   hydroxychloroquine (PLAQUENIL) 200 MG tablet Take 400 mg by mouth daily.  (Patient not taking: No sig reported)  2   Insulin Pen Needle (B-D ULTRAFINE III SHORT PEN) 31G X 8 MM MISC USE AS DIRECTED (Patient not taking: Reported on 09/30/2020) 100 each 11   Olopatadine HCl 0.2 % SOLN INSTILL 1 DROP INTO AFFECTED  EYE EVERY DAY (Patient not taking: Reported on 09/30/2020) 5 mL 0   No current facility-administered medications on file prior to visit.     The following portions of the patient's history were reviewed and updated as appropriate: allergies, current medications, past family history, past medical history, past social history, past surgical history and problem list.  ROS Otherwise as in subjective above      Objective: BP 128/70   Pulse 78   Temp 98.2 F (36.8 C)   Wt 230 lb 9.6 oz (104.6 kg)   SpO2 96%   BMI 35.06 kg/m   General appearance: alert, no distress, well developed, well nourished Psych: pleasant, good eye contact, answers questions appropriately      Assessment: Encounter Diagnoses  Name Primary?   Psychophysiological insomnia Yes   High risk medication use    Stage 3b chronic kidney disease (HCC)    Hyperlipidemia, unspecified hyperlipidemia type    Essential hypertension    Anxiety and depression    Grieving      Plan: Discussed her concerns, sleep issues.    She was out of some medicaitons.   I think with her loss of her husband recently and financial and legal issues to take care of, she hasn't been taking care of herself or been as compliant  Refilled medications today  Insomnia - counseling on sleep hygiene, discussed medicaiton options, risks/benefits of medicaiton.   Begin trial of mirtazapine.  Didn't get great benefit with Ambien, xanax, others  Reconciled medicaiton today.  Lots of things she hasn't been taking in general and seems to be doing fine off several medications today.   Isla was seen today for insomnia.  Diagnoses and all orders for this visit:  Psychophysiological insomnia  High risk medication use  Stage 3b chronic kidney disease (HCC)  Hyperlipidemia, unspecified hyperlipidemia type  Essential hypertension  Anxiety and depression  Grieving  Other orders -     mirtazapine (REMERON) 15 MG tablet; Take 1  tablet (15 mg total) by mouth at bedtime. 1/2 or 1 tablet QHS -     verapamil (CALAN-SR) 240 MG CR tablet; TAKE 1 TABLET BY MOUTH EVERYDAY AT BEDTIME -     rosuvastatin (CRESTOR) 20 MG tablet; Take 1 tablet (20 mg total) by mouth daily. -     lisinopril (ZESTRIL) 20 MG tablet; Take 1 tablet (20 mg total) by mouth daily.   Follow up:  2 weeks

## 2020-10-03 ENCOUNTER — Telehealth: Payer: Self-pay | Admitting: Medical

## 2020-10-03 ENCOUNTER — Other Ambulatory Visit: Payer: Self-pay | Admitting: Medical

## 2020-10-03 NOTE — Telephone Encounter (Signed)
Pt called and states that the sleeping medicine that was prescribed is not working, she was still up at 6 this morning wide a awake, and is not resting at all,  She wants to know if there is something else she can take  Pt uses  Preferred Pharmacies     [id number] - [place], [place] - [address]

## 2020-10-10 ENCOUNTER — Telehealth (INDEPENDENT_AMBULATORY_CARE_PROVIDER_SITE_OTHER): Payer: Medicare Other | Admitting: Family Medicine

## 2020-10-10 ENCOUNTER — Other Ambulatory Visit: Payer: Self-pay

## 2020-10-10 VITALS — Temp 97.7°F | Wt 230.0 lb

## 2020-10-10 DIAGNOSIS — Z79899 Other long term (current) drug therapy: Secondary | ICD-10-CM

## 2020-10-10 DIAGNOSIS — M069 Rheumatoid arthritis, unspecified: Secondary | ICD-10-CM | POA: Diagnosis not present

## 2020-10-10 DIAGNOSIS — U071 COVID-19: Secondary | ICD-10-CM | POA: Diagnosis not present

## 2020-10-10 DIAGNOSIS — E118 Type 2 diabetes mellitus with unspecified complications: Secondary | ICD-10-CM | POA: Diagnosis not present

## 2020-10-10 MED ORDER — NIRMATRELVIR/RITONAVIR (PAXLOVID)TABLET
2.0000 | ORAL_TABLET | Freq: Two times a day (BID) | ORAL | 0 refills | Status: AC
Start: 2020-10-10 — End: 2020-10-15

## 2020-10-10 NOTE — Progress Notes (Signed)
   Subjective:    Patient ID: Emma [name], female    DOB: 05/27/1955, 65 y.o.   MRN: [id number]  HPI Documentation for virtual audio  telecommunications through [place] encounter: She is unable to connect with her phone or computer. The patient was located at home. 2 patient identifiers used.  The provider was located in the office. The patient did consent to this visit and is aware of possible charges through their insurance for this visit. The other persons participating in this telemedicine service were none. Time spent on call was 5 minutes and in review of previous records >20 minutes total for counseling and coordination of care. This virtual service is not related to other E/M service within previous 7 days.  She has a 1 day history of cough, fever to 102, rhinorrhea and myalgias.  She tested today and she is positive.  She has had 3 previous vaccines.  She does have underlying diabetes as well as rheumatoid arthritis.  Review of Systems     Objective:   Physical Exam Alert and in no distress with a hoarse voice and coughing.       Assessment & Plan:  COVID-19 - Plan: nirmatrelvir/ritonavir EUA (PAXLOVID) TABS  Diabetes mellitus with complication ([place])  High risk medication use  Rheumatoid arthritis, involving unspecified site, unspecified whether rheumatoid factor present ([place])  I think she is high risk for complications with rheumatoid arthritis and diabetes.  Discussed holding her statin as well as her blood pressure medications.  I will also have the pharmacist to discuss medication use while on the Paxil did.  She is to take Tylenol as well as ibuprofen to help with her fever, over-the-counter cough medication and keep yourself hydrated.  If she gets worse, explained that she might need to go to the hospital.  She expressed understanding of this.

## 2020-10-22 ENCOUNTER — Other Ambulatory Visit: Payer: Self-pay | Admitting: Medical

## 2021-01-19 ENCOUNTER — Other Ambulatory Visit: Payer: Self-pay | Admitting: Medical

## 2021-01-19 NOTE — Telephone Encounter (Signed)
I will send refill, but, schedule med check follow-up soon

## 2021-01-28 ENCOUNTER — Institutional Professional Consult (permissible substitution): Payer: Medicare Other | Admitting: Medical

## 2021-01-29 ENCOUNTER — Encounter: Payer: Self-pay | Admitting: Medical

## 2021-01-29 ENCOUNTER — Ambulatory Visit (INDEPENDENT_AMBULATORY_CARE_PROVIDER_SITE_OTHER): Payer: Medicare Other | Admitting: Medical

## 2021-01-29 ENCOUNTER — Other Ambulatory Visit: Payer: Self-pay

## 2021-01-29 VITALS — Wt 224.0 lb

## 2021-01-29 DIAGNOSIS — F419 Anxiety disorder, unspecified: Secondary | ICD-10-CM | POA: Diagnosis not present

## 2021-01-29 DIAGNOSIS — F4321 Adjustment disorder with depressed mood: Secondary | ICD-10-CM

## 2021-01-29 DIAGNOSIS — F32A Depression, unspecified: Secondary | ICD-10-CM

## 2021-01-29 DIAGNOSIS — G4700 Insomnia, unspecified: Secondary | ICD-10-CM | POA: Diagnosis not present

## 2021-01-29 DIAGNOSIS — F99 Mental disorder, not otherwise specified: Secondary | ICD-10-CM

## 2021-01-29 DIAGNOSIS — F349 Persistent mood [affective] disorder, unspecified: Secondary | ICD-10-CM | POA: Insufficient documentation

## 2021-01-29 NOTE — Progress Notes (Signed)
Subjective:     Patient ID: Emma [name], female   DOB: 20-Jan-1956, 65 y.o.   MRN: [id number]  This visit type was conducted due to national recommendations for restrictions regarding the COVID-19 Pandemic (e.g. social distancing) in an effort to limit this patient's exposure and mitigate transmission in our community.  Due to their co-morbid illnesses, this patient is at least at moderate risk for complications without adequate follow up.  This format is felt to be most appropriate for this patient at this time.    Documentation for virtual audio and video telecommunications through [place] encounter:  The patient was located at home. The provider was located in the office. The patient did consent to this visit and is aware of possible charges through their insurance for this visit.  The other persons participating in this telemedicine service were none. Time spent on call was 20 minutes and in review of previous records 20 minutes total.  This virtual service is not related to other E/M service within previous 7 days.   HPI Chief Complaint  Patient presents with   discuss letter for court    Letter for court. Been going through a lot and having trouble with the death of her brother, trying to give up elective rights for her brother and has to go to court for this. She is not stable for this   Virtual consult for letter.   She is having difficulty with the legal system.  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  She has been working to handle his estate for several months along with his son and daughter.  However due to her emotional state, anxiety and ongoing grief, she does not feel fit to be the executor at this point.   She notes that her brothers will states that if she was unfit to her unable to be the executor then his son would be the acting executor.  However his daughter is pressing issue in does not want Emma [name] to be exempt from executorship.  She is taking her to court  over this.  She has to appear in court next week about this issue.  She needs a letter to help stay on her case that she is unfit to take on that role as executor given her emotional state  In the past 1.5-year she has had numerous deaths in the family including her father, her husband Arnell Sieving died in 2022/09/26, her brother who she was closest to all died of [place] in 11-26-2019 and her aunt died 2 months later [place] as well.  She has had a hard time dealing with all the different deaths in her family.  In addition to this her daughter has had 4 miscarriages which has been difficult to bear.  She still uses medication to help with her anxiety and sleep.  No suicidal or homicidal ideation.  No other aggravating or relieving factors. No other complaint.    Past Medical History:  Diagnosis Date   Chronic constipation    Cluster headache syndrome 04/27/13   Colon polyp 06/11/2011   Most recent 04/23/08, no polyp--mild diverticulosis.  Repeat 10 yrs.   Depression with anxiety 02/22/2008   Qualifier: Diagnosis of  By: Nolon Rod CMA (AAMA), Robin     Diabetes mellitus without complication ([place])    Diverticulosis    GERD (gastroesophageal reflux disease) 06/11/2011   Hypertension    Recently diagnosed   Migraine syndrome    Osteopenia 11/2016   bone density. plan repeat  in 11/2018   Overweight(278.02) 06/11/2011   PUD (peptic ulcer disease) 04/23/08   Multiple antrum ulcers; h pylori neg   Rheumatoid arthritis(714.0)    Current Outpatient Medications on File Prior to Visit  Medication Sig Dispense Refill   acetaminophen (TYLENOL) 500 MG tablet Take 1,500 mg by mouth once as needed for mild pain or moderate pain.      BIOTIN PO Take 1 capsule by mouth daily.      calcium carbonate ([facility]S CALCIUM) 1500 (600 Ca) MG TABS tablet TAKE 1 TABLET (600 MG TOTAL) BY MOUTH 2 (TWO) TIMES DAILY WITH A MEAL. 150 tablet 1   carisoprodol (SOMA) 350 MG tablet TAKE 1 TABLET BY MOUTH TWICE A DAY AND AT BEDTIME AS NEEDED  FOR MUSCLE SPASMS  0   gabapentin (NEURONTIN) 300 MG capsule TAKE 1 CAPSULE BY MOUTH THREE TIMES A DAY AS NEEDED 270 capsule 0   hydrocortisone ointment 0.5 % Apply 1 application topically 2 (two) times daily. 30 g 0   hydroxychloroquine (PLAQUENIL) 200 MG tablet Take 400 mg by mouth daily.  2   lisinopril (ZESTRIL) 20 MG tablet Take 1 tablet (20 mg total) by mouth daily. 90 tablet 3   metFORMIN (GLUCOPHAGE) 500 MG tablet TAKE 1 TABLET BY MOUTH EVERY DAY WITH BREAKFAST 90 tablet 0   mirtazapine (REMERON) 15 MG tablet TAKE 1/2 OR 1 TABLET BY MOUTH AT BEDTIME 90 tablet 0   Multiple Vitamins-Iron (MULTIVITAMINS WITH IRON) TABS tablet Take 1 tablet by mouth daily. 90 tablet 0   omeprazole (PRILOSEC) 40 MG capsule TAKE 1 CAPSULE BY MOUTH EVERY DAY 90 capsule 0   rosuvastatin (CRESTOR) 20 MG tablet Take 1 tablet (20 mg total) by mouth daily. 90 tablet 1   verapamil (CALAN-SR) 240 MG CR tablet TAKE 1 TABLET BY MOUTH EVERYDAY AT BEDTIME 90 tablet 3   EPINEPHrine 0.3 mg/0.3 mL IJ SOAJ injection Inject 0.3 mg into the muscle as needed.     Insulin Pen Needle (B-D ULTRAFINE III SHORT PEN) 31G X 8 MM MISC USE AS DIRECTED (Patient not taking: No sig reported) 100 each 11   No current facility-administered medications on file prior to visit.    Review of Systems As in subjective    Objective:   Physical Exam Due to coronavirus pandemic stay at home measures, patient visit was virtual and they were not examined in person.   Wt 224 lb (101.6 kg)   BMI 34.06 kg/m   Gen: nad Psych: pleasant, answered questions appropriately     Assessment:     Encounter Diagnoses  Name Primary?   Anxiety and depression Yes   Insomnia, unspecified type    Grieving    Emotional disorder        Plan:     We discussed her symptoms and concerns in the letter request.  She has had a hard time with grieving this past year due to multiple deaths in the family, has significant anxiety and emotional issues.  She is  not in a position to handle the stress required of her with this executorship.  I will write a letter on her behalf.  Morgaine was seen today for discuss letter for court.  Diagnoses and all orders for this visit:  Anxiety and depression  Insomnia, unspecified type  Grieving  Emotional disorder  F/u soon for routine chronic disease follow up.

## 2021-02-09 ENCOUNTER — Ambulatory Visit: Payer: Medicare Other | Admitting: Medical

## 2021-02-16 ENCOUNTER — Encounter: Payer: Self-pay | Admitting: Gastroenterology

## 2021-02-16 ENCOUNTER — Ambulatory Visit (INDEPENDENT_AMBULATORY_CARE_PROVIDER_SITE_OTHER): Payer: Medicare Other | Admitting: Gastroenterology

## 2021-02-16 VITALS — BP 140/80 | HR 78 | Ht 66.0 in | Wt 231.0 lb

## 2021-02-16 DIAGNOSIS — Z86010 Personal history of colonic polyps: Secondary | ICD-10-CM

## 2021-02-16 DIAGNOSIS — R110 Nausea: Secondary | ICD-10-CM

## 2021-02-16 DIAGNOSIS — R194 Change in bowel habit: Secondary | ICD-10-CM | POA: Diagnosis not present

## 2021-02-16 DIAGNOSIS — F411 Generalized anxiety disorder: Secondary | ICD-10-CM | POA: Diagnosis not present

## 2021-02-16 MED ORDER — CITRUCEL PO POWD: ORAL | Status: DC

## 2021-02-16 MED ORDER — ONDANSETRON 4 MG PO TBDP: 4.0000 mg | ORAL_TABLET | Freq: Four times a day (QID) | ORAL | 1 refills | Status: DC | PRN

## 2021-02-16 MED ORDER — DICYCLOMINE HCL 10 MG PO CAPS: 10.0000 mg | ORAL_CAPSULE | Freq: Three times a day (TID) | ORAL | 1 refills | Status: DC | PRN

## 2021-02-16 NOTE — Patient Instructions (Addendum)
If you are age 67 or older, your body mass index should be between 23-30. Your Body mass index is 37.28 kg/m. If this is out of the aforementioned range listed, please consider follow up with your Primary Care Provider.  If you are age 74 or younger, your body mass index should be between 19-25. Your Body mass index is 37.28 kg/m. If this is out of the aformentioned range listed, please consider follow up with your Primary Care Provider.   ________________________________________________________  The [facility] providers would like to encourage you to use [facility] to communicate with providers for non-urgent requests or questions.  Due to long hold times on the telephone, sending your provider a message by [facility] may be a faster and more efficient way to get a response.  Please allow 48 business hours for a response.  Please remember that this is for non-urgent requests.  _______________________________________________________  Please purchase the following medications over the counter and take as directed:  Citrucel - Take once daily  Discontinue Amitiza  We have sent the following medications to your pharmacy for you to pick up at your convenience: Bentyl: Take 1 to 2 tables every 8 hours as needed for abdominal cramping Zofran 4 mg ODT (orally dissolving tablet): Take every 4 to 6 hours as needed  You will be due for a recall colonoscopy in 2024. We will send you a reminder in the mail when it gets closer to that time.   Thank you for entrusting me with your care and for choosing [facility], Dr. [place] Cellar

## 2021-02-16 NOTE — Progress Notes (Signed)
HPI :  65 year old female here for a follow-up visit for altered bowel habits.  Recall I have seen her in the past for chronic constipation, hemorrhoid banding, dyspepsia, anxiety.  Recall she has been on a variety of regimens for constipation in the past to include Linzess, Amitiza, MiraLAX, Motegrity, Trulance.    At her last visit a few years ago she was having some abdominal pain and early satiety, problems eating.  We pursued CT scan which did not show any concerning pathology.  She then had a gastric emptying study which was normal.  Her bowels have changed since of last seen.  Somewhere along the line she stopped taking Trulance on a routine basis.  She states her bowels alternate from anywhere from constipated to loose stools.  She states they are typically 50-50 both constipated and loose.  She is on the medication regimen listed below and denies any new changes.  She has been on a stable dose of metformin for some time.  Is not seeing any blood in her stools.  She has some abdominal cramping in her lower abdomen with a bowel movement that is relieved with a bowel.  She is not using any NSAIDs.  She is taking omeprazole for reflux, doing well in that regard.  No early satiety.  She is eating okay but does have some nausea in the morning when she wakes up but no vomiting.  I do not see Amitiza on her medication list but she states she takes it as needed when her stomach hurts but not for constipation.  She subjectively thinks that a lot of her symptoms are related to stress and anxiety.  Her husband passed away this past 09/25/2022, he had Alzheimer's.  Her brother died of COVID this past year at age of 66s.  Her father passed away and her aunt passed away this past year as well and the holidays have been extremely difficult for her.  She feels that her stress and anxiety is related to her bowel function.  Her last endoscopy and colonoscopy is as listed below.  Most recent endoscopic evaluation:  EGD  05/23/2017 - normal esophagus, stomach, with brunner's gland hyperplasia of the bulb, bx for HP negative Colonoscopy 05/23/2017 - 2 ascending colon polyps, anal fissure, internal hemorrhoids - path c/w sessile serrated polyps. Repeat 5 years   CT abdomen / pelvis 03/07/19 - IMPRESSION: Unremarkable exam. No acute findings or other significant abnormality  Gastric emptying study -03/28/19 - normal    Past Medical History:  Diagnosis Date   Chronic constipation    Cluster headache syndrome 04/27/13   Colon polyp 06/11/2011   Most recent 04/23/08, no polyp--mild diverticulosis.  Repeat 10 yrs.   Depression with anxiety 02/22/2008   Qualifier: Diagnosis of  By: Nolon Rod CMA (AAMA), Robin     Diabetes mellitus without complication ([place])    Diverticulosis    GERD (gastroesophageal reflux disease) 06/11/2011   Hypertension    Recently diagnosed   Migraine syndrome    Osteopenia 11/2016   bone density. plan repeat in 11/2018   Overweight(278.02) 06/11/2011   PUD (peptic ulcer disease) 04/23/08   Multiple antrum ulcers; h pylori neg   Rheumatoid arthritis(714.0)      Past Surgical History:  Procedure Laterality Date   ABDOMINAL HYSTERECTOMY     APPENDECTOMY     BACK SURGERY     3 times   CARDIOVASCULAR STRESS TEST  06/22/2011   Normal; EF 65%   CHOLECYSTECTOMY  COLONOSCOPY  04/23/2008   mild diverticulosis, internal hemorrhoids; Dr. Erskine Emery   ENDOVENOUS ABLATION SAPHENOUS VEIN W/ LASER Left 06/07/2019   endovenous laser ablation left greater saphenous vein and stab phlebectomy < 10 incisions left leg by Gae Gallop MD   ENDOVENOUS ABLATION SAPHENOUS VEIN W/ LASER Right 12/27/2019   LIVER BIOPSY  03/2007   Chronic active hepatitis w/fibrosis--related to hx of methotrexate treatment.   oral implants  2022   SKIN BIOPSY  08/16/2017   superficial basal cell carcinoma left inferior medial anterior tibia    Family History  Problem Relation Age of Onset   Diabetes Mother     Stroke Mother    Arthritis Father    Hypertension Father    Atrial fibrillation Father    Alcohol abuse Sister    Cancer Sister        mesothelioma   Heart disease Sister        cardiomegaly   Hypertension Sister    Pulmonary embolism Sister    Hypertension Brother    Polycystic ovary syndrome Daughter    Heart disease Maternal Grandmother    Heart disease Maternal Grandfather    Colon cancer Paternal Uncle    Esophageal cancer Neg Hx    Stomach cancer Neg Hx    Social History   Tobacco Use   Smoking status: Never   Smokeless tobacco: Never  Vaping Use   Vaping Use: Never used  Substance Use Topics   Alcohol use: Not Currently   Drug use: No   Current Outpatient Medications  Medication Sig Dispense Refill   acetaminophen (TYLENOL) 500 MG tablet Take 1,500 mg by mouth once as needed for mild pain or moderate pain.      BIOTIN PO Take 1 capsule by mouth daily.      calcium carbonate ([employer] CALCIUM) 1500 (600 Ca) MG TABS tablet TAKE 1 TABLET (600 MG TOTAL) BY MOUTH 2 (TWO) TIMES DAILY WITH A MEAL. 150 tablet 1   carisoprodol (SOMA) 350 MG tablet TAKE 1 TABLET BY MOUTH TWICE A DAY AND AT BEDTIME AS NEEDED FOR MUSCLE SPASMS  0   Docusate Calcium (STOOL SOFTENER PO) Take 1 capsule by mouth as needed.     gabapentin (NEURONTIN) 300 MG capsule TAKE 1 CAPSULE BY MOUTH THREE TIMES A DAY AS NEEDED 270 capsule 0   hydroxychloroquine (PLAQUENIL) 200 MG tablet Take 400 mg by mouth daily.  2   lisinopril (ZESTRIL) 20 MG tablet Take 1 tablet (20 mg total) by mouth daily. 90 tablet 3   metFORMIN (GLUCOPHAGE) 500 MG tablet TAKE 1 TABLET BY MOUTH EVERY DAY WITH BREAKFAST 90 tablet 0   mirtazapine (REMERON) 15 MG tablet TAKE 1/2 OR 1 TABLET BY MOUTH AT BEDTIME (Patient taking differently: 15-30 mg at bedtime as needed.) 90 tablet 0   Multiple Vitamins-Iron (MULTIVITAMINS WITH IRON) TABS tablet Take 1 tablet by mouth daily. 90 tablet 0   omeprazole (PRILOSEC) 40 MG capsule TAKE 1 CAPSULE BY MOUTH  EVERY DAY 90 capsule 0   rosuvastatin (CRESTOR) 20 MG tablet Take 1 tablet (20 mg total) by mouth daily. 90 tablet 1   verapamil (CALAN-SR) 240 MG CR tablet TAKE 1 TABLET BY MOUTH EVERYDAY AT BEDTIME 90 tablet 3   No current facility-administered medications for this visit.   Allergies  Allergen Reactions   Bee Venom Anaphylaxis     Review of Systems: All systems reviewed and negative except where noted in HPI.   Lab Results  Component Value  Date   WBC 6.5 07/10/2019   HGB 11.2 07/10/2019   HCT 34.0 07/10/2019   MCV 88 07/10/2019   PLT 236 07/10/2019    Lab Results  Component Value Date   CREATININE 1.03 (H) 01/28/2020   BUN 11 01/28/2020   NA 141 01/28/2020   K 4.3 01/28/2020   CL 105 01/28/2020   CO2 22 01/28/2020    Lab Results  Component Value Date   ALT 13 07/10/2019   AST 12 07/10/2019   ALKPHOS 67 07/10/2019   BILITOT 0.3 07/10/2019     Physical Exam: BP 140/80   Pulse 78   Ht 5\' 6"  (1.676 m)   Wt 231 lb (104.8 kg)   BMI 37.28 kg/m  Constitutional: Pleasant,well-developed, female in no acute distress. Neurological: Alert and oriented to person place and time. Psychiatric: Normal mood and affect. Behavior is normal.   ASSESSMENT AND PLAN: 65 year old female here for reassessment of the following:  Altered bowel habits Nausea Anxiety History of colon polyps  Has suffered from chronic constipation for some time on multiple regimens as above.  Over the past year or 2 her bowel habits have changed and she thinks between loose stools and constipated stools.  Her colonoscopy is up-to-date.  She is under a great deal of anxiety, having a hard time around the holidays due to stress anxiety following the loss of multiple family members over the past year or 2.  She is having hard time coping with this and feels it is related to her bowel dysfunction.  We discussed some options.  Recommend using Citrucel powder once daily mixed with water to see if I can  provide some regularity.  Advised her to stop Amitiza if that is indeed what she is taking.  We will give her some Bentyl to take as needed for abdominal cramps, she can take some Zofran as needed for nausea if she gets that.  She is due for colonoscopy in March 2024.  She will otherwise continue her present regimen.  She should follow-up with her primary care for anxiety that persist despite her regimen.  She can follow-up with me as needed moving forward and I asked her to contact me in a few weeks if her bowels are not better on this regimen.  She agreed  Plan: - start Citrucel - once daily OTC - stop Amitiza PRN (taking for pain?) - trial of Bentyl 10mg  - 1-2 tabs every 8 hours PRN #30 RF1 - start Zofran 4mg  ODT every 4-6 hour PRN #30 RF1 - plan on surveillance Colonoscopy 05/2022 - follow up with PCP for anxiety  Jolly Mango, MD [facility]

## 2021-02-19 ENCOUNTER — Other Ambulatory Visit: Payer: Self-pay

## 2021-02-19 ENCOUNTER — Ambulatory Visit (INDEPENDENT_AMBULATORY_CARE_PROVIDER_SITE_OTHER): Payer: Medicare Other | Admitting: Medical

## 2021-02-19 VITALS — BP 122/68 | HR 65 | Wt 233.4 lb

## 2021-02-19 DIAGNOSIS — Z79899 Other long term (current) drug therapy: Secondary | ICD-10-CM

## 2021-02-19 DIAGNOSIS — R809 Proteinuria, unspecified: Secondary | ICD-10-CM

## 2021-02-19 DIAGNOSIS — I10 Essential (primary) hypertension: Secondary | ICD-10-CM | POA: Diagnosis not present

## 2021-02-19 DIAGNOSIS — E118 Type 2 diabetes mellitus with unspecified complications: Secondary | ICD-10-CM | POA: Diagnosis not present

## 2021-02-19 DIAGNOSIS — E785 Hyperlipidemia, unspecified: Secondary | ICD-10-CM

## 2021-02-19 DIAGNOSIS — Z7185 Encounter for immunization safety counseling: Secondary | ICD-10-CM

## 2021-02-19 DIAGNOSIS — F5104 Psychophysiologic insomnia: Secondary | ICD-10-CM

## 2021-02-19 DIAGNOSIS — Z23 Encounter for immunization: Secondary | ICD-10-CM

## 2021-02-19 DIAGNOSIS — I8393 Asymptomatic varicose veins of bilateral lower extremities: Secondary | ICD-10-CM

## 2021-02-19 DIAGNOSIS — Z136 Encounter for screening for cardiovascular disorders: Secondary | ICD-10-CM | POA: Insufficient documentation

## 2021-02-19 DIAGNOSIS — N1831 Chronic kidney disease, stage 3a: Secondary | ICD-10-CM | POA: Insufficient documentation

## 2021-02-19 DIAGNOSIS — I517 Cardiomegaly: Secondary | ICD-10-CM

## 2021-02-19 LAB — POCT GLYCOSYLATED HEMOGLOBIN (HGB A1C): Hemoglobin A1C: 6.4 % — AB (ref 4.0–5.6)

## 2021-02-19 NOTE — Progress Notes (Signed)
Subjective:  Emma [name] is a 65 y.o. female who presents for Chief Complaint  Patient presents with   diabetes    Diabetes, no other concerns      Primary Care Provider Somnang Mahan, Camelia Eng, PA-C here for primary care   Current Health Care Team: Dentist, Dr. Reinaldo Raddle  Eye doctor, n/a  Dr. [place] Cellar, GI Dr. Deitra Mayo, vascular surgery Dr. Marcial Pacas, Neurology Dr. Amil Amen, Rheumatology   Here for med check today.  She was 22 minutes late today which limited our discussion time.   Saw GI/Dr.Armburster recently for nausea and belly pain.  Has always had bowel issues and constipation, but sometimes diarrhea.  Mostly related to anxiety.  She was started back on Amitiza  Diabetes-compliant with metformin 500 mg daily.  Not checking sugars.   Doesn't have a glucometer currently.   No recent urinary polydipsia vision change or other new symptoms.  She would like to try medicine like Ozempic.  Her daughter has done really well on this medicine lost a lot of weight.  She is interested in something like this to help with weight and diabetes.  She denies any history of pancreatitis or family history of thyroid cancer  Hypertension-compliant with lisinopril 20 mg daily  She is on verapamil for migraines and blood pressure, but was primarily put on this for migraines prevention  Hyperlipidemia-compliant with Crestor 20 mg daily  Chronic insomnia-mirtazapine really helps.  She wants to continue this  Her husband Arnell Sieving died earlier this year.  Thanksgiving was one of her favorite holidays and this year was really difficult having so many people gone in her family  No other aggravating or relieving factors.    No other c/o.  Past Medical History:  Diagnosis Date   Chronic constipation    Cluster headache syndrome 04/27/13   Colon polyp 06/11/2011   Most recent 04/23/08, no polyp--mild diverticulosis.  Repeat 10 yrs.   Depression with anxiety 02/22/2008   Qualifier:  Diagnosis of  By: Nolon Rod CMA (AAMA), Robin     Diabetes mellitus without complication ([place])    Diverticulosis    GERD (gastroesophageal reflux disease) 06/11/2011   Hypertension    Recently diagnosed   Migraine syndrome    Osteopenia 11/2016   bone density. plan repeat in 11/2018   Overweight(278.02) 06/11/2011   PUD (peptic ulcer disease) 04/23/08   Multiple antrum ulcers; h pylori neg   Rheumatoid arthritis(714.0)    Current Outpatient Medications on File Prior to Visit  Medication Sig Dispense Refill   acetaminophen (TYLENOL) 500 MG tablet Take 1,500 mg by mouth once as needed for mild pain or moderate pain.      BIOTIN PO Take 1 capsule by mouth daily.      calcium carbonate ([employer]S CALCIUM) 1500 (600 Ca) MG TABS tablet TAKE 1 TABLET (600 MG TOTAL) BY MOUTH 2 (TWO) TIMES DAILY WITH A MEAL. 150 tablet 1   carisoprodol (SOMA) 350 MG tablet TAKE 1 TABLET BY MOUTH TWICE A DAY AND AT BEDTIME AS NEEDED FOR MUSCLE SPASMS  0   dicyclomine (BENTYL) 10 MG capsule Take 1-2 capsules (10-20 mg total) by mouth every 8 (eight) hours as needed for spasms. 30 capsule 1   Docusate Calcium (STOOL SOFTENER PO) Take 1 capsule by mouth as needed.     gabapentin (NEURONTIN) 300 MG capsule TAKE 1 CAPSULE BY MOUTH THREE TIMES A DAY AS NEEDED 270 capsule 0   hydroxychloroquine (PLAQUENIL) 200 MG tablet Take 400 mg by  mouth daily.  2   lisinopril (ZESTRIL) 20 MG tablet Take 1 tablet (20 mg total) by mouth daily. 90 tablet 3   metFORMIN (GLUCOPHAGE) 500 MG tablet TAKE 1 TABLET BY MOUTH EVERY DAY WITH BREAKFAST 90 tablet 0   methylcellulose (CITRUCEL) oral powder Take daily     mirtazapine (REMERON) 15 MG tablet TAKE 1/2 OR 1 TABLET BY MOUTH AT BEDTIME (Patient taking differently: 15-30 mg at bedtime as needed.) 90 tablet 0   Multiple Vitamins-Iron (MULTIVITAMINS WITH IRON) TABS tablet Take 1 tablet by mouth daily. 90 tablet 0   omeprazole (PRILOSEC) 40 MG capsule TAKE 1 CAPSULE BY MOUTH EVERY DAY 90 capsule 0    ondansetron (ZOFRAN-ODT) 4 MG disintegrating tablet Take 1 tablet (4 mg total) by mouth every 6 (six) hours as needed for nausea or vomiting. 30 tablet 1   rosuvastatin (CRESTOR) 20 MG tablet Take 1 tablet (20 mg total) by mouth daily. 90 tablet 1   verapamil (CALAN-SR) 240 MG CR tablet TAKE 1 TABLET BY MOUTH EVERYDAY AT BEDTIME 90 tablet 3   No current facility-administered medications on file prior to visit.     The following portions of the patient's history were reviewed and updated as appropriate: allergies, current medications, past family history, past medical history, past social history, past surgical history and problem list.  ROS Otherwise as in subjective above    Objective: BP 122/68   Pulse 65   Wt 233 lb 6.4 oz (105.9 kg)   BMI 37.67 kg/m   General appearance: alert, no distress, well developed, well nourished Neck: supple, no lymphadenopathy, no thyromegaly, no masses, no bruits Heart: RRR, normal S1, S2, no murmurs Lungs: CTA bilaterally, no wheezes, rhonchi, or rales Pulses: 2+ radial pulses, 1+ pedal pulses, normal cap refill Ext: no edema Psych: mood somewhat down, but answers questions appropriately  Diabetic Foot Exam - Simple   Simple Foot Form Diabetic Foot exam was performed with the following findings: Yes 02/19/2021 12:38 PM  Visual Inspection No deformities, no ulcerations, no other skin breakdown bilaterally: Yes Sensation Testing Intact to touch and monofilament testing bilaterally: Yes Pulse Check See comments: Yes Comments 1+ pedal pulses       Assessment: Encounter Diagnoses  Name Primary?   Diabetes mellitus with complication ([place]) Yes   Essential hypertension    High risk medication use    Hyperlipidemia, unspecified hyperlipidemia type    Microalbuminuria    Stage 3a chronic kidney disease (HCC)    Vaccine counseling    Varicose veins of both lower extremities, unspecified whether complicated    Need for pneumococcal  vaccination    Screening for heart disease    LVH (left ventricular hypertrophy)    Chronic insomnia      Plan: Diabetes Update labs today I gave her a prescription for glucometer so she can start testing.  Discussed home monitoring in general Pending labs we will likely add a GLP-1 medication and continue metformin once daily See eye doctor yearly Check feet daily for sores or wounds  High blood pressure continue lisinopril 20 mg daily Of note she is primarily on verapamil for migraine prevention  Hyperlipidemia Continue rosuvastatin Crestor 20 mg daily  Chronic insomnia She has seen a big improvement on mirtazapine  Chronic constipation Recently saw gastroenterology and was started on Citrucel once daily, trial of Bentyl, Zofran as needed They plan to repeat colonoscopy 05/2022  Unfortunate she has had a lot of family members die this past year including her  husband, so this has been a stressful time for her.  We had discussed counseling the last time I spoke to her.  Again recommend she consider this.  I will go ahead and refer to cardiology for updated evaluation.  She does not have any particular symptoms but her last echocardiogram was 2015 showing some LVH.  She certainly has risk factors for heart disease.  CKD 3-updated labs today, continue good water intake, continue to try to maintain good blood pressure and diabetes control   Counseled on the pneumococcal vaccine.  Vaccine information sheet given.  Pneumococcal vaccine PPSV20 given after consent obtained.   La[place]hia was seen today for diabetes.  Diagnoses and all orders for this visit:  Diabetes mellitus with complication ([place]) -     Comprehensive metabolic panel -     CBC -     Hemoglobin A1c -     HgB A1c -     Ambulatory referral to Cardiology  Essential hypertension -     Comprehensive metabolic panel -     Ambulatory referral to Cardiology  High risk medication use -     Comprehensive metabolic  panel -     CBC  Hyperlipidemia, unspecified hyperlipidemia type -     Comprehensive metabolic panel -     Lipid panel -     Ambulatory referral to Cardiology  Microalbuminuria -     Microalbumin/Creatinine Ratio, Urine  Stage 3a chronic kidney disease (HCC) -     Comprehensive metabolic panel -     Microalbumin/Creatinine Ratio, Urine -     Ambulatory referral to Cardiology  Vaccine counseling  Varicose veins of both lower extremities, unspecified whether complicated  Need for pneumococcal vaccination -     Pneumococcal conjugate vaccine 20-valent  Screening for heart disease -     Ambulatory referral to Cardiology  LVH (left ventricular hypertrophy) -     Ambulatory referral to Cardiology  Chronic insomnia  Spent > 45 minutes face to face with patient in discussion of symptoms, evaluation, plan and recommendations.    Follow up: pending labs

## 2021-02-20 ENCOUNTER — Other Ambulatory Visit: Payer: Self-pay | Admitting: Medical

## 2021-02-20 ENCOUNTER — Other Ambulatory Visit: Payer: Self-pay | Admitting: Internal Medicine

## 2021-02-20 DIAGNOSIS — N1831 Chronic kidney disease, stage 3a: Secondary | ICD-10-CM

## 2021-02-20 LAB — COMPREHENSIVE METABOLIC PANEL
ALT: 16 IU/L (ref 0–32)
AST: 13 IU/L (ref 0–40)
Albumin/Globulin Ratio: 1.6 (ref 1.2–2.2)
Albumin: 4.1 g/dL (ref 3.8–4.8)
Alkaline Phosphatase: 69 IU/L (ref 44–121)
BUN/Creatinine Ratio: 12 (ref 12–28)
BUN: 15 mg/dL (ref 8–27)
Bilirubin Total: <0.2 mg/dL (ref 0.0–1.2)
CO2: 26 mmol/L (ref 20–29)
Calcium: 8.9 mg/dL (ref 8.7–10.3)
Chloride: 105 mmol/L (ref 96–106)
Creatinine, Ser: 1.22 mg/dL — ABNORMAL HIGH (ref 0.57–1.00)
Globulin, Total: 2.5 g/dL (ref 1.5–4.5)
Glucose: 108 mg/dL — ABNORMAL HIGH (ref 70–99)
Potassium: 4.5 mmol/L (ref 3.5–5.2)
Sodium: 144 mmol/L (ref 134–144)
Total Protein: 6.6 g/dL (ref 6.0–8.5)
eGFR: 49 mL/min/{1.73_m2} — ABNORMAL LOW (ref >59)

## 2021-02-20 LAB — HEMOGLOBIN A1C
Est. average glucose Bld gHb Est-mCnc: 143 mg/dL
Hgb A1c MFr Bld: 6.6 % — ABNORMAL HIGH (ref 4.8–5.6)

## 2021-02-20 LAB — CBC
Hematocrit: 38 % (ref 34.0–46.6)
Hemoglobin: 12.7 g/dL (ref 11.1–15.9)
MCH: 30.2 pg (ref 26.6–33.0)
MCHC: 33.4 g/dL (ref 31.5–35.7)
MCV: 90 fL (ref 79–97)
Platelets: 275 10*3/uL (ref 150–450)
RBC: 4.21 x10E6/uL (ref 3.77–5.28)
RDW: 11.8 % (ref 11.7–15.4)
WBC: 6.5 10*3/uL (ref 3.4–10.8)

## 2021-02-20 LAB — LIPID PANEL
Chol/HDL Ratio: 2 ratio (ref 0.0–4.4)
Cholesterol, Total: 116 mg/dL (ref 100–199)
HDL: 59 mg/dL (ref >39)
LDL Chol Calc (NIH): 38 mg/dL (ref 0–99)
Triglycerides: 104 mg/dL (ref 0–149)
VLDL Cholesterol Cal: 19 mg/dL (ref 5–40)

## 2021-02-20 LAB — MICROALBUMIN / CREATININE URINE RATIO
Creatinine, Urine: 51.7 mg/dL
Microalb/Creat Ratio: 576 mg/g creat — ABNORMAL HIGH (ref 0–29)
Microalbumin, Urine: 298 ug/mL

## 2021-02-20 MED ORDER — ROSUVASTATIN CALCIUM 20 MG PO TABS: 20.0000 mg | ORAL_TABLET | Freq: Every day | ORAL | 3 refills | Status: DC

## 2021-02-20 MED ORDER — METFORMIN HCL 500 MG PO TABS: ORAL_TABLET | ORAL | 1 refills | Status: DC

## 2021-02-20 MED ORDER — OMEPRAZOLE 40 MG PO CPDR: DELAYED_RELEASE_CAPSULE | ORAL | 1 refills | Status: DC

## 2021-02-27 ENCOUNTER — Other Ambulatory Visit: Payer: Self-pay | Admitting: Medical

## 2021-03-05 ENCOUNTER — Telehealth: Payer: Self-pay | Admitting: Medical

## 2021-03-05 NOTE — Telephone Encounter (Signed)
Left message for patient to call back and schedule Medicare Annual Wellness Visit (AWV) either virtually or in office. I left my number for patient to call [phone].  Last AWV 07/10/19  please schedule at anytime with health coach  This should be a 45 minute visit.

## 2021-03-18 ENCOUNTER — Other Ambulatory Visit: Payer: Self-pay | Admitting: Nephrology

## 2021-03-18 ENCOUNTER — Other Ambulatory Visit (HOSPITAL_COMMUNITY): Payer: Self-pay | Admitting: Nephrology

## 2021-03-18 DIAGNOSIS — E1129 Type 2 diabetes mellitus with other diabetic kidney complication: Secondary | ICD-10-CM

## 2021-03-18 DIAGNOSIS — E1122 Type 2 diabetes mellitus with diabetic chronic kidney disease: Secondary | ICD-10-CM

## 2021-03-18 DIAGNOSIS — I129 Hypertensive chronic kidney disease with stage 1 through stage 4 chronic kidney disease, or unspecified chronic kidney disease: Secondary | ICD-10-CM

## 2021-03-27 ENCOUNTER — Other Ambulatory Visit: Payer: Self-pay

## 2021-03-27 ENCOUNTER — Ambulatory Visit (INDEPENDENT_AMBULATORY_CARE_PROVIDER_SITE_OTHER): Payer: Medicare Other | Admitting: Internal Medicine

## 2021-03-27 ENCOUNTER — Encounter: Payer: Self-pay | Admitting: Internal Medicine

## 2021-03-27 VITALS — BP 146/77 | HR 63 | Ht 67.0 in | Wt 239.0 lb

## 2021-03-27 DIAGNOSIS — I517 Cardiomegaly: Secondary | ICD-10-CM

## 2021-03-27 DIAGNOSIS — R600 Localized edema: Secondary | ICD-10-CM | POA: Insufficient documentation

## 2021-03-27 DIAGNOSIS — N1832 Chronic kidney disease, stage 3b: Secondary | ICD-10-CM

## 2021-03-27 DIAGNOSIS — R079 Chest pain, unspecified: Secondary | ICD-10-CM | POA: Diagnosis not present

## 2021-03-27 MED ORDER — FUROSEMIDE 20 MG PO TABS: 20.0000 mg | ORAL_TABLET | Freq: Every day | ORAL | 3 refills | Status: DC

## 2021-03-27 NOTE — Patient Instructions (Addendum)
Medication Instructions:  Your physician has recommended you make the following change in your medication: START: furosemide (Lasix) 20 mg by mouth once daily  *If you need a refill on your cardiac medications before your next appointment, please call your pharmacy*   Lab Work: IN 7-10 DAYS: BNP  If you have labs (blood work) drawn today and your tests are completely normal, you will receive your results only by: [place] (if you have MyChart) OR A paper copy in the mail If you have any lab test that is abnormal or we need to change your treatment, we will call you to review the results.   Testing/Procedures: Your physician has requested that you have an echocardiogram. Echocardiography is a painless test that uses sound waves to create images of your heart. It provides your doctor with information about the size and shape of your heart and how well your hearts chambers and valves are working. This procedure takes approximately one hour. There are no restrictions for this procedure.  Your physician has requested that you have a lexiscan myoview. For further information please visit [email or web address]. Please follow instruction sheet, as given.     Follow-Up: At [facility], you and your health needs are our priority.  As part of our continuing mission to provide you with exceptional heart care, we have created designated Provider Care Teams.  These Care Teams include your primary Cardiologist (physician) and Advanced Practice Providers (APPs -  Physician Assistants and Nurse Practitioners) who all work together to provide you with the care you need, when you need it.  We recommend signing up for the patient portal called "MyChart".  Sign up information is provided on this After Visit Summary.  MyChart is used to connect with patients for Virtual Visits (Telemedicine).  Patients are able to view lab/test results, encounter notes, upcoming appointments, etc.  Non-urgent messages  can be sent to your provider as well.   To learn more about what you can do with MyChart, go to [email or web address].    Your next appointment:   3 - 4  month(s)  The format for your next appointment:   In Person  Provider:   Werner Lean, MD     Other Instructions  You are scheduled for a Myocardial Perfusion Imaging Study. Please arrive 15 minutes prior to your appointment time for registration and insurance purposes.   The test will take approximately 3 to 4 hours to complete; you may bring reading material.  If someone comes with you to your appointment, they will need to remain in the main lobby due to limited space in the testing area. **If you are pregnant or breastfeeding, please notify the nuclear lab prior to your appointment**   How to prepare for your Myocardial Perfusion Test: Do not eat or drink 3 hours prior to your test, except you may have water. Do not consume products containing caffeine (regular or decaffeinated) 12 hours prior to your test. (ex: coffee, chocolate, sodas, tea). Do bring a list of your current medications with you.  If not listed below, you may take your medications as normal. Do wear comfortable clothes (no dresses or overalls) and walking shoes, tennis shoes preferred (No heels or open toe shoes are allowed). Do NOT wear cologne, perfume, aftershave, or lotions (deodorant is allowed). If these instructions are not followed, your test will have to be rescheduled.  If you cannot keep your appointment, please provide 24 hours notification to the Nuclear Lab, to avoid a possible $50  charge to your account.

## 2021-03-27 NOTE — Progress Notes (Signed)
Cardiology Office Note:    Date:  03/27/2021   ID:  Emma [name], DOB 1955-05-11, MRN [id number]  PCP:  Carlena Hurl, PA-C   [facility] Providers Cardiologist:  Werner Lean, MD     Referring MD: Carlena Hurl, PA-C   CC:  Chest heaviness  Consulted for the evaluation of idney disease at the behest of Carlena Hurl, PA-C   History of Present Illness:    Emma [name] is a 66 y.o. female with a hx of HTN with DM, LVH, who presents for evaluation 03/27/21.  Patient notes that she is feeling terrible.  Dealing with a lot of stress- lost her father two years ago, brother died 1.5 years (Covid-19), Husband this summer.  With thinking about this, she gets chest pressure.  Has had intermittent chest heaviness for as long as she can remember which she relates to a stressful life.   Discomfort occurs with thinking about loved ones and does not, worsens with activity.  Patient exertion notable for doing exercise bike with  and feels no symptoms.  No shortness of breath, DOE .  No PND or orthopnea.  No weight gain, leg swelling , or abdominal swelling.  No syncope or near syncope . Notes palpitations that are nocturnal in nature.  Had 3-4 times last week- only cause mild discomfort.   Ambulatory BP 146/77.   Past Medical History:  Diagnosis Date   Chronic constipation    Cluster headache syndrome 04/27/13   Colon polyp 06/11/2011   Most recent 04/23/08, no polyp--mild diverticulosis.  Repeat 10 yrs.   Depression with anxiety 02/22/2008   Qualifier: Diagnosis of  By: Nolon Rod CMA (AAMA), Robin     Diabetes mellitus without complication ([place])    Diverticulosis    GERD (gastroesophageal reflux disease) 06/11/2011   Hypertension    Recently diagnosed   Migraine syndrome    Osteopenia 11/2016   bone density. plan repeat in 11/2018   Overweight(278.02) 06/11/2011   PUD (peptic ulcer disease) 04/23/08   Multiple antrum ulcers; h pylori neg   Rheumatoid arthritis(714.0)      Past Surgical History:  Procedure Laterality Date   ABDOMINAL HYSTERECTOMY     APPENDECTOMY     BACK SURGERY     3 times   CARDIOVASCULAR STRESS TEST  06/22/2011   Normal; EF 65%   CHOLECYSTECTOMY     COLONOSCOPY  04/23/2008   mild diverticulosis, internal hemorrhoids; Dr. Erskine Emery   ENDOVENOUS ABLATION SAPHENOUS VEIN W/ LASER Left 06/07/2019   endovenous laser ablation left greater saphenous vein and stab phlebectomy < 10 incisions left leg by Gae Gallop MD   ENDOVENOUS ABLATION SAPHENOUS VEIN W/ LASER Right 12/27/2019   LIVER BIOPSY  03/2007   Chronic active hepatitis w/fibrosis--related to hx of methotrexate treatment.   oral implants  2022   SKIN BIOPSY  08/16/2017   superficial basal cell carcinoma left inferior medial anterior tibia     Current Medications: Current Meds  Medication Sig   acetaminophen (TYLENOL) 500 MG tablet Take 1,500 mg by mouth once as needed for mild pain or moderate pain.    BIOTIN PO Take 1 capsule by mouth daily.    calcium carbonate ([employer] CALCIUM) 1500 (600 Ca) MG TABS tablet TAKE 1 TABLET (600 MG TOTAL) BY MOUTH 2 (TWO) TIMES DAILY WITH A MEAL.   carisoprodol (SOMA) 350 MG tablet TAKE 1 TABLET BY MOUTH TWICE A DAY AND AT BEDTIME AS NEEDED FOR MUSCLE SPASMS  dicyclomine (BENTYL) 10 MG capsule Take 1-2 capsules (10-20 mg total) by mouth every 8 (eight) hours as needed for spasms.   Docusate Calcium (STOOL SOFTENER PO) Take 1 capsule by mouth as needed.   furosemide (LASIX) 20 MG tablet Take 1 tablet (20 mg total) by mouth daily.   gabapentin (NEURONTIN) 300 MG capsule TAKE 1 CAPSULE BY MOUTH THREE TIMES A DAY AS NEEDED   hydroxychloroquine (PLAQUENIL) 200 MG tablet Take 400 mg by mouth daily.   lisinopril (ZESTRIL) 20 MG tablet Take 1 tablet (20 mg total) by mouth daily.   metFORMIN (GLUCOPHAGE) 500 MG tablet TAKE 1 TABLET BY MOUTH EVERY DAY WITH BREAKFAST   methylcellulose (CITRUCEL) oral powder Take daily   mirtazapine (REMERON) 15 MG  tablet TAKE 1/2 OR 1 TABLET BY MOUTH AT BEDTIME   Multiple Vitamins-Iron (MULTIVITAMINS WITH IRON) TABS tablet Take 1 tablet by mouth daily.   omeprazole (PRILOSEC) 40 MG capsule TAKE 1 CAPSULE BY MOUTH EVERY DAY   ondansetron (ZOFRAN-ODT) 4 MG disintegrating tablet Take 1 tablet (4 mg total) by mouth every 6 (six) hours as needed for nausea or vomiting.   rosuvastatin (CRESTOR) 20 MG tablet Take 1 tablet (20 mg total) by mouth daily.   verapamil (CALAN-SR) 240 MG CR tablet TAKE 1 TABLET BY MOUTH EVERYDAY AT BEDTIME     Allergies:   Bee venom   Social History   Socioeconomic History   Marital status: Married    Spouse name: Not on file   Number of children: 1   Years of education: Not on file   Highest education level: Not on file  Occupational History   Occupation: retired on disability  Tobacco Use   Smoking status: Never   Smokeless tobacco: Never  Vaping Use   Vaping Use: Never used  Substance and Sexual Activity   Alcohol use: Not Currently   Drug use: No   Sexual activity: Not Currently    Partners: Male  Other Topics Concern   Not on file  Social History Narrative   Married, caregiver, limited exercise.   01/2018.   Social Determinants of Health   Financial Resource Strain: Not on file  Food Insecurity: Not on file  Transportation Needs: Not on file  Physical Activity: Not on file  Stress: Not on file  Social Connections: Not on file    Social: Has lost a lost of people I the past two years.  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  Family History: The patient's family history includes Alcohol abuse in her sister; Arthritis in her father; Atrial fibrillation in her father; Cancer in her sister; Colon cancer in her paternal uncle; Diabetes in her mother; Heart disease in her maternal grandfather, maternal grandmother, and sister; Hypertension in her brother, father, and sister; Polycystic ovary syndrome in her daughter; Pulmonary embolism in her sister; Stroke in her mother. There is no  history of Esophageal cancer or Stomach cancer. Sister had MI related to illegal substance Father had PPM  ROS:   Please see the history of present illness.     All other systems reviewed and are negative.  EKGs/Labs/Other Studies Reviewed:    The following studies were reviewed today:  EKG:  EKG is  ordered today.  The ekg ordered today demonstrates  03/27/21: SR rate 63 WNL  Transthoracic Echocardiogram: Date: 01/23/14 Results: - Left ventricle: The cavity size was normal. Wall thickness was    increased in a pattern of moderate LVH. Systolic function was    normal. The estimated ejection  fraction was in the range of 55%    to 60%.   Recent Labs: 02/19/2021: ALT 16; BUN 15; Creatinine, Ser 1.22; Hemoglobin 12.7; Platelets 275; Potassium 4.5; Sodium 144  Recent Lipid Panel    Component Value Date/Time   CHOL 116 02/19/2021 1219   TRIG 104 02/19/2021 1219   HDL 59 02/19/2021 1219   CHOLHDL 2.0 02/19/2021 1219   CHOLHDL 4.0 12/16/2016 1244   VLDL 36 (H) 07/17/2015 0001   LDLCALC 38 02/19/2021 1219   LDLCALC 94 12/16/2016 1244    Physical Exam:    VS:  BP (!) 146/77    Pulse 63    Ht 5\' 7"  (1.702 m)    Wt 108.4 kg    SpO2 96%    BMI 37.43 kg/m     Wt Readings from Last 3 Encounters:  03/27/21 108.4 kg  02/19/21 105.9 kg  02/16/21 104.8 kg    Gen: No distress, Morbid obesity   Neck: No JVD,  Cardiac: No Rubs or Gallops, no Murmur, regular rhythm +2 radial pulses Respiratory: Clear to auscultation bilaterally, normal effort, normal  respiratory rate GI: Soft, nontender, non-distended  MS: +1 bilateral LE edema;  moves all extremities Integument: Skin feels warm Neuro:  At time of evaluation, alert and oriented to person/place/time/situation  Psych: Depressed mood and affect   ASSESSMENT:    1. Chest pain of uncertain etiology   2. Stage 3b chronic kidney disease ([place])   3. Bilateral lower extremity edema   4. LVH (left ventricular hypertrophy)    PLAN:     Chest pain HTN with DM Mixed HLD CKD IIIb LE edema - given CAD risk factors and prior failed stress test (could not walk on treadmill), will get Lexiscan - new LE edema and former LVH (HTN related suspected) will repeat Echo  - starting lasix 20 mg PO daily and getting labs in 1-2 weeks f/u  3-4 month f/u me        Medication Adjustments/Labs and Tests Ordered: Current medicines are reviewed at length with the patient today.  Concerns regarding medicines are outlined above.  Orders Placed This Encounter  Procedures   Pro b natriuretic peptide (BNP)   MYOCARDIAL PERFUSION IMAGING   EKG 12-Lead   ECHOCARDIOGRAM COMPLETE   Meds ordered this encounter  Medications   furosemide (LASIX) 20 MG tablet    Sig: Take 1 tablet (20 mg total) by mouth daily.    Dispense:  90 tablet    Refill:  3    Patient Instructions  Medication Instructions:  Your physician has recommended you make the following change in your medication: START: furosemide (Lasix) 20 mg by mouth once daily  *If you need a refill on your cardiac medications before your next appointment, please call your pharmacy*   Lab Work: IN 7-10 DAYS: BNP  If you have labs (blood work) drawn today and your tests are completely normal, you will receive your results only by: Cut Off (if you have MyChart) OR A paper copy in the mail If you have any lab test that is abnormal or we need to change your treatment, we will call you to review the results.   Testing/Procedures: Your physician has requested that you have an echocardiogram. Echocardiography is a painless test that uses sound waves to create images of your heart. It provides your doctor with information about the size and shape of your heart and how well your hearts chambers and valves are working. This procedure takes approximately  one hour. There are no restrictions for this procedure.  Your physician has requested that you have a lexiscan myoview. For  further information please visit [email or web address]. Please follow instruction sheet, as given.     Follow-Up: At [facility], you and your health needs are our priority.  As part of our continuing mission to provide you with exceptional heart care, we have created designated Provider Care Teams.  These Care Teams include your primary Cardiologist (physician) and Advanced Practice Providers (APPs -  Physician Assistants and Nurse Practitioners) who all work together to provide you with the care you need, when you need it.  We recommend signing up for the patient portal called "MyChart".  Sign up information is provided on this After Visit Summary.  MyChart is used to connect with patients for Virtual Visits (Telemedicine).  Patients are able to view lab/test results, encounter notes, upcoming appointments, etc.  Non-urgent messages can be sent to your provider as well.   To learn more about what you can do with MyChart, go to [email or web address].    Your next appointment:   3 - 4  month(s)  The format for your next appointment:   In Person  Provider:   Werner Lean, MD     Other Instructions  You are scheduled for a Myocardial Perfusion Imaging Study. Please arrive 15 minutes prior to your appointment time for registration and insurance purposes.   The test will take approximately 3 to 4 hours to complete; you may bring reading material.  If someone comes with you to your appointment, they will need to remain in the main lobby due to limited space in the testing area. **If you are pregnant or breastfeeding, please notify the nuclear lab prior to your appointment**   How to prepare for your Myocardial Perfusion Test: Do not eat or drink 3 hours prior to your test, except you may have water. Do not consume products containing caffeine (regular or decaffeinated) 12 hours prior to your test. (ex: coffee, chocolate, sodas, tea). Do bring a list of your current medications  with you.  If not listed below, you may take your medications as normal. Do wear comfortable clothes (no dresses or overalls) and walking shoes, tennis shoes preferred (No heels or open toe shoes are allowed). Do NOT wear cologne, perfume, aftershave, or lotions (deodorant is allowed). If these instructions are not followed, your test will have to be rescheduled.  If you cannot keep your appointment, please provide 24 hours notification to the Nuclear Lab, to avoid a possible $50 charge to your account.        Signed, Werner Lean, MD  03/27/2021 5:22 PM     [facility]

## 2021-03-31 ENCOUNTER — Ambulatory Visit (HOSPITAL_COMMUNITY)
Admission: RE | Admit: 2021-03-31 | Discharge: 2021-03-31 | Disposition: A | Discharge status: Home / Self Care | Payer: Medicare Other | Source: Ambulatory Visit | Attending: Nephrology | Admitting: Nephrology

## 2021-03-31 ENCOUNTER — Other Ambulatory Visit: Payer: Self-pay

## 2021-03-31 DIAGNOSIS — E1129 Type 2 diabetes mellitus with other diabetic kidney complication: Secondary | ICD-10-CM | POA: Diagnosis present

## 2021-03-31 DIAGNOSIS — R809 Proteinuria, unspecified: Secondary | ICD-10-CM | POA: Diagnosis present

## 2021-03-31 DIAGNOSIS — E1122 Type 2 diabetes mellitus with diabetic chronic kidney disease: Secondary | ICD-10-CM | POA: Insufficient documentation

## 2021-03-31 DIAGNOSIS — I129 Hypertensive chronic kidney disease with stage 1 through stage 4 chronic kidney disease, or unspecified chronic kidney disease: Secondary | ICD-10-CM | POA: Insufficient documentation

## 2021-03-31 NOTE — Addendum Note (Signed)
Addended by: Rudean Haskell A on: 03/31/2021 08:20 AM   Modules accepted: Orders

## 2021-04-07 ENCOUNTER — Other Ambulatory Visit (HOSPITAL_COMMUNITY): Payer: Self-pay | Admitting: Nephrology

## 2021-04-07 ENCOUNTER — Other Ambulatory Visit: Payer: Self-pay | Admitting: Nephrology

## 2021-04-07 DIAGNOSIS — I129 Hypertensive chronic kidney disease with stage 1 through stage 4 chronic kidney disease, or unspecified chronic kidney disease: Secondary | ICD-10-CM

## 2021-04-07 DIAGNOSIS — N281 Cyst of kidney, acquired: Secondary | ICD-10-CM

## 2021-04-07 DIAGNOSIS — E1122 Type 2 diabetes mellitus with diabetic chronic kidney disease: Secondary | ICD-10-CM

## 2021-04-07 DIAGNOSIS — E1129 Type 2 diabetes mellitus with other diabetic kidney complication: Secondary | ICD-10-CM

## 2021-04-07 DIAGNOSIS — R809 Proteinuria, unspecified: Secondary | ICD-10-CM

## 2021-04-08 ENCOUNTER — Telehealth (HOSPITAL_COMMUNITY): Payer: Self-pay | Admitting: *Deleted

## 2021-04-08 NOTE — Telephone Encounter (Signed)
Left message on voicemail in reference to upcoming appointment scheduled for 04/13/21. Phone number given for a call back so details instructions can be given. Emma [name]

## 2021-04-13 ENCOUNTER — Ambulatory Visit (HOSPITAL_COMMUNITY): Payer: Medicare Other | Attending: Cardiology

## 2021-04-13 ENCOUNTER — Telehealth: Payer: Self-pay | Admitting: Medical

## 2021-04-13 ENCOUNTER — Other Ambulatory Visit: Payer: Self-pay

## 2021-04-13 ENCOUNTER — Other Ambulatory Visit: Payer: Self-pay | Admitting: Medical

## 2021-04-13 DIAGNOSIS — I517 Cardiomegaly: Secondary | ICD-10-CM | POA: Diagnosis not present

## 2021-04-13 DIAGNOSIS — R600 Localized edema: Secondary | ICD-10-CM | POA: Insufficient documentation

## 2021-04-13 DIAGNOSIS — R079 Chest pain, unspecified: Secondary | ICD-10-CM | POA: Insufficient documentation

## 2021-04-13 DIAGNOSIS — N1832 Chronic kidney disease, stage 3b: Secondary | ICD-10-CM | POA: Diagnosis not present

## 2021-04-13 MED ORDER — TECHNETIUM TC 99M TETROFOSMIN IV KIT
31.3000 | PACK | Freq: Once | INTRAVENOUS | Status: AC | PRN
  Administered 2021-04-13: 31.3 via INTRAVENOUS
  Filled 2021-04-13: qty 32

## 2021-04-13 MED ORDER — REGADENOSON 0.4 MG/5ML IV SOLN
0.4000 mg | Freq: Once | INTRAVENOUS | Status: AC
  Administered 2021-04-13: 0.4 mg via INTRAVENOUS

## 2021-04-13 NOTE — Telephone Encounter (Signed)
Error

## 2021-04-14 ENCOUNTER — Ambulatory Visit (HOSPITAL_COMMUNITY): Payer: Medicare Other

## 2021-04-14 ENCOUNTER — Ambulatory Visit (HOSPITAL_BASED_OUTPATIENT_CLINIC_OR_DEPARTMENT_OTHER): Payer: Medicare Other

## 2021-04-14 DIAGNOSIS — R600 Localized edema: Secondary | ICD-10-CM | POA: Diagnosis not present

## 2021-04-14 DIAGNOSIS — R079 Chest pain, unspecified: Secondary | ICD-10-CM | POA: Diagnosis not present

## 2021-04-14 DIAGNOSIS — N1832 Chronic kidney disease, stage 3b: Secondary | ICD-10-CM

## 2021-04-14 DIAGNOSIS — I517 Cardiomegaly: Secondary | ICD-10-CM | POA: Diagnosis not present

## 2021-04-14 LAB — ECHOCARDIOGRAM COMPLETE
AR max vel: 1.83 cm2
AV Area VTI: 1.89 cm2
AV Area mean vel: 1.84 cm2
AV Mean grad: 8 mmHg
AV Peak grad: 17 mmHg
Ao pk vel: 2.06 m/s
Area-P 1/2: 3.97 cm2
S' Lateral: 3.3 cm

## 2021-04-14 LAB — MYOCARDIAL PERFUSION IMAGING
LV dias vol: 74 mL (ref 46–106)
LV sys vol: 25 mL
Nuc Stress EF: 67 %
Peak HR: 74 {beats}/min
Rest HR: 54 {beats}/min
SDS: 0
SRS: 0
SSS: 0
ST Depression (mm): 0 mm
Stress Nuclear Isotope Dose: 31.3 mCi
TID: 1.28

## 2021-04-14 MED ORDER — TECHNETIUM TC 99M TETROFOSMIN IV KIT
32.3000 | PACK | Freq: Once | INTRAVENOUS | Status: AC | PRN
  Administered 2021-04-14: 32.3 via INTRAVENOUS
  Filled 2021-04-14: qty 33

## 2021-04-15 ENCOUNTER — Telehealth: Payer: Self-pay

## 2021-04-15 DIAGNOSIS — N1832 Chronic kidney disease, stage 3b: Secondary | ICD-10-CM

## 2021-04-15 DIAGNOSIS — I371 Nonrheumatic pulmonary valve insufficiency: Secondary | ICD-10-CM

## 2021-04-15 DIAGNOSIS — I517 Cardiomegaly: Secondary | ICD-10-CM

## 2021-04-15 NOTE — Telephone Encounter (Signed)
Reviewed results and MD recommendations. Pt expresses Kidney MRI is scheduled for 04/20/21 at [facility] hospital.  Pt would like to have both test done on same day.  I have called AP to f/u on pt request.  [facility] staff member that schedules Cardiac MRI did not answer was given her direct extension to call back at a later time [phone].  Dr. Gasper Sells advised that pt can not have both test at the same time. Called pt notified of this information reviewed instructions for Cardiac MRI; mailed to pt.   Orders placed.

## 2021-04-20 ENCOUNTER — Other Ambulatory Visit: Payer: Self-pay

## 2021-04-20 ENCOUNTER — Ambulatory Visit (HOSPITAL_COMMUNITY)
Admission: RE | Admit: 2021-04-20 | Discharge: 2021-04-20 | Disposition: A | Discharge status: Home / Self Care | Payer: Medicare Other | Source: Ambulatory Visit | Attending: Nephrology | Admitting: Nephrology

## 2021-04-20 DIAGNOSIS — N281 Cyst of kidney, acquired: Secondary | ICD-10-CM | POA: Insufficient documentation

## 2021-04-20 DIAGNOSIS — I129 Hypertensive chronic kidney disease with stage 1 through stage 4 chronic kidney disease, or unspecified chronic kidney disease: Secondary | ICD-10-CM | POA: Insufficient documentation

## 2021-04-20 DIAGNOSIS — E1122 Type 2 diabetes mellitus with diabetic chronic kidney disease: Secondary | ICD-10-CM | POA: Insufficient documentation

## 2021-04-20 DIAGNOSIS — E1129 Type 2 diabetes mellitus with other diabetic kidney complication: Secondary | ICD-10-CM | POA: Diagnosis present

## 2021-04-20 DIAGNOSIS — R809 Proteinuria, unspecified: Secondary | ICD-10-CM | POA: Insufficient documentation

## 2021-04-20 MED ORDER — GADOBUTROL 1 MMOL/ML IV SOLN
10.0000 mL | Freq: Once | INTRAVENOUS | Status: AC | PRN
  Administered 2021-04-20: 10 mL via INTRAVENOUS

## 2021-04-26 ENCOUNTER — Emergency Department (HOSPITAL_COMMUNITY): Payer: Medicare Other

## 2021-04-26 ENCOUNTER — Other Ambulatory Visit: Payer: Self-pay

## 2021-04-26 ENCOUNTER — Emergency Department (HOSPITAL_COMMUNITY)
Admission: EM | Admit: 2021-04-26 | Discharge: 2021-04-26 | Disposition: A | Discharge status: Home / Self Care | Payer: Medicare Other | Attending: Emergency Medicine | Admitting: Emergency Medicine

## 2021-04-26 ENCOUNTER — Encounter (HOSPITAL_COMMUNITY): Payer: Self-pay | Admitting: Emergency Medicine

## 2021-04-26 DIAGNOSIS — R079 Chest pain, unspecified: Secondary | ICD-10-CM | POA: Diagnosis present

## 2021-04-26 DIAGNOSIS — M79604 Pain in right leg: Secondary | ICD-10-CM | POA: Insufficient documentation

## 2021-04-26 DIAGNOSIS — R0602 Shortness of breath: Secondary | ICD-10-CM | POA: Diagnosis not present

## 2021-04-26 DIAGNOSIS — I10 Essential (primary) hypertension: Secondary | ICD-10-CM | POA: Insufficient documentation

## 2021-04-26 DIAGNOSIS — E119 Type 2 diabetes mellitus without complications: Secondary | ICD-10-CM | POA: Insufficient documentation

## 2021-04-26 LAB — CBC
HCT: 37.6 % (ref 36.0–46.0)
Hemoglobin: 12.3 g/dL (ref 12.0–15.0)
MCH: 31.3 pg (ref 26.0–34.0)
MCHC: 32.7 g/dL (ref 30.0–36.0)
MCV: 95.7 fL (ref 80.0–100.0)
Platelets: 258 10*3/uL (ref 150–400)
RBC: 3.93 MIL/uL (ref 3.87–5.11)
RDW: 12.4 % (ref 11.5–15.5)
WBC: 8.7 10*3/uL (ref 4.0–10.5)
nRBC: 0 % (ref 0.0–0.2)

## 2021-04-26 LAB — COMPREHENSIVE METABOLIC PANEL
ALT: 18 U/L (ref 0–44)
AST: 19 U/L (ref 15–41)
Albumin: 4.2 g/dL (ref 3.5–5.0)
Alkaline Phosphatase: 82 U/L (ref 38–126)
Anion gap: 10 (ref 5–15)
BUN: 32 mg/dL — ABNORMAL HIGH (ref 8–23)
CO2: 26 mmol/L (ref 22–32)
Calcium: 9.2 mg/dL (ref 8.9–10.3)
Chloride: 103 mmol/L (ref 98–111)
Creatinine, Ser: 1.73 mg/dL — ABNORMAL HIGH (ref 0.44–1.00)
GFR, Estimated: 32 mL/min — ABNORMAL LOW (ref >60)
Glucose, Bld: 164 mg/dL — ABNORMAL HIGH (ref 70–99)
Potassium: 4.1 mmol/L (ref 3.5–5.1)
Sodium: 139 mmol/L (ref 135–145)
Total Bilirubin: 0.3 mg/dL (ref 0.3–1.2)
Total Protein: 7.7 g/dL (ref 6.5–8.1)

## 2021-04-26 LAB — TROPONIN I (HIGH SENSITIVITY)
Troponin I (High Sensitivity): 5 ng/L (ref <18)
Troponin I (High Sensitivity): 8 ng/L (ref <18)

## 2021-04-26 LAB — D-DIMER, QUANTITATIVE: D-Dimer, Quant: 0.47 ug/mL-FEU (ref 0.00–0.50)

## 2021-04-26 MED ORDER — HYDROCODONE-ACETAMINOPHEN 5-325 MG PO TABS
1.0000 | ORAL_TABLET | Freq: Once | ORAL | Status: AC
  Administered 2021-04-26: 1 via ORAL
  Filled 2021-04-26: qty 1

## 2021-04-26 MED ORDER — ASPIRIN 81 MG PO CHEW: 324.0000 mg | CHEWABLE_TABLET | Freq: Once | ORAL | Status: DC

## 2021-04-26 NOTE — ED Notes (Signed)
X-Ray at bedside.

## 2021-04-26 NOTE — ED Triage Notes (Signed)
Pt arrives with [facility] from home c/o chest heaviness all day. Pt also c/o right leg cramping earlier today.

## 2021-04-26 NOTE — Discharge Instructions (Signed)
You were evaluated in the Emergency Department and after careful evaluation, we did not find any emergent condition requiring admission or further testing in the hospital.  Your exam/testing today is overall reassuring.  No signs of heart attack or blood clot.  Recommend close follow-up with your nephrologist.  We discussed your slightly worsening kidney function that we found here in the emergency department.  Please return to the Emergency Department if you experience any worsening of your condition.   Thank you for allowing [place] to be a part of your care.

## 2021-04-26 NOTE — ED Provider Notes (Signed)
[facility] Emergency Department Provider Note MRN:  [id number]  Arrival date & time: 04/26/21     Chief Complaint   Chest Pain   History of Present Illness   Emma [name] is a 66 y.o. year-old female with a history of diabetes presenting to the ED with chief complaint of chest pain.  Patient went to the movie theater to watch Avatar, which is a 3+ hour movie.  She was then driving home in her car and experienced a sudden onset of pain to the right leg.  Nearly had to pull over because of the pain.  She then experienced chest pain and shortness of breath.  The leg symptoms have resolved, continues to have chest pain.  Denies dizziness or diaphoresis, no nausea vomiting, no abdominal pain.  Review of Systems  A thorough review of systems was obtained and all systems are negative except as noted in the HPI and PMH.   Patient's Health History    Past Medical History:  Diagnosis Date   Chronic constipation    Cluster headache syndrome 04/27/13   Colon polyp 06/11/2011   Most recent 04/23/08, no polyp--mild diverticulosis.  Repeat 10 yrs.   Depression with anxiety 02/22/2008   Qualifier: Diagnosis of  By: Nolon Rod CMA (AAMA), Robin     Diabetes mellitus without complication ([place])    Diverticulosis    GERD (gastroesophageal reflux disease) 06/11/2011   Hypertension    Recently diagnosed   Migraine syndrome    Osteopenia 11/2016   bone density. plan repeat in 11/2018   Overweight(278.02) 06/11/2011   PUD (peptic ulcer disease) 04/23/08   Multiple antrum ulcers; h pylori neg   Rheumatoid arthritis(714.0)     Past Surgical History:  Procedure Laterality Date   ABDOMINAL HYSTERECTOMY     APPENDECTOMY     BACK SURGERY     3 times   CARDIOVASCULAR STRESS TEST  06/22/2011   Normal; EF 65%   CHOLECYSTECTOMY     COLONOSCOPY  04/23/2008   mild diverticulosis, internal hemorrhoids; Dr. Erskine Emery   ENDOVENOUS ABLATION SAPHENOUS VEIN W/ LASER Left 06/07/2019    endovenous laser ablation left greater saphenous vein and stab phlebectomy < 10 incisions left leg by Gae Gallop MD   ENDOVENOUS ABLATION SAPHENOUS VEIN W/ LASER Right 12/27/2019   LIVER BIOPSY  03/2007   Chronic active hepatitis w/fibrosis--related to hx of methotrexate treatment.   oral implants  2022   SKIN BIOPSY  08/16/2017   superficial basal cell carcinoma left inferior medial anterior tibia     Family History  Problem Relation Age of Onset   Diabetes Mother    Stroke Mother    Arthritis Father    Hypertension Father    Atrial fibrillation Father    Alcohol abuse Sister    Cancer Sister        mesothelioma   Heart disease Sister        cardiomegaly   Hypertension Sister    Pulmonary embolism Sister    Hypertension Brother    Polycystic ovary syndrome Daughter    Heart disease Maternal Grandmother    Heart disease Maternal Grandfather    Colon cancer Paternal Uncle    Esophageal cancer Neg Hx    Stomach cancer Neg Hx     Social History   Socioeconomic History   Marital status: Married    Spouse name: Not on file   Number of children: 1   Years of education: Not on file  Highest education level: Not on file  Occupational History   Occupation: retired on disability  Tobacco Use   Smoking status: Never   Smokeless tobacco: Never  Vaping Use   Vaping Use: Never used  Substance and Sexual Activity   Alcohol use: Not Currently   Drug use: No   Sexual activity: Not Currently    Partners: Male  Other Topics Concern   Not on file  Social History Narrative   Married, caregiver, limited exercise.   01/2018.   Social Determinants of Health   Financial Resource Strain: Not on file  Food Insecurity: Not on file  Transportation Needs: Not on file  Physical Activity: Not on file  Stress: Not on file  Social Connections: Not on file  Intimate Partner Violence: Not on file     Physical Exam   Vitals:   04/26/21 0330 04/26/21 0530  BP: 133/78 132/74   Pulse: 72 71  Resp: 17 16  Temp:    SpO2: 98% 98%    CONSTITUTIONAL: Well-appearing, NAD NEURO/PSYCH:  Alert and oriented x 3, no focal deficits EYES:  eyes equal and reactive ENT/NECK:  no LAD, no JVD CARDIO: Regular rate, well-perfused, normal S1 and S2 PULM:  CTAB no wheezing or rhonchi GI/GU:  non-distended, non-tender MSK/SPINE:  No gross deformities, no edema SKIN:  no rash, atraumatic   *Additional and/or pertinent findings included in MDM below  Diagnostic and Interventional Summary    EKG Interpretation  Date/Time:  Sunday April 26 2021 02:15:26 EST Ventricular Rate:  72 PR Interval:  153 QRS Duration: 98 QT Interval:  391 QTC Calculation: 428 R Axis:   19 Text Interpretation: Sinus rhythm Confirmed by Gerlene Fee [phone]) on 04/26/2021 2:42:25 AM       Labs Reviewed  COMPREHENSIVE METABOLIC PANEL - Abnormal; Notable for the following components:      Result Value   Glucose, Bld 164 (*)    BUN 32 (*)    Creatinine, Ser 1.73 (*)    GFR, Estimated 32 (*)    All other components within normal limits  CBC  D-DIMER, QUANTITATIVE  TROPONIN I (HIGH SENSITIVITY)  TROPONIN I (HIGH SENSITIVITY)    DG Tibia/Fibula Right  Final Result    DG Femur Min 2 Views Right  Final Result    DG Chest Port 1 View  Final Result      Medications  aspirin chewable tablet 324 mg (324 mg Oral Not Given 04/26/21 0247)  HYDROcodone-acetaminophen (NORCO/VICODIN) 5-325 MG per tablet 1 tablet (1 tablet Oral Given 04/26/21 8338)     Procedures  /  Critical Care Procedures  ED Course and Medical Decision Making  Initial Impression and Ddx Differential diagnosis includes DVT/PE, ACS.  Patient is in no acute distress with normal vital signs at this time.  Symptoms could also be explained by MSK pain, GERD, or other benign process.  Awaiting EKG, chest x-ray, labs.  Will obtain D-dimer given the low risk of PE.  Past medical/surgical history that increases complexity of ED  encounter: CKD  Interpretation of Diagnostics I personally reviewed the EKG and my interpretation is as follows: Sinus rhythm without ischemic concerns    Labs reveal worsening kidney function compared to prior.  Otherwise reassuring.  Troponin negative x2  Patient Reassessment and Ultimate Disposition/Management Patient looks and feels well on reassessment.  Having some return of right leg pain.  D-dimer is negative.  Highly doubt VTE.  Upon further chart review, patient has been following up with  nephrologist for her CKD, she has abnormalities in her of her kappa lambda chains, and so MDS/multiple myeloma is considered.  X-ray screening for pathologic fracture of the leg are reassuring.  Patient is made aware of her worsening kidney function and will follow up with nephrology.  Patient management required discussion with the following services or consulting groups:  None  Complexity of Problems Addressed Acute complicated illness or Injury  Additional Data Reviewed and Analyzed Further history obtained from: Care Everywhere  Factors Impacting ED Encounter Risk Consideration of hospitalization  Barth Kirks. [place] Small, [place] [email or web address]  Final Clinical Impressions(s) / ED Diagnoses     ICD-10-CM   1. Chest pain, unspecified type  R07.9       ED Discharge Orders     None        Discharge Instructions Discussed with and Provided to Patient:     Discharge Instructions      You were evaluated in the Emergency Department and after careful evaluation, we did not find any emergent condition requiring admission or further testing in the hospital.  Your exam/testing today is overall reassuring.  No signs of heart attack or blood clot.  Recommend close follow-up with your nephrologist.  We discussed your slightly worsening kidney function that we found here in the emergency department.  Please return to the Emergency  Department if you experience any worsening of your condition.   Thank you for allowing [place] to be a part of your care.       Maudie Flakes, MD 04/26/21 [phone]

## 2021-05-05 ENCOUNTER — Telehealth: Payer: Self-pay | Admitting: Medical

## 2021-05-05 NOTE — Telephone Encounter (Signed)
Left message for patient to call back and schedule Medicare Annual Wellness Visit (AWV) either virtually or in office. I left my number for patient to call [phone].  Last AWV 07/10/19  please schedule at anytime with health coach  This should be a 45 minute visit.

## 2021-05-05 NOTE — Telephone Encounter (Signed)
Pt called back to the office and said she has a lot going on right now and did not want to schedule an AWV at this time. She said that she will call when she's ready.

## 2021-05-12 ENCOUNTER — Telehealth (HOSPITAL_COMMUNITY): Payer: Self-pay | Admitting: *Deleted

## 2021-05-12 NOTE — Telephone Encounter (Signed)
Reaching out to patient to offer assistance regarding upcoming cardiac imaging study; pt verbalizes understanding of appt date/time, parking situation and where to check in, and verified current allergies; name and call back number provided for further questions should they arise  Cassidee Deats RN Navigator Cardiac Imaging [facility] [phone] office [phone] cell  Patient denies claustrophobia and metal. 

## 2021-05-14 ENCOUNTER — Other Ambulatory Visit: Payer: Self-pay

## 2021-05-14 ENCOUNTER — Ambulatory Visit (HOSPITAL_COMMUNITY)
Admission: RE | Admit: 2021-05-14 | Discharge: 2021-05-14 | Disposition: A | Discharge status: Home / Self Care | Payer: Medicare Other | Source: Ambulatory Visit | Attending: Internal Medicine | Admitting: Internal Medicine

## 2021-05-14 DIAGNOSIS — I517 Cardiomegaly: Secondary | ICD-10-CM | POA: Diagnosis present

## 2021-05-14 DIAGNOSIS — I371 Nonrheumatic pulmonary valve insufficiency: Secondary | ICD-10-CM | POA: Diagnosis present

## 2021-05-14 MED ORDER — GADOBUTROL 1 MMOL/ML IV SOLN
10.0000 mL | Freq: Once | INTRAVENOUS | Status: AC | PRN
  Administered 2021-05-14: 10 mL via INTRAVENOUS

## 2021-05-26 ENCOUNTER — Telehealth: Payer: Self-pay | Admitting: Medical

## 2021-05-26 NOTE — Telephone Encounter (Signed)
Spoke with patient to schedule Medicare Annual Wellness Visit (AWV) either virtually or in office. ?She stated she would call back to schedule  ? ?Last AWV 07/10/19 ? please schedule at anytime with health coach ? ?This should be a 45 minute visit.   ?

## 2021-06-22 ENCOUNTER — Other Ambulatory Visit: Payer: Self-pay | Admitting: Medical

## 2021-06-23 ENCOUNTER — Other Ambulatory Visit: Payer: Self-pay | Admitting: Medical

## 2021-07-03 ENCOUNTER — Telehealth (INDEPENDENT_AMBULATORY_CARE_PROVIDER_SITE_OTHER): Payer: Medicare Other | Admitting: Physician Assistant

## 2021-07-03 ENCOUNTER — Encounter: Payer: Self-pay | Admitting: Physician Assistant

## 2021-07-03 VITALS — Ht 67.0 in | Wt 233.0 lb

## 2021-07-03 DIAGNOSIS — L282 Other prurigo: Secondary | ICD-10-CM

## 2021-07-03 DIAGNOSIS — L255 Unspecified contact dermatitis due to plants, except food: Secondary | ICD-10-CM

## 2021-07-03 MED ORDER — METHYLPREDNISOLONE 4 MG PO TBPK: ORAL_TABLET | ORAL | 0 refills | Status: DC

## 2021-07-03 NOTE — Patient Instructions (Signed)
You can take an over the counter antihistamine to help with allergic rhinitis / itching / hives: NON-DROWSY Allegra (Fexofenadine) 180 mg daily or NON-Drowsy Claritin (Loratidine) 10 mg daily  or DROWSY Benadryl (Diphenhydramine) 25 mg as directed or Zyrtec (Cetirizine) 10 mg daily. You can go to a store with a pharmacy and ask them to help you find these medicines. ? ?

## 2021-07-03 NOTE — Progress Notes (Signed)
Start time: 11:25 am ?End time: 11:40 am ? ?Virtual Visit via Video Note ? ? Patient ID: Emma [name], female    DOB: [birth year], 66 y.o.   MRN: [id number] ? ?I connected with above patient on 07/03/21 by a video enabled telemedicine application and verified that I am speaking with the correct person using two identifiers. ? ?Location: ?Patient: home ?Provider: office ?  ?I discussed the limitations of evaluation and management by telemedicine and the availability of in person appointments. The patient expressed understanding and agreed to proceed. ? ?History of Present Illness: ? ?Chief Complaint  ?Patient presents with  ? Acute Visit  ?  Virtual- Possible poison ivy starting to spread all over. She states she gets it almost every year.  ? ?Reports she was outside her house 1 week ago cleaning out a barn; states she get poison ivy or poison oak every year and the red, itchy rash started about 5 days ago; otc Calamine lotion helps; states the rash is on her torso, arms, back, neck, and legs; denies fever or wheezing. ? ?  ?Observations/Objective: ? ?Ht '5\' 7"'$  (1.702 m)   Wt 233 lb (105.7 kg)   BMI 36.49 kg/m?  ? ? ?Assessment: ?Encounter Diagnoses  ?Name Primary?  ? Pruritic rash Yes  ? Rhus dermatitis   ? ? ? ?Plan: ?Medrol dosepak ?You can take an over the counter antihistamine to help with allergic rhinitis / itching / hives: NON-DROWSY Allegra (Fexofenadine) 180 mg daily or NON-Drowsy Claritin (Loratidine) 10 mg daily  or DROWSY Benadryl (Diphenhydramine) 25 mg as directed or Zyrtec (Cetirizine) 10 mg daily. You can go to a store with a pharmacy and ask them to help you find these medicines. ?Avoid poison ivy, poison oak ? ?Emma [name] was seen today for acute visit. ? ?Diagnoses and all orders for this visit: ? ?Pruritic rash ? ?Rhus dermatitis ? ?Other orders ?-     methylPREDNISolone (MEDROL DOSEPAK) 4 MG TBPK tablet; Take tapering dose pack as directed ? ? ? ?Follow up: in 3 months for annual exam with PCP  [place] ? ? ?I discussed the assessment and treatment plan with the patient. The patient was provided an opportunity to ask questions and all were answered. The patient agreed with the plan and demonstrated an understanding of the instructions. ?  ?The patient was advised to call back or seek an in-person evaluation if the symptoms worsen or if the condition fails to improve as anticipated. For emergencies go to Urgent Care or the Emergency Department for immediate evaluation.  ? ?I spent 15 minutes dedicated to the care of this patient, including pre-visit review of records, face to face time, post-visit ordering of testing and documentation. ? ? ? ?Irene Pap, PA-C ?

## 2021-07-06 ENCOUNTER — Telehealth: Payer: Self-pay | Admitting: Medical

## 2021-07-06 NOTE — Telephone Encounter (Signed)
Left message for patient to call back and schedule Medicare Annual Wellness Visit (AWV) either virtually or in office. ?I left my number for patient to call [phone]. ? ?Last AWV ;07/10/19 ? please schedule at anytime with health coach ? ?   ?

## 2021-07-23 ENCOUNTER — Telehealth: Payer: Self-pay | Admitting: Medical

## 2021-07-23 NOTE — Telephone Encounter (Signed)
Left message for patient to call back and schedule Medicare Annual Wellness Visit (AWV) either virtually or in office. ?I left my number for patient to call [phone]. ? ?Last awv 07/10/19 ? please schedule at anytime with health coach ?  ?

## 2021-07-24 NOTE — Telephone Encounter (Signed)
Documented

## 2021-08-01 NOTE — Progress Notes (Signed)
?Cardiology Office Note:   ? ?Date:  08/03/2021  ? ?ID:  Emma [name], DOB [birth year], MRN [id number] ? ?PCP:  Carlena Hurl, PA-C ?  ?[facility] Providers ?Cardiologist:  Werner Lean, MD    ? ?Referring MD: Carlena Hurl, PA-C  ? ?CC:  Chest heaviness follow up ? ?History of Present Illness:   ? ?Emma [name] is a 66 y.o. female with a hx of HTN with DM, LVH, who presents for evaluation 03/27/21.  Had Normal Lexiscan, PR concerns on echo but normal on CMR.Seen 08/01/21. ? ?Patient notes that she is doing similar to prior.   ?Since last visit she has been dealing with kidney issues.  She started SGLT2i then had AKI, this was held.  Her nephrologist has been managing her care.  Creatinine 1.7. ?She is drinking more water, loosing some weight and working out more; the fear of needing dialysis is her motivation. ? ?There are no interval hospital/ED visit.   ? ?No chest pain or pressure .  No SOB/DOE and no PND/Orthopnea.  No weight gain.  No palpitations or syncope . ? ?She has had new leg claudication.  She has rest pain at night.  Worsening pain with walking that keeps her from exercise.  She also have new leg swelling, She has prior laser ablation of thr R G saphenous veins for venous insufficiency, but this feels different. ? ? ?Past Medical History:  ?Diagnosis Date  ? Chronic constipation   ? Cluster headache syndrome 04/27/13  ? Colon polyp 06/11/2011  ? Most recent 04/23/08, no polyp--mild diverticulosis.  Repeat 10 yrs.  ? Depression with anxiety 02/22/2008  ? Qualifier: Diagnosis of  By: Nolon Rod CMA Deborra Medina), Robin    ? Diabetes mellitus without complication ([place])   ? Diverticulosis   ? GERD (gastroesophageal reflux disease) 06/11/2011  ? Hypertension   ? Recently diagnosed  ? Migraine syndrome   ? Osteopenia 11/2016  ? bone density. plan repeat in 11/2018  ? Overweight(278.02) 06/11/2011  ? PUD (peptic ulcer disease) 04/23/08  ? Multiple antrum ulcers; h pylori neg  ? Rheumatoid arthritis(714.0)    ? ? ?Past Surgical History:  ?Procedure Laterality Date  ? ABDOMINAL HYSTERECTOMY    ? APPENDECTOMY    ? BACK SURGERY    ? 3 times  ? CARDIOVASCULAR STRESS TEST  06/22/2011  ? Normal; EF 65%  ? CHOLECYSTECTOMY    ? COLONOSCOPY  04/23/2008  ? mild diverticulosis, internal hemorrhoids; Dr. Erskine Emery  ? ENDOVENOUS ABLATION SAPHENOUS VEIN W/ LASER Left 06/07/2019  ? endovenous laser ablation left greater saphenous vein and stab phlebectomy < 10 incisions left leg by Gae Gallop MD  ? ENDOVENOUS ABLATION SAPHENOUS VEIN W/ LASER Right 12/27/2019  ? LIVER BIOPSY  03/2007  ? Chronic active hepatitis w/fibrosis--related to hx of methotrexate treatment.  ? oral implants  2022  ? SKIN BIOPSY  08/16/2017  ? superficial basal cell carcinoma left inferior medial anterior tibia   ? ? ?Current Medications: ?Current Meds  ?Medication Sig  ? acetaminophen (TYLENOL) 500 MG tablet Take 1,500 mg by mouth once as needed for mild pain or moderate pain.   ? BIOTIN PO Take 1 capsule by mouth daily.   ? calcitRIOL (ROCALTROL) 0.25 MCG capsule Take 0.25 mcg by mouth 3 (three) times a week.  ? calcium carbonate ([facility] CALCIUM) 1500 (600 Ca) MG TABS tablet TAKE 1 TABLET (600 MG TOTAL) BY MOUTH 2 (TWO) TIMES DAILY WITH A MEAL.  ? carisoprodol (  SOMA) 350 MG tablet TAKE 1 TABLET BY MOUTH TWICE A DAY AND AT BEDTIME AS NEEDED FOR MUSCLE SPASMS  ? dicyclomine (BENTYL) 10 MG capsule Take 1-2 capsules (10-20 mg total) by mouth every 8 (eight) hours as needed for spasms.  ? Docusate Calcium (STOOL SOFTENER PO) Take 1 capsule by mouth as needed.  ? furosemide (LASIX) 20 MG tablet Take 1 tablet (20 mg total) by mouth daily.  ? gabapentin (NEURONTIN) 300 MG capsule TAKE 1 CAPSULE BY MOUTH THREE TIMES A DAY AS NEEDED  ? lisinopril (ZESTRIL) 20 MG tablet Take 1 tablet (20 mg total) by mouth daily.  ? metFORMIN (GLUCOPHAGE) 500 MG tablet TAKE 1 TABLET BY MOUTH EVERY DAY WITH BREAKFAST  ? mirtazapine (REMERON) 15 MG tablet TAKE 1/2 OR 1 TABLET BY MOUTH  AT BEDTIME  ? Multiple Vitamins-Iron (MULTIVITAMINS WITH IRON) TABS tablet Take 1 tablet by mouth daily.  ? omeprazole (PRILOSEC) 40 MG capsule TAKE 1 CAPSULE BY MOUTH EVERY DAY  ? rosuvastatin (CRESTOR) 20 MG tablet Take 1 tablet (20 mg total) by mouth daily.  ? verapamil (CALAN-SR) 240 MG CR tablet TAKE 1 TABLET BY MOUTH EVERYDAY AT BEDTIME  ? [DISCONTINUED] hydroxychloroquine (PLAQUENIL) 200 MG tablet Take 400 mg by mouth daily.  ? [DISCONTINUED] methylcellulose (CITRUCEL) oral powder Take daily  ? [DISCONTINUED] methylPREDNISolone (MEDROL DOSEPAK) 4 MG TBPK tablet Take tapering dose pack as directed  ? [DISCONTINUED] ondansetron (ZOFRAN-ODT) 4 MG disintegrating tablet Take 1 tablet (4 mg total) by mouth every 6 (six) hours as needed for nausea or vomiting.  ?  ? ?Allergies:   Bee venom  ? ?Social History  ? ?Socioeconomic History  ? Marital status: Married  ?  Spouse name: Not on file  ? Number of children: 1  ? Years of education: Not on file  ? Highest education level: Not on file  ?Occupational History  ? Occupation: retired on disability  ?Tobacco Use  ? Smoking status: Never  ? Smokeless tobacco: Never  ?Vaping Use  ? Vaping Use: Never used  ?Substance and Sexual Activity  ? Alcohol use: Not Currently  ? Drug use: No  ? Sexual activity: Not Currently  ?  Partners: Male  ?Other Topics Concern  ? Not on file  ?Social History Narrative  ? Married, caregiver, limited exercise.   01/2018.  ? ?Social Determinants of Health  ? ?Financial Resource Strain: Not on file  ?Food Insecurity: Not on file  ?Transportation Needs: Not on file  ?Physical Activity: Not on file  ?Stress: Not on file  ?Social Connections: Not on file  ?  ?Social: Has lost a lost of people I the past two years.  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 Darreld Mclean ? ?Family History: ?The patient's family history includes Alcohol abuse in her sister; Arthritis in her father; Atrial fibrillation in her father; Cancer in her  sister; Colon cancer in her paternal uncle; Diabetes in her mother; Heart disease in her maternal grandfather, maternal grandmother, and sister; Hypertension in her brother, father, and sister; Polycystic ovary syndrome in her daughter; Pulmonary embolism in her sister; Stroke in her mother. There is no history of Esophageal cancer or Stomach cancer. ?Sister had MI related to illegal substance ?Father had PPM ? ?ROS:   ?Please see the history of present illness.    ? All other systems reviewed and are negative. ? ?EKGs/Labs/Other Studies Reviewed:   ? ?The following studies were reviewed today: ? ?EKG:   ?03/27/21: SR  rate 63 WNL ? ?Transthoracic Echocardiogram: ?Date: 01/23/14 ?Results: ?- Left ventricle: The cavity size was normal. Wall thickness was  ?  increased in a pattern of moderate LVH. Systolic function was  ?  normal. The estimated ejection fraction was in the range of 55%  ?  to 60%.  ? ?Recent Labs: ?04/26/2021: ALT 18; BUN 32; Creatinine, Ser 1.73; Hemoglobin 12.3; Platelets 258; Potassium 4.1; Sodium 139  ?Recent Lipid Panel ?   ?Component Value Date/Time  ? CHOL 116 02/19/2021 1219  ? TRIG 104 02/19/2021 1219  ? HDL 59 02/19/2021 1219  ? CHOLHDL 2.0 02/19/2021 1219  ? CHOLHDL 4.0 12/16/2016 1244  ? VLDL 36 (H) 07/17/2015 0001  ? [place] 38 02/19/2021 1219  ? [place] 94 12/16/2016 1244  ? ? ?Physical Exam:   ? ?VS:  BP 130/62   Pulse 70   Ht '5\' 7"'$  (1.702 m)   Wt 233 lb (105.7 kg)   SpO2 96%   BMI 36.49 kg/m?    ? ?Wt Readings from Last 3 Encounters:  ?08/03/21 233 lb (105.7 kg)  ?07/03/21 233 lb (105.7 kg)  ?04/26/21 233 lb (105.7 kg)  ?  ?Gen: No distress, Morbid obesity   ?Neck: No JVD ?Cardiac: No Rubs or Gallops, no murmur, regular rhythm +2 radial pulses ?Respiratory: Clear to auscultation bilaterally, normal effort, normal  respiratory rate ?GI: Soft, nontender, non-distended  ?MS: +1 bilateral LE edema painful to touch;  moves all extremities ?Integument: Skin feels warm ?Neuro:  At time of  evaluation, alert and oriented to person/place/time/situation  ?Psych: Depressed mood and affect ? ? ?ASSESSMENT:   ? ?1. Pain in both lower extremities   ?2. Leg swelling   ?3. Varicose veins of both lower ex

## 2021-08-03 ENCOUNTER — Encounter: Payer: Self-pay | Admitting: Internal Medicine

## 2021-08-03 ENCOUNTER — Ambulatory Visit (INDEPENDENT_AMBULATORY_CARE_PROVIDER_SITE_OTHER): Payer: Medicare Other | Admitting: Internal Medicine

## 2021-08-03 VITALS — BP 130/62 | HR 70 | Ht 67.0 in | Wt 233.0 lb

## 2021-08-03 DIAGNOSIS — N1831 Chronic kidney disease, stage 3a: Secondary | ICD-10-CM

## 2021-08-03 DIAGNOSIS — I10 Essential (primary) hypertension: Secondary | ICD-10-CM

## 2021-08-03 DIAGNOSIS — M7989 Other specified soft tissue disorders: Secondary | ICD-10-CM | POA: Diagnosis not present

## 2021-08-03 DIAGNOSIS — I8393 Asymptomatic varicose veins of bilateral lower extremities: Secondary | ICD-10-CM

## 2021-08-03 DIAGNOSIS — R600 Localized edema: Secondary | ICD-10-CM

## 2021-08-03 DIAGNOSIS — M79604 Pain in right leg: Secondary | ICD-10-CM

## 2021-08-03 DIAGNOSIS — M79605 Pain in left leg: Secondary | ICD-10-CM

## 2021-08-03 NOTE — Patient Instructions (Signed)
Medication Instructions:  ?Your physician recommends that you continue on your current medications as directed. Please refer to the Current Medication list given to you today. ? ?*If you need a refill on your cardiac medications before your next appointment, please call your pharmacy* ? ? ?Lab Work: ?NONE ?If you have labs (blood work) drawn today and your tests are completely normal, you will receive your results only by: ?MyChart Message (if you have MyChart) OR ?A paper copy in the mail ?If you have any lab test that is abnormal or we need to change your treatment, we will call you to review the results. ? ? ?Testing/Procedures: ?Your physician has requested that you have an ankle brachial index (ABI). During this test an ultrasound and blood pressure cuff are used to evaluate the arteries that supply the arms and legs with blood. Allow thirty minutes for this exam. There are no restrictions or special instructions. ?Your physician has requested that you have a lower extremities venous duplex. This test is an ultrasound of the veins in the legs. It looks at venous blood flow that carries blood from the heart to the legs. Allow one hour for a Lower Venous exam.  There are no restrictions or special instructions. ?  ? ? ?Follow-Up: ?At [facility], you and your health needs are our priority.  As part of our continuing mission to provide you with exceptional heart care, we have created designated Provider Care Teams.  These Care Teams include your primary Cardiologist (physician) and Advanced Practice Providers (APPs -  Physician Assistants and Nurse Practitioners) who all work together to provide you with the care you need, when you need it. ? ?We recommend signing up for the patient portal called "MyChart".  Sign up information is provided on this After Visit Summary.  MyChart is used to connect with patients for Virtual Visits (Telemedicine).  Patients are able to view lab/test results, encounter notes,  upcoming appointments, etc.  Non-urgent messages can be sent to your provider as well.   ?To learn more about what you can do with MyChart, go to [email or web address].   ? ?Your next appointment:   ?6 month(s) ? ?The format for your next appointment:   ?In Person ? ?Provider:   ?Werner Lean, MD   ? ? ?Important Information About Sugar ? ? ? ? ?  ?

## 2021-08-04 ENCOUNTER — Other Ambulatory Visit: Payer: Self-pay | Admitting: Internal Medicine

## 2021-08-04 DIAGNOSIS — I739 Peripheral vascular disease, unspecified: Secondary | ICD-10-CM

## 2021-08-04 DIAGNOSIS — M7989 Other specified soft tissue disorders: Secondary | ICD-10-CM

## 2021-08-07 ENCOUNTER — Ambulatory Visit (HOSPITAL_COMMUNITY)
Admission: RE | Admit: 2021-08-07 | Discharge: 2021-08-07 | Disposition: A | Discharge status: Home / Self Care | Payer: Medicare Other | Source: Ambulatory Visit | Attending: Cardiovascular Disease | Admitting: Cardiovascular Disease

## 2021-08-07 ENCOUNTER — Ambulatory Visit (HOSPITAL_BASED_OUTPATIENT_CLINIC_OR_DEPARTMENT_OTHER)
Admission: RE | Admit: 2021-08-07 | Discharge: 2021-08-07 | Disposition: A | Discharge status: Home / Self Care | Payer: Medicare Other | Source: Ambulatory Visit | Attending: Cardiovascular Disease | Admitting: Cardiovascular Disease

## 2021-08-07 DIAGNOSIS — M79605 Pain in left leg: Secondary | ICD-10-CM

## 2021-08-07 DIAGNOSIS — I739 Peripheral vascular disease, unspecified: Secondary | ICD-10-CM | POA: Insufficient documentation

## 2021-08-07 DIAGNOSIS — M7989 Other specified soft tissue disorders: Secondary | ICD-10-CM | POA: Insufficient documentation

## 2021-08-07 DIAGNOSIS — M79604 Pain in right leg: Secondary | ICD-10-CM

## 2021-08-11 ENCOUNTER — Telehealth: Payer: Self-pay | Admitting: Internal Medicine

## 2021-08-11 DIAGNOSIS — I739 Peripheral vascular disease, unspecified: Secondary | ICD-10-CM

## 2021-08-11 NOTE — Telephone Encounter (Signed)
The patient has been notified of the result and verbalized understanding.  All questions (if any) were answered. Emma [name] N Mery Guadalupe, RN 08/11/2021 12:20 PM   Pt reports LLE turns red and itches bad after standing for an hour.  Referral placed for PV consult.

## 2021-08-11 NOTE — Telephone Encounter (Signed)
-----   Message from Werner Lean, MD sent at 08/09/2021  2:25 PM EDT ----- Results: No evidence of DVT Mild PAD Right; unchanged from prior Plan: Will offer VVS referral for possible venous intervention, similar to 2019  Werner Lean, MD

## 2021-08-11 NOTE — Telephone Encounter (Signed)
° °  Pt is returning call to get lab result °

## 2021-08-13 ENCOUNTER — Telehealth (INDEPENDENT_AMBULATORY_CARE_PROVIDER_SITE_OTHER): Payer: Medicare Other | Admitting: Medical

## 2021-08-13 ENCOUNTER — Encounter: Payer: Self-pay | Admitting: Medical

## 2021-08-13 VITALS — Wt 230.0 lb

## 2021-08-13 DIAGNOSIS — R21 Rash and other nonspecific skin eruption: Secondary | ICD-10-CM | POA: Diagnosis not present

## 2021-08-13 DIAGNOSIS — L237 Allergic contact dermatitis due to plants, except food: Secondary | ICD-10-CM | POA: Diagnosis not present

## 2021-08-13 MED ORDER — TRIAMCINOLONE ACETONIDE 0.1 % EX CREA: 1.0000 "application " | TOPICAL_CREAM | Freq: Two times a day (BID) | CUTANEOUS | 0 refills | Status: DC

## 2021-08-13 MED ORDER — PREDNISONE 10 MG PO TABS: ORAL_TABLET | ORAL | 0 refills | Status: DC

## 2021-08-13 NOTE — Progress Notes (Signed)
  Subjective:     Patient ID: Emma [name], female   DOB: 1955/04/27, 66 y.o.   MRN: [id number]  This visit type was conducted due to national recommendations for restrictions regarding the COVID-19 Pandemic (e.g. social distancing) in an effort to limit this patient's exposure and mitigate transmission in our community.  Due to their co-morbid illnesses, this patient is at least at moderate risk for complications without adequate follow up.  This format is felt to be most appropriate for this patient at this time.    Documentation for virtual audio and video telecommunications through [facility] encounter:  The patient was located at home. The provider was located in the office. The patient did consent to this visit and is aware of possible charges through their insurance for this visit.  The other persons participating in this telemedicine service were none. Time spent on call was 20 minutes and in review of previous records 20 minutes total.  This virtual service is not related to other E/M service within previous 7 days.   HPI Chief Complaint  Patient presents with   posion oak    Poision oak x Monday, located- left shein, right ear, panty line and starting to get it on right leg too. Taking benadryl gel    Virtual consult for poison oak exposure, rash.  Was out in her yard a few days ago doing yard work and pulling weeds.  Now has itchy rash on her leg, right ear, torso, getting it on the other leg 2.  Using topical Benadryl gel and oral Benadryl tablets.  Usually prednisone works really well.  No other exposure.  No other concerns.  Review of Systems As in subjective    Objective:   Physical Exam Due to coronavirus pandemic stay at home measures, patient visit was virtual and they were not examined in person.   Wt 230 lb (104.3 kg)   BMI 36.02 kg/m   Gen: wd, wn ,nad Skin: Urticarial erythematous rash in small patches on both legs anterior medially, behind right ear      Assessment:   Encounter Diagnoses  Name Primary?   Poison oak dermatitis Yes   Rash        Plan:     Begin medication as below.  Do not use the cream on the face or ears.  Continue Benadryl by mouth the next 2 days.  If not much improved within the next 3 to 4 days then call back.  Emma [name] was seen today for posion oak.  Diagnoses and all orders for this visit:  Poison oak dermatitis  Rash  Other orders -     predniSONE (DELTASONE) 10 MG tablet; 6 tablets all together day 1, 5 tablets day 2, 4 tablets day 3, 3 tablets day 4, 2 tablets day 5, 1 tablet day 6. -     triamcinolone cream (KENALOG) 0.1 %; Apply 1 application. topically 2 (two) times daily.  F/u prn

## 2021-08-18 ENCOUNTER — Institutional Professional Consult (permissible substitution): Payer: Medicare Other | Admitting: Cardiovascular Disease

## 2021-08-20 LAB — HM MAMMOGRAPHY

## 2021-08-24 ENCOUNTER — Encounter: Payer: Self-pay | Admitting: Cardiovascular Disease

## 2021-08-25 ENCOUNTER — Encounter: Payer: Self-pay | Admitting: *Deleted

## 2021-08-28 ENCOUNTER — Other Ambulatory Visit: Payer: Self-pay | Admitting: Medical

## 2021-09-17 ENCOUNTER — Other Ambulatory Visit: Payer: Self-pay | Admitting: Family Medicine

## 2021-09-18 ENCOUNTER — Other Ambulatory Visit: Payer: Self-pay | Admitting: Gastroenterology

## 2021-09-18 ENCOUNTER — Telehealth: Payer: Self-pay

## 2021-09-18 NOTE — Telephone Encounter (Signed)
Fax rec'd from [place] requesting script for Amitiza and Buspar.  However, at last office visit (01-2021) Dr. Havery Moros did not keep/put her on either medication. She would need to call and schedule an appointment or see her PCP (has appointment on 7-18). [facility] informed of reason we are not sending new scripts.

## 2021-09-23 ENCOUNTER — Other Ambulatory Visit: Payer: Self-pay | Admitting: Medical

## 2021-09-24 ENCOUNTER — Other Ambulatory Visit: Payer: Self-pay | Admitting: Medical

## 2021-10-06 ENCOUNTER — Ambulatory Visit (INDEPENDENT_AMBULATORY_CARE_PROVIDER_SITE_OTHER): Payer: Medicare Other | Admitting: Medical

## 2021-10-06 ENCOUNTER — Encounter: Payer: Self-pay | Admitting: Cardiovascular Disease

## 2021-10-06 ENCOUNTER — Ambulatory Visit (INDEPENDENT_AMBULATORY_CARE_PROVIDER_SITE_OTHER): Payer: Medicare Other | Admitting: Cardiovascular Disease

## 2021-10-06 VITALS — BP 134/72 | HR 75 | Ht 67.0 in | Wt 237.6 lb

## 2021-10-06 VITALS — BP 120/70 | HR 64 | Wt 239.6 lb

## 2021-10-06 DIAGNOSIS — E785 Hyperlipidemia, unspecified: Secondary | ICD-10-CM

## 2021-10-06 DIAGNOSIS — M069 Rheumatoid arthritis, unspecified: Secondary | ICD-10-CM

## 2021-10-06 DIAGNOSIS — I739 Peripheral vascular disease, unspecified: Secondary | ICD-10-CM

## 2021-10-06 DIAGNOSIS — E559 Vitamin D deficiency, unspecified: Secondary | ICD-10-CM

## 2021-10-06 DIAGNOSIS — Z113 Encounter for screening for infections with a predominantly sexual mode of transmission: Secondary | ICD-10-CM

## 2021-10-06 DIAGNOSIS — K5909 Other constipation: Secondary | ICD-10-CM

## 2021-10-06 DIAGNOSIS — N1832 Chronic kidney disease, stage 3b: Secondary | ICD-10-CM

## 2021-10-06 DIAGNOSIS — I872 Venous insufficiency (chronic) (peripheral): Secondary | ICD-10-CM

## 2021-10-06 DIAGNOSIS — I10 Essential (primary) hypertension: Secondary | ICD-10-CM

## 2021-10-06 DIAGNOSIS — I8393 Asymptomatic varicose veins of bilateral lower extremities: Secondary | ICD-10-CM

## 2021-10-06 DIAGNOSIS — D649 Anemia, unspecified: Secondary | ICD-10-CM

## 2021-10-06 DIAGNOSIS — E118 Type 2 diabetes mellitus with unspecified complications: Secondary | ICD-10-CM

## 2021-10-06 NOTE — Progress Notes (Signed)
Cardiology Office Note   Date:  10/06/2021   ID:  Emma [name], DOB 1955/10/13, MRN [id number]  PCP:  Carlena Hurl, PA-C  Cardiologist:  Dr. Gasper Sells  No chief complaint on file.     History of Present Illness: Emma [name] is a 66 y.o. female who was referred for evaluation management of chronic venous insufficiency. Emma [name] known history of essential hypertension, type 2 diabetes, and hyperlipidemia. She [name] known history of chronic venous insufficiency with previous bilateral GSV ablation. She [name] been followed by Dr. Doren Custard in the past and underwent bilateral laser ablation of great saphenous vein in 2021 with some improvement in symptoms.  She reports some worsening leg claudication especially on the left side.  The swelling is worse at the end of the day and after prolonged standing.  She describes pain in her ankles but no calf or thigh pain.  No chest pain or shortness of breath.  She underwent recent noninvasive vascular studies in May which showed noncompressible vessels bilaterally with normal left toe pressure and mildly abnormal on the right. She had venous Doppler study that showed no evidence of DVT but it was not reflux study.  Past Medical History:  Diagnosis Date   Chronic constipation    Cluster headache syndrome 04/27/13   Colon polyp 06/11/2011   Most recent 04/23/08, no polyp--mild diverticulosis.  Repeat 10 yrs.   Depression with anxiety 02/22/2008   Qualifier: Diagnosis of  By: Nolon Rod CMA (AAMA), Robin     Diabetes mellitus without complication ([place])    Diverticulosis    GERD (gastroesophageal reflux disease) 06/11/2011   Hypertension    Recently diagnosed   Migraine syndrome    Osteopenia 11/2016   bone density. plan repeat in 11/2018   Overweight(278.02) 06/11/2011   PUD (peptic ulcer disease) 04/23/08   Multiple antrum ulcers; h pylori neg   Rheumatoid arthritis(714.0)     Past Surgical History:  Procedure Laterality Date   ABDOMINAL  HYSTERECTOMY     APPENDECTOMY     BACK SURGERY     3 times   CARDIOVASCULAR STRESS TEST  06/22/2011   Normal; EF 65%   CHOLECYSTECTOMY     COLONOSCOPY  04/23/2008   mild diverticulosis, internal hemorrhoids; Dr. Erskine Emery   ENDOVENOUS ABLATION SAPHENOUS VEIN W/ LASER Left 06/07/2019   endovenous laser ablation left greater saphenous vein and stab phlebectomy < 10 incisions left leg by Gae Gallop MD   ENDOVENOUS ABLATION SAPHENOUS VEIN W/ LASER Right 12/27/2019   LIVER BIOPSY  03/2007   Chronic active hepatitis w/fibrosis--related to hx of methotrexate treatment.   oral implants  2022   SKIN BIOPSY  08/16/2017   superficial basal cell carcinoma left inferior medial anterior tibia      Current Outpatient Medications  Medication Sig Dispense Refill   acetaminophen (TYLENOL) 500 MG tablet Take 1,500 mg by mouth once as needed for mild pain or moderate pain.      ALPRAZolam (XANAX) 0.5 MG tablet TAKE 1 TABLET BY MOUTH EVERYDAY AT BEDTIME 30 tablet 0   BIOTIN PO Take 1 capsule by mouth daily.      busPIRone (BUSPAR) 7.5 MG tablet TAKE 1 TABLET BY MOUTH 2 TIMES A DAY. PLEASE SCHEDULE OFFICE VISIT FOR REFILL 60 tablet 1   calcitRIOL (ROCALTROL) 0.25 MCG capsule Take 0.25 mcg by mouth 3 (three) times a week.     calcium carbonate ([employer] CALCIUM) 1500 (600 Ca) MG TABS tablet TAKE 1 TABLET (600  MG TOTAL) BY MOUTH 2 (TWO) TIMES DAILY WITH A MEAL. 150 tablet 1   carisoprodol (SOMA) 350 MG tablet TAKE 1 TABLET BY MOUTH TWICE A DAY AND AT BEDTIME AS NEEDED FOR MUSCLE SPASMS  0   dicyclomine (BENTYL) 10 MG capsule Take 1-2 capsules (10-20 mg total) by mouth every 8 (eight) hours as needed for spasms. 30 capsule 1   Docusate Calcium (STOOL SOFTENER PO) Take 1 capsule by mouth as needed.     furosemide (LASIX) 20 MG tablet Take 1 tablet (20 mg total) by mouth daily. 90 tablet 3   gabapentin (NEURONTIN) 300 MG capsule TAKE 1 CAPSULE BY MOUTH THREE TIMES A DAY AS NEEDED 270 capsule 0   [name] 10  MG TABS tablet Take 10 mg by mouth every morning.     lisinopril (ZESTRIL) 30 MG tablet Take 30 mg by mouth daily.     metFORMIN (GLUCOPHAGE) 500 MG tablet TAKE 1 TABLET BY MOUTH EVERY DAY WITH BREAKFAST 90 tablet 1   mirtazapine (REMERON) 15 MG tablet TAKE 1/2 OR 1 TABLET BY MOUTH AT BEDTIME 30 tablet 0   Multiple Vitamins-Iron (MULTIVITAMINS WITH IRON) TABS tablet Take 1 tablet by mouth daily. 90 tablet 0   omeprazole (PRILOSEC) 40 MG capsule TAKE 1 CAPSULE BY MOUTH EVERY DAY 90 capsule 0   rosuvastatin (CRESTOR) 20 MG tablet Take 1 tablet (20 mg total) by mouth daily. 90 tablet 3   triamcinolone cream (KENALOG) 0.1 % Apply 1 application. topically 2 (two) times daily. 30 g 0   verapamil (CALAN-SR) 240 MG CR tablet TAKE 1 TABLET BY MOUTH EVERYDAY AT BEDTIME 90 tablet 0   No current facility-administered medications for this visit.    Allergies:   Bee venom    Social History:  The patient  reports that she [name] never smoked. She [name] never used smokeless tobacco. She reports that she does not currently use alcohol. She reports that she does not use drugs.   Family History:  The patient's family history includes Alcohol abuse in her sister; Arthritis in her father; Atrial fibrillation in her father; Cancer in her sister; Colon cancer in her paternal uncle; Diabetes in her mother; Heart disease in her maternal grandfather, maternal grandmother, and sister; Hypertension in her brother, father, and sister; Polycystic ovary syndrome in her daughter; Pulmonary embolism in her sister; Stroke in her mother.    ROS:  Please see the history of present illness.   Otherwise, review of systems are positive for none.   All other systems are reviewed and negative.    PHYSICAL EXAM: VS:  BP 134/72   Pulse 75   Ht '5\' 7"'$  (1.702 m)   Wt 237 lb 9.6 oz (107.8 kg)   SpO2 98%   BMI 37.21 kg/m  , BMI Body mass index is 37.21 kg/m. GEN: Well nourished, well developed, in no acute distress  HEENT: normal   Neck: no JVD, carotid bruits, or masses Cardiac: RRR; no murmurs, rubs, or gallops,  Respiratory:  clear to auscultation bilaterally, normal work of breathing GI: soft, nontender, nondistended, + BS MS: no deformity or atrophy  Skin: warm and dry, no rash Neuro:  Strength and sensation are intact Psych: euthymic mood, full affect There is mild swelling bilaterally worse on the left side.  Dorsalis pedis is palpable on both sides.  EKG:  EKG is not ordered today.    Recent Labs: 04/26/2021: ALT 18; BUN 32; Creatinine, Ser 1.73; Hemoglobin 12.3; Platelets 258; Potassium 4.1; Sodium  139    Lipid Panel    Component Value Date/Time   CHOL 116 02/19/2021 1219   TRIG 104 02/19/2021 1219   HDL 59 02/19/2021 1219   CHOLHDL 2.0 02/19/2021 1219   CHOLHDL 4.0 12/16/2016 1244   VLDL 36 (H) 07/17/2015 0001   LDLCALC 38 02/19/2021 1219   LDLCALC 94 12/16/2016 1244      Wt Readings from Last 3 Encounters:  10/06/21 237 lb 9.6 oz (107.8 kg)  08/13/21 230 lb (104.3 kg)  08/03/21 233 lb (105.7 kg)            No data to display            ASSESSMENT AND PLAN:  1.  Chronic venous insufficiency: Status post laser ablation of bilateral great saphenous veins.  She seems to have some recurrent symptoms although not as severe as before.  I discussed with her the importance of frequent leg elevation during the day and also knee-high support stockings to be used during the day.  She does not do that all the time.  If her symptoms do not improve, I advised her to follow up again with Dr. Scot Dock who performed her venous ablation.  She does not seem to be interested in repeat procedures.  2.  Possible mild peripheral arterial disease: Her vessels were noncompressible indicating some degree of atherosclerosis.  By exam, she [name] palpable dorsalis pedis and I do not think she [name] critical disease.  Continue treatment of risk factors.  She is not a smoker.  3.  Essential hypertension: Blood  pressure is controlled on current medications.  4.  Hyperlipidemia: Currently on rosuvastatin 20 mg once daily.  Recommended target LDL of less than 70 given that she is diabetic and likely [name] underlying peripheral arterial disease.    Disposition:   Follow-up with me as needed.  Signed,  Kathlyn Sacramento, MD  10/06/2021 1:24 PM    [facility]

## 2021-10-06 NOTE — Progress Notes (Signed)
Subjective:  Emma [name] is a 66 y.o. female who presents for Chief Complaint  Patient presents with   nonfasting med check    Nonfasting med check. Eyes are costantly watering. Would like libre due to kidney function of stage 3      Here for routine med check.  Her gastro doctor was refilling her BuSpar and Amitiza but he asked her to have this refilled here now.  She does not member which dose of Amitiza she is taking.  She is not completely out of BuSpar but does need a refill on Amitiza.  Diabetes-compliant with metformin.  No foot concerns.  She does not have a glucometer.  No symptoms of concern  She does note some watery eyes.  Her eyes are staying watery.  Not sure why.  Wants something for this. no redness.  No BP drainage.  No facial itching runny nose or sneezing  Sees a kidney specialist.  She was started on Farxiga but had a massive yeast infection.  Was switched to [facility] and doing okay with this plus probiotics over-the-counter . saw kidney specialist recently had blood work done  I reviewed labs in [place] from 09/16/21.   CBC normal, Creatinine 1.25, eGFR 47, 03/26/21, Hgba1C 6.7%  She has a new friend that she is getting more serious with as far as relationship.  She may consider a sexual relationship.  She would like STD screen to be able to give him a lab results showing no STD.  She is not currently sexually active with anybody since her late husband died   No other aggravating or relieving factors.    No other c/o.  The following portions of the patient's history were reviewed and updated as appropriate: allergies, current medications, past family history, past medical history, past social history, past surgical history and problem list.  ROS Otherwise as in subjective above  Objective: BP 120/70   Pulse 64   Wt 239 lb 9.6 oz (108.7 kg)   BMI 37.53 kg/m   General appearance: alert, no distress, well developed, well nourished Neck: supple, no  lymphadenopathy, no thyromegaly, no masses Heart: RRR, normal S1, S2, no murmurs Lungs: CTA bilaterally, no wheezes, rhonchi, or rales Pulses: 2+ radial pulses, 2+ pedal pulses, normal cap refill Ext: no edema   Assessment: Encounter Diagnoses  Name Primary?   Diabetes mellitus with complication ([place]) Yes   Screen for STD (sexually transmitted disease)    Chronic venous insufficiency    Hyperlipidemia, unspecified hyperlipidemia type    Vitamin D deficiency    Varicose veins of both lower extremities, unspecified whether complicated    Stage 3b chronic kidney disease (HCC)    Rheumatoid arthritis, involving unspecified site, unspecified whether rheumatoid factor present ([place])    Anemia, unspecified type    Chronic constipation      Plan: Diabetes-updated labs today, continue metformin.  I advised her that insurance not pay for freestyle libre monitor without being on insulin which she is not.  Screening for STD at her request  Hyperlipidemia-continue statin  Vitamin D deficiency -continue supplement  CKD 3-I reviewed recent labs and notes in the computer from her kidney specialist visit.  Continue to hydrate well and avoid NSAIDs  Chronic constipation-she will call back let me know the dose of her Amitiza so I can send in refill  Rheumatoid arthritis-sees rheumatology   Emma [name] was seen today for nonfasting med check.  Diagnoses and all orders for this visit:  Diabetes mellitus  with complication ([place]) -     Hemoglobin A1c  Screen for STD (sexually transmitted disease) -     RPR+HIV+GC+CT Panel -     Hepatitis C antibody -     Hepatitis B surface antigen  Chronic venous insufficiency  Hyperlipidemia, unspecified hyperlipidemia type  Vitamin D deficiency  Varicose veins of both lower extremities, unspecified whether complicated  Stage 3b chronic kidney disease (HCC)  Rheumatoid arthritis, involving unspecified site, unspecified whether rheumatoid factor  present ([place])  Anemia, unspecified type  Chronic constipation    Follow up: pending labs

## 2021-10-06 NOTE — Patient Instructions (Signed)
Medication Instructions:  No changes *If you need a refill on your cardiac medications before your next appointment, please call your pharmacy*   Lab Work: None ordered If you have labs (blood work) drawn today and your tests are completely normal, you will receive your results only by: [place] (if you have MyChart) OR A paper copy in the mail If you have any lab test that is abnormal or we need to change your treatment, we will call you to review the results.   Testing/Procedures: None ordered   Follow-Up: At [facility], you and your health needs are our priority.  As part of our continuing mission to provide you with exceptional heart care, we have created designated Provider Care Teams.  These Care Teams include your primary Cardiologist (physician) and Advanced Practice Providers (APPs -  Physician Assistants and Nurse Practitioners) who all work together to provide you with the care you need, when you need it.  We recommend signing up for the patient portal called "MyChart".  Sign up information is provided on this After Visit Summary.  MyChart is used to connect with patients for Virtual Visits (Telemedicine).  Patients are able to view lab/test results, encounter notes, upcoming appointments, etc.  Non-urgent messages can be sent to your provider as well.   To learn more about what you can do with MyChart, go to [email or web address].    Your next appointment:   Follow up as needed with Dr. Fletcher Anon  Important Information About Sugar

## 2021-10-07 ENCOUNTER — Telehealth: Payer: Self-pay | Admitting: Medical

## 2021-10-07 ENCOUNTER — Other Ambulatory Visit: Payer: Self-pay | Admitting: Medical

## 2021-10-07 MED ORDER — METFORMIN HCL 500 MG PO TABS: ORAL_TABLET | ORAL | 1 refills | Status: DC

## 2021-10-07 MED ORDER — GABAPENTIN 300 MG PO CAPS: ORAL_CAPSULE | ORAL | 2 refills | Status: DC

## 2021-10-07 MED ORDER — LUBIPROSTONE 24 MCG PO CAPS: 24.0000 ug | ORAL_CAPSULE | Freq: Two times a day (BID) | ORAL | 0 refills | Status: DC

## 2021-10-07 NOTE — Telephone Encounter (Signed)
Pt says she's supposed to be letting you know how many mg she uses for her Amitiza and she sees it says 24 mcg.

## 2021-10-09 LAB — RPR+HIV+GC+CT PANEL
Chlamydia trachomatis, NAA: NEGATIVE
HIV Screen 4th Generation wRfx: NONREACTIVE
Neisseria Gonorrhoeae by PCR: NEGATIVE
RPR Ser Ql: NONREACTIVE

## 2021-10-09 LAB — HEMOGLOBIN A1C
Est. average glucose Bld gHb Est-mCnc: 148 mg/dL
Hgb A1c MFr Bld: 6.8 % — ABNORMAL HIGH (ref 4.8–5.6)

## 2021-10-09 LAB — HEPATITIS C ANTIBODY: Hep C Virus Ab: NONREACTIVE

## 2021-10-09 LAB — HEPATITIS B SURFACE ANTIGEN: Hepatitis B Surface Ag: NEGATIVE

## 2021-10-13 ENCOUNTER — Other Ambulatory Visit: Payer: Self-pay | Admitting: Gastroenterology

## 2021-10-16 ENCOUNTER — Other Ambulatory Visit: Payer: Self-pay | Admitting: Family Medicine

## 2021-11-02 ENCOUNTER — Telehealth: Payer: Self-pay | Admitting: Pharmacist

## 2021-11-02 ENCOUNTER — Ambulatory Visit: Payer: Self-pay

## 2021-11-02 NOTE — Patient Outreach (Signed)
  Care Coordination   Initial Visit Note   11/02/2021 Name: Emma [name] MRN: [id number] DOB: 12-20-1955  Emma [name] is a 66 y.o. year old female who sees Tysinger, Camelia Eng, PA-C for primary care. I spoke with  Emma [name] by phone today  What matters to the patients health and wellness today?  I have a hard time affording my medication    Goals Addressed             This Visit's Progress    Care Coordination Activities       Care Coordination Interventions: SDoH screening completed - no acute resource needs identified Determined the patient has difficulty affording medications Discussed the patient has been prescribed [name] by her Nephrologist which was $500 out of pocket with first fill. Patient has worked with the office to complete a patient assistance form but is unsure of the outcome and has approximately 1 week of medication left Unsuccessful outbound call placed to [place], voice message left requesting a return call to determine the status of patient assistance application Patient reports she also has a hard time affording [place] Performed chart review to note patient was switched from [place] to [place]; patient confirms switch and also confirms difficulty with cost Advised the patient SW will refer patient to the pharmacy team to determine if she is eligible for patient assistance Discussed plans to also refer the patient to [place] for ongoing care coordination needs Patient agreeable to both referrals        SDOH assessments and interventions completed:  Yes  SDOH Interventions Today    Flowsheet Row Most Recent Value  SDOH Interventions   Food Insecurity Interventions Intervention Not Indicated  Housing Interventions Intervention Not Indicated  Transportation Interventions Intervention Not Indicated        Care Coordination Interventions Activated:  Yes  Care Coordination Interventions:  Yes, provided   Follow up  plan: Referral made to [facility] and [place] team    Encounter Outcome:  Pt. Visit Completed   Daneen Schick, BSW, CDP Social Worker, Certified Dementia Practitioner Care Coordination [phone]

## 2021-11-02 NOTE — Progress Notes (Unsigned)
[name])  [place] Team    11/02/2021  Emma [name] 19-Feb-1956 [id number]  Reason for referral: Medication Assistance  Referral source: Emma [name] Social Worker Current insurance:  [employer] [place]   Outreach:  Successful telephone call with Emma [name].  HIPAA identifiers verified.   Objective: The ASCVD Risk score (Arnett DK, et al., 2019) failed to calculate for the following reasons:   The valid total cholesterol range is 130 to 320 mg/dL  Lab Results  Component Value Date   CREATININE 1.73 (H) 04/26/2021   CREATININE 1.22 (H) 02/19/2021   CREATININE 1.03 (H) 01/28/2020    Lab Results  Component Value Date   HGBA1C 6.8 (H) 10/06/2021    Lipid Panel     Component Value Date/Time   CHOL 116 02/19/2021 1219   TRIG 104 02/19/2021 1219   HDL 59 02/19/2021 1219   CHOLHDL 2.0 02/19/2021 1219   CHOLHDL 4.0 12/16/2016 1244   VLDL 36 (H) 07/17/2015 0001   LDLCALC 38 02/19/2021 1219   LDLCALC 94 12/16/2016 1244    BP Readings from Last 3 Encounters:  10/06/21 120/70  10/06/21 134/72  08/03/21 130/62    Allergies  Allergen Reactions   Bee Venom Anaphylaxis   Farxiga [Dapagliflozin]     Horrible yeast infection    Assessment: Spoke with patient via telephone, patient confirms that she worked with Dr. Theador Hawthorne at [place] for assistance program for [name]. I informed patient that I will call the office tomorrow and inquire about the status of the application. Patient states that she also would need more samples of the medication provided to her.    Medication Assistance Findings:  Medication assistance needs identified: [name] and [name]   Extra Help:  Not eligible for Extra Help Low Income Subsidy based on reported income and assets  Patient Assistance Programs: [name]  made by [employer] requirement met: Yes Out-of-pocket prescription expenditure met:   Not Applicable Patient  has met application requirements to apply for this program.     Additional medication assistance options reviewed with patient as warranted:  No other options identified  Plan: Will contact [facility] regarding application for [name] samples, as well as [name] patient assistance.  Will follow-up in 1-3 business days.   Loretha Brasil, PharmD [place] Pharmacist Office: [phone]

## 2021-11-02 NOTE — Patient Instructions (Signed)
Visit Information  Thank you for taking time to visit with me today. Please don't hesitate to contact me if I can be of assistance to you.   Following are the goals we discussed today:   Goals Addressed             This Visit's Progress    Care Coordination Activities       Care Coordination Interventions: SDoH screening completed - no acute resource needs identified Determined the patient has difficulty affording medications Discussed the patient has been prescribed Emma [name] by her Nephrologist which was $500 out of pocket with first fill. Patient has worked with the office to complete a patient assistance form but is unsure of the outcome and has approximately 1 week of medication left Unsuccessful outbound call placed to Emma [name], voice message left requesting a return call to determine the status of patient assistance application Patient reports she also has a hard time affording Emma [name] Performed chart review to note patient was switched from Emma [name] to Emma [name]; patient confirms switch and also confirms difficulty with cost Advised the patient SW will refer patient to the pharmacy team to determine if she is eligible for patient assistance Discussed plans to also refer the patient to Emma [name] for ongoing care coordination needs Patient agreeable to both referrals         Please call the care guide team at [phone] if you need to schedule an appointment with the care coordination team  If you are experiencing a Mental Health or Emma [name] or need someone to talk to, please call 1-800-273-TALK (toll free, 24 hour hotline)  The patient verbalized understanding of instructions, educational materials, and care plan provided today and DECLINED offer to receive copy of patient instructions, educational materials, and care plan.   The care management team will reach out to the patient again over the next 30 days.  The [facility] team  will follow up with the patient and will provide direct communication to the PCP for this patient.   Emma [name], BSW, CDP Social Worker, Certified Dementia Practitioner Care Coordination [phone]

## 2021-11-03 NOTE — Progress Notes (Signed)
[facility])                                            [place] Team    11/03/2021  Emma [name] July 15, 1955 [id number]  Placed telephone coordination call to [place], [place] location today and spoke with Emma [name]. Inquired about the status of the [name] application. Emma [name] stated that the wrong papers had been completed for the patient.  Emma [name] asked if [facility] can assist patient with application process for [name] as well as [name]. Emma [name] stated that she will call patient and provide samples of [name].   Placed telephonic coordination call to [name]'s patient assistance program and was able to be directed to the correct application for the patient.   Placed telephonic coordination call to patient's daughter, Emma [name] (at patient's request). Instructed [name] on how to create a myssa.gov account and instructed her that a printed social security awards letter would be needed for the application process.   Placed telephonic outreach to Emma [name] and relayed information to her. Emma [name] has already called her regarding samples of [name].  I made Emma [name] aware that this will be the first patient assistance application we have done for [name] and we are not sure of all of the specifications required for approval, however we will submit the required information and keep her updated on the outcome. Emma [name] verbalized understanding.    Patient Assistance Programs: Emma [name]  made by Emma [name] requirement met: Unknown Out-of-pocket prescription expenditure met:   Not Applicable Reviewed program requirements with patient.    [name] made by Emma [name] requirement met: Yes Out-of-pocket prescription expenditure met:   Not Applicable Patient has met application requirements to apply for this program.     Additional medication assistance options reviewed with  patient as warranted:  No other options identified  Plan: I will route patient assistance letter to [place] technician who will coordinate patient assistance program application process for medications listed above.  [facility] pharmacy technician will assist with obtaining all required documents from both patient and provider(s) and submit application(s) once completed.    Emma [name], PharmD [place] Pharmacist Office: [phone]

## 2021-11-04 ENCOUNTER — Ambulatory Visit: Payer: Self-pay

## 2021-11-04 NOTE — Patient Outreach (Signed)
  Care Coordination   Follow Up Visit Note   11/04/2021 Name: Emma [name] MRN: [id number] DOB: [birth year]  Emma [name] is a 66 y.o. year old female who sees Tysinger, Camelia Eng, PA-C for primary care. I  performed chart review.  What matters to the patients health and wellness today?  To afford medications    Goals Addressed             This Visit's Progress    COMPLETED: Care Coordination Activities       Care Coordination Interventions: Unsuccessful outbound call placed to the patient to follow up on goal progression; voice message left Performed chart review to note that on 8/14 the [place] spoke with the patients kidney provider to determine the application for [name] was filed incorrectly. Nephrologist to contact the patient to provide samples while PharmD assists the patient with a PAP for [name] and [place] PharmD spoke with the patient on 8/14 to begin process for patient assistance of both medications SW to sign off with option to reopen as needed Collaboration with [place] to advise of interventions and plan        SDOH assessments and interventions completed:  No     Care Coordination Interventions Activated:  Yes  Care Coordination Interventions:  Yes, provided   Follow up plan: No further intervention required.   Encounter Outcome:  Pt. Visit Completed   Daneen Schick, BSW, CDP Social Worker, Certified Dementia Practitioner Care Coordination [phone]

## 2021-11-05 ENCOUNTER — Ambulatory Visit: Payer: Self-pay

## 2021-11-05 NOTE — Patient Outreach (Signed)
  Care Coordination   11/05/2021 Name: Emma [name] MRN: [id number] DOB: 1955/04/20   Care Coordination Outreach Attempts:  An unsuccessful telephone outreach was attempted today to offer the patient information about available care coordination services as a benefit of their health plan.   Follow Up Plan:  Additional outreach attempts will be made to offer the patient care coordination information and services.   Encounter Outcome:  No Answer  Care Coordination Interventions Activated:  No   Care Coordination Interventions:  No, not indicated    Barb Merino, RN, BSN, CCM Care Management Coordinator [facility] Care Management  Direct Phone: [phone]

## 2021-11-18 ENCOUNTER — Telehealth: Payer: Self-pay | Admitting: Pharmacy Technician

## 2021-11-18 DIAGNOSIS — Z596 Low income: Secondary | ICD-10-CM

## 2021-11-18 NOTE — Progress Notes (Signed)
[facility])                                            [place] Team    11/18/2021  ORENE ABBASI March 09, 1956 [id number]                                      Medication Assistance Referral  Referral From: [place]  Medication/Company: Iona Hansen Patient application portion:  Mailed on 9/97/74 Provider application portion: Faxed  to Dr. Ulice Bold Camillia Herter) on 8/17 and 8/22 Provider address/fax verified via: Call to office  Medication/Company: [name] / [place] Patient application portion:  Mailed on 1/42/39 Provider application portion: Faxed  to Dr. Ulice Bold Camillia Herter) on 8/17 and 8/22 Provider address/fax verified via: Office website    [place] P. Dvora Buitron, [place]  [phone]

## 2021-11-25 ENCOUNTER — Telehealth: Payer: Self-pay | Admitting: Pharmacist

## 2021-11-25 NOTE — Progress Notes (Signed)
[facility])                                            [place] Team    11/25/2021  Emma [name] May 01, 1955 [id number]  Placed telephonic outreach to [place] location, spoke with Emma [name]. Emma [name] states she has the patient assistance forms, waiting for Dr. Theador Hawthorne to sign so she can return.    Loretha Brasil, PharmD [place] Pharmacist Office: [phone]

## 2021-11-26 ENCOUNTER — Other Ambulatory Visit: Payer: Self-pay | Admitting: Medical

## 2021-12-01 ENCOUNTER — Ambulatory Visit (INDEPENDENT_AMBULATORY_CARE_PROVIDER_SITE_OTHER): Payer: Medicare Other

## 2021-12-01 VITALS — Ht 67.0 in | Wt 227.0 lb

## 2021-12-01 DIAGNOSIS — Z0000 Encounter for general adult medical examination without abnormal findings: Secondary | ICD-10-CM

## 2021-12-01 NOTE — Progress Notes (Signed)
I connected with Emma [name] today by telephone and verified that I am speaking with the correct person using two identifiers. Location patient: home Location provider: work Persons participating in the virtual visit: Teckla, Christiansen LPN.   I discussed the limitations, risks, security and privacy concerns of performing an evaluation and management service by telephone and the availability of in person appointments. I also discussed with the patient that there may be a patient responsible charge related to this service. The patient expressed understanding and verbally consented to this telephonic visit.    Interactive audio and video telecommunications were attempted between this provider and patient, however failed, due to patient having technical difficulties OR patient did not have access to video capability.  We continued and completed visit with audio only.     Vital signs may be patient reported or missing.  Subjective:   Emma [name] is a 66 y.o. female who presents for Medicare Annual (Subsequent) preventive examination.  Review of Systems     Cardiac Risk Factors include: advanced age (>16mn, >>7women);diabetes mellitus;dyslipidemia;hypertension;obesity (BMI >30kg/m2)     Objective:    Today's Vitals   12/01/21 1226  Weight: 227 lb (103 kg)  Height: '5\' 7"'$  (1.702 m)   Body mass index is 35.55 kg/m.     12/01/2021   12:31 PM 04/26/2021    2:11 AM 11/24/2018    1:37 PM 05/23/2017    3:02 PM  Advanced Directives  Does Patient Have a Medical Advance Directive? Yes No Yes Yes  Type of Advance Directive Out of facility DNR (pink MOST or yellow form)  [place] will [place] will  Does patient want to make changes to medical advance directive?   No - Patient declined   Copy of [place] Chart?   No - copy requested   Would patient like information on creating a medical advance directive?   No - Patient declined      Current Medications (verified) Outpatient Encounter Medications as of 12/01/2021  Medication Sig   acetaminophen (TYLENOL) 500 MG tablet Take 1,500 mg by mouth once as needed for mild pain or moderate pain.    ALPRAZolam (XANAX) 0.5 MG tablet TAKE 1 TABLET BY MOUTH EVERYDAY AT BEDTIME   BIOTIN PO Take 1 capsule by mouth daily.    busPIRone (BUSPAR) 7.5 MG tablet Take 1 tablet (7.5 mg total) by mouth 2 (two) times daily.   calcitRIOL (ROCALTROL) 0.25 MCG capsule Take 0.25 mcg by mouth 3 (three) times a week.   calcium carbonate ([employer] CALCIUM) 1500 (600 Ca) MG TABS tablet TAKE 1 TABLET (600 MG TOTAL) BY MOUTH 2 (TWO) TIMES DAILY WITH A MEAL.   carisoprodol (SOMA) 350 MG tablet TAKE 1 TABLET BY MOUTH TWICE A DAY AND AT BEDTIME AS NEEDED FOR MUSCLE SPASMS   dicyclomine (BENTYL) 10 MG capsule Take 1-2 capsules (10-20 mg total) by mouth every 8 (eight) hours as needed for spasms.   furosemide (LASIX) 20 MG tablet Take 1 tablet (20 mg total) by mouth daily.   gabapentin (NEURONTIN) 300 MG capsule TAKE 1 CAPSULE BY MOUTH THREE TIMES A DAY AS NEEDED   [name] 10 MG TABS tablet Take 10 mg by mouth every morning.   [name] 10 MG TABS Take 1 tablet by mouth daily.   lisinopril (ZESTRIL) 30 MG tablet Take 30 mg by mouth daily.   Lubiprostone (AMITIZA PO) Take by mouth. Take 1-2 tablets as needed for  conspitation   lubiprostone (AMITIZA) 24 MCG capsule Take 1 capsule (24 mcg total) by mouth 2 (two) times daily with a meal.   metFORMIN (GLUCOPHAGE) 500 MG tablet TAKE 1 TABLET BY MOUTH EVERY DAY WITH BREAKFAST   mirtazapine (REMERON) 15 MG tablet TAKE 1/2 OR 1 TABLET BY MOUTH AT BEDTIME   Multiple Vitamins-Iron (MULTIVITAMINS WITH IRON) TABS tablet Take 1 tablet by mouth daily.   omeprazole (PRILOSEC) 40 MG capsule TAKE 1 CAPSULE BY MOUTH EVERY DAY   rosuvastatin (CRESTOR) 20 MG tablet Take 1 tablet (20 mg total) by mouth daily.   verapamil (CALAN-SR) 240 MG CR tablet TAKE 1  TABLET BY MOUTH EVERYDAY AT BEDTIME   No facility-administered encounter medications on file as of 12/01/2021.    Allergies (verified) Bee venom and Farxiga [dapagliflozin]   History: Past Medical History:  Diagnosis Date   Chronic constipation    Cluster headache syndrome 04/27/13   Colon polyp 06/11/2011   Most recent 04/23/08, no polyp--mild diverticulosis.  Repeat 10 yrs.   Depression with anxiety 02/22/2008   Qualifier: Diagnosis of  By: Nolon Rod CMA (AAMA), Robin     Diabetes mellitus without complication ([place])    Diverticulosis    GERD (gastroesophageal reflux disease) 06/11/2011   Hypertension    Recently diagnosed   Migraine syndrome    Osteopenia 11/2016   bone density. plan repeat in 11/2018   Overweight(278.02) 06/11/2011   PUD (peptic ulcer disease) 04/23/08   Multiple antrum ulcers; h pylori neg   Rheumatoid arthritis(714.0)    Past Surgical History:  Procedure Laterality Date   ABDOMINAL HYSTERECTOMY     APPENDECTOMY     BACK SURGERY     3 times   CARDIOVASCULAR STRESS TEST  06/22/2011   Normal; EF 65%   CHOLECYSTECTOMY     COLONOSCOPY  04/23/2008   mild diverticulosis, internal hemorrhoids; Dr. Erskine Emery   ENDOVENOUS ABLATION SAPHENOUS VEIN W/ LASER Left 06/07/2019   endovenous laser ablation left greater saphenous vein and stab phlebectomy < 10 incisions left leg by Gae Gallop MD   ENDOVENOUS ABLATION SAPHENOUS VEIN W/ LASER Right 12/27/2019   eyelid tightening Bilateral    09/2021   LIVER BIOPSY  03/2007   Chronic active hepatitis w/fibrosis--related to hx of methotrexate treatment.   oral implants  2022   SKIN BIOPSY  08/16/2017   superficial basal cell carcinoma left inferior medial anterior tibia    Family History  Problem Relation Age of Onset   Diabetes Mother    Stroke Mother    Arthritis Father    Hypertension Father    Atrial fibrillation Father    Alcohol abuse Sister    Cancer Sister        mesothelioma   Heart disease Sister         cardiomegaly   Hypertension Sister    Pulmonary embolism Sister    Hypertension Brother    Polycystic ovary syndrome Daughter    Heart disease Maternal Grandmother    Heart disease Maternal Grandfather    Colon cancer Paternal Uncle    Esophageal cancer Neg Hx    Stomach cancer Neg Hx    Social History   Socioeconomic History   Marital status: Married    Spouse name: Not on file   Number of children: 1   Years of education: Not on file   Highest education level: Not on file  Occupational History   Occupation: retired on disability  Tobacco Use   Smoking status: Never  Smokeless tobacco: Never  Vaping Use   Vaping Use: Never used  Substance and Sexual Activity   Alcohol use: Not Currently   Drug use: No   Sexual activity: Not Currently    Partners: Male  Other Topics Concern   Not on file  Social History Narrative   Married, caregiver, limited exercise.   01/2018.   Social Determinants of Health   Financial Resource Strain: Low Risk  (12/01/2021)   Overall Financial Resource Strain (CARDIA)    Difficulty of Paying Living Expenses: Not hard at all  Food Insecurity: No Food Insecurity (12/01/2021)   Hunger Vital Sign    Worried About Running Out of Food in the Last Year: Never true    Ran Out of Food in the Last Year: Never true  Transportation Needs: No Transportation Needs (12/01/2021)   PRAPARE - Hydrologist (Medical): No    Lack of Transportation (Non-Medical): No  Physical Activity: Inactive (12/01/2021)   Exercise Vital Sign    Days of Exercise per Week: 0 days    Minutes of Exercise per Session: 0 min  Stress: No Stress Concern Present (12/01/2021)   [place]    Feeling of Stress : Not at all  Social Connections: Not on file    Tobacco Counseling Counseling given: Not Answered   Clinical Intake:  Pre-visit preparation completed: Yes  Pain : No/denies  pain     Nutritional Status: BMI > 30  Obese Nutritional Risks: None Diabetes: Yes  How often do you need to have someone help you when you read instructions, pamphlets, or other written materials from your doctor or pharmacy?: 1 - Never What is the last grade level you completed in school?: 12th grade  Diabetic? Yes Nutrition Risk Assessment:  Has the patient had any N/V/D within the last 2 months?  Yes  Does the patient have any non-healing wounds?  No  Has the patient had any unintentional weight loss or weight gain?  No   Diabetes:  Is the patient diabetic?  Yes  If diabetic, was a CBG obtained today?  No  Did the patient bring in their glucometer from home?  No  How often do you monitor your CBG's? Does not.   Financial Strains and Diabetes Management:  Are you having any financial strains with the device, your supplies or your medication? No .  Does the patient want to be seen by Chronic Care Management for management of their diabetes?  No  Would the patient like to be referred to a Nutritionist or for Diabetic Management?  No   Diabetic Exams:  Diabetic Eye Exam: Overdue for diabetic eye exam. Pt has been advised about the importance in completing this exam. Patient advised to call and schedule an eye exam. Diabetic Foot Exam: Completed 02/19/2021   Interpreter Needed?: No  Information entered by :: NAllen LPN   Activities of Daily Living    12/01/2021   12:32 PM  In your present state of health, do you have any difficulty performing the following activities:  Hearing? 0  Vision? 0  Difficulty concentrating or making decisions? 0  Walking or climbing stairs? 0  Dressing or bathing? 0  Doing errands, shopping? 0  Preparing Food and eating ? N  Using the Toilet? N  In the past six months, have you accidently leaked urine? Y  Comment if held too long  Do you have problems with loss of  bowel control? N  Managing your Medications? N  Managing your Finances?  N  Housekeeping or managing your Housekeeping? N    Patient Care Team: Tysinger, Camelia Eng, PA-C as PCP - General (Family Medicine) Werner Lean, MD as PCP - Cardiology (Cardiology) Rex Kras, Claudette Stapler, RN as [place] any recent [place] you may have received from other than [facility] providers in the past year (date may be approximate).     Assessment:   This is a routine wellness examination for [place].  Hearing/Vision screen Vision Screening - Comments:: No regular eye exams,  Dietary issues and exercise activities discussed: Current Exercise Habits: The patient does not participate in regular exercise at present   Goals Addressed             This Visit's Progress    Patient Stated       12/01/2021, wants to get kidneys in better shape, getting diabetes under control, and losing weight       Depression Screen    12/01/2021   12:32 PM 10/06/2021    2:42 PM 08/13/2021   10:14 AM 01/29/2021   11:42 AM 07/10/2019   12:06 PM 09/27/2018   11:19 AM 01/23/2018    1:45 PM  PHQ 2/9 Scores  PHQ - 2 Score 0 0 0 3 3 0 1  PHQ- 9 Score    4 7      Fall Risk    12/01/2021   12:32 PM 10/06/2021    2:42 PM 08/13/2021   10:14 AM 01/29/2021   11:42 AM 07/10/2019   12:06 PM  [place] in the past year? 0 1 0 0 0  Number falls in past yr: 0 0 0 0   Injury with Fall? 0 1 0 0   Risk for fall due to : Medication side effect Impaired balance/gait No Fall Risks No Fall Risks   Follow up Falls prevention discussed;Education provided;Falls evaluation completed Falls evaluation completed Falls evaluation completed Falls evaluation completed     FALL RISK PREVENTION PERTAINING TO THE HOME:  Any stairs in or around the home? No  If so, are there any without handrails? N/a Home free of loose throw rugs in walkways, pet beds, electrical cords, etc? Yes  Adequate lighting in your home to reduce risk of falls? Yes   ASSISTIVE DEVICES  UTILIZED TO PREVENT FALLS:  Life alert? No  Use of a cane, walker or w/c? No  Grab bars in the bathroom? Yes  Shower chair or bench in shower? Yes  Elevated toilet seat or a handicapped toilet? No   TIMED UP AND GO:  Was the test performed? No .      Cognitive Function:        12/01/2021   12:34 PM  6CIT Screen  What Year? 0 points  What month? 0 points  What time? 0 points  Count back from 20 0 points  Months in reverse 0 points  Repeat phrase 2 points  Total Score 2 points    Immunizations Immunization History  Administered Date(s) Administered   Influenza Inj Mdck Quad Pf 12/06/2017   Influenza, High Dose Seasonal PF 01/03/2020   Influenza,inj,Quad PF,6+ Mos 01/11/2014, 12/22/2015, 12/16/2016   Influenza-Unspecified 12/06/2017, 01/27/2021   PFIZER(Purple Top)SARS-COV-2 Vaccination 06/09/2019, 06/29/2019, 01/17/2020   PNEUMOCOCCAL CONJUGATE-20 02/19/2021   Pfizer Covid-19 Vaccine Bivalent Booster 87yr & up 01/28/2021   Pneumococcal Conjugate-13 12/23/2015   Pneumococcal Polysaccharide-23 12/16/2016  Zoster Recombinat (Shingrix) 11/01/2018, 03/20/2019    TDAP status: Due, Education has been provided regarding the importance of this vaccine. Advised may receive this vaccine at local pharmacy or Health Dept. Aware to provide a copy of the vaccination record if obtained from local pharmacy or Health Dept. Verbalized acceptance and understanding.  Flu Vaccine status: Up to date  Pneumococcal vaccine status: Up to date  Covid-19 vaccine status: Completed vaccines  Qualifies for Shingles Vaccine? Yes   Zostavax completed Yes   Shingrix Completed?: Yes  Screening Tests Health Maintenance  Topic Date Due   TETANUS/TDAP  Never done   OPHTHALMOLOGY EXAM  07/21/2018   COVID-19 Vaccine (5 - Pfizer risk series) 03/25/2021   INFLUENZA VACCINE  10/20/2021   FOOT EXAM  02/19/2022   HEMOGLOBIN A1C  04/08/2022   COLONOSCOPY (Pts 45-57yr Insurance coverage will need  to be confirmed)  05/24/2022   MAMMOGRAM  08/21/2022   Pneumonia Vaccine 66 Years old  Completed   DEXA SCAN  Completed   Hepatitis C Screening  Completed   Zoster Vaccines- Shingrix  Completed   HPV VACCINES  Aged Out    Health Maintenance  Health Maintenance Due  Topic Date Due   TETANUS/TDAP  Never done   OPHTHALMOLOGY EXAM  07/21/2018   COVID-19 Vaccine (5 - Pfizer risk series) 03/25/2021   INFLUENZA VACCINE  10/20/2021    Colorectal cancer screening: Type of screening: Colonoscopy. Completed 05/23/2017. Repeat every 5 years  Mammogram status: Completed 08/20/2021. Repeat every year  Bone Density status: Completed 07/18/2020.   Lung Cancer Screening: (Low Dose CT Chest recommended if Age 68-80years, 30 pack-year currently smoking OR have quit w/in 15years.) does not qualify.   Lung Cancer Screening Referral: no  Additional Screening:  Hepatitis C Screening: does qualify; Completed 10/06/2021  Vision Screening: Recommended annual ophthalmology exams for early detection of glaucoma and other disorders of the eye. Is the patient up to date with their annual eye exam?  No  Who is the provider or what is the name of the office in which the patient attends annual eye exams? none If pt is not established with a provider, would they like to be referred to a provider to establish care? No .   Dental Screening: Recommended annual dental exams for proper oral hygiene  Community Resource Referral / Chronic Care Management: CRR required this visit?  No   CCM required this visit?  No      Plan:     I have personally reviewed and noted the following in the patient's chart:   Medical and social history Use of alcohol, tobacco or illicit drugs  Current medications and supplements including opioid prescriptions. Patient is not currently taking opioid prescriptions. Functional ability and status Nutritional status Physical activity Advanced directives List of other  physicians Hospitalizations, surgeries, and ER visits in previous 12 months Vitals Screenings to include cognitive, depression, and falls Referrals and appointments  In addition, I have reviewed and discussed with patient certain preventive protocols, quality metrics, and best practice recommendations. A written personalized care plan for preventive services as well as general preventive health recommendations were provided to patient.     NKellie Simmering LPN   [id number]  Nurse Notes: none  Due to this being a virtual visit, the after visit summary with patients personalized plan was offered to patient via mail or my-chart.  to pick up at office at next visit

## 2021-12-01 NOTE — Patient Instructions (Signed)
Ms. Gedeon , Thank you for taking time to come for your Medicare Wellness Visit. I appreciate your ongoing commitment to your health goals. Please review the following plan we discussed and let me know if I can assist you in the future.   Screening recommendations/referrals: Colonoscopy: completed 05/23/2017, due 05/24/2022 Mammogram: completed 08/20/2021, due 08/22/2022 Bone Density: completed 07/18/2020 Recommended yearly ophthalmology/optometry visit for glaucoma screening and checkup Recommended yearly dental visit for hygiene and checkup  Vaccinations: Influenza vaccine: due Pneumococcal vaccine: completed 02/19/2021 Tdap vaccine: due Shingles vaccine: completed   Covid-19: 01/28/2021, 01/17/2020, 06/29/2019, 06/09/2019  Advanced directives: copy in chart  Conditions/risks identified: none  Next appointment: Follow up in one year for your annual wellness visit    Preventive Care 68 Years and Older, Female Preventive care refers to lifestyle choices and visits with your health care provider that can promote health and wellness. What does preventive care include? A yearly physical exam. This is also called an annual well check. Dental exams once or twice a year. Routine eye exams. Ask your health care provider how often you should have your eyes checked. Personal lifestyle choices, including: Daily care of your teeth and gums. Regular physical activity. Eating a healthy diet. Avoiding tobacco and drug use. Limiting alcohol use. Practicing safe sex. Taking low-dose aspirin every day. Taking vitamin and mineral supplements as recommended by your health care provider. What happens during an annual well check? The services and screenings done by your health care provider during your annual well check will depend on your age, overall health, lifestyle risk factors, and family history of disease. Counseling  Your health care provider may ask you questions about your: Alcohol use. Tobacco  use. Drug use. Emotional well-being. Home and relationship well-being. Sexual activity. Eating habits. History of falls. Memory and ability to understand (cognition). Work and work Statistician. Reproductive health. Screening  You may have the following tests or measurements: Height, weight, and BMI. Blood pressure. Lipid and cholesterol levels. These may be checked every 5 years, or more frequently if you are over 68 years old. Skin check. Lung cancer screening. You may have this screening every year starting at age 68 if you have a 30-pack-year history of smoking and currently smoke or have quit within the past 15 years. Fecal occult blood test (FOBT) of the stool. You may have this test every year starting at age 68. Flexible sigmoidoscopy or colonoscopy. You may have a sigmoidoscopy every 5 years or a colonoscopy every 10 years starting at age 68. Hepatitis C blood test. Hepatitis B blood test. Sexually transmitted disease (STD) testing. Diabetes screening. This is done by checking your blood sugar (glucose) after you have not eaten for a while (fasting). You may have this done every 1-3 years. Bone density scan. This is done to screen for osteoporosis. You may have this done starting at age 68. Mammogram. This may be done every 1-2 years. Talk to your health care provider about how often you should have regular mammograms. Talk with your health care provider about your test results, treatment options, and if necessary, the need for more tests. Vaccines  Your health care provider may recommend certain vaccines, such as: Influenza vaccine. This is recommended every year. Tetanus, diphtheria, and acellular pertussis (Tdap, Td) vaccine. You may need a Td booster every 10 years. Zoster vaccine. You may need this after age 68. Pneumococcal 13-valent conjugate (PCV13) vaccine. One dose is recommended after age 68. Pneumococcal polysaccharide (PPSV23) vaccine. One dose is recommended  after age 68. Talk to your health care provider about which screenings and vaccines you need and how often you need them. This information is not intended to replace advice given to you by your health care provider. Make sure you discuss any questions you have with your health care provider. Document Released: 04/04/2015 Document Revised: 11/26/2015 Document Reviewed: 01/07/2015 Elsevier Interactive Patient Education  2017 [employer] Prevention in the Home Falls can cause injuries. They can happen to people of all ages. There are many things you can do to make your home safe and to help prevent falls. What can I do on the outside of my home? Regularly fix the edges of walkways and driveways and fix any cracks. Remove anything that might make you trip as you walk through a door, such as a raised step or threshold. Trim any bushes or trees on the path to your home. Use bright outdoor lighting. Clear any walking paths of anything that might make someone trip, such as rocks or tools. Regularly check to see if handrails are loose or broken. Make sure that both sides of any steps have handrails. Any raised decks and porches should have guardrails on the edges. Have any leaves, snow, or ice cleared regularly. Use sand or salt on walking paths during winter. Clean up any spills in your garage right away. This includes oil or grease spills. What can I do in the bathroom? Use night lights. Install grab bars by the toilet and in the tub and shower. Do not use towel bars as grab bars. Use non-skid mats or decals in the tub or shower. If you need to sit down in the shower, use a plastic, non-slip stool. Keep the floor dry. Clean up any water that spills on the floor as soon as it happens. Remove soap buildup in the tub or shower regularly. Attach bath mats securely with double-sided non-slip rug tape. Do not have throw rugs and other things on the floor that can make you trip. What can I do  in the bedroom? Use night lights. Make sure that you have a light by your bed that is easy to reach. Do not use any sheets or blankets that are too big for your bed. They should not hang down onto the floor. Have a firm chair that has side arms. You can use this for support while you get dressed. Do not have throw rugs and other things on the floor that can make you trip. What can I do in the kitchen? Clean up any spills right away. Avoid walking on wet floors. Keep items that you use a lot in easy-to-reach places. If you need to reach something above you, use a strong step stool that has a grab bar. Keep electrical cords out of the way. Do not use floor polish or wax that makes floors slippery. If you must use wax, use non-skid floor wax. Do not have throw rugs and other things on the floor that can make you trip. What can I do with my stairs? Do not leave any items on the stairs. Make sure that there are handrails on both sides of the stairs and use them. Fix handrails that are broken or loose. Make sure that handrails are as long as the stairways. Check any carpeting to make sure that it is firmly attached to the stairs. Fix any carpet that is loose or worn. Avoid having throw rugs at the top or bottom of the stairs. If you do  have throw rugs, attach them to the floor with carpet tape. Make sure that you have a light switch at the top of the stairs and the bottom of the stairs. If you do not have them, ask someone to add them for you. What else can I do to help prevent falls? Wear shoes that: Do not have high heels. Have rubber bottoms. Are comfortable and fit you well. Are closed at the toe. Do not wear sandals. If you use a stepladder: Make sure that it is fully opened. Do not climb a closed stepladder. Make sure that both sides of the stepladder are locked into place. Ask someone to hold it for you, if possible. Clearly mark and make sure that you can see: Any grab bars or  handrails. First and last steps. Where the edge of each step is. Use tools that help you move around (mobility aids) if they are needed. These include: Canes. Walkers. Scooters. Crutches. Turn on the lights when you go into a dark area. Replace any light bulbs as soon as they burn out. Set up your furniture so you have a clear path. Avoid moving your furniture around. If any of your floors are uneven, fix them. If there are any pets around you, be aware of where they are. Review your medicines with your doctor. Some medicines can make you feel dizzy. This can increase your chance of falling. Ask your doctor what other things that you can do to help prevent falls. This information is not intended to replace advice given to you by your health care provider. Make sure you discuss any questions you have with your health care provider. Document Released: 01/02/2009 Document Revised: 08/14/2015 Document Reviewed: 04/12/2014 Elsevier Interactive Patient Education  2017 [employer].

## 2021-12-02 ENCOUNTER — Other Ambulatory Visit: Payer: Self-pay | Admitting: Family Medicine

## 2021-12-02 ENCOUNTER — Telehealth: Payer: Self-pay | Admitting: Pharmacy Technician

## 2021-12-02 DIAGNOSIS — Z596 Low income: Secondary | ICD-10-CM

## 2021-12-02 NOTE — Progress Notes (Signed)
[place] [facility])                                            [place] Team    12/02/2021  Emma [name] 1955-05-05 [id number]  Received both patient and provider portion(s) of patient assistance application(s) for [place]. Faxed completed application and required documents into [employer] and [employer] respectively.   Emma [name] P. Emma [name], [place]  [phone]

## 2021-12-02 NOTE — Telephone Encounter (Signed)
[facility] is requesting to fill pt mir[facility]. Please advise [facility]

## 2021-12-08 ENCOUNTER — Ambulatory Visit: Payer: Self-pay

## 2021-12-08 NOTE — Patient Outreach (Signed)
  Care Coordination   12/08/2021 Name: Emma [name] MRN: [id number] DOB: 1955/09/02   Care Coordination Outreach Attempts:  An unsuccessful telephone outreach was attempted for a scheduled appointment today.  Follow Up Plan:  Additional outreach attempts will be made to offer the patient care coordination information and services.   Encounter Outcome:  No Answer  Care Coordination Interventions Activated:  No   Care Coordination Interventions:  No, not indicated    Barb Merino, RN, BSN, CCM Care Management Coordinator [facility] Management  Direct Phone: [phone]

## 2021-12-10 ENCOUNTER — Telehealth: Payer: Self-pay | Admitting: Pharmacy Technician

## 2021-12-10 DIAGNOSIS — Z596 Low income: Secondary | ICD-10-CM

## 2021-12-10 NOTE — Progress Notes (Signed)
Emma [name])                                            [place] Team    12/10/2021  Emma [name] [birth year] [id number]  Care coordination calls placed to [place] in regard to [name] in regard to [name].  Spoke to [place] at [facility] in regard to [name] application and patient was APPROVED 12/02/21-03/21/22. Patient's medication was delivered to her home and patient is aware of her approval.  Spoke to [place] at [place] in regard to [name] application and patient was APPROVED 12/07/21-03/21/22. Patient's medication will be delivered to her home once she calls them to set up her shipment. Patient is aware and was provided phone number to [place] to set up shipment.  [name] P. Reather Steller, [place]  [phone]

## 2021-12-19 ENCOUNTER — Other Ambulatory Visit: Payer: Self-pay | Admitting: Medical

## 2021-12-23 ENCOUNTER — Ambulatory Visit: Payer: Self-pay

## 2021-12-23 NOTE — Patient Outreach (Signed)
  Care Coordination   12/23/2021 Name: ALVIS PULCINI MRN: [id number] DOB: [birth year]   Care Coordination Outreach Attempts:  An unsuccessful telephone outreach was attempted for a scheduled appointment today.  Follow Up Plan:  No further outreach attempts will be made at this time. We have been unable to contact the patient to offer or enroll patient in care coordination services  Encounter Outcome:  Pt. Refused  Care Coordination Interventions Activated:  No   Care Coordination Interventions:  No, not indicated    Barb Merino, RN, BSN, CCM Care Management Coordinator [facility] Direct Phone: [phone]

## 2021-12-31 ENCOUNTER — Other Ambulatory Visit: Payer: Self-pay | Admitting: Medical

## 2021-12-31 ENCOUNTER — Telehealth: Payer: Self-pay | Admitting: Pharmacist

## 2021-12-31 NOTE — Progress Notes (Signed)
[place] [facility])  [place] Team    12/31/2021  SHAKILA MAK 30-Sep-1955 [id number]  Reason for referral: Medication Assistance  Referral source:  2024 Re-enrollment for [name] and [name] Current insurance:  [employer] REACH   Outreach:  Successful telephone call with Ms. Hoorain Kozakiewicz.  HIPAA identifiers verified.   Objective: The ASCVD Risk score (Arnett DK, et al., 2019) failed to calculate for the following reasons:   The valid total cholesterol range is 130 to 320 mg/dL  Lab Results  Component Value Date   CREATININE 1.73 (H) 04/26/2021   CREATININE 1.22 (H) 02/19/2021   CREATININE 1.03 (H) 01/28/2020    Lab Results  Component Value Date   HGBA1C 6.8 (H) 10/06/2021    Lipid Panel     Component Value Date/Time   CHOL 116 02/19/2021 1219   TRIG 104 02/19/2021 1219   HDL 59 02/19/2021 1219   CHOLHDL 2.0 02/19/2021 1219   CHOLHDL 4.0 12/16/2016 1244   VLDL 36 (H) 07/17/2015 0001   LDLCALC 38 02/19/2021 1219   LDLCALC 94 12/16/2016 1244    BP Readings from Last 3 Encounters:  10/06/21 120/70  10/06/21 134/72  08/03/21 130/62    Allergies  Allergen Reactions   Bee Venom Anaphylaxis   Farxiga [Dapagliflozin]     Horrible yeast infection    Assessment: Received telephone outreach from Ms. Mcallister on 12/31/2021. Ms. Voong reports that Dr. Theador Hawthorne has restarted her on [name] 10 mg PO daily and [name] 10 mg PO daily. She inquired on setting up a shipment for the [name] through [employer]. I provided patient with the phone number to call to set up her shipment, [phone].   Screened patient for interest in re-enrolling for the 2024 year and patient wants to proceed. Reviewed reapplication process and the need for her [place] Benefits letter.   Extra Help:  Not eligible for Extra Help Low Income Subsidy based on reported income and assets  Patient Assistance Programs: [name]  made by [employer] requirement met:  Yes Out-of-pocket prescription expenditure met:   Not Applicable Patient has met application requirements to apply for this program.    [name] made by [employer] requirement met: Yes Out-of-pocket prescription expenditure met:   Not Applicable Patient has met application requirements to apply for this program.    Plan: I will route patient assistance letter to [place] technician who will coordinate patient assistance program application process for medications listed above.  [facility] pharmacy technician will assist with obtaining all required documents from both patient and provider(s) and submit application(s) once completed.   Loretha Brasil, PharmD [place] Pharmacist Office: [phone]

## 2022-01-12 ENCOUNTER — Telehealth: Payer: Self-pay | Admitting: Pharmacist

## 2022-01-12 NOTE — Progress Notes (Signed)
[place] [facility])                                            [place] Team    01/12/2022  ROTHA CASSELS 1956/01/02 [id number]  Returned call to Mrs. [place] identifiers confirmed. Patient left a voice mail on my work mobile asking how to reorder her [place] shipment. I explained to patient that she has to be the one to call and set up the shipment. Provided the phone number for Ms. Hollabaugh to call in order to set up shipment through [employer] patient assistance for [name]. [phone]. Patient was appreciative of my call.   Loretha Brasil, PharmD [place] Pharmacist Office: [phone]

## 2022-01-13 ENCOUNTER — Telehealth: Payer: Self-pay | Admitting: Pharmacist

## 2022-01-13 ENCOUNTER — Telehealth: Payer: Self-pay | Admitting: Pharmacy Technician

## 2022-01-13 DIAGNOSIS — Z596 Low income: Secondary | ICD-10-CM

## 2022-01-13 NOTE — Progress Notes (Signed)
[place] [facility])                                            [place] Team    01/13/2022  Emma [name] [birth year] [id number]                                      Medication Assistance Referral-FOR 2024 RE ENROLLMENT  Referral From: [place]  Medication/Company: Iona Hansen Patient application portion:  Mailed Provider application portion: Faxed  to Dr. Nadara Mode Provider address/fax verified via: Office website  Medication/Company: [name] / [employer] Patient application portion:  Mailed Provider application portion: Faxed  to Dr. Ulice Bold Provider address/fax verified via: Office website   Candance Bohlman P. Jeidy Hoerner, [place]  [phone]

## 2022-01-13 NOTE — Progress Notes (Signed)
[facility])                                            [place] Team    01/13/2022  Emma [name] 26-May-1955 [id number]  Received telephone call from [name], she has [facility] that [name] and [name] are ready for her to pick up and this has caused her some confusion.  Patient is receiving both [name] and [name] through manufacturer patient assistance programs.   I placed a three way telephone call to [place] to arrange a shipment of [name]. Per representative patient will receive first shipment Tuesday 01/20/2022. Enrollment in the program expires 03/21/2022. The re-enrollment process is being done through an attestation form. Patient prefers that [name] her the attestation form. I will print it out to be included with her re-enrollment package. Automatic refills are not part of the program, however [employer] sets an alarm to call patient about 10 days prior to when patient should be out of the medication she will receive a call from [employer]. Patient may refill [name] around 03/18/2022.   Placed a three way call to [place] with patient on the line to order [name]. Per representative, patient is not due to refill [name] until 02/19/2022. Representative stated that we can call 10 days prior to place the order. We were unable to set up automatic refills. I will set a reminder to call patient on or around 02/15/2022.   Lastly, I called [facility] and updated them that patient is approved for patient assistance for both [name] and [name], [facility] has inactivated the prescriptions for both at this time per patient's request.   Loretha Brasil, PharmD [place] Pharmacist Office: [phone]

## 2022-01-29 ENCOUNTER — Ambulatory Visit: Payer: Medicare Other | Admitting: Internal Medicine

## 2022-01-29 NOTE — Progress Notes (Deleted)
Cardiology Office Note:    Date:  01/29/2022   ID:  MYAN LOCATELLI, DOB 03/17/1956, MRN [id number]  PCP:  Carlena Hurl, PA-C   [facility] Providers Cardiologist:  Werner Lean, MD     Referring MD: Carlena Hurl, PA-C   CC:  PAD f/u   History of Present Illness:    Emma [name] is a 66 y.o. female with a hx of HTN with DM, LVH, who presents for evaluation 03/27/21.  Had Normal Lexiscan, PR concerns on echo but normal on CMR.Seen 08/01/21. Saw Dr. Donalda Ewings in interim.  Deferred VVS f/u.  Seeing neprology who is managing her diuretics.  Patient notes that she is doing ***.   Since last visit notes *** . There are no*** interval hospital/ED visit.    No chest pain or pressure ***.  No SOB/DOE*** and no PND/Orthopnea***.  No weight gain or leg swelling***.  No palpitations or syncope ***.  Ambulatory blood pressure ***.    Past Medical History:  Diagnosis Date   Chronic constipation    Cluster headache syndrome 04/27/13   Colon polyp 06/11/2011   Most recent 04/23/08, no polyp--mild diverticulosis.  Repeat 10 yrs.   Depression with anxiety 02/22/2008   Qualifier: Diagnosis of  By: Nolon Rod CMA (AAMA), Robin     Diabetes mellitus without complication ([place])    Diverticulosis    GERD (gastroesophageal reflux disease) 06/11/2011   Hypertension    Recently diagnosed   Migraine syndrome    Osteopenia 11/2016   bone density. plan repeat in 11/2018   Overweight(278.02) 06/11/2011   PUD (peptic ulcer disease) 04/23/08   Multiple antrum ulcers; h pylori neg   Rheumatoid arthritis(714.0)     Past Surgical History:  Procedure Laterality Date   ABDOMINAL HYSTERECTOMY     APPENDECTOMY     BACK SURGERY     3 times   CARDIOVASCULAR STRESS TEST  06/22/2011   Normal; EF 65%   CHOLECYSTECTOMY     COLONOSCOPY  04/23/2008   mild diverticulosis, internal hemorrhoids; Dr. Erskine Emery   ENDOVENOUS ABLATION SAPHENOUS VEIN W/ LASER Left 06/07/2019   endovenous laser  ablation left greater saphenous vein and stab phlebectomy < 10 incisions left leg by Gae Gallop MD   ENDOVENOUS ABLATION SAPHENOUS VEIN W/ LASER Right 12/27/2019   eyelid tightening Bilateral    09/2021   LIVER BIOPSY  03/2007   Chronic active hepatitis w/fibrosis--related to hx of methotrexate treatment.   oral implants  2022   SKIN BIOPSY  08/16/2017   superficial basal cell carcinoma left inferior medial anterior tibia     Current Medications: No outpatient medications have been marked as taking for the 01/29/22 encounter (Appointment) with Werner Lean, MD.     Allergies:   Bee venom and Wilder Glade [dapagliflozin]   Social History   Socioeconomic History   Marital status: Married    Spouse name: Not on file   Number of children: 1   Years of education: Not on file   Highest education level: Not on file  Occupational History   Occupation: retired on disability  Tobacco Use   Smoking status: Never   Smokeless tobacco: Never  Vaping Use   Vaping Use: Never used  Substance and Sexual Activity   Alcohol use: Not Currently   Drug use: No   Sexual activity: Not Currently    Partners: Male  Other Topics Concern   Not on file  Social History Narrative   Married, caregiver,  limited exercise.   01/2018.   Social Determinants of Health   Financial Resource Strain: Low Risk  (12/01/2021)   Overall Financial Resource Strain (CARDIA)    Difficulty of Paying Living Expenses: Not hard at all  Food Insecurity: No Food Insecurity (12/01/2021)   Hunger Vital Sign    Worried About Running Out of Food in the Last Year: Never true    Ran Out of Food in the Last Year: Never true  Transportation Needs: No Transportation Needs (12/01/2021)   PRAPARE - Hydrologist (Medical): No    Lack of Transportation (Non-Medical): No  Physical Activity: Inactive (12/01/2021)   Exercise Vital Sign    Days of Exercise per Week: 0 days    Minutes of Exercise per  Session: 0 min  Stress: No Stress Concern Present (12/01/2021)   [place]    Feeling of Stress : Not at all  Social Connections: Not on file    Social: Has lost a lost of people I the past two years.  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 Darreld Mclean  Family History: The patient's family history includes Alcohol abuse in her sister; Arthritis in her father; Atrial fibrillation in her father; Cancer in her sister; Colon cancer in her paternal uncle; Diabetes in her mother; Heart disease in her maternal grandfather, maternal grandmother, and sister; Hypertension in her brother, father, and sister; Polycystic ovary syndrome in her daughter; Pulmonary embolism in her sister; Stroke in her mother. There is no history of Esophageal cancer or Stomach cancer. Sister had MI related to illegal substance Father had PPM  ROS:   Please see the history of present illness.     All other systems reviewed and are negative.  EKGs/Labs/Other Studies Reviewed:    The following studies were reviewed today:  EKG:   03/27/21: SR rate 63 WNL  Transthoracic Echocardiogram: Date: 01/23/14 Results: - Left ventricle: The cavity size was normal. Wall thickness was    increased in a pattern of moderate LVH. Systolic function was    normal. The estimated ejection fraction was in the range of 55%    to 60%.   Recent Labs: 04/26/2021: ALT 18; BUN 32; Creatinine, Ser 1.73; Hemoglobin 12.3; Platelets 258; Potassium 4.1; Sodium 139  Recent Lipid Panel    Component Value Date/Time   CHOL 116 02/19/2021 1219   TRIG 104 02/19/2021 1219   HDL 59 02/19/2021 1219   CHOLHDL 2.0 02/19/2021 1219   CHOLHDL 4.0 12/16/2016 1244   VLDL 36 (H) 07/17/2015 0001   LDLCALC 38 02/19/2021 1219   LDLCALC 94 12/16/2016 1244    Physical Exam:    VS:  There were no vitals taken for this visit.    Wt Readings from Last 3 Encounters:   12/01/21 227 lb (103 kg)  10/06/21 239 lb 9.6 oz (108.7 kg)  10/06/21 237 lb 9.6 oz (107.8 kg)    Gen: No distress, Morbid obesity   Neck: No JVD Cardiac: No Rubs or Gallops, no murmur, regular rhythm +2 radial pulses Respiratory: Clear to auscultation bilaterally, normal effort, normal  respiratory rate GI: Soft, nontender, non-distended  MS: +1 bilateral LE edema painful to touch;  moves all extremities Integument: Skin feels warm Neuro:  At time of evaluation, alert and oriented to person/place/time/situation  Psych: Depressed mood and affect  ASSESSMENT:    No diagnosis found.  PLAN:    Leg Pain, claudication  and swelling History of venous insufficieny HTN with DM Mixed HLD CKD IIIa LE edema - will get ABIs and Arterial and venous duplex - if negative will re-refer to VVS (saw Scot Dock in the past) for potential intervention  - no change to ACEI, lasix 20 mg PO daily, and rosuvastatin   Six months         Medication Adjustments/Labs and Tests Ordered: Current medicines are reviewed at length with the patient today.  Concerns regarding medicines are outlined above.  No orders of the defined types were placed in this encounter.  No orders of the defined types were placed in this encounter.   There are no Patient Instructions on file for this visit.   Signed, Werner Lean, MD  01/29/2022 12:54 PM    [place]

## 2022-02-08 ENCOUNTER — Ambulatory Visit: Payer: Medicare Other | Admitting: Medical

## 2022-02-18 ENCOUNTER — Telehealth: Payer: Self-pay | Admitting: Pharmacist

## 2022-02-18 NOTE — Progress Notes (Signed)
[place] [facility]) [name]   02/18/2022  Emma [name] [birth year] [id number]  Reason for referral: Medication Assistance  Referral source:  Patient  Current insurance: Traditional Medicare  Reason for call: Place refill for [name] through patient assistance company, [employer]   Outreach:  Unsuccessful telephone call attempt #2  to patient.  Initial attempt on 02/15/2022, left VM. HIPAA compliant voicemail left requesting a return call  Plan:  -I will make another outreach attempt to patient within 3-4 business days.  Thank you for allowing [facility] pharmacy to be a part of this patient's care.   Loretha Brasil, PharmD [place] Pharmacist Office: [phone]

## 2022-02-20 ENCOUNTER — Other Ambulatory Visit: Payer: Self-pay | Admitting: Medical

## 2022-02-22 ENCOUNTER — Telehealth: Payer: Self-pay | Admitting: Pharmacist

## 2022-02-22 NOTE — Progress Notes (Signed)
[place] [facility])                                            [place] Team    02/22/2022  CARLENA RUYBAL 1955-05-23 [id number]  Ms. Vanrossum called me regarding the return of her 2024 re-enrollment patient assistance application. She was driving and will call me back tomorrow to place the order for her [name] for the end of 2024.   Loretha Brasil, PharmD [place] Pharmacist Office: [phone]

## 2022-02-23 ENCOUNTER — Telehealth: Payer: Self-pay | Admitting: Pharmacist

## 2022-02-23 NOTE — Progress Notes (Signed)
[place] [facility])                                            [place] Team    02/23/2022  Emma [name] 03-07-1956 [id number]  Spoke with [name] via telephone today, the prescription number she has provided me is a 7 digit number and BI requires a 10 digit number. Permission to place a refill request granted by [name].   Placed telephonic outreach to [facility] on behalf of [name]. Placed request for a refill on [name] 10 mg. Spoke with representative Alisha. Shipment should reach patient in the next 5 to 7 business days.   I will follow up with [name] at the end of December to place a refill request for [name] through [employer].   Loretha Brasil, PharmD [name] Pharmacist Office: [phone]

## 2022-03-08 ENCOUNTER — Encounter: Payer: Self-pay | Admitting: Medical

## 2022-03-08 ENCOUNTER — Telehealth: Payer: Self-pay | Admitting: Pharmacy Technician

## 2022-03-08 ENCOUNTER — Telehealth (INDEPENDENT_AMBULATORY_CARE_PROVIDER_SITE_OTHER): Payer: Medicare Other | Admitting: Medical

## 2022-03-08 VITALS — Temp 97.3°F | Ht 67.0 in | Wt 224.0 lb

## 2022-03-08 DIAGNOSIS — R051 Acute cough: Secondary | ICD-10-CM

## 2022-03-08 DIAGNOSIS — Z596 Low income: Secondary | ICD-10-CM

## 2022-03-08 DIAGNOSIS — R062 Wheezing: Secondary | ICD-10-CM

## 2022-03-08 DIAGNOSIS — J988 Other specified respiratory disorders: Secondary | ICD-10-CM

## 2022-03-08 MED ORDER — ALBUTEROL SULFATE HFA 108 (90 BASE) MCG/ACT IN AERS: 2.0000 | INHALATION_SPRAY | Freq: Four times a day (QID) | RESPIRATORY_TRACT | 0 refills | Status: DC | PRN

## 2022-03-08 MED ORDER — AZITHROMYCIN 250 MG PO TABS: ORAL_TABLET | ORAL | 0 refills | Status: DC

## 2022-03-08 MED ORDER — HYDROCODONE BIT-HOMATROP MBR 5-1.5 MG/5ML PO SOLN: 5.0000 mL | Freq: Three times a day (TID) | ORAL | 0 refills | Status: AC | PRN

## 2022-03-08 NOTE — Progress Notes (Signed)
Subjective:     Patient ID: Emma [name], female   DOB: 07/23/1955, 66 y.o.   MRN: [id number]  This visit type was conducted due to national recommendations for restrictions regarding the COVID-19 Pandemic (e.g. social distancing) in an effort to limit this patient's exposure and mitigate transmission in our community.  Due to their co-morbid illnesses, this patient is at least at moderate risk for complications without adequate follow up.  This format is felt to be most appropriate for this patient at this time.    Documentation for virtual audio and video telecommunications through [place] encounter:  The patient was located at home. The provider was located in the office. The patient did consent to this visit and is aware of possible charges through their insurance for this visit.  The other persons participating in this telemedicine service were none. Time spent on call was 20 minutes and in review of previous records 20 minutes total.  This virtual service is not related to other E/M service within previous 7 days.   HPI Chief Complaint  Patient presents with   other    Bad cough since last Monday covid test negative, horrible body aches hurts laying down had fever for a couple hours, abdominal muscles hurting from coughin so much, did hae chills for four days, no appetite,    Virtual consult for cough.  Been having cough x 1 week, having some wheezing x 1 days.   Coughing so much having to hold stomach due to sore muscles.   Had home covid test that was negative.  Has had some chills for several days, body aches.  Had low grade fever initially for a few hours.  Has some headache from coughing.   No sore throat.  No NVD.   Using mucinex cold and cough.  No sick contacts. Doing ok on fluid intake.  No appetite.  No other aggravating or relieving factors. No other complaint.   Past Medical History:  Diagnosis Date   Chronic constipation    Cluster headache syndrome 04/27/13    Colon polyp 06/11/2011   Most recent 04/23/08, no polyp--mild diverticulosis.  Repeat 10 yrs.   Depression with anxiety 02/22/2008   Qualifier: Diagnosis of  By: Nolon Rod CMA (AAMA), Robin     Diabetes mellitus without complication ([place])    Diverticulosis    GERD (gastroesophageal reflux disease) 06/11/2011   Hypertension    Recently diagnosed   Migraine syndrome    Osteopenia 11/2016   bone density. plan repeat in 11/2018   Overweight(278.02) 06/11/2011   PUD (peptic ulcer disease) 04/23/08   Multiple antrum ulcers; h pylori neg   Rheumatoid arthritis(714.0)    Current Outpatient Medications on File Prior to Visit  Medication Sig Dispense Refill   acetaminophen (TYLENOL) 500 MG tablet Take 1,500 mg by mouth once as needed for mild pain or moderate pain.      ALPRAZolam (XANAX) 0.5 MG tablet TAKE 1 TABLET BY MOUTH EVERYDAY AT BEDTIME 30 tablet 0   BIOTIN PO Take 1 capsule by mouth daily.      busPIRone (BUSPAR) 7.5 MG tablet Take 1 tablet (7.5 mg total) by mouth 2 (two) times daily. 180 tablet 1   calcitRIOL (ROCALTROL) 0.25 MCG capsule Take 0.25 mcg by mouth 3 (three) times a week.     calcium carbonate ([employer] CALCIUM) 1500 (600 Ca) MG TABS tablet TAKE 1 TABLET (600 MG TOTAL) BY MOUTH 2 (TWO) TIMES DAILY WITH A MEAL. 150 tablet 1   carisoprodol (  SOMA) 350 MG tablet TAKE 1 TABLET BY MOUTH TWICE A DAY AND AT BEDTIME AS NEEDED FOR MUSCLE SPASMS  0   dicyclomine (BENTYL) 10 MG capsule Take 1-2 capsules (10-20 mg total) by mouth every 8 (eight) hours as needed for spasms. 30 capsule 1   furosemide (LASIX) 20 MG tablet Take 1 tablet (20 mg total) by mouth daily. 90 tablet 3   gabapentin (NEURONTIN) 300 MG capsule TAKE 1 CAPSULE BY MOUTH THREE TIMES A DAY AS NEEDED 270 capsule 2   [name] 10 MG TABS tablet Take 10 mg by mouth every morning.     [name] 10 MG TABS Take 1 tablet by mouth daily.     lisinopril (ZESTRIL) 30 MG tablet Take 30 mg by mouth daily.     Lubiprostone (AMITIZA PO) Take by  mouth. Take 1-2 tablets as needed for conspitation     lubiprostone (AMITIZA) 24 MCG capsule Take 1 capsule (24 mcg total) by mouth 2 (two) times daily with a meal. 180 capsule 0   metFORMIN (GLUCOPHAGE) 500 MG tablet TAKE 1 TABLET BY MOUTH EVERY DAY WITH BREAKFAST 90 tablet 1   mirtazapine (REMERON) 15 MG tablet TAKE 1/2 OR 1 TABLET BY MOUTH AT BEDTIME 90 tablet 1   Multiple Vitamins-Iron (MULTIVITAMINS WITH IRON) TABS tablet Take 1 tablet by mouth daily. 90 tablet 0   omeprazole (PRILOSEC) 40 MG capsule TAKE 1 CAPSULE BY MOUTH EVERY DAY 90 capsule 0   rosuvastatin (CRESTOR) 20 MG tablet Take 1 tablet (20 mg total) by mouth daily. 90 tablet 3   verapamil (CALAN-SR) 240 MG CR tablet TAKE 1 TABLET BY MOUTH EVERYDAY AT BEDTIME 90 tablet 1   No current facility-administered medications on file prior to visit.    Review of Systems As in subjective    Objective:   Physical Exam Due to coronavirus pandemic stay at home measures, patient visit was virtual and they were not examined in person.   Temp (!) 97.3 F (36.3 C)   Ht '5\' 7"'$  (1.702 m)   Wt 224 lb (101.6 kg)   BMI 35.08 kg/m   Gen: coughing  a lot, deep cough No labored breathing or wheezing during interview      Assessment:     Encounter Diagnoses  Name Primary?   Acute cough Yes   Wheezing    Respiratory tract infection        Plan:     We discussed limitations of virtual consult  I recommended a chest x-ray.  She wants to hold off for now  Given the timeframe and severity of her symptoms we will go ahead and begin the medications below along with rest, hydration, and she can continue Mucinex over-the-counter alternating by at least 3 to 4 hours with the Hycodan syrup  She is not taking her fluid pill temporarily.  I recommend she cut lisinopril and [name] to half tablet daily for the next 3 days to help with hydration and not getting dehydrated and avoiding kidney injury  I asked her to get a blood pressure and  pulse and call us back with those details.  She had to go get new batteries for her machine so she will call us back with that information  Advised if worse in the next few days or not improving to consider recheck or go to the emergency department for evaluation  Kendyl was seen today for other.  Diagnoses and all orders for this visit:  Acute cough  Wheezing  Respiratory  tract infection  Other orders -     albuterol (VENTOLIN HFA) 108 (90 Base) MCG/ACT inhaler; Inhale 2 puffs into the lungs every 6 (six) hours as needed for wheezing or shortness of breath. -     azithromycin (ZITHROMAX) 250 MG tablet; 2 tablets day 1, then 1 tablet days 2-4 -     HYDROcodone bit-homatropine (HYCODAN) 5-1.5 MG/5ML syrup; Take 5 mLs by mouth every 8 (eight) hours as needed for up to 8 days for cough.  F/u with call back on vitals

## 2022-03-08 NOTE — Progress Notes (Addendum)
[place] [facility])                                            [place] Team    03/08/2022  Emma [name] [birth year] [id number]  Received ONLY patient and provider portion(s) of patient assistance application(s) for [name]. Faxed completed application and required documents into [employer]. Refaxed provider again for [name] with [employer]. Also, [facility] PharmD to call patient to inquire about her portion of the [facility] application.   Demetrice Combes P. Reichen Hutzler, [name]  [phone]

## 2022-03-09 ENCOUNTER — Telehealth: Payer: Self-pay | Admitting: Pharmacist

## 2022-03-09 NOTE — Progress Notes (Signed)
[place] [facility]) [place]   03/09/2022  Emma [name] 08-Mar-1956 [id number]  Reason for referral:  Follow up for [name] patient assistance   Referral source:  Susy Frizzle, CPhT  Current insurance: Traditional Medicare  Reason for call: Inquiry about patient portion of [name] application   Outreach:  Unsuccessful telephone call attempt #1 to patient.   HIPAA compliant voicemail left requesting a return call  Plan:  -I will make another outreach attempt to patient within 3-4 business days.  Thank you for allowing [facility] pharmacy to be a part of this patient's care.    Loretha Brasil, PharmD [place] Pharmacist Office: [phone]

## 2022-03-18 ENCOUNTER — Telehealth: Payer: Self-pay | Admitting: Pharmacist

## 2022-03-18 NOTE — Progress Notes (Signed)
[place] [facility])                                            [place] Team    03/18/2022  MEGIN CONSALVO 02-04-1956 [id number]  Received return call from Mrs. Shanda Howells, HIPAA identifiers were confirmed. Patient requests for a new application for [name] to be mailed to her for the 2024 re-enrollment. Advised patient that I will inform Sharee Pimple [place], CPhT to mail a new application next week.   Placed a three way call to [employer] patient assistance company for a refill request. Representative confirmed shipment to arrive to patient on January 4th 2024.   Advised patient that Sharee Pimple or I will be in touch regarding her 2024 re-enrollment status. Patient was appreciative of my call.   Loretha Brasil, PharmD [place] Pharmacist Office: [phone]

## 2022-03-18 NOTE — Progress Notes (Signed)
Emma [name] [facility]) [place]   03/18/2022  Emma [name] 10-09-1955 [id number]  Reason for referral:  Follow Up on [name] Re-Enrollment and [name] Refill   Referral source:  Susy Frizzle, CPhT  Current insurance: Traditional Medicare  Reason for call: Placed outreach to inquire if patient will be returning the 2024 re-enrollment application for [name] and to assist with placing the last refill of [name] through [place] patient assistance program with patient. Left voice mail with phone number for patient to call and refill [name]. Patient must request the refill. [phone].   Outreach:  Unsuccessful telephone call attempt #2  to patient.   HIPAA compliant voicemail left requesting a return call  Plan:  -I will make another outreach attempt to patient in 7 to 10 business days.  Thank you for allowing [facility] pharmacy to be a part of this patient's care.   Loretha Brasil, PharmD [place] Pharmacist Office: [phone]

## 2022-03-21 ENCOUNTER — Other Ambulatory Visit: Payer: Self-pay | Admitting: Medical

## 2022-03-26 ENCOUNTER — Telehealth: Payer: Self-pay | Admitting: Pharmacist

## 2022-03-26 NOTE — Progress Notes (Signed)
[place] [facility]) [place]   03/26/2022  SWEET Emma [name] [birth year] [id number]  Reason for referral:  [name] application follow up   Referral source:  Patient  Current insurance: Traditional Medicare  Reason for call: [name] application follow up   Outreach:  Unsuccessful telephone call attempt to patient.   HIPAA compliant VM left with information that [name]  application was re-mailed this week.   Plan:  [place] Team will follow up as needed with pertinent updates to patient regarding her 2024 enrollment.   Thank you for allowing [facility] pharmacy to be a part of this patient's care.

## 2022-03-31 ENCOUNTER — Telehealth: Payer: Self-pay | Admitting: Pharmacy Technician

## 2022-03-31 DIAGNOSIS — Z596 Low income: Secondary | ICD-10-CM

## 2022-03-31 NOTE — Progress Notes (Signed)
[place] [facility])                                            [place] Team    03/31/2022  Emma [name] 12-15-1955 [id number]  Care coordination call palced to [name] in regard to [place] application.  Spoke to [name] who informs patient is APPROVED 03/31/22-03/22/23. Patient will need to call [name] for refills and then the medication will be delivered to her home address on file.  [name] P. Dimples Probus, [place]  [phone]

## 2022-04-02 ENCOUNTER — Other Ambulatory Visit: Payer: Self-pay | Admitting: Internal Medicine

## 2022-04-09 ENCOUNTER — Telehealth: Payer: Self-pay | Admitting: Pharmacy Technician

## 2022-04-09 DIAGNOSIS — Z596 Low income: Secondary | ICD-10-CM

## 2022-04-09 NOTE — Progress Notes (Signed)
[place] [facility])                                            [place] Team    04/09/2022  Emma [name] 29-Sep-1955 [id number]  Received patient  portion(s) of patient assistance application(s) for [name]. Faxed completed application and required documents into BI.  NOTE: Have not received MD part of the BI application for [name] but went ahead and faxed into BI. Refaxed MD BI provider part.  [name] P. [name], [place]  [phone]

## 2022-04-14 ENCOUNTER — Telehealth: Payer: Self-pay | Admitting: Pharmacist

## 2022-04-14 NOTE — Progress Notes (Signed)
[place] [facility])                                            [place] Team    04/14/2022  KYLYN MCDADE 18-Mar-1956 [id number]  Placed follow up telephone call to [facility] to coordinate the provider's portion of [name] application for patient assistance. Spoke with Thayer Headings, she will attempt to e-mail the provider's portion as prior fax attempts have not been successful.   Loretha Brasil, PharmD [place] Clinical Pharmacist Direct Dial: [phone]

## 2022-04-15 ENCOUNTER — Telehealth: Payer: Self-pay | Admitting: Pharmacy Technician

## 2022-04-15 DIAGNOSIS — Z596 Low income: Secondary | ICD-10-CM

## 2022-04-15 NOTE — Progress Notes (Signed)
[place] [facility])                                            [place] Team    04/15/2022  Emma [name] [birth year] [id number]  Received provider portion(s) of patient assistance application(s) for [name]. Faxed completed application and required documents into BI. Previously faxed BI as had received patient portion.   Emma [name] P. Keali Mccraw, [place]  [phone]

## 2022-04-16 ENCOUNTER — Other Ambulatory Visit: Payer: Self-pay | Admitting: Gastroenterology

## 2022-04-27 ENCOUNTER — Encounter: Payer: Self-pay | Admitting: Gastroenterology

## 2022-04-27 ENCOUNTER — Ambulatory Visit (INDEPENDENT_AMBULATORY_CARE_PROVIDER_SITE_OTHER): Payer: Medicare Other | Admitting: Gastroenterology

## 2022-04-27 VITALS — BP 128/80 | HR 65 | Ht 67.0 in | Wt 231.0 lb

## 2022-04-27 DIAGNOSIS — Z86010 Personal history of colonic polyps: Secondary | ICD-10-CM

## 2022-04-27 DIAGNOSIS — R194 Change in bowel habit: Secondary | ICD-10-CM

## 2022-04-27 DIAGNOSIS — K602 Anal fissure, unspecified: Secondary | ICD-10-CM

## 2022-04-27 DIAGNOSIS — K642 Third degree hemorrhoids: Secondary | ICD-10-CM | POA: Diagnosis not present

## 2022-04-27 DIAGNOSIS — R159 Full incontinence of feces: Secondary | ICD-10-CM

## 2022-04-27 DIAGNOSIS — N393 Stress incontinence (female) (male): Secondary | ICD-10-CM

## 2022-04-27 MED ORDER — AMBULATORY NON FORMULARY MEDICATION: 0 refills | Status: AC

## 2022-04-27 MED ORDER — CALMOL-4 76-10 % RE SUPP: RECTAL | 0 refills | Status: AC

## 2022-04-27 MED ORDER — NA SULFATE-K SULFATE-MG SULF 17.5-3.13-1.6 GM/177ML PO SOLN: 1.0000 | Freq: Once | ORAL | 0 refills | Status: AC

## 2022-04-27 MED ORDER — CITRUCEL PO POWD: 1.0000 | Freq: Every day | ORAL | Status: AC

## 2022-04-27 NOTE — Patient Instructions (Addendum)
_______________________________________________________  If your blood pressure at your visit was 140/90 or greater, please contact your primary care physician to follow up on this.  _______________________________________________________  If you are age 69 or older, your body mass index should be between 23-30. Your Body mass index is 36.18 kg/m. If this is out of the aforementioned range listed, please consider follow up with your Primary Care Provider.  If you are age 87 or younger, your body mass index should be between 19-25. Your Body mass index is 36.18 kg/m. If this is out of the aformentioned range listed, please consider follow up with your Primary Care Provider.   ________________________________________________________  The [facility] providers would like to encourage you to use [facility] to communicate with providers for non-urgent requests or questions.  Due to long hold times on the telephone, sending your provider a message by [facility] may be a faster and more efficient way to get a response.  Please allow 48 business hours for a response.  Please remember that this is for non-urgent requests.  _______________________________________________________  Dennis Bast have been scheduled for a colonoscopy. Please follow written instructions given to you at your visit today.  Please pick up your prep supplies at the pharmacy within the next 1-3 days. If you use inhalers (even only as needed), please bring them with you on the day of your procedure.  _________________________________________________________________ We have sent a prescription for diltiazem with lidocaine gel to [facility]. You should apply a pea size amount to your rectum three times daily until healed.  [facility]'s information is below: Address: [address], [place], [place] [address]  [phone]  *Please DO NOT go directly from our office to pick up this medication! Give the pharmacy 1 day  to process the prescription as this is compounded and takes time to make.  __________________________________________________________________  Please purchase the following medications over the counter and take as directed: Fiber supplement such as Citrucel, Benefiber or Metamucil  We have given you samples of the following medication to take: Calmol 4  suppositories - use as directed  We are referring you to [facility].  They will contact you directly to schedule an appointment.  It may take a week or more before you hear from them.  Please feel free to contact us if you have not heard from them within 2 weeks and we will follow up on the referral.    Thank you for entrusting me with your care and for choosing [facility], Dr. Montrose Cellar

## 2022-04-27 NOTE — Progress Notes (Signed)
HPI :  67 year old female here for follow-up visit for altered bowel habits, hemorrhoids, rectal pain.  We also discussed her issues of fecal and urinary incontinence, history of colon polyps.  Recall she had problems with constipation in the past. She has been on a variety of regimens for constipation in the past to include Linzess, Amitiza, MiraLAX, Motegrity, Trulance.  Most recently she has been on Amitiza as needed.  She states her bowels really fluctuate between constipated/hard stools and then will have episodes of loose stools.  She feels that her stools are 50% constipated, 50% loose.  When the stools are loose she has a hard time keeping the stool and if she has urgency and can have fecal leakage and some incontinence.  She also endorses some urinary stress incontinence with leakage when coughing or sneezing etc.  This appears to be ongoing for some time.  I had recommended a daily fiber supplement to her at her last visit but it sounds like she never tried this.  In association with this she has had some hemorrhoids that have bothered her.  She has some swelling and discomfort at times.  Recall she has had grade 3 hemorrhoids in the past.  I performed hemorrhoid banding for her in January 2020.  She states this worked really well to control her symptoms for some time but symptoms have recurred.  She does endorse some rectal pain with bowel movements at times but no significant bleeding.  Recall her last colonoscopy was in March 2019, she had 2 ascending colon polyps removed that were sessile serrated, recall was recommended for 5 years.  Of note she inquires about a protrusion from her abdomen when she sits up.  She has no pain at the site.    Most recent endoscopic evaluation:  EGD 05/23/2017 - normal esophagus, stomach, with brunner's gland hyperplasia of the bulb, bx for HP negative Colonoscopy 05/23/2017 - 2 ascending colon polyps, anal fissure, internal hemorrhoids - path c/w sessile  serrated polyps. Repeat 5 years   CT abdomen / pelvis 03/07/19 - IMPRESSION: Unremarkable exam. No acute findings or other significant abnormality   Gastric emptying study -03/28/19 - normal  MRI abdomen 04/21/21: IMPRESSION: 1. Small, benign bilateral simple renal cysts. 2. No suspicious renal masses or suspicious contrast enhancement. 3. Status post cholecystectomy.    Past Medical History:  Diagnosis Date   Chronic constipation    CKD (chronic kidney disease), stage III (HCC)    Cluster headache syndrome 04/27/2013   Colon polyp 06/11/2011   Most recent 04/23/08, no polyp--mild diverticulosis.  Repeat 10 yrs.   Depression with anxiety 02/22/2008   Qualifier: Diagnosis of  By: Nolon Rod CMA (AAMA), Robin     Diabetes mellitus without complication ([place])    Diverticulosis    GERD (gastroesophageal reflux disease) 06/11/2011   Hypertension    Recently diagnosed   Migraine syndrome    Osteopenia 11/2016   bone density. plan repeat in 11/2018   Overweight(278.02) 06/11/2011   PUD (peptic ulcer disease) 04/23/2008   Multiple antrum ulcers; h pylori neg   Rheumatoid arthritis(714.0)      Past Surgical History:  Procedure Laterality Date   ABDOMINAL HYSTERECTOMY     APPENDECTOMY     BACK SURGERY     3 times   CARDIOVASCULAR STRESS TEST  06/22/2011   Normal; EF 65%   CHOLECYSTECTOMY     COLONOSCOPY  04/23/2008   mild diverticulosis, internal hemorrhoids; Dr. Erskine Emery   ENDOVENOUS ABLATION SAPHENOUS  VEIN W/ LASER Left 06/07/2019   endovenous laser ablation left greater saphenous vein and stab phlebectomy < 10 incisions left leg by Gae Gallop MD   ENDOVENOUS ABLATION SAPHENOUS VEIN W/ LASER Right 12/27/2019   eyelid tightening Bilateral    09/2021   LIVER BIOPSY  03/2007   Chronic active hepatitis w/fibrosis--related to hx of methotrexate treatment.   oral implants  2022   SKIN BIOPSY  08/16/2017   superficial basal cell carcinoma left inferior medial anterior  tibia    Family History  Problem Relation Age of Onset   Diabetes Mother    Stroke Mother    Arthritis Father    Hypertension Father    Atrial fibrillation Father    Alcohol abuse Sister    Cancer Sister        mesothelioma   Heart disease Sister        cardiomegaly   Hypertension Sister    Pulmonary embolism Sister    Hypertension Brother    Polycystic ovary syndrome Daughter    Heart disease Maternal Grandmother    Heart disease Maternal Grandfather    Colon cancer Paternal Uncle    Esophageal cancer Neg Hx    Stomach cancer Neg Hx    Social History   Tobacco Use   Smoking status: Never   Smokeless tobacco: Never  Vaping Use   Vaping Use: Never used  Substance Use Topics   Alcohol use: Not Currently   Drug use: No   Current Outpatient Medications  Medication Sig Dispense Refill   acetaminophen (TYLENOL) 500 MG tablet Take 1,500 mg by mouth once as needed for mild pain or moderate pain.      albuterol (VENTOLIN HFA) 108 (90 Base) MCG/ACT inhaler Inhale 2 puffs into the lungs every 6 (six) hours as needed for wheezing or shortness of breath. 18 g 0   ALPRAZolam (XANAX) 0.5 MG tablet TAKE 1 TABLET BY MOUTH EVERYDAY AT BEDTIME 30 tablet 0   BIOTIN PO Take 1 capsule by mouth daily.      busPIRone (BUSPAR) 7.5 MG tablet Take 1 tablet (7.5 mg total) by mouth 2 (two) times daily. 180 tablet 1   calcitRIOL (ROCALTROL) 0.25 MCG capsule Take 0.25 mcg by mouth 3 (three) times a week.     calcium carbonate ([employer] CALCIUM) 1500 (600 Ca) MG TABS tablet TAKE 1 TABLET (600 MG TOTAL) BY MOUTH 2 (TWO) TIMES DAILY WITH A MEAL. 150 tablet 1   carisoprodol (SOMA) 350 MG tablet TAKE 1 TABLET BY MOUTH TWICE A DAY AND AT BEDTIME AS NEEDED FOR MUSCLE SPASMS  0   dicyclomine (BENTYL) 10 MG capsule Take 1-2 capsules (10-20 mg total) by mouth every 8 (eight) hours as needed for spasms. 30 capsule 1   furosemide (LASIX) 20 MG tablet TAKE 1 TABLET BY MOUTH EVERY DAY 90 tablet 1   gabapentin  (NEURONTIN) 300 MG capsule TAKE 1 CAPSULE BY MOUTH THREE TIMES A DAY AS NEEDED 270 capsule 2   [name] 10 MG TABS tablet Take 10 mg by mouth every morning.     [name] 10 MG TABS Take 1 tablet by mouth daily.     lisinopril (ZESTRIL) 30 MG tablet Take 30 mg by mouth daily.     Lubiprostone (AMITIZA PO) Take by mouth. Take 1-2 tablets as needed for conspitation     lubiprostone (AMITIZA) 24 MCG capsule Take 1 capsule (24 mcg total) by mouth 2 (two) times daily with a meal. 180 capsule 0  metFORMIN (GLUCOPHAGE) 500 MG tablet TAKE 1 TABLET BY MOUTH EVERY DAY WITH BREAKFAST 90 tablet 1   mirtazapine (REMERON) 15 MG tablet TAKE 1/2 OR 1 TABLET BY MOUTH AT BEDTIME 90 tablet 1   Multiple Vitamins-Iron (MULTIVITAMINS WITH IRON) TABS tablet Take 1 tablet by mouth daily. 90 tablet 0   omeprazole (PRILOSEC) 40 MG capsule TAKE 1 CAPSULE BY MOUTH EVERY DAY 90 capsule 0   rosuvastatin (CRESTOR) 20 MG tablet TAKE 1 TABLET BY MOUTH EVERY DAY 90 tablet 1   verapamil (CALAN-SR) 240 MG CR tablet TAKE 1 TABLET BY MOUTH EVERYDAY AT BEDTIME 90 tablet 1   No current facility-administered medications for this visit.   Allergies  Allergen Reactions   Bee Venom Anaphylaxis   Farxiga [Dapagliflozin]     Horrible yeast infection     Review of Systems: All systems reviewed and negative except where noted in HPI.   Lab Results  Component Value Date   WBC 8.7 04/26/2021   HGB 12.3 04/26/2021   HCT 37.6 04/26/2021   MCV 95.7 04/26/2021   PLT 258 04/26/2021    Lab Results  Component Value Date   CREATININE 1.73 (H) 04/26/2021   BUN 32 (H) 04/26/2021   NA 139 04/26/2021   K 4.1 04/26/2021   CL 103 04/26/2021   CO2 26 04/26/2021    Lab Results  Component Value Date   ALT 18 04/26/2021   AST 19 04/26/2021   ALKPHOS 82 04/26/2021   BILITOT 0.3 04/26/2021      Physical Exam: BP 128/80   Pulse 65   Ht '5\' 7"'$  (1.702 m)   Wt 231 lb (104.8 kg)   SpO2 97%   BMI 36.18 kg/m  Constitutional:  Pleasant,well-developed, female in no acute distress. Abdomen - diastasis recti hernia mid upper abdomen, soft, nontender DRE / Anoscopy - CMA Tia Alert as standby - posterior midline anal fissure, no mass lesions, hemorrhoids noted. Normal tone, low squeeze pressure, normal decent Extremities: no edema Neurological: Alert and oriented to person place and time. Skin: Skin is warm and dry. No rashes noted. Psychiatric: Normal mood and affect. Behavior is normal.   ASSESSMENT: 66 y.o. female here for assessment of the following  1. Altered bowel habits   2. Anal fissure   3. Grade III hemorrhoids   4. Incontinence of feces, unspecified fecal incontinence type   5. Urine, incontinence, stress female   6. History of colon polyps    Ongoing issues with altered bowel habits, fluctuating between constipated and loose stools.  Associated with this she has had some inflammation of some hemorrhoids and she has a clear anal fissure in her posterior midline anal canal which is causing her rectal pain.  In addition, she has some incontinence of loose stools when this occurs and on review of systems also has some urinary incontinence.  Her DRE suggests weak pelvic floor.  In light of these issues recommend a daily fiber supplement for her to take, good options would be Citrucel, Benefiber, or Metamucil.  Hopefully this will provide some regularity to her.  She did not take this as recommended at the last visit and discussed this with her, she is willing to try it.  For the anal fissure recommend topical diltiazem lidocaine ointment, pea-sized applied PR 3 times daily for several weeks until healed.  She can also use Calmol 4 suppositories as needed for inflamed hemorrhoids.  She is due for surveillance colonoscopy for history of colon polyps.  We discussed  risk benefits of this and that of anesthesia, she wants to proceed with this and will schedule at the [facility].  In the interim, due to her incontinence issues  and weak pelvic floor on DRE, will refer her to pelvic floor physical therapy to see if that can help her with these issues as well.  Hopefully management of fissure will help her pain and use a fiber supplement can provide some regularity to her bowels and minimize inflammation of hemorrhoids.  Pending her course and colonoscopy, may consider additional hemorrhoid banding if needed if hemorrhoids persistently bother her despite measures taken.  I suspect treatment of anal fissure will improve her rectal symptoms significantly.   PLAN: - start daily fiber supplement - Citrucel, Benefiber, or Metamucil once daily - start diltiazem / lidocaine ointment - pea sized amount PR TID until healed - Calmol4 suppository samples to use PRN - colonoscopy to be schedule at the [facility] for surveillance of polyps - refer to pelvic floor PT - incontinence, weak pelvic floor - consideration for hemorrhoid banding pending her course but needs fissure healed first  Jolly Mango, MD [facility]

## 2022-04-29 ENCOUNTER — Other Ambulatory Visit: Payer: Self-pay | Admitting: Medical

## 2022-04-30 ENCOUNTER — Telehealth: Payer: Self-pay | Admitting: Pharmacy Technician

## 2022-04-30 DIAGNOSIS — Z596 Low income: Secondary | ICD-10-CM

## 2022-04-30 NOTE — Progress Notes (Signed)
[facility])                                            [place] Team    04/30/2022  Emma [name] January 17, 1956 [id number]  Care coordination call placed to BI in regard to [facility] application.   Spoke to [place] who informs patient is APPROVED 04/15/22-03/22/23. She informs patient will need to call in to [facility] at [phone] to re order the medication allowing 10 business days for processing and shipping. She informs there is an issue with the current order and transferred me to [place] at [place]. Spoke to [place] at the pharmacy. He informs the application states to take 1 tablet 3x/week instead of 1 qd. Spoke to [place] and after review of EPIC notes and documentation and patient's diagnosis it was confirmed the dose the MD wrote was correct. Emma [name] verbalized understanding. Patient's initial order will begin processing with delivery to her home. Subsequent refills must be called into BI by the patient as outlined above.  Emma [name] P. [name], [place]  [phone]

## 2022-05-13 ENCOUNTER — Telehealth: Payer: Self-pay | Admitting: Internal Medicine

## 2022-05-13 DIAGNOSIS — I10 Essential (primary) hypertension: Secondary | ICD-10-CM

## 2022-05-13 DIAGNOSIS — E118 Type 2 diabetes mellitus with unspecified complications: Secondary | ICD-10-CM

## 2022-05-13 NOTE — Telephone Encounter (Signed)
error 

## 2022-05-13 NOTE — Telephone Encounter (Signed)
-----   Message from Maren Reamer, [facility] sent at 05/13/2022  2:58 PM EST ----- Regarding: 2300 Referral Needing a 2300-Pharmacy referral placed for management of T2DM/HTN, at request of Camelia Eng. Tysinger, PA-C.  Thank you,  Maren Reamer Clinical Pharmacist [phone]

## 2022-05-13 NOTE — Telephone Encounter (Signed)
-----   Message from Maren Reamer, [facility] sent at 05/13/2022  2:58 PM EST ----- Regarding: 2300 Referral Needing a 2300-Pharmacy referral placed for management of T2DM/HTN, at request of Camelia Eng. Tysinger, PA-C.  Thank you,  Maren Reamer Clinical Pharmacist [phone]

## 2022-05-13 NOTE — Telephone Encounter (Signed)
done 

## 2022-06-08 ENCOUNTER — Telehealth: Payer: Self-pay | Admitting: Gastroenterology

## 2022-06-08 NOTE — Telephone Encounter (Signed)
Patient called said she has bracelets that she can not removed wondering if that would be okay has a procedure for this afternoon.

## 2022-06-09 ENCOUNTER — Ambulatory Visit (AMBULATORY_SURGERY_CENTER): Payer: Medicare Other | Admitting: Gastroenterology

## 2022-06-09 ENCOUNTER — Encounter: Payer: Self-pay | Admitting: Gastroenterology

## 2022-06-09 VITALS — BP 122/56 | HR 56 | Temp 98.2°F | Resp 19 | Ht 67.0 in | Wt 231.0 lb

## 2022-06-09 DIAGNOSIS — D122 Benign neoplasm of ascending colon: Secondary | ICD-10-CM

## 2022-06-09 DIAGNOSIS — Z86010 Personal history of colonic polyps: Secondary | ICD-10-CM | POA: Diagnosis not present

## 2022-06-09 DIAGNOSIS — Z09 Encounter for follow-up examination after completed treatment for conditions other than malignant neoplasm: Secondary | ICD-10-CM | POA: Diagnosis not present

## 2022-06-09 DIAGNOSIS — D127 Benign neoplasm of rectosigmoid junction: Secondary | ICD-10-CM | POA: Diagnosis not present

## 2022-06-09 DIAGNOSIS — D123 Benign neoplasm of transverse colon: Secondary | ICD-10-CM

## 2022-06-09 DIAGNOSIS — K635 Polyp of colon: Secondary | ICD-10-CM

## 2022-06-09 MED ORDER — SODIUM CHLORIDE 0.9 % IV SOLN: 500.0000 mL | Freq: Once | INTRAVENOUS | Status: DC

## 2022-06-09 NOTE — Progress Notes (Signed)
Called to room to assist during endoscopic procedure.  Patient ID and intended procedure confirmed with present staff. Received instructions for my participation in the procedure from the performing physician.  

## 2022-06-09 NOTE — Patient Instructions (Signed)
Discharge instructions given. Handouts on polyps and Hemorrhoids. Resume previous medications. YOU HAD AN ENDOSCOPIC PROCEDURE TODAY AT THE [facility]:   Refer to the procedure report that was given to you for any specific questions about what was found during the examination.  If the procedure report does not answer your questions, please call your gastroenterologist to clarify.  If you requested that your care partner not be given the details of your procedure findings, then the procedure report has been included in a sealed envelope for you to review at your convenience later.  YOU SHOULD EXPECT: Some feelings of bloating in the abdomen. Passage of more gas than usual.  Walking can help get rid of the air that was put into your GI tract during the procedure and reduce the bloating. If you had a lower endoscopy (such as a colonoscopy or flexible sigmoidoscopy) you may notice spotting of blood in your stool or on the toilet paper. If you underwent a bowel prep for your procedure, you may not have a normal bowel movement for a few days.  Please Note:  You might notice some irritation and congestion in your nose or some drainage.  This is from the oxygen used during your procedure.  There is no need for concern and it should clear up in a day or so.  SYMPTOMS TO REPORT IMMEDIATELY:  Following lower endoscopy (colonoscopy or flexible sigmoidoscopy):  Excessive amounts of blood in the stool  Significant tenderness or worsening of abdominal pains  Swelling of the abdomen that is new, acute  Fever of 100F or higher  For urgent or emergent issues, a gastroenterologist can be reached at any hour by calling [phone]. Do not use MyChart messaging for urgent concerns.    DIET:  We do recommend a small meal at first, but then you may proceed to your regular diet.  Drink plenty of fluids but you should avoid alcoholic beverages for 24 hours.  ACTIVITY:  You should plan to take it easy  for the rest of today and you should NOT DRIVE or use heavy machinery until tomorrow (because of the sedation medicines used during the test).    FOLLOW UP: Our staff will call the number listed on your records the next business day following your procedure.  We will call around 7:15- 8:00 am to check on you and address any questions or concerns that you may have regarding the information given to you following your procedure. If we do not reach you, we will leave a message.     If any biopsies were taken you will be contacted by phone or by letter within the next 1-3 weeks.  Please call us at [phone] if you have not heard about the biopsies in 3 weeks.    SIGNATURES/CONFIDENTIALITY: You and/or your care partner have signed paperwork which will be entered into your electronic medical record.  These signatures attest to the fact that that the information above on your After Visit Summary has been reviewed and is understood.  Full responsibility of the confidentiality of this discharge information lies with you and/or your care-partner. 

## 2022-06-09 NOTE — Op Note (Signed)
[place] Patient Name: Emma [name] Procedure Date: 06/09/2022 3:08 PM MRN: [id number] Endoscopist: Remo Lipps P. Havery Moros , MD, [id number] Age: 67 Referring MD:  Date of Birth: Aug 20, 1955 Gender: Female Account #: [id number] Procedure:                Colonoscopy Indications:              High risk colon cancer surveillance: Personal                            history of colonic polyps - 2 sessile serrated                            polyps removed 05/2017 Medicines:                Monitored Anesthesia Care Procedure:                Pre-Anesthesia Assessment:                           - Prior to the procedure, a History and Physical                            was performed, and patient medications and                            allergies were reviewed. The patient's tolerance of                            previous anesthesia was also reviewed. The risks                            and benefits of the procedure and the sedation                            options and risks were discussed with the patient.                            All questions were answered, and informed consent                            was obtained. Prior Anticoagulants: The patient has                            taken no anticoagulant or antiplatelet agents. ASA                            Grade Assessment: III - A patient with severe                            systemic disease. After reviewing the risks and                            benefits, the patient was deemed in satisfactory  condition to undergo the procedure.                           After obtaining informed consent, the colonoscope                            was passed under direct vision. Throughout the                            procedure, the patient's blood pressure, pulse, and                            oxygen saturations were monitored continuously. The                            Olympus [id number]-H190DL [id number]  Colonoscope was                            introduced through the anus and advanced to the the                            cecum, identified by appendiceal orifice and                            ileocecal valve. The colonoscopy was performed                            without difficulty. The patient tolerated the                            procedure well. The quality of the bowel                            preparation was adequate. The ileocecal valve,                            appendiceal orifice, and rectum were photographed. Scope In: 3:29:03 PM Scope Out: 3:52:11 PM Scope Withdrawal Time: 0 hours 11 minutes 45 seconds  Total Procedure Duration: 0 hours 23 minutes 8 seconds  Findings:                 The perianal and digital rectal examinations were                            normal.                           A large amount of liquid stool was found in the                            ascending colon and in the cecum, making                            visualization difficult. Lavage of the area was  performed using copious amounts of sterile water,                            resulting in clearance with adequate visualization.                           Two sessile polyps were found in the ascending                            colon. The polyps were 3 to 7 mm in size. These                            polyps were removed with a cold snare. Resection                            and retrieval were complete.                           A 4 mm polyp was found in the hepatic flexure. The                            polyp was sessile. The polyp was removed with a                            cold snare. Resection and retrieval were complete.                           A 3 to 4 mm polyp was found in the transverse                            colon. The polyp was sessile. The polyp was removed                            with a cold snare. Resection and retrieval were                             complete.                           A 4 mm polyp was found in the splenic flexure. The                            polyp was sessile. The polyp was removed with a                            cold snare. Resection and retrieval were complete.                           Internal hemorrhoids were found during retroflexion.                           The exam was otherwise without abnormality. Complications:  No immediate complications. Estimated blood loss:                            Minimal. Estimated Blood Loss:     Estimated blood loss was minimal. Impression:               - Two 3 to 7 mm polyps in the ascending colon,                            removed with a cold snare. Resected and retrieved.                           - One 4 mm polyp at the hepatic flexure, removed                            with a cold snare. Resected and retrieved.                           - One 3 to 4 mm polyp in the transverse colon,                            removed with a cold snare. Resected and retrieved.                           - One 4 mm polyp at the splenic flexure, removed                            with a cold snare. Resected and retrieved.                           - Internal hemorrhoids.                           - The examination was otherwise normal. Recommendation:           - Patient has a contact number available for                            emergencies. The signs and symptoms of potential                            delayed complications were discussed with the                            patient. Return to normal activities tomorrow.                            Written discharge instructions were provided to the                            patient.                           - Resume previous diet.                           -  Continue present medications.                           - Await pathology results. Remo Lipps P. Havery Moros, MD 06/09/2022 3:59:51 PM This report has been  signed electronically.

## 2022-06-09 NOTE — Progress Notes (Signed)
Vss nad trans to pacu 

## 2022-06-09 NOTE — Progress Notes (Signed)
VS by DT  Pt's states no medical or surgical changes since previsit or office visit.  

## 2022-06-09 NOTE — Telephone Encounter (Signed)
Called pt back.  No answer.  Left voicemail letting pt know that it's ok to leave bracelets on for her procedure.

## 2022-06-09 NOTE — Progress Notes (Signed)
[facility] History and Physical   Primary Care Physician:  Carlena Hurl, PA-C   Reason for Procedure:   History of colon polyps  Plan:    colonoscopy     HPI: Emma REINSEL is a 67 y.o. female  here for colonoscopy surveillance - 2 SSPs removed 05/2017. She has had some alternating constipation / loose stools, fiber recommended at last visit. She also has a history of anal fissure recently that has been treated. Referred to pelvic floor PT for incontinence.  No family history of colon cancer known. Otherwise feels well without any cardiopulmonary symptoms.   I have discussed risks / benefits of anesthesia and endoscopic procedure with Carmina Miller and they wish to proceed with the exams as outlined today.    Past Medical History:  Diagnosis Date   Chronic constipation    CKD (chronic kidney disease), stage III (HCC)    Cluster headache syndrome 04/27/2013   Colon polyp 06/11/2011   Most recent 04/23/08, no polyp--mild diverticulosis.  Repeat 10 yrs.   Depression with anxiety 02/22/2008   Qualifier: Diagnosis of  By: Nolon Rod CMA (AAMA), Robin     Diabetes mellitus without complication ([place])    Diverticulosis    GERD (gastroesophageal reflux disease) 06/11/2011   Hypertension    Recently diagnosed   Migraine syndrome    Osteopenia 11/2016   bone density. plan repeat in 11/2018   Overweight(278.02) 06/11/2011   PUD (peptic ulcer disease) 04/23/2008   Multiple antrum ulcers; h pylori neg   Rheumatoid arthritis(714.0)     Past Surgical History:  Procedure Laterality Date   ABDOMINAL HYSTERECTOMY     APPENDECTOMY     BACK SURGERY     3 times   CARDIOVASCULAR STRESS TEST  06/22/2011   Normal; EF 65%   CHOLECYSTECTOMY     COLONOSCOPY  04/23/2008   mild diverticulosis, internal hemorrhoids; Dr. Erskine Emery   ENDOVENOUS ABLATION SAPHENOUS VEIN W/ LASER Left 06/07/2019   endovenous laser ablation left greater saphenous vein and stab phlebectomy < 10  incisions left leg by Gae Gallop MD   ENDOVENOUS ABLATION SAPHENOUS VEIN W/ LASER Right 12/27/2019   eyelid tightening Bilateral    09/2021   LIVER BIOPSY  03/2007   Chronic active hepatitis w/fibrosis--related to hx of methotrexate treatment.   oral implants  2022   SKIN BIOPSY  08/16/2017   superficial basal cell carcinoma left inferior medial anterior tibia     Prior to Admission medications   Medication Sig Start Date End Date Taking? Authorizing Provider  acetaminophen (TYLENOL) 500 MG tablet Take 1,500 mg by mouth once as needed for mild pain or moderate pain.    Yes [provider]  ALPRAZolam Duanne Moron) 0.5 MG tablet TAKE 1 TABLET BY MOUTH EVERYDAY AT BEDTIME 09/18/21  Yes Tysinger, Camelia Eng, PA-C  AMBULATORY NON FORMULARY MEDICATION Diltiazem gel with 2% lidocaine  Apply a pea sized amount into your rectum three times daily until healed Dispense 30 GM zero refill 04/27/22  Yes Mckenize Mezera, Carlota Raspberry, MD  busPIRone (BUSPAR) 7.5 MG tablet Take 1 tablet (7.5 mg total) by mouth 2 (two) times daily. 10/14/21  Yes Nirav Sweda, Carlota Raspberry, MD  calcitRIOL (ROCALTROL) 0.25 MCG capsule Take 0.25 mcg by mouth 3 (three) times a week. 06/24/21  Yes [provider]  calcium carbonate ([employer] CALCIUM) 1500 (600 Ca) MG TABS tablet TAKE 1 TABLET (600 MG TOTAL) BY MOUTH 2 (TWO) TIMES DAILY WITH A MEAL. 06/06/18  Yes Tysinger, Shanon Brow  S, PA-C  carisoprodol (SOMA) 350 MG tablet TAKE 1 TABLET BY MOUTH TWICE A DAY AND AT BEDTIME AS NEEDED FOR MUSCLE SPASMS 05/23/17  Yes [provider]  Cholecalciferol 50 MCG (2000 UT) CAPS 1 capsule Orally once weekly   Yes [provider]  dicyclomine (BENTYL) 10 MG capsule Take 1-2 capsules (10-20 mg total) by mouth every 8 (eight) hours as needed for spasms. 02/16/21  Yes Brieanne Mignone, Carlota Raspberry, MD  diphenhydrAMINE (BENADRYL) 25 mg capsule Take by mouth.   Yes [provider]  furosemide (LASIX) 20 MG tablet TAKE 1 TABLET BY MOUTH EVERY DAY  04/02/22  Yes Chandrasekhar, Mahesh A, MD  gabapentin (NEURONTIN) 300 MG capsule TAKE 1 CAPSULE BY MOUTH THREE TIMES A DAY AS NEEDED 10/07/21  Yes Tysinger, Camelia Eng, PA-C  [name] 10 MG TABS tablet Take 10 mg by mouth every morning. 08/25/21  Yes [provider]  [name] 10 MG TABS Take 1 tablet by mouth daily. 10/07/21  Yes [provider]  lisinopril (ZESTRIL) 30 MG tablet Take 30 mg by mouth daily. 09/28/21  Yes [provider]  metFORMIN (GLUCOPHAGE) 500 MG tablet TAKE 1 TABLET BY MOUTH EVERY DAY WITH BREAKFAST 04/29/22  Yes Tysinger, Camelia Eng, PA-C  methylcellulose (CITRUCEL) oral powder Take 1 packet by mouth daily. 04/27/22  Yes Quanell Loughney, Carlota Raspberry, MD  mirtazapine (REMERON) 15 MG tablet TAKE 1/2 OR 1 TABLET BY MOUTH AT BEDTIME 12/31/21  Yes Tysinger, Camelia Eng, PA-C  Multiple Vitamins-Iron (MULTIVITAMINS WITH IRON) TABS tablet Take 1 tablet by mouth daily. 09/28/18  Yes Tysinger, Camelia Eng, PA-C  omeprazole (PRILOSEC) 40 MG capsule TAKE 1 CAPSULE BY MOUTH EVERY DAY 04/29/22  Yes Tysinger, Camelia Eng, PA-C  verapamil (CALAN-SR) 240 MG CR tablet TAKE 1 TABLET BY MOUTH EVERYDAY AT BEDTIME 04/29/22  Yes Tysinger, Camelia Eng, PA-C  albuterol (VENTOLIN HFA) 108 (90 Base) MCG/ACT inhaler TAKE 2 PUFFS BY MOUTH EVERY 6 HOURS AS NEEDED FOR WHEEZE OR SHORTNESS OF BREATH 04/29/22   Tysinger, Camelia Eng, PA-C  BIOTIN PO Take 1 capsule by mouth daily.     [provider]  HYDROcodone-acetaminophen (NORCO) 7.5-325 MG tablet Take by mouth. 05/03/11   [provider]  hydroxychloroquine (PLAQUENIL) 200 MG tablet one tablet Orally twice daily for 30 days 08/12/20   [provider]  Lubiprostone (AMITIZA PO) Take by mouth. Take 1-2 tablets as needed for conspitation Patient not taking: Reported on 06/09/2022    [provider]  lubiprostone (AMITIZA) 24 MCG capsule Take 1 capsule (24 mcg total) by mouth 2 (two) times daily with a meal. 10/07/21   Tysinger, Camelia Eng, PA-C  Rectal  Protectant-Emollient (CALMOL-4) 76-10 % SUPP Use as directed once to twice daily Patient not taking: Reported on 06/09/2022 04/27/22   Yetta Flock, MD  rosuvastatin (CRESTOR) 20 MG tablet TAKE 1 TABLET BY MOUTH EVERY DAY 03/23/22   Tysinger, Camelia Eng, PA-C    Current Outpatient Medications  Medication Sig Dispense Refill   acetaminophen (TYLENOL) 500 MG tablet Take 1,500 mg by mouth once as needed for mild pain or moderate pain.      ALPRAZolam (XANAX) 0.5 MG tablet TAKE 1 TABLET BY MOUTH EVERYDAY AT BEDTIME 30 tablet 0   AMBULATORY NON FORMULARY MEDICATION Diltiazem gel with 2% lidocaine  Apply a pea sized amount into your rectum three times daily until healed Dispense 30 GM zero refill 30 g 0   busPIRone (BUSPAR) 7.5 MG tablet Take 1 tablet (7.5 mg total)  by mouth 2 (two) times daily. 180 tablet 1   calcitRIOL (ROCALTROL) 0.25 MCG capsule Take 0.25 mcg by mouth 3 (three) times a week.     calcium carbonate ([employer] CALCIUM) 1500 (600 Ca) MG TABS tablet TAKE 1 TABLET (600 MG TOTAL) BY MOUTH 2 (TWO) TIMES DAILY WITH A MEAL. 150 tablet 1   carisoprodol (SOMA) 350 MG tablet TAKE 1 TABLET BY MOUTH TWICE A DAY AND AT BEDTIME AS NEEDED FOR MUSCLE SPASMS  0   Cholecalciferol 50 MCG (2000 UT) CAPS 1 capsule Orally once weekly     dicyclomine (BENTYL) 10 MG capsule Take 1-2 capsules (10-20 mg total) by mouth every 8 (eight) hours as needed for spasms. 30 capsule 1   diphenhydrAMINE (BENADRYL) 25 mg capsule Take by mouth.     furosemide (LASIX) 20 MG tablet TAKE 1 TABLET BY MOUTH EVERY DAY 90 tablet 1   gabapentin (NEURONTIN) 300 MG capsule TAKE 1 CAPSULE BY MOUTH THREE TIMES A DAY AS NEEDED 270 capsule 2   [name] 10 MG TABS tablet Take 10 mg by mouth every morning.     [name] 10 MG TABS Take 1 tablet by mouth daily.     lisinopril (ZESTRIL) 30 MG tablet Take 30 mg by mouth daily.     metFORMIN (GLUCOPHAGE) 500 MG tablet TAKE 1 TABLET BY MOUTH EVERY DAY WITH BREAKFAST 90 tablet 0    methylcellulose (CITRUCEL) oral powder Take 1 packet by mouth daily.     mirtazapine (REMERON) 15 MG tablet TAKE 1/2 OR 1 TABLET BY MOUTH AT BEDTIME 90 tablet 1   Multiple Vitamins-Iron (MULTIVITAMINS WITH IRON) TABS tablet Take 1 tablet by mouth daily. 90 tablet 0   omeprazole (PRILOSEC) 40 MG capsule TAKE 1 CAPSULE BY MOUTH EVERY DAY 90 capsule 0   verapamil (CALAN-SR) 240 MG CR tablet TAKE 1 TABLET BY MOUTH EVERYDAY AT BEDTIME 90 tablet 0   albuterol (VENTOLIN HFA) 108 (90 Base) MCG/ACT inhaler TAKE 2 PUFFS BY MOUTH EVERY 6 HOURS AS NEEDED FOR WHEEZE OR SHORTNESS OF BREATH 18 each 0   BIOTIN PO Take 1 capsule by mouth daily.      HYDROcodone-acetaminophen (NORCO) 7.5-325 MG tablet Take by mouth.     hydroxychloroquine (PLAQUENIL) 200 MG tablet one tablet Orally twice daily for 30 days     Lubiprostone (AMITIZA PO) Take by mouth. Take 1-2 tablets as needed for conspitation (Patient not taking: Reported on 06/09/2022)     lubiprostone (AMITIZA) 24 MCG capsule Take 1 capsule (24 mcg total) by mouth 2 (two) times daily with a meal. 180 capsule 0   Rectal Protectant-Emollient (CALMOL-4) 76-10 % SUPP Use as directed once to twice daily (Patient not taking: Reported on 06/09/2022) 6 suppository 0   rosuvastatin (CRESTOR) 20 MG tablet TAKE 1 TABLET BY MOUTH EVERY DAY 90 tablet 1   Current Facility-Administered Medications  Medication Dose Route Frequency Provider Last Rate Last Admin   0.9 %  sodium chloride infusion  500 mL Intravenous Once Sanford Lindblad, Carlota Raspberry, MD        Allergies as of 06/09/2022 - Review Complete 06/09/2022  Allergen Reaction Noted   Bee venom Anaphylaxis 06/11/2011   Farxiga [dapagliflozin]  10/06/2021    Family History  Problem Relation Age of Onset   Diabetes Mother    Stroke Mother    Arthritis Father    Hypertension Father    Atrial fibrillation Father    Alcohol abuse Sister    Cancer Sister  mesothelioma   Heart disease Sister        cardiomegaly    Hypertension Sister    Pulmonary embolism Sister    Hypertension Brother    Polycystic ovary syndrome Daughter    Heart disease Maternal Grandmother    Heart disease Maternal Grandfather    Colon cancer Paternal Uncle    Esophageal cancer Neg Hx    Stomach cancer Neg Hx     Social History   Socioeconomic History   Marital status: Married    Spouse name: Not on file   Number of children: 1   Years of education: Not on file   Highest education level: Not on file  Occupational History   Occupation: retired on disability  Tobacco Use   Smoking status: Never   Smokeless tobacco: Never  Vaping Use   Vaping Use: Never used  Substance and Sexual Activity   Alcohol use: Not Currently   Drug use: No   Sexual activity: Not Currently    Partners: Male  Other Topics Concern   Not on file  Social History Narrative   Married, caregiver, limited exercise.   01/2018.   Social Determinants of Health   Financial Resource Strain: Low Risk  (12/01/2021)   Overall Financial Resource Strain (CARDIA)    Difficulty of Paying Living Expenses: Not hard at all  Food Insecurity: No Food Insecurity (12/01/2021)   Hunger Vital Sign    Worried About Running Out of Food in the Last Year: Never true    Ran Out of Food in the Last Year: Never true  Transportation Needs: No Transportation Needs (12/01/2021)   PRAPARE - Hydrologist (Medical): No    Lack of Transportation (Non-Medical): No  Physical Activity: Inactive (12/01/2021)   Exercise Vital Sign    Days of Exercise per Week: 0 days    Minutes of Exercise per Session: 0 min  Stress: No Stress Concern Present (12/01/2021)   [place]    Feeling of Stress : Not at all  Social Connections: Not on file  Intimate Partner Violence: Not on file    Review of Systems: All other review of systems negative except as mentioned in the HPI.  Physical  Exam: Vital signs BP (!) 139/55   Pulse 64   Temp 98.2 F (36.8 C)   Ht 5\' 7"  (1.702 m)   Wt 231 lb (104.8 kg)   SpO2 97%   BMI 36.18 kg/m   General:   Alert,  Well-developed, pleasant and cooperative in NAD Lungs:  Clear throughout to auscultation.   Heart:  Regular rate and rhythm Abdomen:  Soft, nontender and nondistended.   Neuro/Psych:  Alert and cooperative. Normal mood and affect. A and O x 3  Jolly Mango, MD [facility]

## 2022-06-10 ENCOUNTER — Telehealth: Payer: Self-pay | Admitting: *Deleted

## 2022-06-10 NOTE — Telephone Encounter (Signed)
Attempted f/u phone call. No answer. Left message. °

## 2022-06-14 ENCOUNTER — Other Ambulatory Visit: Payer: Self-pay

## 2022-06-14 ENCOUNTER — Ambulatory Visit: Payer: Medicare Other | Attending: Gastroenterology | Admitting: Physical Therapy

## 2022-06-14 DIAGNOSIS — M5459 Other low back pain: Secondary | ICD-10-CM | POA: Insufficient documentation

## 2022-06-14 DIAGNOSIS — K5909 Other constipation: Secondary | ICD-10-CM | POA: Diagnosis not present

## 2022-06-14 DIAGNOSIS — R159 Full incontinence of feces: Secondary | ICD-10-CM | POA: Diagnosis not present

## 2022-06-14 DIAGNOSIS — M62838 Other muscle spasm: Secondary | ICD-10-CM | POA: Diagnosis present

## 2022-06-14 DIAGNOSIS — R293 Abnormal posture: Secondary | ICD-10-CM | POA: Insufficient documentation

## 2022-06-14 NOTE — Therapy (Signed)
OUTPATIENT PHYSICAL THERAPY FEMALE PELVIC EVALUATION   Patient Name: Emma [name] MRN: [id number] DOB:05-09-1955, 67 y.o., female Today's Date: 06/14/2022  END OF SESSION:  PT End of Session - 06/14/22 1530     Visit Number 1    Date for PT Re-Evaluation 09/06/22    Authorization Type Medicare A/B    Progress Note Due on Visit 10    PT Start Time 1400    PT Stop Time 1444    PT Time Calculation (min) 44 min    Activity Tolerance Patient tolerated treatment well    Behavior During Therapy [facility] for tasks assessed/performed             Past Medical History:  Diagnosis Date   Chronic constipation    CKD (chronic kidney disease), stage III (HCC)    Cluster headache syndrome 04/27/2013   Colon polyp 06/11/2011   Most recent 04/23/08, no polyp--mild diverticulosis.  Repeat 10 yrs.   Depression with anxiety 02/22/2008   Qualifier: Diagnosis of  By: Nolon Rod CMA (AAMA), Robin     Diabetes mellitus without complication ([place])    Diverticulosis    GERD (gastroesophageal reflux disease) 06/11/2011   Hypertension    Recently diagnosed   Migraine syndrome    Osteopenia 11/2016   bone density. plan repeat in 11/2018   Overweight(278.02) 06/11/2011   PUD (peptic ulcer disease) 04/23/2008   Multiple antrum ulcers; h pylori neg   Rheumatoid arthritis(714.0)    Past Surgical History:  Procedure Laterality Date   ABDOMINAL HYSTERECTOMY     APPENDECTOMY     BACK SURGERY     3 times   CARDIOVASCULAR STRESS TEST  06/22/2011   Normal; EF 65%   CHOLECYSTECTOMY     COLONOSCOPY  04/23/2008   mild diverticulosis, internal hemorrhoids; Dr. Erskine Emery   ENDOVENOUS ABLATION SAPHENOUS VEIN W/ LASER Left 06/07/2019   endovenous laser ablation left greater saphenous vein and stab phlebectomy < 10 incisions left leg by Gae Gallop MD   ENDOVENOUS ABLATION SAPHENOUS VEIN W/ LASER Right 12/27/2019   eyelid tightening Bilateral    09/2021   LIVER BIOPSY  03/2007   Chronic active  hepatitis w/fibrosis--related to hx of methotrexate treatment.   oral implants  2022   SKIN BIOPSY  08/16/2017   superficial basal cell carcinoma left inferior medial anterior tibia    Patient Active Problem List   Diagnosis Date Noted   Pain in both lower extremities 08/03/2021   Leg swelling 08/03/2021   Bilateral lower extremity edema 03/27/2021   Need for pneumococcal vaccination 02/19/2021   Screening for heart disease 02/19/2021   LVH (left ventricular hypertrophy) 02/19/2021   Chronic insomnia 02/19/2021   Emotional disorder 01/29/2021   Meibomianitis of left eye 06/23/2020   Allergic conjunctivitis 06/23/2020   Allergic rhinitis due to pollen 06/23/2020   Rash 06/23/2020   Stage 3b chronic kidney disease ([place]) 01/28/2020   Varicose veins of both lower extremities 07/10/2019   Anemia 09/27/2018   Decreased pedal pulses 09/27/2018   Chronic migraine 08/22/2018   Medicare annual wellness visit, subsequent 01/23/2018   Hyperlipidemia 07/20/2017   Osteopenia 12/16/2016   Vaccine counseling 12/16/2016   Vitamin D deficiency 12/16/2016   Microalbuminuria 05/19/2016   Renal insufficiency 05/19/2016   Post-menopausal 05/19/2016   Estrogen deficiency 05/19/2016   Skin lesion 05/19/2016   Polyarthralgia 12/22/2015   Need for prophylactic vaccination against Streptococcus pneumoniae (pneumococcus) 12/22/2015   Need for prophylactic vaccination and inoculation against influenza 12/22/2015  Essential hypertension 03/27/2015   Rheumatoid arthritis ([place]) 03/27/2015   High risk medication use 03/27/2015   Insomnia 03/27/2015   Chronic constipation 03/27/2015   Edema 03/27/2015   History of migraine headaches 03/27/2015   Obesity 03/27/2015   Chronic cluster headache, not intractable 03/27/2015   Chronic venous insufficiency 03/27/2015   Diabetes mellitus with complication ([place]) [id number]   Gastroesophageal reflux disease without esophagitis 03/27/2015   IBS (irritable bowel  syndrome) 03/27/2015   Chest pain of uncertain etiology [id number]   Colon polyp 06/11/2011    PCP: Carlena Hurl, PA-C   REFERRING PROVIDER: Yetta Flock, MD   REFERRING DIAG: fecal incontinence, chronic constipation  THERAPY DIAG:  Other muscle spasm  Abnormal posture  Other low back pain  Rationale for Evaluation and Treatment: Rehabilitation  ONSET DATE: two years is when it started getting worse  SUBJECTIVE:                                                                                                                                                                                           SUBJECTIVE STATEMENT: Pt is having constipation and has extreme urgency and that has worsened in the last two years. Pt has to plan going anywhere and that dictates social outings. Pt is taking a lot of fiber now and when that kicks in pt in the bathroom all day. Pt also has chronic back pain with heat and TENS relieving that Fluid intake: Yes: 8 glasses water plus teas etc    PAIN:  Are you having pain? Yes (sometimes) NPRS scale: 3-10/10 Pain location:  lower abdomen  Pain type: aching and severe cramping Pain description: intermittent   Aggravating factors: sometimes after bowel movement  Relieving factors: unknown  PRECAUTIONS: None  WEIGHT BEARING RESTRICTIONS: No  FALLS:  Has patient fallen in last 6 months? No  LIVING ENVIRONMENT: Lives with: lives alone Lives in: House/apartment   OCCUPATION: no  PLOF: Independent  PATIENT GOALS: better control of bowel movement, less urgency, improve constipation  PERTINENT HISTORY:  CKD, chronic constipation, DM, osteopenia, hysterectomy partial,back surgery x3, appendectomy, cholecystectomy, GERD, HTN Sexual abuse: No  BOWEL MOVEMENT: Pain with bowel movement: Yes Type of bowel movement:Type (Bristol Stool Scale) small hard or liquit, Frequency every 2-3 days is the best it has been; , and Strain Yes  trying not to Fully empty rectum: No Leakage: Yes: cannot make it with extreme urge sometimes Pads: No Fiber supplement: Yes: citrucel  URINATION: Pain with urination: No Fully empty bladder: No Stream: Strong Urgency: No Frequency: nocturia 1/night Leakage:  no Pads: No  INTERCOURSE: Not assessed  PREGNANCY: Vaginal  deliveries 1   PROLAPSE: None   OBJECTIVE:   DIAGNOSTIC FINDINGS:    PATIENT SURVEYS:    PFIQ-7   COGNITION: Overall cognitive status: Within functional limits for tasks assessed     SENSATION: Light touch:  Proprioception:   MUSCLE LENGTH: Hamstrings: Right 70 deg; Left 70 deg Thomas test:   LUMBAR SPECIAL TESTS:  Straight leg raise test: Negative  FUNCTIONAL TESTS:  Single leg with trendelenburg bil and touching legs together  GAIT:  Comments: shortened stride length and mild trendelenburg bil  POSTURE: rounded shoulders, increased lumbar lordosis, increased thoracic kyphosis, and anterior pelvic tilt  PELVIC ALIGNMENT:  LUMBARAROM/PROM:  A/PROM A/PROM  eval  Flexion 50%  Extension   Right lateral flexion   Left lateral flexion   Right rotation   Left rotation    (Blank rows = not tested)  LOWER EXTREMITY ROM:  Passive ROM Right eval Left eval  Hip flexion 90% 90%  Hip extension    Hip abduction    Hip adduction    Hip internal rotation 60% 60%  Hip external rotation 75% 75%  Knee flexion    Knee extension    Ankle dorsiflexion    Ankle plantarflexion    Ankle inversion    Ankle eversion     (Blank rows = not tested)  LOWER EXTREMITY MMT:  MMT Right eval Left eval  Hip flexion    Hip extension    Hip abduction 4/5 4/5  Hip adduction 3/5 3/5  Hip internal rotation    Hip external rotation    Knee flexion    Knee extension    Ankle dorsiflexion    Ankle plantarflexion    Ankle inversion    Ankle eversion     PALPATION:   General  tendting at abdomen about 2 fingers, upper right quadrant tight  and tender, lumbar paraspinals tight Rt/Lt                External Perineal Exam NA                             Internal Pelvic Floor NA  Patient confirms identification and approves PT to assess internal pelvic floor and treatment deferred due to pain  PELVIC MMT:   MMT eval  Vaginal   Internal Anal Sphincter   External Anal Sphincter   Puborectalis   Diastasis Recti Above umbilicus   (Blank rows = not tested)        TONE: NA  PROLAPSE: NA  TODAY'S TREATMENT:                                                                                                                              DATE: 06/14/22  EVAL and toilet techniques, peri-bottle, hip rotation stretches, knee hugs, diaphragm breathing   PATIENT EDUCATION:  Education details: handouts as seen above Person educated: Patient Education method: Explanation, Demonstration, Corporate treasurer  cues, Verbal cues, and Handouts Education comprehension: verbalized understanding and returned demonstration  HOME EXERCISE PROGRAM: Stretches and breathing  ASSESSMENT:  CLINICAL IMPRESSION: Patient is a 67 y.o. female who was seen today for physical therapy evaluation and treatment for constipation and fecal incontinence/urgency. No internal assessment done today due to pt being in a lot of pain from recent assessments.  Pt is able to describe symptoms enough to guide PT to believe that muscles are tight and lacking some sensation due to chronic high levels of pain. Pt has recently been diagnosed with hemorrhoids and anal fissure.  Pt has limited lumbar flexion and sidebend Lt>Rt.  Pt has tight hamstrings bil and limited hip rotation ROM bil. Pt has hip weakness noted in standing and with MMT.  Pt has abdominal weakness and decreased coordination of breathingand abdominal muscle activity. Pt has tight and tender Rt flank along ascending colon.  Pt will benefit from skilled PT to address all impairments and improve function with better bowel  control so that she can fully participate in activities.  OBJECTIVE IMPAIRMENTS: decreased coordination, decreased endurance, decreased ROM, decreased strength, increased muscle spasms, impaired flexibility, impaired tone, postural dysfunction, and pain.   ACTIVITY LIMITATIONS: sleeping, continence, and toileting  PARTICIPATION LIMITATIONS: interpersonal relationship and community activity  PERSONAL FACTORS: 3+ comorbidities: anal fissure, hemorrhoid, CKD, chronic constipation, DM, osteopenia, hysterectomy partial,back surgery x3, appendectomy, cholecystectomy, GERD, HTN  are also affecting patient's functional outcome.   REHAB POTENTIAL: Excellent  CLINICAL DECISION MAKING: Evolving/moderate complexity  EVALUATION COMPLEXITY: Moderate   GOALS: Goals reviewed with patient? Yes  SHORT TERM GOALS: Target date: 07/12/22  Ind with toileting and skin care for bowel movements. Baseline: Goal status: INITIAL  2.  Pt will be able to use a stool for improved bowel movement position. Baseline:  Goal status: INITIAL  LONG TERM GOALS: Target date: 09/06/22  Pt will be independent with advanced HEP to maintain improvements made throughout therapy  Baseline:  Goal status: INITIAL  2.  Pt will report her BMs are more complete due to improved bowel habits and evacuation techniques.  Baseline:  Goal status: INITIAL  3.  Pt will be able to functional actions such as wait 5 minutes on line to use the bathroom without leakage  Baseline:  Goal status: INITIAL  4.  Pt will report 50% less low back pain due to improved coordination of core and pelvic floor muscles during functional activities. Baseline:  Goal status: INITIAL    PLAN:  PT FREQUENCY: 1x/week  PT DURATION: 12 weeks  PLANNED INTERVENTIONS: Therapeutic exercises, Therapeutic activity, Neuromuscular re-education, Balance training, Gait training, Patient/Family education, Self Care, Joint mobilization, Aquatic Therapy, Dry  Needling, Electrical stimulation, Cryotherapy, Moist heat, Taping, Biofeedback, Manual therapy, and Re-evaluation  PLAN FOR NEXT SESSION: dry needling lumbar and gluteals; continue stretches and breathing and issue medbridge exercises; breathing and core activation, urge techniques   Camillo Flaming Cumi Sanagustin, PT 06/14/2022, 3:41 PM

## 2022-06-16 ENCOUNTER — Encounter: Payer: Self-pay | Admitting: Gastroenterology

## 2022-06-22 ENCOUNTER — Other Ambulatory Visit: Payer: Self-pay | Admitting: Medical

## 2022-06-30 ENCOUNTER — Other Ambulatory Visit: Payer: Self-pay | Admitting: Medical

## 2022-06-30 ENCOUNTER — Ambulatory Visit
Admission: RE | Admit: 2022-06-30 | Discharge: 2022-06-30 | Disposition: A | Discharge status: Home / Self Care | Payer: Medicare Other | Source: Ambulatory Visit | Attending: Medical | Admitting: Medical

## 2022-06-30 ENCOUNTER — Ambulatory Visit (INDEPENDENT_AMBULATORY_CARE_PROVIDER_SITE_OTHER): Payer: Medicare Other | Admitting: Medical

## 2022-06-30 VITALS — BP 110/70 | HR 65 | Temp 97.3°F | Wt 232.2 lb

## 2022-06-30 DIAGNOSIS — R091 Pleurisy: Secondary | ICD-10-CM

## 2022-06-30 DIAGNOSIS — R0789 Other chest pain: Secondary | ICD-10-CM

## 2022-06-30 DIAGNOSIS — Z23 Encounter for immunization: Secondary | ICD-10-CM | POA: Diagnosis not present

## 2022-06-30 MED ORDER — METHYLPREDNISOLONE ACETATE 40 MG/ML IJ SUSP
40.0000 mg | Freq: Once | INTRAMUSCULAR | Status: AC
Start: 2022-06-30 — End: 2022-06-30
  Administered 2022-06-30: 40 mg via INTRAMUSCULAR

## 2022-06-30 MED ORDER — TRAMADOL HCL 50 MG PO TABS: 50.0000 mg | ORAL_TABLET | Freq: Two times a day (BID) | ORAL | 0 refills | Status: AC | PRN

## 2022-06-30 NOTE — Progress Notes (Signed)
Subjective:  Emma [name] is a 67 y.o. female who presents for Chief Complaint  Patient presents with   strained back    Back in December when she had Pneumonia, from coughing so much- her right side is causing severe pain in whatever she does. Its getting worse.      Here for pain in side.  She had a pneumonia like illness back in December and was coughing quite a bit for weeks and weeks weeks.  Got little better then got a secondary infection a while later.  Ever since December at the end of the year has had ongoing pains or discomfort in her right lower ribs/right side that has not resoled.  No nausea, no vomiting, no abdominal pain.   Has constipation but saw GI recently about this.  No rash, no skin changes in he area.   No numbness or tinging.  Sitting still doesn't hurt, but if moving, standing, turning, driving over a speed bump or other sets off the pain. Even lying down in the bed aggravates the pain.    No history of shingles but has had the shingles vaccine  She had been on gabapentin for neuropathy but this causes constipation similarly hydrocodone and pain medicine does the same.  She has some leftover hydrocodone and tried it but it did not seem to help this pain.  Not currently on the gabapentin.  She would like a COVID-vaccine today  No other aggravating or relieving factors.    No other c/o.  The following portions of the patient's history were reviewed and updated as appropriate: allergies, current medications, past family history, past medical history, past social history, past surgical history and problem list.  ROS Otherwise as in subjective above  Objective: BP 110/70   Pulse 65   Temp (!) 97.3 F (36.3 C)   Wt 232 lb 3.2 oz (105.3 kg)   BMI 36.37 kg/m   Wt Readings from Last 3 Encounters:  06/30/22 232 lb 3.2 oz (105.3 kg)  06/09/22 231 lb (104.8 kg)  04/27/22 231 lb (104.8 kg)   General appearance: alert, no distress, well developed, well  nourished Heart: RRR, normal S1, S2, no murmurs Lungs: CTA bilaterally, no wheezes, rhonchi, or rales Tender over right lateral chest wall at the lower border of the ribs and about area of the size of a fist, but no rash, no swelling, no vesicles.  It hurts to breathing out but inspiration and expiration seem otherwise normal.  No other tenderness of the chest wall or abdomen Abdomen: +bs, soft, non tender, non distended, no masses, no hepatomegaly, no splenomegaly Pulses: 2+ radial pulses, 2+ pedal pulses, normal cap refill Ext: no edema   Assessment: Encounter Diagnoses  Name Primary?   Chest wall pain Yes   Pleurisy    COVID-19 vaccine administered      Plan: We discussed her symptoms and possible causes.  I suspect pleurisy but need to rule out other causes.  Chest x-ray and rib x-ray to further evaluate.  Steroid injection given today.  I asked her to start back on gabapentin twice daily as I think this could help some of her symptoms.  She has hydrocodone if needed for breakthrough pain but that did not seem to help.  Counseled on the Covid virus vaccine.  Vaccine information sheet given.  Covid vaccine given after consent obtained.   Emma [name] was seen today for strained back.  Diagnoses and all orders for this visit:  Chest wall pain -  DG Chest 2 View; Future -     Cancel: DG Ribs Unilateral W/Chest Right; Future -     methylPREDNISolone acetate (DEPO-MEDROL) injection 40 mg  Pleurisy -     DG Chest 2 View; Future -     Cancel: DG Ribs Unilateral W/Chest Right; Future  COVID-19 vaccine administered -     Pfizer Fall 2023 Covid-19 Vaccine 98yrs and older  Other orders -     traMADol (ULTRAM) 50 MG tablet; Take 1 tablet (50 mg total) by mouth every 12 (twelve) hours as needed for up to 5 days.    Follow up: Pending x-rays

## 2022-06-30 NOTE — Patient Instructions (Signed)
Please go to [facility] for your rib and chest xray.   Their hours are 8am - 4:30 pm Monday - Friday.  Take your insurance card with you.  [facility] [phone]   [address] [place], [place] [address]

## 2022-07-01 ENCOUNTER — Telehealth: Payer: Self-pay

## 2022-07-01 NOTE — Telephone Encounter (Signed)
Pt is requesting a phone call regarding results from images taken at [facility].

## 2022-07-02 ENCOUNTER — Other Ambulatory Visit: Payer: Self-pay | Admitting: Medical

## 2022-07-02 MED ORDER — PREDNISONE 10 MG PO TABS: ORAL_TABLET | ORAL | 0 refills | Status: DC

## 2022-07-02 NOTE — Telephone Encounter (Signed)
I called to let her know we don't have the results and she will receive feedback once we have them. She stated she is having severe spinal pain that has been ongoing for a couple weeks.

## 2022-07-02 NOTE — Progress Notes (Signed)
Interestingly enough x-ray shows healed right sixth and ninth rib fractures.  There is no way to know how old these fractures are.  But there was no other new abnormality.  My thought is still pleurisy inflammation of the lining  I did not see a reply from the other message today.  Does she have any improvement in her pain since the steroid?.  Has she started gabapentin or using some of her pain medicine at home?  I can send out more steroid for a longer duration if needed

## 2022-07-05 NOTE — Telephone Encounter (Signed)
She said that she did not have any improvement after the steroid shot. She did restart the gabapentin but she has not tried hydrocodone. She mentioned it gives her constipation so she uses that as a very last resort. I misheard her regarding spinal pain. I thought she said spine but she said side. The pain hasn't moved nor improved. I let her know that you sent in prednisone. She advised she would pick it up today.

## 2022-07-06 NOTE — Telephone Encounter (Signed)
Pt advised.

## 2022-07-12 ENCOUNTER — Other Ambulatory Visit: Payer: Self-pay | Admitting: Medical

## 2022-07-12 ENCOUNTER — Telehealth: Payer: Self-pay | Admitting: Medical

## 2022-07-12 MED ORDER — PREGABALIN 25 MG PO CAPS: 25.0000 mg | ORAL_CAPSULE | Freq: Two times a day (BID) | ORAL | 0 refills | Status: DC

## 2022-07-12 NOTE — Telephone Encounter (Signed)
Left message for pt to call me back 

## 2022-07-12 NOTE — Telephone Encounter (Signed)
Pt was notified, pt is taking gabapentin 1 tablet daily or at bedtime she will take 2-3 tablets depending on pain. She would like to try lyrica or something else for pain. Please advise

## 2022-07-12 NOTE — Telephone Encounter (Signed)
Emma [name] called and wanted me to tell you that she is not feeling any better and has finished taking all medication you prescribed her.

## 2022-07-13 ENCOUNTER — Other Ambulatory Visit: Payer: Self-pay | Admitting: Medical

## 2022-07-13 ENCOUNTER — Telehealth: Payer: Self-pay | Admitting: Internal Medicine

## 2022-07-13 DIAGNOSIS — R109 Unspecified abdominal pain: Secondary | ICD-10-CM

## 2022-07-13 DIAGNOSIS — R079 Chest pain, unspecified: Secondary | ICD-10-CM

## 2022-07-13 NOTE — Telephone Encounter (Signed)
Pt is notified and coming in tomorrow morning for labs. I have asked caryn to start on getting CT's scheduled

## 2022-07-13 NOTE — Telephone Encounter (Signed)
Left message for pt to call me back 

## 2022-07-13 NOTE — Telephone Encounter (Signed)
Pt can not go to [place] for imaging. Pt needs to be in locally in [place]. Can you schedule her CT scans for .   I will not be here the rest of the week so you can send to Tobie Poet and she can call patient about appointments

## 2022-07-13 NOTE — Telephone Encounter (Signed)
Pt was notified. Pt wants to know that the pain is being masked with medication but could something else be going on. Does she need more testing. Pain is under arm and shoulder and back

## 2022-07-14 ENCOUNTER — Other Ambulatory Visit: Payer: Medicare Other

## 2022-07-16 ENCOUNTER — Telehealth: Payer: Self-pay | Admitting: Medical

## 2022-07-16 NOTE — Telephone Encounter (Signed)
Pt called & states she hasn't been able to get her pain medicine all week,  said pharmacy said was out of it.  Called pharmacy & medication was out of stock, just came in & they will fill now and will be ready in an hour.  Called pt & informed

## 2022-07-16 NOTE — Telephone Encounter (Signed)
Pt.notified

## 2022-07-19 ENCOUNTER — Telehealth: Payer: Self-pay | Admitting: Medical

## 2022-07-19 NOTE — Telephone Encounter (Signed)
Pt was notified of appt change

## 2022-07-19 NOTE — Telephone Encounter (Signed)
Emma [name] called and expresses they wont due her cat scan until 5/8 and she is in pain and wanted one before that. She is asking if you can give her a call because it is urgent.

## 2022-07-20 ENCOUNTER — Other Ambulatory Visit: Payer: Self-pay | Admitting: Gastroenterology

## 2022-07-21 ENCOUNTER — Ambulatory Visit: Payer: BLUE CROSS/BLUE SHIELD

## 2022-07-22 ENCOUNTER — Other Ambulatory Visit: Payer: Self-pay | Admitting: Medical

## 2022-07-22 ENCOUNTER — Ambulatory Visit (HOSPITAL_COMMUNITY)
Admission: RE | Admit: 2022-07-22 | Discharge: 2022-07-22 | Disposition: A | Discharge status: Home / Self Care | Payer: Medicare Other | Source: Ambulatory Visit | Attending: Medical | Admitting: Medical

## 2022-07-22 DIAGNOSIS — R079 Chest pain, unspecified: Secondary | ICD-10-CM

## 2022-07-22 DIAGNOSIS — R109 Unspecified abdominal pain: Secondary | ICD-10-CM

## 2022-07-22 LAB — POCT I-STAT CREATININE: Creatinine, Ser: 2.3 mg/dL — ABNORMAL HIGH (ref 0.44–1.00)

## 2022-07-22 MED ORDER — SODIUM CHLORIDE (PF) 0.9 % IJ SOLN
INTRAMUSCULAR | Status: AC
  Filled 2022-07-22: qty 50

## 2022-07-22 MED ORDER — VALACYCLOVIR HCL 1 G PO TABS: 2000.0000 mg | ORAL_TABLET | Freq: Three times a day (TID) | ORAL | 0 refills | Status: AC

## 2022-07-22 MED ORDER — PREDNISONE 10 MG PO TABS: ORAL_TABLET | ORAL | 0 refills | Status: DC

## 2022-07-22 NOTE — Progress Notes (Signed)
Please call patient about labs.  She went for a lab to check kidney before having scan.  However her kidney marker is significantly increased compared to usual which is abnormal.  She needs to be drinking 80 to 100 ounces of water daily  I need her to stop the following medicines temporarily-stop metformin and [name] temporarily, and if no significant swelling cut back on Lasix short-term  Have her follow-up next week in the office to recheck kidney marker, schedule office visit

## 2022-07-22 NOTE — Progress Notes (Signed)
Please call patient about her results for CT abdomen and chest  There is evidence of healed rib fractures on the right where she was complaining of pain  There is a lot of feces or poop in the colon  In my opinion, this narrows her symptoms down to one of the couple possibilities: Constipation, residual pain from rib fracture and healing, or less likely shingles without obvious shingles rash/postherpetic neuralgia  I certainly recommend she use something to help with the constipation and backed up stool.  So good water intake, fiber intake, and may be try MiraLAX twice a day for a few days until she feels like she has had a sizable bowel movement.  Or she could technically do MiraLAX once an hour x 4 doses to get a good cleanout or even an enema going up the other direction  She already has medication for pain Lyrica and the other pain medicine such as Percocet I believe she has leftover which would be used for postherpetic neuralgia or healing rib fracture  If she is not using Lyrica daily right now she probably should be doing this.  In the off chance there was some type of nonapparent shingles infection which can sometimes happen, I will be glad to put her on Valtrex for a week if desired.    Another option is to increase the Lyrica as she is only on 25 mg which is the lowest dose.  Let me know how she wants to move forward

## 2022-07-28 ENCOUNTER — Ambulatory Visit (HOSPITAL_COMMUNITY): Payer: BLUE CROSS/BLUE SHIELD

## 2022-07-28 ENCOUNTER — Ambulatory Visit (INDEPENDENT_AMBULATORY_CARE_PROVIDER_SITE_OTHER): Payer: Medicare Other | Admitting: Medical

## 2022-07-28 VITALS — BP 130/80 | HR 60 | Wt 230.4 lb

## 2022-07-28 DIAGNOSIS — B028 Zoster with other complications: Secondary | ICD-10-CM

## 2022-07-28 DIAGNOSIS — N1832 Chronic kidney disease, stage 3b: Secondary | ICD-10-CM | POA: Diagnosis not present

## 2022-07-28 DIAGNOSIS — E118 Type 2 diabetes mellitus with unspecified complications: Secondary | ICD-10-CM | POA: Diagnosis not present

## 2022-07-28 DIAGNOSIS — R809 Proteinuria, unspecified: Secondary | ICD-10-CM | POA: Diagnosis not present

## 2022-07-28 DIAGNOSIS — E785 Hyperlipidemia, unspecified: Secondary | ICD-10-CM

## 2022-07-28 DIAGNOSIS — Z7185 Encounter for immunization safety counseling: Secondary | ICD-10-CM

## 2022-07-28 DIAGNOSIS — R0789 Other chest pain: Secondary | ICD-10-CM | POA: Diagnosis not present

## 2022-07-28 DIAGNOSIS — E1169 Type 2 diabetes mellitus with other specified complication: Secondary | ICD-10-CM | POA: Insufficient documentation

## 2022-07-28 NOTE — Progress Notes (Signed)
Subjective:  Emma [name] is a 67 y.o. female who presents for Chief Complaint  Patient presents with   Follow-up    Follow-up on kidney marker. Doing better with pain since being on valtrex and steriod pak     Here for recheck.  I saw her recently for this ongoing right chest wall pain for about 2 months since she had pneumonia roughly 2 months ago.  She has recently tried several medicines to help reduce the pain including gabapentin, Lyrica, prednisone, Tylenol, NSAID, narcotic pain medicine nothing seems to really help.  We did a phone call last week in which I added Valtrex on the off chance there was shingles without obvious rash.  She notes within a day or 2 of starting Valtrex and another round of prednisone the symptoms significantly improved thankfully.  Because of her kidney marker has been abnormal worse than usual recently we had her temporarily stop Lasix, metformin and [name].  She has continued to hold off on these from last week till today.  She notes that she has had problems with [name] getting a lot of yeast infections.  Her kidney doctor wants to keep her on [place].    No other new problems today.  She is nonfasting though  No other aggravating or relieving factors.    No other c/o.   Past Medical History:  Diagnosis Date   Chronic constipation    CKD (chronic kidney disease), stage III (HCC)    Cluster headache syndrome 04/27/2013   Colon polyp 06/11/2011   Most recent 04/23/08, no polyp--mild diverticulosis.  Repeat 10 yrs.   Depression with anxiety 02/22/2008   Qualifier: Diagnosis of  By: Creta Levin CMA (AAMA), Robin     Diabetes mellitus without complication (HCC)    Diverticulosis    GERD (gastroesophageal reflux disease) 06/11/2011   Hypertension    Recently diagnosed   Migraine syndrome    Osteopenia 11/2016   bone density. plan repeat in 11/2018   Overweight(278.02) 06/11/2011   PUD (peptic ulcer disease) 04/23/2008    Multiple antrum ulcers; h pylori neg   Rheumatoid arthritis(714.0)    Current Outpatient Medications on File Prior to Visit  Medication Sig Dispense Refill   acetaminophen (TYLENOL) 500 MG tablet Take 1,500 mg by mouth once as needed for mild pain or moderate pain.      albuterol (VENTOLIN HFA) 108 (90 Base) MCG/ACT inhaler TAKE 2 PUFFS BY MOUTH EVERY 6 HOURS AS NEEDED FOR WHEEZE OR SHORTNESS OF BREATH 18 each 0   ALPRAZolam (XANAX) 0.5 MG tablet TAKE 1 TABLET BY MOUTH EVERYDAY AT BEDTIME 30 tablet 1   AMBULATORY NON FORMULARY MEDICATION Diltiazem gel with 2% lidocaine  Apply a pea sized amount into your rectum three times daily until healed Dispense 30 GM zero refill 30 g 0   BIOTIN PO Take 1 capsule by mouth daily.      busPIRone (BUSPAR) 7.5 MG tablet Take 1 tablet (7.5 mg total) by mouth 2 (two) times daily. 180 tablet 1   calcitRIOL (ROCALTROL) 0.25 MCG capsule Take 0.25 mcg by mouth 3 (three) times a week.     calcium carbonate ([facility]S CALCIUM) 1500 (600 Ca) MG TABS tablet TAKE 1 TABLET (600 MG TOTAL) BY MOUTH 2 (TWO) TIMES DAILY WITH A MEAL. 150 tablet 1   carisoprodol (SOMA) 350 MG tablet TAKE 1 TABLET BY MOUTH TWICE A DAY AND AT BEDTIME AS NEEDED FOR MUSCLE SPASMS  0   Cholecalciferol 50 MCG (2000 UT)  CAPS 1 capsule Orally once weekly     dicyclomine (BENTYL) 10 MG capsule TAKE 1-2 CAPSULES (10-20 MG TOTAL) BY MOUTH EVERY 8 (EIGHT) HOURS AS NEEDED FOR SPASMS. 30 capsule 1   diphenhydrAMINE (BENADRYL) 25 mg capsule Take by mouth.     hydroxychloroquine (PLAQUENIL) 200 MG tablet one tablet Orally twice daily for 30 days     [name] 10 MG TABS Take 1 tablet by mouth daily.     methylcellulose (CITRUCEL) oral powder Take 1 packet by mouth daily.     mirtazapine (REMERON) 15 MG tablet TAKE 1/2 OR 1 TABLET BY MOUTH AT BEDTIME 90 tablet 1   Multiple Vitamins-Iron (MULTIVITAMINS WITH IRON) TABS tablet Take 1 tablet by mouth daily. 90 tablet 0   omeprazole (PRILOSEC) 40 MG capsule TAKE 1  CAPSULE BY MOUTH EVERY DAY 90 capsule 0   predniSONE (DELTASONE) 10 MG tablet 10mg , 1 tablet daily for 1 week, then 1/2 tablet 5mg  daily for 1 week 14 tablet 0   rosuvastatin (CRESTOR) 20 MG tablet TAKE 1 TABLET BY MOUTH EVERY DAY 90 tablet 1   verapamil (CALAN-SR) 240 MG CR tablet TAKE 1 TABLET BY MOUTH EVERYDAY AT BEDTIME 90 tablet 0   furosemide (LASIX) 20 MG tablet TAKE 1 TABLET BY MOUTH EVERY DAY (Patient not taking: Reported on 07/28/2022) 90 tablet 1   [name] 10 MG TABS tablet Take 10 mg by mouth every morning. (Patient not taking: Reported on 07/28/2022)     metFORMIN (GLUCOPHAGE) 500 MG tablet TAKE 1 TABLET BY MOUTH EVERY DAY WITH BREAKFAST (Patient not taking: Reported on 07/28/2022) 90 tablet 0   Rectal Protectant-Emollient (CALMOL-4) 76-10 % SUPP Use as directed once to twice daily 6 suppository 0   No current facility-administered medications on file prior to visit.     The following portions of the patient's history were reviewed and updated as appropriate: allergies, current medications, past family history, past medical history, past social history, past surgical history and problem list.  ROS Otherwise as in subjective above  Objective: BP 130/80   Pulse 60   Wt 230 lb 6.4 oz (104.5 kg)   BMI 36.09 kg/m   General appearance: alert, no distress, well developed, well nourished Neck: supple, no lymphadenopathy, no thyromegaly, no masses Heart: RRR, normal S1, S2, no murmurs Lungs: CTA bilaterally, no wheezes, rhonchi, or rales Chest: Still tender over the right lateral lower chest wall without obvious deformity or rash Abdomen: +bs, soft, non tender, non distended, no masses, no hepatomegaly, no splenomegaly Pulses: 2+ radial pulses, 1+ pedal pulses, normal cap refill Ext: no edema  Diabetic Foot Exam - Simple   Simple Foot Form Diabetic Foot exam was performed with the following findings: Yes 07/28/2022  1:08 PM  Visual Inspection See comments: Yes Sensation  Testing Intact to touch and monofilament testing bilaterally: Yes Pulse Check See comments: Yes Comments 1+ pedal pulses, flatfeet, spider veins of both feet,       Assessment: Encounter Diagnoses  Name Primary?   Diabetes mellitus with complication (HCC) Yes   Microalbuminuria    Stage 3b chronic kidney disease (HCC)    Chest wall pain    Herpes zoster with complication    Vaccine counseling    Hyperlipidemia associated with type 2 diabetes mellitus (HCC)      Plan: Diabetes Updated labs today She had been on metformin and [name] but we temporarily stopped this since last week when her kidney marker had went higher We may need to consider  other medication going forward, to limit insult to the kidney I recommend that she check her sugars when she is not doing currently  History of microalbuminuria and CKD 3 She sees nephrology but had a recent bump in her creatinine again.  Last week I had her discontinue temporarily Lasix [name] and metformin to give a chance for her kidneys to rebound Update labs today She will continue off Lasix [name] and metformin until we call back tomorrow  Hyperlipidemia associated with diabetes-she is compliant with Crestor but nonfasting today.  Due for labs.  Advised that we need to do a fasting lipid panel soon  She has been battling a chest wall pain for several months.  We recently had a CT chest that shows some healing rib fractures but no other abnormality.  Her exam was out of proportion to the symptoms.  We had tried several different medications including narcotics, gabapentin, Lyrica, ibuprofen, Tylenol and nothing seem to help.  As of last week though we started a round of Valtrex and another round of prednisone.  She notes within days of starting the Valtrex there was an improvement in symptoms.  Thus I suspect she may have had shingles without an obvious rash presentation.  She does note improvement at this point thankfully after  weeks of worsening pain despite several medication trials  Vaccine counseling-I recommended an updated Tdap and RSV vaccine.  She will consider getting it at her pharmacy    Laderricka was seen today for follow-up.  Diagnoses and all orders for this visit:  Diabetes mellitus with complication (HCC) -     Comprehensive metabolic panel -     Hemoglobin A1c -     Microalbumin/Creatinine Ratio, Urine  Microalbuminuria  Stage 3b chronic kidney disease (HCC) -     Comprehensive metabolic panel  Chest wall pain  Herpes zoster with complication  Vaccine counseling  Hyperlipidemia associated with type 2 diabetes mellitus (HCC)    Follow up: pending labs

## 2022-07-29 ENCOUNTER — Other Ambulatory Visit: Payer: Self-pay | Admitting: Medical

## 2022-07-29 LAB — COMPREHENSIVE METABOLIC PANEL
ALT: 13 IU/L (ref 0–32)
AST: 11 IU/L (ref 0–40)
Albumin/Globulin Ratio: 1.6 (ref 1.2–2.2)
Albumin: 4.2 g/dL (ref 3.9–4.9)
Alkaline Phosphatase: 80 IU/L (ref 44–121)
BUN/Creatinine Ratio: 24 (ref 12–28)
BUN: 46 mg/dL — ABNORMAL HIGH (ref 8–27)
Bilirubin Total: 0.2 mg/dL (ref 0.0–1.2)
CO2: 19 mmol/L — ABNORMAL LOW (ref 20–29)
Calcium: 9.5 mg/dL (ref 8.7–10.3)
Chloride: 101 mmol/L (ref 96–106)
Creatinine, Ser: 1.88 mg/dL — ABNORMAL HIGH (ref 0.57–1.00)
Globulin, Total: 2.7 g/dL (ref 1.5–4.5)
Glucose: 292 mg/dL — ABNORMAL HIGH (ref 70–99)
Potassium: 4.4 mmol/L (ref 3.5–5.2)
Sodium: 138 mmol/L (ref 134–144)
Total Protein: 6.9 g/dL (ref 6.0–8.5)
eGFR: 29 mL/min/{1.73_m2} — ABNORMAL LOW (ref >59)

## 2022-07-29 LAB — MICROALBUMIN / CREATININE URINE RATIO
Creatinine, Urine: 46.7 mg/dL
Microalb/Creat Ratio: 60 mg/g creat — ABNORMAL HIGH (ref 0–29)
Microalbumin, Urine: 27.8 ug/mL

## 2022-07-29 LAB — HEMOGLOBIN A1C
Est. average glucose Bld gHb Est-mCnc: 177 mg/dL
Hgb A1c MFr Bld: 7.8 % — ABNORMAL HIGH (ref 4.8–5.6)

## 2022-07-29 MED ORDER — INSULIN GLARGINE (1 UNIT DIAL) 300 UNIT/ML ~~LOC~~ SOPN: 8.0000 [IU] | PEN_INJECTOR | Freq: Every day | SUBCUTANEOUS | 0 refills | Status: DC

## 2022-07-29 NOTE — Progress Notes (Signed)
Blood sugar quite high at 292.  Kidney marker only a little bit improved.  Electrolytes okay.  Hemoglobin A1c diabetes marker 7.8% not at goal.  Still pending microalbumin marker.  Given the kidney marker lets stop metformin altogether.  I want to begin you on a once daily long-acting insulin.  Lets start at 8 units daily.  Go ahead and start back on [name] as well as long as you are drinking plenty of water throughout the day  Only use the Lasix if significant swelling.  Lets recheck in 1 month to see how you are doing with this regimen.  You need to be checking your blood sugars.  We need to make sure you are not going low anywhere close to 70 or below  Also, any medicine it is as needed or not essential, needs to be discussed with the doctor that prescribes that medication to help decrease any other insults to the kidney.  So you may want to have a discussion with your rheumatoid doctor about any medications that could be renewed particularly if they are not playing a big role in providing a benefit  Sabrina-please schedule I month follow-up

## 2022-07-30 ENCOUNTER — Other Ambulatory Visit: Payer: Self-pay | Admitting: Medical

## 2022-07-30 ENCOUNTER — Other Ambulatory Visit: Payer: Self-pay | Admitting: Internal Medicine

## 2022-07-30 MED ORDER — BD PEN NEEDLE NANO U/F 32G X 4 MM MISC: 1.0000 | Freq: Every day | 1 refills | Status: DC

## 2022-08-02 ENCOUNTER — Ambulatory Visit: Payer: Medicare Other | Admitting: Physical Therapy

## 2022-08-02 ENCOUNTER — Ambulatory Visit: Payer: Medicare Other | Admitting: Medical

## 2022-08-03 ENCOUNTER — Telehealth: Payer: Self-pay | Admitting: Medical

## 2022-08-03 NOTE — Telephone Encounter (Signed)
Fax recv'd from [facility] of [employer] needle 32 gx5/32 not covered  please switch to [employer] needle or Assure ID pen needle

## 2022-08-03 NOTE — Telephone Encounter (Signed)
Recv'd fax that [employer] is not on formulary, checked website & states is preferred brand at $35 a month.  Called pharmacy & went thru for $105 for 3 months which is $35 a month.

## 2022-08-03 NOTE — Telephone Encounter (Signed)
This was already switched on 07/30/22

## 2022-08-10 ENCOUNTER — Other Ambulatory Visit: Payer: Self-pay | Admitting: *Deleted

## 2022-08-10 ENCOUNTER — Encounter: Payer: Self-pay | Admitting: Medical

## 2022-08-10 ENCOUNTER — Ambulatory Visit (INDEPENDENT_AMBULATORY_CARE_PROVIDER_SITE_OTHER): Payer: Medicare Other | Admitting: Medical

## 2022-08-10 ENCOUNTER — Ambulatory Visit: Payer: Medicare Other | Admitting: Physical Therapy

## 2022-08-10 VITALS — BP 150/80 | HR 88 | Ht 67.0 in | Wt 238.0 lb

## 2022-08-10 DIAGNOSIS — I10 Essential (primary) hypertension: Secondary | ICD-10-CM

## 2022-08-10 DIAGNOSIS — M7989 Other specified soft tissue disorders: Secondary | ICD-10-CM

## 2022-08-10 DIAGNOSIS — N1832 Chronic kidney disease, stage 3b: Secondary | ICD-10-CM

## 2022-08-10 DIAGNOSIS — E1169 Type 2 diabetes mellitus with other specified complication: Secondary | ICD-10-CM

## 2022-08-10 DIAGNOSIS — Z79899 Other long term (current) drug therapy: Secondary | ICD-10-CM

## 2022-08-10 DIAGNOSIS — E785 Hyperlipidemia, unspecified: Secondary | ICD-10-CM

## 2022-08-10 DIAGNOSIS — E118 Type 2 diabetes mellitus with unspecified complications: Secondary | ICD-10-CM | POA: Diagnosis not present

## 2022-08-10 MED ORDER — BLOOD GLUCOSE TEST VI STRP: 1.0000 | ORAL_STRIP | Freq: Every day | 2 refills | Status: DC

## 2022-08-10 MED ORDER — BLOOD GLUCOSE MONITORING SUPPL DEVI: 1.0000 | Freq: Every day | 0 refills | Status: AC

## 2022-08-10 NOTE — Progress Notes (Unsigned)
Subjective: Chief Complaint  Patient presents with   Follow-up    Follow up on labs.    Here for follow-up on medications, lab results and concerns.  She does not have her pill bottles with her today.  In the last few weeks we have been working with her to deal with a pain.  She was having this ongoing chest pain on the right side.  Prior x-ray from a few months ago showed old rib fractures that were healing, but despite several different treatment regimens she was not getting any relief.  She recently did a round of prednisone steroid and a shot of steroids to help with suspected shingles infection.  She had pain like shingles on the right chest wall without obvious rash.  The steroids did not help.  She did not want to continue taking an ongoing medicine like gabapentin and neuropathic pain medicine to help with this unusual pain.  She had scans that did not show an obvious other finding to cause the pain.  She went for a CT abdomen pelvis on May 2.  However when she went for pre scan labs to check her creatinine her creatinine had bumped up.  I had her temporarily stop [name] and Lasix as of May 2.  She had been taking metformin, but we went ahead and stop this completely given the kidney function and CKD.  She has not resumed [name] or Lasix since Jul 22, 2022.  She was taking [name] Monday Wednesday Friday and [name] Tuesday Thursday and Saturday per nephrology  She is still taking [place].  She came in for recheck last week and had labs.  Here to discuss labs including blood sugar is way higher than her typical.  She does not currently have a glucometer and testing supplies.  She would like to use the freestyle libre device.  She is a new grandmother as of this week.  Drinks about 8 glasses of water daily.  She stopped soda recently.  She has been a little worse with her diet of late.  Regarding blood pressure, she is not 100% sure about lisinopril although the kidney  notes report lisinopril.  She says she is taking verapamil  No other aggravating or relieving factors. No other complaint.    Past Medical History:  Diagnosis Date   Chronic constipation    CKD (chronic kidney disease), stage III (HCC)    Cluster headache syndrome 04/27/2013   Colon polyp 06/11/2011   Most recent 04/23/08, no polyp--mild diverticulosis.  Repeat 10 yrs.   Depression with anxiety 02/22/2008   Qualifier: Diagnosis of  By: Creta Levin CMA (AAMA), Robin     Diabetes mellitus without complication (HCC)    Diverticulosis    GERD (gastroesophageal reflux disease) 06/11/2011   Hypertension    Recently diagnosed   Migraine syndrome    Osteopenia 11/2016   bone density. plan repeat in 11/2018   Overweight(278.02) 06/11/2011   PUD (peptic ulcer disease) 04/23/2008   Multiple antrum ulcers; h pylori neg   Rheumatoid arthritis(714.0)    Current Outpatient Medications on File Prior to Visit  Medication Sig Dispense Refill   ALPRAZolam (XANAX) 0.5 MG tablet TAKE 1 TABLET BY MOUTH EVERYDAY AT BEDTIME 30 tablet 1   busPIRone (BUSPAR) 7.5 MG tablet Take 1 tablet (7.5 mg total) by mouth 2 (two) times daily. 180 tablet 1   calcitRIOL (ROCALTROL) 0.25 MCG capsule Take 0.25 mcg by mouth 3 (three) times a week.     calcium carbonate (CVS  CALCIUM) 1500 (600 Ca) MG TABS tablet TAKE 1 TABLET (600 MG TOTAL) BY MOUTH 2 (TWO) TIMES DAILY WITH A MEAL. 150 tablet 1   Cholecalciferol 50 MCG (2000 UT) CAPS 1 capsule Orally once weekly     [name] 10 MG TABS Take 1 tablet by mouth daily.     methylcellulose (CITRUCEL) oral powder Take 1 packet by mouth daily.     mirtazapine (REMERON) 15 MG tablet TAKE 1/2 OR 1 TABLET BY MOUTH AT BEDTIME 90 tablet 1   Multiple Vitamins-Iron (MULTIVITAMINS WITH IRON) TABS tablet Take 1 tablet by mouth daily. 90 tablet 0   omeprazole (PRILOSEC) 40 MG capsule TAKE 1 CAPSULE BY MOUTH EVERY DAY 90 capsule 0   rosuvastatin (CRESTOR) 20 MG tablet TAKE 1 TABLET BY MOUTH  EVERY DAY 90 tablet 1   verapamil (CALAN-SR) 240 MG CR tablet TAKE 1 TABLET BY MOUTH EVERYDAY AT BEDTIME 90 tablet 0   acetaminophen (TYLENOL) 500 MG tablet Take 1,500 mg by mouth once as needed for mild pain or moderate pain.  (Patient not taking: Reported on 08/10/2022)     AMBULATORY NON FORMULARY MEDICATION Diltiazem gel with 2% lidocaine  Apply a pea sized amount into your rectum three times daily until healed Dispense 30 GM zero refill (Patient not taking: Reported on 08/10/2022) 30 g 0   carisoprodol (SOMA) 350 MG tablet TAKE 1 TABLET BY MOUTH TWICE A DAY AND AT BEDTIME AS NEEDED FOR MUSCLE SPASMS (Patient not taking: Reported on 08/10/2022)  0   dicyclomine (BENTYL) 10 MG capsule TAKE 1-2 CAPSULES (10-20 MG TOTAL) BY MOUTH EVERY 8 (EIGHT) HOURS AS NEEDED FOR SPASMS. (Patient not taking: Reported on 08/10/2022) 30 capsule 1   diphenhydrAMINE (BENADRYL) 25 mg capsule Take by mouth. (Patient not taking: Reported on 08/10/2022)     furosemide (LASIX) 20 MG tablet TAKE 1 TABLET BY MOUTH EVERY DAY (Patient not taking: Reported on 07/28/2022) 90 tablet 1   [name] 10 MG TABS tablet Take 10 mg by mouth every morning. (Patient not taking: Reported on 07/28/2022)     Rectal Protectant-Emollient (CALMOL-4) 76-10 % SUPP Use as directed once to twice daily (Patient not taking: Reported on 08/10/2022) 6 suppository 0   No current facility-administered medications on file prior to visit.   ROS as in subjective   Objective: BP (!) 150/80   Pulse 88   Ht 5\' 7"  (1.702 m)   Wt 238 lb (108 kg)   BMI 37.28 kg/m   Wt Readings from Last 3 Encounters:  08/10/22 238 lb (108 kg)  07/28/22 230 lb 6.4 oz (104.5 kg)  06/30/22 232 lb 3.2 oz (105.3 kg)   BP: (!) 150/80  BP Readings from Last 3 Encounters:  08/10/22 (!) 150/80  07/28/22 130/80  06/30/22 110/70   Gen: wd, wn, nad 1+ nonpitting lower extremity edema today     Assessment: Encounter Diagnoses  Name Primary?   Diabetes mellitus with  complication (HCC) Yes   Essential hypertension    High risk medication use    Hyperlipidemia associated with type 2 diabetes mellitus (HCC)    Stage 3b chronic kidney disease (HCC)    Leg swelling     Plan:  Diabetes prior to a month or so ago she was on metformin daily and not really having problems with blood sugar.  Recently she had to go on prednisone steroid shot to help with this unusual pain that we felt to be related to shingles without obvious shingles rash.  Her  recent blood sugars were close to 300 and her recent hemoglobin A1c is 7.8%.  When we called last week I recommended adding some short-term insulin, but she declines does not want to be on insulin.  Blood sugar today 239. As of today we will start back/resume on her [name] 10 mg.  She was taking this 3 days a week per nephrology.  Script for glucometer and testing supplies today.  She will call back in 1 week with glucose readings. In 1 week if glucose not at goal or getting much better, we may initiate [place] Continue to work on improving diet as we discussed today   Hypertension I was reviewing back over her last nephrology notes from 06/25/2022.  Their note shows that she was prescribed lisinopril, but our notes show ongoing treatment with verapamil. She is not 100% sure she is taking lisinopril.  She does endorse taking verapamil We called her pharmacy and she had picked up verapamil on June 19, 2022, and she picked up lisinopril on April 29, 2022 but she is not 100% sure she is taking lisinopril We will communicate with nephrology about this discrepancy and get this worked out I asked her to call our office back tomorrow just to confirm whether she is taking lisinopril or not   CKD 3 Recent worse creatinine, but her levels improved back to baseline after discontinuing Lasix and temporarily holding on [name].  We also discontinued metformin 07/22/2022 in general given her CKD  Hyperlipidemia Continue  Crestor rosuvastatin 20 mg daily   Leg swelling is minimal today Discontinue Lasix Try to get exercise regularly and consider wearing compression hose more regularly Limit salt   Derianna was seen today for follow-up.  Diagnoses and all orders for this visit:  Diabetes mellitus with complication (HCC) -     Glucose (CBG)  Essential hypertension  High risk medication use  Hyperlipidemia associated with type 2 diabetes mellitus (HCC)  Stage 3b chronic kidney disease (HCC)  Leg swelling    F/u pending call back tomorrow

## 2022-08-11 ENCOUNTER — Telehealth: Payer: Self-pay | Admitting: Medical

## 2022-08-11 LAB — GLUCOSE, POCT (MANUAL RESULT ENTRY): POC Glucose: 239 mg/dl — AB (ref 70–99)

## 2022-08-11 NOTE — Telephone Encounter (Signed)
Left message for pt to call me back 

## 2022-08-11 NOTE — Telephone Encounter (Signed)
Pt is not taking Lisinopril as kidney doctor put this on hold.   She is taking verampril though

## 2022-08-11 NOTE — Progress Notes (Signed)
Sent fax over to [facility] [employer]

## 2022-08-11 NOTE — Telephone Encounter (Signed)
Pt called and states that she was to call back and advise what bp med she was taking. She is taking lisinopril 30mg . She states she needs to know if she is to continue taking that or what she needs to be on once kidney dr is consulted. Pt can be reached at [phone].

## 2022-08-13 NOTE — Telephone Encounter (Signed)
Left detailed message for pt to contact kidney specialist to find out which med they want her taking for her BP as we have not heard back yet

## 2022-08-17 ENCOUNTER — Ambulatory Visit: Payer: Medicare Other | Attending: Gastroenterology | Admitting: Physical Therapy

## 2022-08-17 DIAGNOSIS — R293 Abnormal posture: Secondary | ICD-10-CM | POA: Diagnosis present

## 2022-08-17 DIAGNOSIS — M5459 Other low back pain: Secondary | ICD-10-CM | POA: Insufficient documentation

## 2022-08-17 DIAGNOSIS — M62838 Other muscle spasm: Secondary | ICD-10-CM | POA: Diagnosis present

## 2022-08-17 NOTE — Therapy (Signed)
OUTPATIENT PHYSICAL THERAPY FEMALE PELVIC TREATMENT   Patient Name: Emma [name] MRN: [id number] DOB:1955/07/20, 67 y.o., female Today's Date: 08/17/2022  END OF SESSION:  PT End of Session - 08/17/22 1445     Visit Number 2    Date for PT Re-Evaluation 09/06/22    Authorization Type Medicare A/B    PT Start Time 1443    PT Stop Time 1525    PT Time Calculation (min) 42 min    Activity Tolerance Patient tolerated treatment well    Behavior During Therapy [facility] for tasks assessed/performed              Past Medical History:  Diagnosis Date   Chronic constipation    CKD (chronic kidney disease), stage III (HCC)    Cluster headache syndrome 04/27/2013   Colon polyp 06/11/2011   Most recent 04/23/08, no polyp--mild diverticulosis.  Repeat 10 yrs.   Depression with anxiety 02/22/2008   Qualifier: Diagnosis of  By: Creta Levin CMA (AAMA), Robin     Diabetes mellitus without complication (HCC)    Diverticulosis    GERD (gastroesophageal reflux disease) 06/11/2011   Hypertension    Recently diagnosed   Migraine syndrome    Osteopenia 11/2016   bone density. plan repeat in 11/2018   Overweight(278.02) 06/11/2011   PUD (peptic ulcer disease) 04/23/2008   Multiple antrum ulcers; h pylori neg   Rheumatoid arthritis(714.0)    Past Surgical History:  Procedure Laterality Date   ABDOMINAL HYSTERECTOMY     APPENDECTOMY     BACK SURGERY     3 times   CARDIOVASCULAR STRESS TEST  06/22/2011   Normal; EF 65%   CHOLECYSTECTOMY     COLONOSCOPY  04/23/2008   mild diverticulosis, internal hemorrhoids; Dr. Melvia Heaps   ENDOVENOUS ABLATION SAPHENOUS VEIN W/ LASER Left 06/07/2019   endovenous laser ablation left greater saphenous vein and stab phlebectomy < 10 incisions left leg by Cari Caraway MD   ENDOVENOUS ABLATION SAPHENOUS VEIN W/ LASER Right 12/27/2019   eyelid tightening Bilateral    09/2021   LIVER BIOPSY  03/2007   Chronic active hepatitis w/fibrosis--related to hx of  methotrexate treatment.   oral implants  2022   SKIN BIOPSY  08/16/2017   superficial basal cell carcinoma left inferior medial anterior tibia    Patient Active Problem List   Diagnosis Date Noted   Hyperlipidemia associated with type 2 diabetes mellitus (HCC) 07/28/2022   Pain in both lower extremities 08/03/2021   Leg swelling 08/03/2021   Bilateral lower extremity edema 03/27/2021   Need for pneumococcal vaccination 02/19/2021   Screening for heart disease 02/19/2021   LVH (left ventricular hypertrophy) 02/19/2021   Chronic insomnia 02/19/2021   Emotional disorder (HCC) 01/29/2021   Meibomianitis of left eye 06/23/2020   Allergic conjunctivitis 06/23/2020   Allergic rhinitis due to pollen 06/23/2020   Rash 06/23/2020   Stage 3b chronic kidney disease (HCC) 01/28/2020   Varicose veins of both lower extremities 07/10/2019   Anemia 09/27/2018   Decreased pedal pulses 09/27/2018   Chronic migraine 08/22/2018   Medicare annual wellness visit, subsequent 01/23/2018   Osteopenia 12/16/2016   Vaccine counseling 12/16/2016   Vitamin D deficiency 12/16/2016   Microalbuminuria 05/19/2016   Renal insufficiency 05/19/2016   Post-menopausal 05/19/2016   Estrogen deficiency 05/19/2016   Skin lesion 05/19/2016   Polyarthralgia 12/22/2015   Need for prophylactic vaccination against Streptococcus pneumoniae (pneumococcus) 12/22/2015   Need for prophylactic vaccination and inoculation against influenza 12/22/2015  Essential hypertension 03/27/2015   Rheumatoid arthritis (HCC) 03/27/2015   High risk medication use 03/27/2015   Insomnia 03/27/2015   Chronic constipation 03/27/2015   Edema 03/27/2015   History of migraine headaches 03/27/2015   Obesity 03/27/2015   Chronic cluster headache, not intractable 03/27/2015   Chronic venous insufficiency 03/27/2015   Diabetes mellitus with complication (HCC) 03/27/2015   Gastroesophageal reflux disease without esophagitis 03/27/2015   IBS  (irritable bowel syndrome) 03/27/2015   Chest pain of uncertain etiology 06/11/2011   Colon polyp 06/11/2011    PCP: Jac Canavan, PA-C   REFERRING PROVIDER: Benancio Deeds, MD   REFERRING DIAG: fecal incontinence, chronic constipation  THERAPY DIAG:  Other muscle spasm  Abnormal posture  Other low back pain  Rationale for Evaluation and Treatment: Rehabilitation  ONSET DATE: two years is when it started getting worse  SUBJECTIVE:                                                                                                                                                                                           SUBJECTIVE STATEMENT: Pt had vertigo and was pulling her to the Rt.  Currently just bending fwd and getting up fast causes.  Pt had internal shingles and was in a lot of pain so had to cancel last two visits. Currently still having fecal urgency and constipated Fluid intake: Yes: 8 glasses water plus teas etc    PAIN:  Are you having pain? No   PRECAUTIONS: None  WEIGHT BEARING RESTRICTIONS: No  FALLS:  Has patient fallen in last 6 months? No  LIVING ENVIRONMENT: Lives with: lives alone Lives in: House/apartment   OCCUPATION: no  PLOF: Independent  PATIENT GOALS: better control of bowel movement, less urgency, improve constipation  PERTINENT HISTORY:  CKD, chronic constipation, DM, osteopenia, hysterectomy partial,back surgery x3, appendectomy, cholecystectomy, GERD, HTN Sexual abuse: No  BOWEL MOVEMENT: Pain with bowel movement: Yes Type of bowel movement:Type (Bristol Stool Scale) small hard or liquit, Frequency every 2-3 days is the best it has been; , and Strain Yes trying not to Fully empty rectum: No Leakage: Yes: cannot make it with extreme urge sometimes Pads: No Fiber supplement: Yes: citrucel  URINATION: Pain with urination: No Fully empty bladder: No Stream: Strong Urgency: No Frequency: nocturia 1/night Leakage:   no Pads: No  INTERCOURSE: Not assessed  PREGNANCY: Vaginal deliveries 1   PROLAPSE: None   OBJECTIVE:   DIAGNOSTIC FINDINGS:    PATIENT SURVEYS:    PFIQ-7   COGNITION: Overall cognitive status: Within functional limits for tasks assessed     SENSATION: Light touch:  Proprioception:  MUSCLE LENGTH: Hamstrings: Right 70 deg; Left 70 deg Thomas test:   LUMBAR SPECIAL TESTS:  Straight leg raise test: Negative  FUNCTIONAL TESTS:  Single leg with trendelenburg bil and touching legs together  GAIT:  Comments: shortened stride length and mild trendelenburg bil  POSTURE: rounded shoulders, increased lumbar lordosis, increased thoracic kyphosis, and anterior pelvic tilt  PELVIC ALIGNMENT:  LUMBARAROM/PROM:  A/PROM A/PROM  eval  Flexion 50%  Extension   Right lateral flexion   Left lateral flexion   Right rotation   Left rotation    (Blank rows = not tested)  LOWER EXTREMITY ROM:  Passive ROM Right eval Left eval  Hip flexion 90% 90%  Hip extension    Hip abduction    Hip adduction    Hip internal rotation 60% 60%  Hip external rotation 75% 75%  Knee flexion    Knee extension    Ankle dorsiflexion    Ankle plantarflexion    Ankle inversion    Ankle eversion     (Blank rows = not tested)  LOWER EXTREMITY MMT:  MMT Right eval Left eval  Hip flexion    Hip extension    Hip abduction 4/5 4/5  Hip adduction 3/5 3/5  Hip internal rotation    Hip external rotation    Knee flexion    Knee extension    Ankle dorsiflexion    Ankle plantarflexion    Ankle inversion    Ankle eversion     PALPATION:   General  tenting at abdomen about 2 fingers, upper right quadrant tight and tender, lumbar paraspinals tight Rt/Lt                External Perineal Exam NA                             Internal Pelvic Floor NA  Patient confirms identification and approves PT to assess internal pelvic floor and treatment deferred due to pain  PELVIC  MMT:   MMT eval  Vaginal   Internal Anal Sphincter   External Anal Sphincter   Puborectalis   Diastasis Recti Above umbilicus   (Blank rows = not tested)        TONE: NA  PROLAPSE: NA  TODAY'S TREATMENT:                                                                                                                              DATE: 08/17/22   Manual:  Lumbar and gluteals; abdominal ILY massage and fascial release around colon Trigger Point Dry-Needling  Treatment instructions: Expect mild to moderate muscle soreness. S/S of pneumothorax if dry needled over a lung field, and to seek immediate medical attention should they occur. Patient verbalized understanding of these instructions and education.  Patient Consent Given: Yes Education handout provided: Yes Muscles treated: lumbar multifidi and gluteals bil Electrical stimulation performed: No Parameters: N/A Treatment  response/outcome: increased soft tissue length  Therex: Hip rotation Piriformis stretch Knee to chest  Theract: Educated in ILY abdominal massage  PATIENT EDUCATION:  Education details: abdominal massage Person educated: Patient Education method: Programmer, multimedia, Demonstration, Tactile cues, Verbal cues, and Handouts Education comprehension: verbalized understanding and returned demonstration  HOME EXERCISE PROGRAM: Hip rotation and knee to chest  ASSESSMENT:  CLINICAL IMPRESSION: Pt presents to skilled PT for initial f/u. Pt has been sick and had some vertigo so has been a while since evaluation.  No increased dizziness experienced throughout session today.  Pt responded well to dry needling.  She was educated in abdominal massage to continue to work on improved bowel regularity. Pt will benefit from skilled PT to address all impairments and improve function with better bowel control so that she can fully participate in activities.  OBJECTIVE IMPAIRMENTS: decreased coordination, decreased endurance,  decreased ROM, decreased strength, increased muscle spasms, impaired flexibility, impaired tone, postural dysfunction, and pain.   ACTIVITY LIMITATIONS: sleeping, continence, and toileting  PARTICIPATION LIMITATIONS: interpersonal relationship and community activity  PERSONAL FACTORS: 3+ comorbidities: anal fissure, hemorrhoid, CKD, chronic constipation, DM, osteopenia, hysterectomy partial,back surgery x3, appendectomy, cholecystectomy, GERD, HTN  are also affecting patient's functional outcome.   REHAB POTENTIAL: Excellent  CLINICAL DECISION MAKING: Evolving/moderate complexity  EVALUATION COMPLEXITY: Moderate   GOALS: Goals reviewed with patient? Yes  SHORT TERM GOALS: Target date: 07/12/22  Ind with toileting and skin care for bowel movements. Baseline: Goal status: IN PROGRESS  2.  Pt will be able to use a stool for improved bowel movement position. Baseline:  Goal status: INITIAL  LONG TERM GOALS: Target date: 09/06/22  Pt will be independent with advanced HEP to maintain improvements made throughout therapy  Baseline:  Goal status: INITIAL  2.  Pt will report her BMs are more complete due to improved bowel habits and evacuation techniques.  Baseline:  Goal status: INITIAL  3.  Pt will be able to functional actions such as wait 5 minutes on line to use the bathroom without leakage  Baseline:  Goal status: INITIAL  4.  Pt will report 50% less low back pain due to improved coordination of core and pelvic floor muscles during functional activities. Baseline:  Goal status: INITIAL    PLAN:  PT FREQUENCY: 1x/week  PT DURATION: 12 weeks  PLANNED INTERVENTIONS: Therapeutic exercises, Therapeutic activity, Neuromuscular re-education, Balance training, Gait training, Patient/Family education, Self Care, Joint mobilization, Aquatic Therapy, Dry Needling, Electrical stimulation, Cryotherapy, Moist heat, Taping, Biofeedback, Manual therapy, and Re-evaluation  PLAN FOR  NEXT SESSION: f/u on dry needling lumbar and gluteals;  issue medbridge exercises; breathing and core activation, urge techniques and f/u on goals   Junious Silk, PT 08/17/2022, 5:26 PM

## 2022-08-18 ENCOUNTER — Telehealth: Payer: Self-pay | Admitting: Internal Medicine

## 2022-08-18 NOTE — Telephone Encounter (Signed)
Pt called and left vm of BS readings.  May 23rd- [phone] Then today was 110   Patient has still not heard back from [facility] about what BP to take. Just like we haven't heard back either from anyone

## 2022-08-19 NOTE — Telephone Encounter (Signed)
Left message for danissa to call me back  Left message for  location [phone] to call me back to let us know which medication she is suppose to be on or both. I also faxed this today.

## 2022-08-19 NOTE — Telephone Encounter (Signed)
Spoke to patient and advised her that we are still trying to reach kidney specialist

## 2022-08-21 ENCOUNTER — Other Ambulatory Visit: Payer: Self-pay | Admitting: Medical

## 2022-08-24 ENCOUNTER — Ambulatory Visit: Payer: Medicare Other | Attending: Gastroenterology | Admitting: Physical Therapy

## 2022-08-24 DIAGNOSIS — M62838 Other muscle spasm: Secondary | ICD-10-CM | POA: Diagnosis present

## 2022-08-24 DIAGNOSIS — M5459 Other low back pain: Secondary | ICD-10-CM | POA: Diagnosis present

## 2022-08-24 DIAGNOSIS — R293 Abnormal posture: Secondary | ICD-10-CM | POA: Diagnosis present

## 2022-08-24 NOTE — Telephone Encounter (Signed)
Dr. Celso Amy is no longer at [facility] in [place], the Staff does not know the new location that he is at. Patient was last seen in April 2024 and I asked who is taking over his patient care. She could not give me that information as she said most patients went with him.  I do not know what else to do as I do not know where he is located and she couldn't give me anymore information

## 2022-08-24 NOTE — Telephone Encounter (Signed)
Pt has an appt with Dr. Wolfgang Phoenix next week and she will ask him about the BP then  He is at a new location:  [address], [place], [address]  [phone]

## 2022-08-24 NOTE — Therapy (Signed)
OUTPATIENT PHYSICAL THERAPY FEMALE PELVIC TREATMENT   Patient Name: Emma [name] MRN: [id number] DOB:1955-07-06, 67 y.o., female Today's Date: 08/24/2022  END OF SESSION:  PT End of Session - 08/24/22 1516     Visit Number 3    Date for PT Re-Evaluation 09/06/22    Authorization Type Medicare A/B    PT Start Time 1446    PT Stop Time 1525    PT Time Calculation (min) 39 min    Activity Tolerance Patient tolerated treatment well    Behavior During Therapy [facility] for tasks assessed/performed               Past Medical History:  Diagnosis Date   Chronic constipation    CKD (chronic kidney disease), stage III (HCC)    Cluster headache syndrome 04/27/2013   Colon polyp 06/11/2011   Most recent 04/23/08, no polyp--mild diverticulosis.  Repeat 10 yrs.   Depression with anxiety 02/22/2008   Qualifier: Diagnosis of  By: Creta Levin CMA (AAMA), Robin     Diabetes mellitus without complication (HCC)    Diverticulosis    GERD (gastroesophageal reflux disease) 06/11/2011   Hypertension    Recently diagnosed   Migraine syndrome    Osteopenia 11/2016   bone density. plan repeat in 11/2018   Overweight(278.02) 06/11/2011   PUD (peptic ulcer disease) 04/23/2008   Multiple antrum ulcers; h pylori neg   Rheumatoid arthritis(714.0)    Past Surgical History:  Procedure Laterality Date   ABDOMINAL HYSTERECTOMY     APPENDECTOMY     BACK SURGERY     3 times   CARDIOVASCULAR STRESS TEST  06/22/2011   Normal; EF 65%   CHOLECYSTECTOMY     COLONOSCOPY  04/23/2008   mild diverticulosis, internal hemorrhoids; Dr. Melvia Heaps   ENDOVENOUS ABLATION SAPHENOUS VEIN W/ LASER Left 06/07/2019   endovenous laser ablation left greater saphenous vein and stab phlebectomy < 10 incisions left leg by Cari Caraway MD   ENDOVENOUS ABLATION SAPHENOUS VEIN W/ LASER Right 12/27/2019   eyelid tightening Bilateral    09/2021   LIVER BIOPSY  03/2007   Chronic active hepatitis w/fibrosis--related to hx of  methotrexate treatment.   oral implants  2022   SKIN BIOPSY  08/16/2017   superficial basal cell carcinoma left inferior medial anterior tibia    Patient Active Problem List   Diagnosis Date Noted   Hyperlipidemia associated with type 2 diabetes mellitus (HCC) 07/28/2022   Pain in both lower extremities 08/03/2021   Leg swelling 08/03/2021   Bilateral lower extremity edema 03/27/2021   Need for pneumococcal vaccination 02/19/2021   Screening for heart disease 02/19/2021   LVH (left ventricular hypertrophy) 02/19/2021   Chronic insomnia 02/19/2021   Emotional disorder (HCC) 01/29/2021   Meibomianitis of left eye 06/23/2020   Allergic conjunctivitis 06/23/2020   Allergic rhinitis due to pollen 06/23/2020   Rash 06/23/2020   Stage 3b chronic kidney disease (HCC) 01/28/2020   Varicose veins of both lower extremities 07/10/2019   Anemia 09/27/2018   Decreased pedal pulses 09/27/2018   Chronic migraine 08/22/2018   Medicare annual wellness visit, subsequent 01/23/2018   Osteopenia 12/16/2016   Vaccine counseling 12/16/2016   Vitamin D deficiency 12/16/2016   Microalbuminuria 05/19/2016   Renal insufficiency 05/19/2016   Post-menopausal 05/19/2016   Estrogen deficiency 05/19/2016   Skin lesion 05/19/2016   Polyarthralgia 12/22/2015   Need for prophylactic vaccination against Streptococcus pneumoniae (pneumococcus) 12/22/2015   Need for prophylactic vaccination and inoculation against influenza 12/22/2015  Essential hypertension 03/27/2015   Rheumatoid arthritis (HCC) 03/27/2015   High risk medication use 03/27/2015   Insomnia 03/27/2015   Chronic constipation 03/27/2015   Edema 03/27/2015   History of migraine headaches 03/27/2015   Obesity 03/27/2015   Chronic cluster headache, not intractable 03/27/2015   Chronic venous insufficiency 03/27/2015   Diabetes mellitus with complication (HCC) 03/27/2015   Gastroesophageal reflux disease without esophagitis 03/27/2015   IBS  (irritable bowel syndrome) 03/27/2015   Chest pain of uncertain etiology 06/11/2011   Colon polyp 06/11/2011    PCP: Jac Canavan, PA-C   REFERRING PROVIDER: Benancio Deeds, MD   REFERRING DIAG: fecal incontinence, chronic constipation  THERAPY DIAG:  Other muscle spasm  Abnormal posture  Other low back pain  Rationale for Evaluation and Treatment: Rehabilitation  ONSET DATE: two years is when it started getting worse  SUBJECTIVE:                                                                                                                                                                                           SUBJECTIVE STATEMENT: Pt had a "good" week.  I have been going every other day and it has been easier.  Getting more out than I was and less straining. Fluid intake: Yes: 8 glasses water plus teas etc    PAIN:  Are you having pain? No   PRECAUTIONS: None  WEIGHT BEARING RESTRICTIONS: No  FALLS:  Has patient fallen in last 6 months? No  LIVING ENVIRONMENT: Lives with: lives alone Lives in: House/apartment   OCCUPATION: no  PLOF: Independent  PATIENT GOALS: better control of bowel movement, less urgency, improve constipation  PERTINENT HISTORY:  CKD, chronic constipation, DM, osteopenia, hysterectomy partial,back surgery x3, appendectomy, cholecystectomy, GERD, HTN Sexual abuse: No  BOWEL MOVEMENT: Pain with bowel movement: Yes Type of bowel movement:Type (Bristol Stool Scale) small hard or liquit, Frequency every 2-3 days is the best it has been; , and Strain Yes trying not to Fully empty rectum: No Leakage: Yes: cannot make it with extreme urge sometimes Pads: No Fiber supplement: Yes: citrucel  URINATION: Pain with urination: No Fully empty bladder: No Stream: Strong Urgency: No Frequency: nocturia 1/night Leakage:  no Pads: No  INTERCOURSE: Not assessed  PREGNANCY: Vaginal deliveries  1   PROLAPSE: None   OBJECTIVE:   DIAGNOSTIC FINDINGS:    PATIENT SURVEYS:    PFIQ-7   COGNITION: Overall cognitive status: Within functional limits for tasks assessed     SENSATION: Light touch:  Proprioception:   MUSCLE LENGTH: Hamstrings: Right 70 deg; Left 70 deg Thomas test:   LUMBAR SPECIAL TESTS:  Straight leg raise test: Negative  FUNCTIONAL TESTS:  Single leg with trendelenburg bil and touching legs together  GAIT:  Comments: shortened stride length and mild trendelenburg bil  POSTURE: rounded shoulders, increased lumbar lordosis, increased thoracic kyphosis, and anterior pelvic tilt  PELVIC ALIGNMENT:  LUMBARAROM/PROM:  A/PROM A/PROM  eval  Flexion 50%  Extension   Right lateral flexion   Left lateral flexion   Right rotation   Left rotation    (Blank rows = not tested)  LOWER EXTREMITY ROM:  Passive ROM Right eval Left eval  Hip flexion 90% 90%  Hip extension    Hip abduction    Hip adduction    Hip internal rotation 60% 60%  Hip external rotation 75% 75%  Knee flexion    Knee extension    Ankle dorsiflexion    Ankle plantarflexion    Ankle inversion    Ankle eversion     (Blank rows = not tested)  LOWER EXTREMITY MMT:  MMT Right eval Left eval  Hip flexion    Hip extension    Hip abduction 4/5 4/5  Hip adduction 3/5 3/5  Hip internal rotation    Hip external rotation    Knee flexion    Knee extension    Ankle dorsiflexion    Ankle plantarflexion    Ankle inversion    Ankle eversion     PALPATION:   General  tenting at abdomen about 2 fingers, upper right quadrant tight and tender, lumbar paraspinals tight Rt/Lt                External Perineal Exam NA                             Internal Pelvic Floor NA  Patient confirms identification and approves PT to assess internal pelvic floor and treatment deferred due to pain  PELVIC MMT:   MMT eval  Vaginal   Internal Anal Sphincter   External Anal Sphincter    Puborectalis   Diastasis Recti Above umbilicus   (Blank rows = not tested)        TONE: NA  PROLAPSE: NA  TODAY'S TREATMENT:                                                                                                                              DATE: 08/24/22   Manual:  Lumbar and gluteals bilateral STM and TPR Trigger Point Dry-Needling  Treatment instructions: Expect mild to moderate muscle soreness. S/S of pneumothorax if dry needled over a lung field, and to seek immediate medical attention should they occur. Patient verbalized understanding of these instructions and education.  Patient Consent Given: Yes Education handout provided: Yes Muscles treated: lumbar multifidi and gluteals bil Electrical stimulation performed: No Parameters: N/A Treatment response/outcome: increased soft tissue length  Therex: Hip rotation Piriformis stretch 2 ways Knee to chest Butterfly Cat cow standing H/s  standing Quad prone with MET and educated on using strap  PATIENT EDUCATION:  Education details: abdominal massage Person educated: Patient Education method: Explanation, Demonstration, Tactile cues, Verbal cues, and Handouts Education comprehension: verbalized understanding and returned demonstration  HOME EXERCISE PROGRAM: Access Code: [email or web address] URL: [email or web address] Date: 08/24/2022 Prepared by: Dwana Curd  Exercises - Supine Figure 4 Piriformis Stretch  - 1 x daily - 7 x weekly - 1 sets - 3 reps - 30 sec hold - Supine Piriformis Stretch with Leg Straight  - 1 x daily - 7 x weekly - 1 sets - 3 reps - 30 sec hold - Prone Quadriceps Stretch with Strap  - 1 x daily - 7 x weekly - 1 sets - 3 reps - 30sec hold - Supine Butterfly Groin Stretch  - 1 x daily - 7 x weekly - 1 sets - 3 reps - 30 sec hold - Supine Hamstring Stretch with Strap  - 1 x daily - 7 x weekly - 1 sets - 3 reps - 30 sec hold - Standing Hamstring Stretch with Step  - 1 x daily - 7  x weekly - 1 sets - 3 reps - 30 sec hold - Modified Cat Cow on Counter  - 1 x daily - 7 x weekly - 3 sets - 10 reps - Standing Hip Circles  - 1 x daily - 7 x weekly - 3 sets - 10 reps  Patient Education - Trigger Point Dry Needling  ASSESSMENT:  CLINICAL IMPRESSION: Pt presents to skilled PT still having dizziness. Pt denies visual changes, other loss of balance, no difficulty swallowing, and no unusual weakness reported.  Pt states the dizziness is getting better and only lasts a minute after a quick movements but not every time.  Pt was encouraged to tell PCP if it continues, suspected BPPV at this time.  Pt responded well to dry needling at previous visit and was done again today.  States she is having much better bowel movements and more frequent.  Pt was given stretches to continue to address muscle tension and maintain improved soft tissue length from today's treatment. Pt will benefit from skilled PT to address all impairments and improve function with better bowel control so that she can fully participate in activities.  OBJECTIVE IMPAIRMENTS: decreased coordination, decreased endurance, decreased ROM, decreased strength, increased muscle spasms, impaired flexibility, impaired tone, postural dysfunction, and pain.   ACTIVITY LIMITATIONS: sleeping, continence, and toileting  PARTICIPATION LIMITATIONS: interpersonal relationship and community activity  PERSONAL FACTORS: 3+ comorbidities: anal fissure, hemorrhoid, CKD, chronic constipation, DM, osteopenia, hysterectomy partial,back surgery x3, appendectomy, cholecystectomy, GERD, HTN  are also affecting patient's functional outcome.   REHAB POTENTIAL: Excellent  CLINICAL DECISION MAKING: Evolving/moderate complexity  EVALUATION COMPLEXITY: Moderate   GOALS: Goals reviewed with patient? Yes  SHORT TERM GOALS: Target date: 07/12/22  Ind with toileting and skin care for bowel movements. Baseline: Goal status: MET  2.  Pt will be  able to use a stool for improved bowel movement position. Baseline:  Goal status: MET  LONG TERM GOALS: Target date: 09/06/22  Pt will be independent with advanced HEP to maintain improvements made throughout therapy  Baseline:  Goal status: INITIAL  2.  Pt will report her BMs are more complete due to improved bowel habits and evacuation techniques.  Baseline:  Goal status: INITIAL  3.  Pt will be able to functional actions such as wait 5 minutes on line to use the bathroom without leakage  Baseline:  Goal status: INITIAL  4.  Pt will report 50% less low back pain due to improved coordination of core and pelvic floor muscles during functional activities. Baseline:  Goal status: INITIAL    PLAN:  PT FREQUENCY: 1x/week  PT DURATION: 12 weeks  PLANNED INTERVENTIONS: Therapeutic exercises, Therapeutic activity, Neuromuscular re-education, Balance training, Gait training, Patient/Family education, Self Care, Joint mobilization, Aquatic Therapy, Dry Needling, Electrical stimulation, Cryotherapy, Moist heat, Taping, Biofeedback, Manual therapy, and Re-evaluation  PLAN FOR NEXT SESSION: f/u on dry needling lumbar and gluteals; dry needling #3;  breathing and core activation, f/u on bladder urge techniques and f/u on goals   Junious Silk, PT 08/24/2022, 4:07 PM

## 2022-08-26 LAB — HM MAMMOGRAPHY

## 2022-08-26 LAB — HM DEXA SCAN

## 2022-08-27 ENCOUNTER — Encounter: Payer: Self-pay | Admitting: Internal Medicine

## 2022-08-31 ENCOUNTER — Encounter: Payer: Self-pay | Admitting: Physical Therapy

## 2022-08-31 ENCOUNTER — Ambulatory Visit: Payer: Medicare Other | Admitting: Physical Therapy

## 2022-08-31 DIAGNOSIS — M5459 Other low back pain: Secondary | ICD-10-CM

## 2022-08-31 DIAGNOSIS — R293 Abnormal posture: Secondary | ICD-10-CM

## 2022-08-31 DIAGNOSIS — M62838 Other muscle spasm: Secondary | ICD-10-CM | POA: Diagnosis not present

## 2022-08-31 NOTE — Therapy (Signed)
OUTPATIENT PHYSICAL THERAPY FEMALE PELVIC TREATMENT   Patient Name: Emma [name] MRN: [id number] DOB:1955-03-29, 67 y.o., female Today's Date: 08/31/2022  END OF SESSION:  PT End of Session - 08/31/22 1437     Visit Number 4    Date for PT Re-Evaluation 09/06/22    Authorization Type Medicare A/B    PT Start Time 1435    PT Stop Time 1515    PT Time Calculation (min) 40 min    Activity Tolerance Patient tolerated treatment well    Behavior During Therapy [facility] for tasks assessed/performed                Past Medical History:  Diagnosis Date   Chronic constipation    CKD (chronic kidney disease), stage III (HCC)    Cluster headache syndrome 04/27/2013   Colon polyp 06/11/2011   Most recent 04/23/08, no polyp--mild diverticulosis.  Repeat 10 yrs.   Depression with anxiety 02/22/2008   Qualifier: Diagnosis of  By: Creta Levin CMA (AAMA), Robin     Diabetes mellitus without complication (HCC)    Diverticulosis    GERD (gastroesophageal reflux disease) 06/11/2011   Hypertension    Recently diagnosed   Migraine syndrome    Osteopenia 11/2016   bone density. plan repeat in 11/2018   Overweight(278.02) 06/11/2011   PUD (peptic ulcer disease) 04/23/2008   Multiple antrum ulcers; h pylori neg   Rheumatoid arthritis(714.0)    Past Surgical History:  Procedure Laterality Date   ABDOMINAL HYSTERECTOMY     APPENDECTOMY     BACK SURGERY     3 times   CARDIOVASCULAR STRESS TEST  06/22/2011   Normal; EF 65%   CHOLECYSTECTOMY     COLONOSCOPY  04/23/2008   mild diverticulosis, internal hemorrhoids; Dr. Melvia Heaps   ENDOVENOUS ABLATION SAPHENOUS VEIN W/ LASER Left 06/07/2019   endovenous laser ablation left greater saphenous vein and stab phlebectomy < 10 incisions left leg by Cari Caraway MD   ENDOVENOUS ABLATION SAPHENOUS VEIN W/ LASER Right 12/27/2019   eyelid tightening Bilateral    09/2021   LIVER BIOPSY  03/2007   Chronic active hepatitis w/fibrosis--related to hx  of methotrexate treatment.   oral implants  2022   SKIN BIOPSY  08/16/2017   superficial basal cell carcinoma left inferior medial anterior tibia    Patient Active Problem List   Diagnosis Date Noted   Hyperlipidemia associated with type 2 diabetes mellitus (HCC) 07/28/2022   Pain in both lower extremities 08/03/2021   Leg swelling 08/03/2021   Bilateral lower extremity edema 03/27/2021   Need for pneumococcal vaccination 02/19/2021   Screening for heart disease 02/19/2021   LVH (left ventricular hypertrophy) 02/19/2021   Chronic insomnia 02/19/2021   Emotional disorder (HCC) 01/29/2021   Meibomianitis of left eye 06/23/2020   Allergic conjunctivitis 06/23/2020   Allergic rhinitis due to pollen 06/23/2020   Rash 06/23/2020   Stage 3b chronic kidney disease (HCC) 01/28/2020   Varicose veins of both lower extremities 07/10/2019   Anemia 09/27/2018   Decreased pedal pulses 09/27/2018   Chronic migraine 08/22/2018   Medicare annual wellness visit, subsequent 01/23/2018   Osteopenia 12/16/2016   Vaccine counseling 12/16/2016   Vitamin D deficiency 12/16/2016   Microalbuminuria 05/19/2016   Renal insufficiency 05/19/2016   Post-menopausal 05/19/2016   Estrogen deficiency 05/19/2016   Skin lesion 05/19/2016   Polyarthralgia 12/22/2015   Need for prophylactic vaccination against Streptococcus pneumoniae (pneumococcus) 12/22/2015   Need for prophylactic vaccination and inoculation against influenza 12/22/2015  Essential hypertension 03/27/2015   Rheumatoid arthritis (HCC) 03/27/2015   High risk medication use 03/27/2015   Insomnia 03/27/2015   Chronic constipation 03/27/2015   Edema 03/27/2015   History of migraine headaches 03/27/2015   Obesity 03/27/2015   Chronic cluster headache, not intractable 03/27/2015   Chronic venous insufficiency 03/27/2015   Diabetes mellitus with complication (HCC) 03/27/2015   Gastroesophageal reflux disease without esophagitis 03/27/2015   IBS  (irritable bowel syndrome) 03/27/2015   Chest pain of uncertain etiology 06/11/2011   Colon polyp 06/11/2011    PCP: Jac Canavan, PA-C   REFERRING PROVIDER: Benancio Deeds, MD   REFERRING DIAG: fecal incontinence, chronic constipation  THERAPY DIAG:  Other muscle spasm  Abnormal posture  Other low back pain  Rationale for Evaluation and Treatment: Rehabilitation  ONSET DATE: two years is when it started getting worse  SUBJECTIVE:                                                                                                                                                                                           SUBJECTIVE STATEMENT: Pt had no leakage.  Overall, feeling less pain.  Bowel movements were a little slow this week and last one was Saturday.   Fluid intake: Yes: 8 glasses water plus teas etc    PAIN:  Are you having pain? No   PRECAUTIONS: None  WEIGHT BEARING RESTRICTIONS: No  FALLS:  Has patient fallen in last 6 months? No  LIVING ENVIRONMENT: Lives with: lives alone Lives in: House/apartment   OCCUPATION: no  PLOF: Independent  PATIENT GOALS: better control of bowel movement, less urgency, improve constipation  PERTINENT HISTORY:  CKD, chronic constipation, DM, osteopenia, hysterectomy partial,back surgery x3, appendectomy, cholecystectomy, GERD, HTN Sexual abuse: No  BOWEL MOVEMENT: Pain with bowel movement: Yes Type of bowel movement:Type (Bristol Stool Scale) small hard or liquit, Frequency every 2-3 days is the best it has been; , and Strain Yes trying not to Fully empty rectum: No Leakage: Yes: cannot make it with extreme urge sometimes Pads: No Fiber supplement: Yes: citrucel  URINATION: Pain with urination: No Fully empty bladder: No Stream: Strong Urgency: No Frequency: nocturia 1/night Leakage:  no Pads: No  INTERCOURSE: Not assessed  PREGNANCY: Vaginal deliveries 1   PROLAPSE: None   OBJECTIVE:    DIAGNOSTIC FINDINGS:    PATIENT SURVEYS:    PFIQ-7   COGNITION: Overall cognitive status: Within functional limits for tasks assessed     SENSATION: Light touch:  Proprioception:   MUSCLE LENGTH: Hamstrings: Right 70 deg; Left 70 deg Thomas test:   LUMBAR SPECIAL TESTS:  Straight leg  raise test: Negative  FUNCTIONAL TESTS:  Single leg with trendelenburg bil and touching legs together  GAIT:  Comments: shortened stride length and mild trendelenburg bil  POSTURE: rounded shoulders, increased lumbar lordosis, increased thoracic kyphosis, and anterior pelvic tilt  PELVIC ALIGNMENT:  LUMBARAROM/PROM:  A/PROM A/PROM  eval  Flexion 50%  Extension   Right lateral flexion   Left lateral flexion   Right rotation   Left rotation    (Blank rows = not tested)  LOWER EXTREMITY ROM:  Passive ROM Right eval Left eval  Hip flexion 90% 90%  Hip extension    Hip abduction    Hip adduction    Hip internal rotation 60% 60%  Hip external rotation 75% 75%  Knee flexion    Knee extension    Ankle dorsiflexion    Ankle plantarflexion    Ankle inversion    Ankle eversion     (Blank rows = not tested)  LOWER EXTREMITY MMT:  MMT Right eval Left eval  Hip flexion    Hip extension    Hip abduction 4/5 4/5  Hip adduction 3/5 3/5  Hip internal rotation    Hip external rotation    Knee flexion    Knee extension    Ankle dorsiflexion    Ankle plantarflexion    Ankle inversion    Ankle eversion     PALPATION:   General  tenting at abdomen about 2 fingers, upper right quadrant tight and tender, lumbar paraspinals tight Rt/Lt                External Perineal Exam NA                             Internal Pelvic Floor NA  Patient confirms identification and approves PT to assess internal pelvic floor and treatment deferred due to pain  PELVIC MMT:   MMT eval  Vaginal   Internal Anal Sphincter   External Anal Sphincter   Puborectalis   Diastasis Recti  Above umbilicus   (Blank rows = not tested)        TONE: NA  PROLAPSE: NA  TODAY'S TREATMENT:                                                                                                                              DATE: 08/24/22   Manual:  Lumbar and gluteals bilateral STM and TPR and cupping Trigger Point Dry-Needling  Treatment instructions: Expect mild to moderate muscle soreness. S/S of pneumothorax if dry needled over a lung field, and to seek immediate medical attention should they occur. Patient verbalized understanding of these instructions and education.  Patient Consent Given: Yes Education handout provided: Yes Muscles treated: lumbar multifidi and Rt gluteals Electrical stimulation performed: No Parameters: N/A Treatment response/outcome: increased soft tissue length  Therex: Kegel with exhale in supine - yoga block and hip IR Core activated prone -  hip ext and hamstring curl - 10x each  PATIENT EDUCATION:  Education details: abdominal massage Person educated: Patient Education method: Explanation, Demonstration, Tactile cues, Verbal cues, and Handouts Education comprehension: verbalized understanding and returned demonstration  HOME EXERCISE PROGRAM: Access Code: [email or web address] URL: [email or web address] Date: 08/24/2022 Prepared by: Dwana Curd  Exercises - Supine Figure 4 Piriformis Stretch  - 1 x daily - 7 x weekly - 1 sets - 3 reps - 30 sec hold - Supine Piriformis Stretch with Leg Straight  - 1 x daily - 7 x weekly - 1 sets - 3 reps - 30 sec hold - Prone Quadriceps Stretch with Strap  - 1 x daily - 7 x weekly - 1 sets - 3 reps - 30sec hold - Supine Butterfly Groin Stretch  - 1 x daily - 7 x weekly - 1 sets - 3 reps - 30 sec hold - Supine Hamstring Stretch with Strap  - 1 x daily - 7 x weekly - 1 sets - 3 reps - 30 sec hold - Standing Hamstring Stretch with Step  - 1 x daily - 7 x weekly - 1 sets - 3 reps - 30 sec hold - Modified Cat  Cow on Counter  - 1 x daily - 7 x weekly - 3 sets - 10 reps - Standing Hip Circles  - 1 x daily - 7 x weekly - 3 sets - 10 reps  Patient Education - Trigger Point Dry Needling  ASSESSMENT:  CLINICAL IMPRESSION: Pt has been feeling better with decreased pain.  Pt continues to benefit from soft tissue release and able to add core and breathing exercises today.  Pt needed cues to get more abdominal movement towards spine and bring ribcage down.  Pt still reports constipation and will benefit from skilled PT to address impairments to achieve functional goals  OBJECTIVE IMPAIRMENTS: decreased coordination, decreased endurance, decreased ROM, decreased strength, increased muscle spasms, impaired flexibility, impaired tone, postural dysfunction, and pain.   ACTIVITY LIMITATIONS: sleeping, continence, and toileting  PARTICIPATION LIMITATIONS: interpersonal relationship and community activity  PERSONAL FACTORS: 3+ comorbidities: anal fissure, hemorrhoid, CKD, chronic constipation, DM, osteopenia, hysterectomy partial,back surgery x3, appendectomy, cholecystectomy, GERD, HTN  are also affecting patient's functional outcome.   REHAB POTENTIAL: Excellent  CLINICAL DECISION MAKING: Evolving/moderate complexity  EVALUATION COMPLEXITY: Moderate   GOALS: Goals reviewed with patient? Yes  SHORT TERM GOALS: Target date: 07/12/22  Ind with toileting and skin care for bowel movements. Baseline: Goal status: MET  2.  Pt will be able to use a stool for improved bowel movement position. Baseline:  Goal status: MET  LONG TERM GOALS: Target date: 09/06/22 Updated 08/31/22  Pt will be independent with advanced HEP to maintain improvements made throughout therapy  Baseline:  Goal status: IN PROGRESS  2.  Pt will report her BMs are more complete due to improved bowel habits and evacuation techniques.  Baseline:  Goal status: IN PROGRESS  3.  Pt will be able to functional actions such as wait 5  minutes on line to use the bathroom without leakage  Baseline:  Goal status: IN PROGRESS  4.  Pt will report 50% less low back pain due to improved coordination of core and pelvic floor muscles during functional activities. Baseline:  Goal status: IN PROGRESS    PLAN:  PT FREQUENCY: 1x/week  PT DURATION: 12 weeks  PLANNED INTERVENTIONS: Therapeutic exercises, Therapeutic activity, Neuromuscular re-education, Balance training, Gait training, Patient/Family education, Self Care, Joint mobilization, Aquatic Therapy, Dry Needling, Electrical  stimulation, Cryotherapy, Moist heat, Taping, Biofeedback, Manual therapy, and Re-evaluation  PLAN FOR NEXT SESSION: re-eval pelvic floor, f/u on dry needling lumbar and gluteals; dry needling #4;  breathing and core activation,    Brayton Caves Saharah Sherrow, PT 08/31/2022, 2:38 PM

## 2022-09-07 ENCOUNTER — Encounter: Payer: Self-pay | Admitting: Physical Therapy

## 2022-09-07 ENCOUNTER — Ambulatory Visit: Payer: Medicare Other | Admitting: Physical Therapy

## 2022-09-07 DIAGNOSIS — M5459 Other low back pain: Secondary | ICD-10-CM

## 2022-09-07 DIAGNOSIS — R293 Abnormal posture: Secondary | ICD-10-CM

## 2022-09-07 DIAGNOSIS — M62838 Other muscle spasm: Secondary | ICD-10-CM

## 2022-09-07 NOTE — Therapy (Addendum)
OUTPATIENT PHYSICAL THERAPY FEMALE PELVIC TREATMENT   Patient Name: Emma [name] MRN: [id number] DOB:07-08-1955, 67 y.o., female Today's Date: 09/07/2022  END OF SESSION:  PT End of Session - 09/07/22 1552     Visit Number 5    Date for PT Re-Evaluation 11/02/22    Authorization Type Medicare A/B    Progress Note Due on Visit 10    PT Start Time 1450    PT Stop Time 1530    PT Time Calculation (min) 40 min    Activity Tolerance Patient tolerated treatment well    Behavior During Therapy [facility] for tasks assessed/performed                 Past Medical History:  Diagnosis Date   Chronic constipation    CKD (chronic kidney disease), stage III (HCC)    Cluster headache syndrome 04/27/2013   Colon polyp 06/11/2011   Most recent 04/23/08, no polyp--mild diverticulosis.  Repeat 10 yrs.   Depression with anxiety 02/22/2008   Qualifier: Diagnosis of  By: Creta Levin CMA (AAMA), Robin     Diabetes mellitus without complication (HCC)    Diverticulosis    GERD (gastroesophageal reflux disease) 06/11/2011   Hypertension    Recently diagnosed   Migraine syndrome    Osteopenia 11/2016   bone density. plan repeat in 11/2018   Overweight(278.02) 06/11/2011   PUD (peptic ulcer disease) 04/23/2008   Multiple antrum ulcers; h pylori neg   Rheumatoid arthritis(714.0)    Past Surgical History:  Procedure Laterality Date   ABDOMINAL HYSTERECTOMY     APPENDECTOMY     BACK SURGERY     3 times   CARDIOVASCULAR STRESS TEST  06/22/2011   Normal; EF 65%   CHOLECYSTECTOMY     COLONOSCOPY  04/23/2008   mild diverticulosis, internal hemorrhoids; Dr. Melvia Heaps   ENDOVENOUS ABLATION SAPHENOUS VEIN W/ LASER Left 06/07/2019   endovenous laser ablation left greater saphenous vein and stab phlebectomy < 10 incisions left leg by Cari Caraway MD   ENDOVENOUS ABLATION SAPHENOUS VEIN W/ LASER Right 12/27/2019   eyelid tightening Bilateral    09/2021   LIVER BIOPSY  03/2007   Chronic active  hepatitis w/fibrosis--related to hx of methotrexate treatment.   oral implants  2022   SKIN BIOPSY  08/16/2017   superficial basal cell carcinoma left inferior medial anterior tibia    Patient Active Problem List   Diagnosis Date Noted   Hyperlipidemia associated with type 2 diabetes mellitus (HCC) 07/28/2022   Pain in both lower extremities 08/03/2021   Leg swelling 08/03/2021   Bilateral lower extremity edema 03/27/2021   Need for pneumococcal vaccination 02/19/2021   Screening for heart disease 02/19/2021   LVH (left ventricular hypertrophy) 02/19/2021   Chronic insomnia 02/19/2021   Emotional disorder (HCC) 01/29/2021   Meibomianitis of left eye 06/23/2020   Allergic conjunctivitis 06/23/2020   Allergic rhinitis due to pollen 06/23/2020   Rash 06/23/2020   Stage 3b chronic kidney disease (HCC) 01/28/2020   Varicose veins of both lower extremities 07/10/2019   Anemia 09/27/2018   Decreased pedal pulses 09/27/2018   Chronic migraine 08/22/2018   Medicare annual wellness visit, subsequent 01/23/2018   Osteopenia 12/16/2016   Vaccine counseling 12/16/2016   Vitamin D deficiency 12/16/2016   Microalbuminuria 05/19/2016   Renal insufficiency 05/19/2016   Post-menopausal 05/19/2016   Estrogen deficiency 05/19/2016   Skin lesion 05/19/2016   Polyarthralgia 12/22/2015   Need for prophylactic vaccination against Streptococcus pneumoniae (pneumococcus) 12/22/2015  Need for prophylactic vaccination and inoculation against influenza 12/22/2015   Essential hypertension 03/27/2015   Rheumatoid arthritis (HCC) 03/27/2015   High risk medication use 03/27/2015   Insomnia 03/27/2015   Chronic constipation 03/27/2015   Edema 03/27/2015   History of migraine headaches 03/27/2015   Obesity 03/27/2015   Chronic cluster headache, not intractable 03/27/2015   Chronic venous insufficiency 03/27/2015   Diabetes mellitus with complication (HCC) 03/27/2015   Gastroesophageal reflux disease  without esophagitis 03/27/2015   IBS (irritable bowel syndrome) 03/27/2015   Chest pain of uncertain etiology 06/11/2011   Colon polyp 06/11/2011    PCP: Jac Canavan, PA-C   REFERRING PROVIDER: Benancio Deeds, MD   REFERRING DIAG: fecal incontinence, chronic constipation  THERAPY DIAG:  Other muscle spasm - Plan: PT plan of care cert/re-cert  Abnormal posture - Plan: PT plan of care cert/re-cert  Other low back pain - Plan: PT plan of care cert/re-cert  Rationale for Evaluation and Treatment: Rehabilitation  ONSET DATE: two years is when it started getting worse  SUBJECTIVE:                                                                                                                                                                                           SUBJECTIVE STATEMENT: Pt had no leakage.  Overall, feeling less pain.  Bowel movements were a little slow this week and last one was Saturday.   Fluid intake: Yes: 8 glasses water plus teas etc    PAIN:  Are you having pain? No   PRECAUTIONS: None  WEIGHT BEARING RESTRICTIONS: No  FALLS:  Has patient fallen in last 6 months? No  LIVING ENVIRONMENT: Lives with: lives alone Lives in: House/apartment   OCCUPATION: no  PLOF: Independent  PATIENT GOALS: better control of bowel movement, less urgency, improve constipation  PERTINENT HISTORY:  CKD, chronic constipation, DM, osteopenia, hysterectomy partial,back surgery x3, appendectomy, cholecystectomy, GERD, HTN Sexual abuse: No  BOWEL MOVEMENT: Pain with bowel movement: Yes Type of bowel movement:Type (Bristol Stool Scale) small hard or liquit, Frequency every 2-3 days is the best it has been; , and Strain Yes trying not to Fully empty rectum: No Leakage: Yes: cannot make it with extreme urge sometimes Pads: No Fiber supplement: Yes: citrucel  URINATION: Pain with urination: No Fully empty bladder: No Stream: Strong Urgency: No Frequency:  nocturia 1/night Leakage:  no Pads: No  INTERCOURSE: Not assessed  PREGNANCY: Vaginal deliveries 1   PROLAPSE: None   OBJECTIVE:   DIAGNOSTIC FINDINGS:    PATIENT SURVEYS:    PFIQ-7   COGNITION: Overall cognitive status: Within functional limits for  tasks assessed     SENSATION: Light touch:  Proprioception:   MUSCLE LENGTH: Hamstrings: Right 70 deg; Left 70 deg Thomas test:   LUMBAR SPECIAL TESTS:  Straight leg raise test: Negative  FUNCTIONAL TESTS:  Single leg with trendelenburg bil and touching legs together  GAIT:  Comments: shortened stride length and mild trendelenburg bil  POSTURE: rounded shoulders, increased lumbar lordosis, increased thoracic kyphosis, and anterior pelvic tilt  PELVIC ALIGNMENT:  LUMBARAROM/PROM:  A/PROM A/PROM  eval  Flexion 50%  Extension   Right lateral flexion   Left lateral flexion   Right rotation   Left rotation    (Blank rows = not tested)  LOWER EXTREMITY ROM:  Passive ROM Right eval Left eval  Hip flexion 90% 90%  Hip extension    Hip abduction    Hip adduction    Hip internal rotation 60% 60%  Hip external rotation 75% 75%  Knee flexion    Knee extension    Ankle dorsiflexion    Ankle plantarflexion    Ankle inversion    Ankle eversion     (Blank rows = not tested)  LOWER EXTREMITY MMT:  MMT Right eval Left eval  Hip flexion    Hip extension    Hip abduction 4/5 4/5  Hip adduction 3/5 3/5  Hip internal rotation    Hip external rotation    Knee flexion    Knee extension    Ankle dorsiflexion    Ankle plantarflexion    Ankle inversion    Ankle eversion     PALPATION:   General  tenting at abdomen about 2 fingers, upper right quadrant tight and tender, lumbar paraspinals tight Rt/Lt                External Perineal Exam NA                             Internal Pelvic Floor NA  Patient confirms identification and approves PT to assess internal pelvic floor and treatment deferred  due to pain  PELVIC MMT:   MMT eval  Vaginal   Internal Anal Sphincter   External Anal Sphincter   Puborectalis   Diastasis Recti Above umbilicus   (Blank rows = not tested)        TONE: NA  PROLAPSE: NA  TODAY'S TREATMENT:                                                                                                                              DATE: 09/07/22   Manual:  Lumbar and gluteals bilateral STM  Trigger Point Dry-Needling  Treatment instructions: Expect mild to moderate muscle soreness. S/S of pneumothorax if dry needled over a lung field, and to seek immediate medical attention should they occur. Patient verbalized understanding of these instructions and education.  Patient Consent Given: Yes Education handout provided: Yes Muscles treated: lumbar multifidi and Rt  gluteals Electrical stimulation performed: No Parameters: N/A Treatment response/outcome: increased soft tissue length  Therex: Functional hip hinging and holding 10lb weight Walking forward holding red band for transversus abdominus activation  PATIENT EDUCATION:  Education details: abdominal massage Person educated: Patient Education method: Explanation, Demonstration, Tactile cues, Verbal cues, and Handouts Education comprehension: verbalized understanding and returned demonstration  HOME EXERCISE PROGRAM: Access Code: [email or web address] URL: [email or web address] Date: 08/24/2022 Prepared by: Dwana Curd  Exercises - Supine Figure 4 Piriformis Stretch  - 1 x daily - 7 x weekly - 1 sets - 3 reps - 30 sec hold - Supine Piriformis Stretch with Leg Straight  - 1 x daily - 7 x weekly - 1 sets - 3 reps - 30 sec hold - Prone Quadriceps Stretch with Strap  - 1 x daily - 7 x weekly - 1 sets - 3 reps - 30sec hold - Supine Butterfly Groin Stretch  - 1 x daily - 7 x weekly - 1 sets - 3 reps - 30 sec hold - Supine Hamstring Stretch with Strap  - 1 x daily - 7 x weekly - 1 sets - 3 reps -  30 sec hold - Standing Hamstring Stretch with Step  - 1 x daily - 7 x weekly - 1 sets - 3 reps - 30 sec hold - Modified Cat Cow on Counter  - 1 x daily - 7 x weekly - 3 sets - 10 reps - Standing Hip Circles  - 1 x daily - 7 x weekly - 3 sets - 10 reps  Patient Education - Trigger Point Dry Needling  ASSESSMENT:  CLINICAL IMPRESSION: Pt has been feeling better with decreased pain and improved bowel movements.  Pt continues to benefit from soft tissue release and able to add functional core exercises with lifting and walking.  Pt has not had leakage and is expected to continue to improve with skilled PT.  Pt needed cues to keep back flat and hinge at hips during exercises.  Pt still reports some pain and is working towards meeting all functional goals.  Pt will benefit from skilled PT to address impairments to achieve functional goals  OBJECTIVE IMPAIRMENTS: decreased coordination, decreased endurance, decreased ROM, decreased strength, increased muscle spasms, impaired flexibility, impaired tone, postural dysfunction, and pain.   ACTIVITY LIMITATIONS: sleeping, continence, and toileting  PARTICIPATION LIMITATIONS: interpersonal relationship and community activity  PERSONAL FACTORS: 3+ comorbidities: anal fissure, hemorrhoid, CKD, chronic constipation, DM, osteopenia, hysterectomy partial,back surgery x3, appendectomy, cholecystectomy, GERD, HTN  are also affecting patient's functional outcome.   REHAB POTENTIAL: Excellent  CLINICAL DECISION MAKING: Evolving/moderate complexity  EVALUATION COMPLEXITY: Moderate   GOALS: Goals reviewed with patient? Yes  SHORT TERM GOALS: Target date: 07/12/22  Ind with toileting and skin care for bowel movements. Baseline: Goal status: MET  2.  Pt will be able to use a stool for improved bowel movement position. Baseline:  Goal status: MET  LONG TERM GOALS: Target date: 11/02/22 Updated 09/07/22  Pt will be independent with advanced HEP to  maintain improvements made throughout therapy  Baseline:  Goal status: IN PROGRESS  2.  Pt will report her BMs are more complete due to improved bowel habits and evacuation techniques.  Baseline: definitely notices more complete and not difficulty to get out Goal status: IN PROGRESS  3.  Pt will be able to functional actions such as wait 5 minutes on line to use the bathroom without leakage  Baseline: has not had leakage this week Goal status: IN  PROGRESS  4.  Pt will report 50% less low back pain due to improved coordination of core and pelvic floor muscles during functional activities. Baseline: before doing too much last week, I have been having less pain Goal status: IN PROGRESS    PLAN:  PT FREQUENCY: 1x/week  PT DURATION: 12 weeks  PLANNED INTERVENTIONS: Therapeutic exercises, Therapeutic activity, Neuromuscular re-education, Balance training, Gait training, Patient/Family education, Self Care, Joint mobilization, Aquatic Therapy, Dry Needling, Electrical stimulation, Cryotherapy, Moist heat, Taping, Biofeedback, Manual therapy, and Re-evaluation  PLAN FOR NEXT SESSION: f/u on dry needling lumbar and gluteals; dry needling #5;  breathing and core activation, functional strength progressions   Junious Silk, PT 09/07/2022, 3:58 PM   PHYSICAL THERAPY DISCHARGE SUMMARY  Visits from Start of Care: 5  Current functional level related to goals / functional outcomes: See above goals   Remaining deficits: See above   Education / Equipment: HEP   Patient agrees to discharge. Patient goals were partially met. Patient is being discharged due to not returning since the last visit.  Russella Dar, PT, DPT 10/04/22 11:07 AM

## 2022-09-13 ENCOUNTER — Telehealth: Payer: Self-pay | Admitting: Medical

## 2022-09-13 NOTE — Telephone Encounter (Signed)
I called and spoke with patient and advised her of the message below. Patient says she was seen by Dr. Wolfgang Phoenix on 09/03/2022. Patient says that Dr. Wolfgang Phoenix is at the same location, only the practice name has changed from [facility] to [facility]. Dr. Wolfgang Phoenix is a solo practice now. Patient says that Dr. Lucio Edward nurse told her that they spoke with Vincenza Hews and gave their recommendations. Patient called our office last week to check on this but was told Vincenza Hews was on vacation. Patient says she has not taken Lisinopril, Lipitor or metformin in over month. Do you want to keep the same recommendations you made in the message below?

## 2022-09-13 NOTE — Telephone Encounter (Signed)
Emma [name] called and states you spoke with her kidney Dr and you were supposed to be letting her know what diabetic medication she should be on. She is wondering if you ever made that decision?

## 2022-09-15 NOTE — Telephone Encounter (Signed)
Patient advised. She states she is still confused. She says her kidney doctor told her to follow up with Emma [name] regarding her blood pressure since he was the one who diagnosed her. Patient also says that she thought she was supposed to be taking Lipitor. She wasn't aware of being prescribed rosuvastatin (which is what is listed in her chart). Patient wants to know what she should be taking for her blood sugars since she shouldn't take the metformin.

## 2022-09-15 NOTE — Therapy (Deleted)
OUTPATIENT PHYSICAL THERAPY FEMALE PELVIC TREATMENT   Patient Name: Emma [name] MRN: [id number] DOB:[birth year], 67 y.o., female Today's Date: 09/15/2022  END OF SESSION:        Past Medical History:  Diagnosis Date   Chronic constipation    CKD (chronic kidney disease), stage III (HCC)    Cluster headache syndrome 04/27/2013   Colon polyp 06/11/2011   Most recent 04/23/08, no polyp--mild diverticulosis.  Repeat 10 yrs.   Depression with anxiety 02/22/2008   Qualifier: Diagnosis of  By: Creta Levin CMA (AAMA), Robin     Diabetes mellitus without complication (HCC)    Diverticulosis    GERD (gastroesophageal reflux disease) 06/11/2011   Hypertension    Recently diagnosed   Migraine syndrome    Osteopenia 11/2016   bone density. plan repeat in 11/2018   Overweight(278.02) 06/11/2011   PUD (peptic ulcer disease) 04/23/2008   Multiple antrum ulcers; h pylori neg   Rheumatoid arthritis(714.0)    Past Surgical History:  Procedure Laterality Date   ABDOMINAL HYSTERECTOMY     APPENDECTOMY     BACK SURGERY     3 times   CARDIOVASCULAR STRESS TEST  06/22/2011   Normal; EF 65%   CHOLECYSTECTOMY     COLONOSCOPY  04/23/2008   mild diverticulosis, internal hemorrhoids; Dr. Melvia Heaps   ENDOVENOUS ABLATION SAPHENOUS VEIN W/ LASER Left 06/07/2019   endovenous laser ablation left greater saphenous vein and stab phlebectomy < 10 incisions left leg by Cari Caraway MD   ENDOVENOUS ABLATION SAPHENOUS VEIN W/ LASER Right 12/27/2019   eyelid tightening Bilateral    09/2021   LIVER BIOPSY  03/2007   Chronic active hepatitis w/fibrosis--related to hx of methotrexate treatment.   oral implants  2022   SKIN BIOPSY  08/16/2017   superficial basal cell carcinoma left inferior medial anterior tibia    Patient Active Problem List   Diagnosis Date Noted   Hyperlipidemia associated with type 2 diabetes mellitus (HCC) 07/28/2022   Pain in both lower extremities 08/03/2021   Leg swelling  08/03/2021   Bilateral lower extremity edema 03/27/2021   Need for pneumococcal vaccination 02/19/2021   Screening for heart disease 02/19/2021   LVH (left ventricular hypertrophy) 02/19/2021   Chronic insomnia 02/19/2021   Emotional disorder (HCC) 01/29/2021   Meibomianitis of left eye 06/23/2020   Allergic conjunctivitis 06/23/2020   Allergic rhinitis due to pollen 06/23/2020   Rash 06/23/2020   Stage 3b chronic kidney disease (HCC) 01/28/2020   Varicose veins of both lower extremities 07/10/2019   Anemia 09/27/2018   Decreased pedal pulses 09/27/2018   Chronic migraine 08/22/2018   Medicare annual wellness visit, subsequent 01/23/2018   Osteopenia 12/16/2016   Vaccine counseling 12/16/2016   Vitamin D deficiency 12/16/2016   Microalbuminuria 05/19/2016   Renal insufficiency 05/19/2016   Post-menopausal 05/19/2016   Estrogen deficiency 05/19/2016   Skin lesion 05/19/2016   Polyarthralgia 12/22/2015   Need for prophylactic vaccination against Streptococcus pneumoniae (pneumococcus) 12/22/2015   Need for prophylactic vaccination and inoculation against influenza 12/22/2015   Essential hypertension 03/27/2015   Rheumatoid arthritis (HCC) 03/27/2015   High risk medication use 03/27/2015   Insomnia 03/27/2015   Chronic constipation 03/27/2015   Edema 03/27/2015   History of migraine headaches 03/27/2015   Obesity 03/27/2015   Chronic cluster headache, not intractable 03/27/2015   Chronic venous insufficiency 03/27/2015   Diabetes mellitus with complication (HCC) 03/27/2015   Gastroesophageal reflux disease without esophagitis 03/27/2015   IBS (irritable bowel syndrome) 03/27/2015  Chest pain of uncertain etiology 06/11/2011   Colon polyp 06/11/2011    PCP: Jac Canavan, PA-C   REFERRING PROVIDER: Benancio Deeds, MD   REFERRING DIAG: fecal incontinence, chronic constipation  THERAPY DIAG:  No diagnosis found.  Rationale for Evaluation and Treatment:  Rehabilitation  ONSET DATE: two years is when it started getting worse  SUBJECTIVE:                                                                                                                                                                                           SUBJECTIVE STATEMENT: Pt had no leakage.  Overall, feeling less pain.  Bowel movements were a little slow this week and last one was Saturday.   Fluid intake: Yes: 8 glasses water plus teas etc    PAIN:  Are you having pain? No   PRECAUTIONS: None  WEIGHT BEARING RESTRICTIONS: No  FALLS:  Has patient fallen in last 6 months? No  LIVING ENVIRONMENT: Lives with: lives alone Lives in: House/apartment   OCCUPATION: no  PLOF: Independent  PATIENT GOALS: better control of bowel movement, less urgency, improve constipation  PERTINENT HISTORY:  CKD, chronic constipation, DM, osteopenia, hysterectomy partial,back surgery x3, appendectomy, cholecystectomy, GERD, HTN Sexual abuse: No  BOWEL MOVEMENT: Pain with bowel movement: Yes Type of bowel movement:Type (Bristol Stool Scale) small hard or liquit, Frequency every 2-3 days is the best it has been; , and Strain Yes trying not to Fully empty rectum: No Leakage: Yes: cannot make it with extreme urge sometimes Pads: No Fiber supplement: Yes: citrucel  URINATION: Pain with urination: No Fully empty bladder: No Stream: Strong Urgency: No Frequency: nocturia 1/night Leakage:  no Pads: No  INTERCOURSE: Not assessed  PREGNANCY: Vaginal deliveries 1   PROLAPSE: None   OBJECTIVE:   DIAGNOSTIC FINDINGS:    PATIENT SURVEYS:    PFIQ-7   COGNITION: Overall cognitive status: Within functional limits for tasks assessed     SENSATION: Light touch:  Proprioception:   MUSCLE LENGTH: Hamstrings: Right 70 deg; Left 70 deg Thomas test:   LUMBAR SPECIAL TESTS:  Straight leg raise test: Negative  FUNCTIONAL TESTS:  Single leg with trendelenburg bil  and touching legs together  GAIT:  Comments: shortened stride length and mild trendelenburg bil  POSTURE: rounded shoulders, increased lumbar lordosis, increased thoracic kyphosis, and anterior pelvic tilt  PELVIC ALIGNMENT:  LUMBARAROM/PROM:  A/PROM A/PROM  eval  Flexion 50%  Extension   Right lateral flexion   Left lateral flexion   Right rotation   Left rotation    (Blank rows = not tested)  LOWER EXTREMITY ROM:  Passive ROM Right eval Left eval  Hip flexion 90% 90%  Hip extension    Hip abduction    Hip adduction    Hip internal rotation 60% 60%  Hip external rotation 75% 75%  Knee flexion    Knee extension    Ankle dorsiflexion    Ankle plantarflexion    Ankle inversion    Ankle eversion     (Blank rows = not tested)  LOWER EXTREMITY MMT:  MMT Right eval Left eval  Hip flexion    Hip extension    Hip abduction 4/5 4/5  Hip adduction 3/5 3/5  Hip internal rotation    Hip external rotation    Knee flexion    Knee extension    Ankle dorsiflexion    Ankle plantarflexion    Ankle inversion    Ankle eversion     PALPATION:   General  tenting at abdomen about 2 fingers, upper right quadrant tight and tender, lumbar paraspinals tight Rt/Lt                External Perineal Exam NA                             Internal Pelvic Floor NA  Patient confirms identification and approves PT to assess internal pelvic floor and treatment deferred due to pain  PELVIC MMT:   MMT eval  Vaginal   Internal Anal Sphincter   External Anal Sphincter   Puborectalis   Diastasis Recti Above umbilicus   (Blank rows = not tested)        TONE: NA  PROLAPSE: NA  TODAY'S TREATMENT:                                                                                                                              DATE: 09/07/22   Manual:  Lumbar and gluteals bilateral STM  Trigger Point Dry-Needling  Treatment instructions: Expect mild to moderate muscle soreness. S/S  of pneumothorax if dry needled over a lung field, and to seek immediate medical attention should they occur. Patient verbalized understanding of these instructions and education.  Patient Consent Given: Yes Education handout provided: Yes Muscles treated: lumbar multifidi and Rt gluteals Electrical stimulation performed: No Parameters: N/A Treatment response/outcome: increased soft tissue length  Therex: Functional hip hinging and holding 10lb weight Walking forward holding red band for transversus abdominus activation  PATIENT EDUCATION:  Education details: abdominal massage Person educated: Patient Education method: Explanation, Demonstration, Tactile cues, Verbal cues, and Handouts Education comprehension: verbalized understanding and returned demonstration  HOME EXERCISE PROGRAM: Access Code: [email or web address] URL: [email or web address] Date: 08/24/2022 Prepared by: Dwana Curd  Exercises - Supine Figure 4 Piriformis Stretch  - 1 x daily - 7 x weekly - 1 sets - 3 reps - 30 sec hold - Supine Piriformis Stretch with Leg Straight  - 1 x daily - 7 x weekly - 1  sets - 3 reps - 30 sec hold - Prone Quadriceps Stretch with Strap  - 1 x daily - 7 x weekly - 1 sets - 3 reps - 30sec hold - Supine Butterfly Groin Stretch  - 1 x daily - 7 x weekly - 1 sets - 3 reps - 30 sec hold - Supine Hamstring Stretch with Strap  - 1 x daily - 7 x weekly - 1 sets - 3 reps - 30 sec hold - Standing Hamstring Stretch with Step  - 1 x daily - 7 x weekly - 1 sets - 3 reps - 30 sec hold - Modified Cat Cow on Counter  - 1 x daily - 7 x weekly - 3 sets - 10 reps - Standing Hip Circles  - 1 x daily - 7 x weekly - 3 sets - 10 reps  Patient Education - Trigger Point Dry Needling  ASSESSMENT:  CLINICAL IMPRESSION: Pt has been feeling better with decreased pain and improved bowel movements.  Pt continues to benefit from soft tissue release and able to add functional core exercises with lifting  and walking.  Pt has not had leakage and is expected to continue to improve with skilled PT.  Pt needed cues to keep back flat and hinge at hips during exercises.  Pt still reports some pain and is working towards meeting all functional goals.  Pt will benefit from skilled PT to address impairments to achieve functional goals  OBJECTIVE IMPAIRMENTS: decreased coordination, decreased endurance, decreased ROM, decreased strength, increased muscle spasms, impaired flexibility, impaired tone, postural dysfunction, and pain.   ACTIVITY LIMITATIONS: sleeping, continence, and toileting  PARTICIPATION LIMITATIONS: interpersonal relationship and community activity  PERSONAL FACTORS: 3+ comorbidities: anal fissure, hemorrhoid, CKD, chronic constipation, DM, osteopenia, hysterectomy partial,back surgery x3, appendectomy, cholecystectomy, GERD, HTN  are also affecting patient's functional outcome.   REHAB POTENTIAL: Excellent  CLINICAL DECISION MAKING: Evolving/moderate complexity  EVALUATION COMPLEXITY: Moderate   GOALS: Goals reviewed with patient? Yes  SHORT TERM GOALS: Target date: 07/12/22  Ind with toileting and skin care for bowel movements. Baseline: Goal status: MET  2.  Pt will be able to use a stool for improved bowel movement position. Baseline:  Goal status: MET  LONG TERM GOALS: Target date: 11/02/22 Updated 09/07/22  Pt will be independent with advanced HEP to maintain improvements made throughout therapy  Baseline:  Goal status: IN PROGRESS  2.  Pt will report her BMs are more complete due to improved bowel habits and evacuation techniques.  Baseline: definitely notices more complete and not difficulty to get out Goal status: IN PROGRESS  3.  Pt will be able to functional actions such as wait 5 minutes on line to use the bathroom without leakage  Baseline: has not had leakage this week Goal status: IN PROGRESS  4.  Pt will report 50% less low back pain due to improved  coordination of core and pelvic floor muscles during functional activities. Baseline: before doing too much last week, I have been having less pain Goal status: IN PROGRESS    PLAN:  PT FREQUENCY: 1x/week  PT DURATION: 12 weeks  PLANNED INTERVENTIONS: Therapeutic exercises, Therapeutic activity, Neuromuscular re-education, Balance training, Gait training, Patient/Family education, Self Care, Joint mobilization, Aquatic Therapy, Dry Needling, Electrical stimulation, Cryotherapy, Moist heat, Taping, Biofeedback, Manual therapy, and Re-evaluation  PLAN FOR NEXT SESSION: f/u on dry needling lumbar and gluteals; dry needling #5;  breathing and core activation, functional strength progressions   Junious Silk, PT 09/15/2022,  6:27 PM

## 2022-09-16 ENCOUNTER — Ambulatory Visit: Payer: Medicare Other | Admitting: Physical Therapy

## 2022-09-16 NOTE — Telephone Encounter (Signed)
Tried calling patient. No answer.

## 2022-09-16 NOTE — Telephone Encounter (Signed)
I called patient. Unfortunately, she didn't have time to talk with me at the time of my call. She says she was on her way out the door. I asked patient to call our office back once she returned home and was settled. Patient agreed to call back later when she had more time to talk.

## 2022-09-17 ENCOUNTER — Other Ambulatory Visit: Payer: Self-pay | Admitting: Medical

## 2022-09-17 ENCOUNTER — Encounter: Payer: Self-pay | Admitting: Internal Medicine

## 2022-09-17 NOTE — Telephone Encounter (Signed)
I have called and left message for pt and also sent her a mychart to look and reply as well.

## 2022-09-21 ENCOUNTER — Other Ambulatory Visit: Payer: Self-pay | Admitting: Medical

## 2022-09-21 ENCOUNTER — Other Ambulatory Visit: Payer: Self-pay | Admitting: Gastroenterology

## 2022-09-21 NOTE — Telephone Encounter (Signed)
Per previous telephone call on 09/13/22 shane wanted her to stop metformin due to kidney function

## 2022-09-21 NOTE — Telephone Encounter (Signed)
Left message for pt to call me back 

## 2022-09-22 ENCOUNTER — Ambulatory Visit: Payer: Medicare Other | Attending: Gastroenterology | Admitting: Physical Therapy

## 2022-09-22 ENCOUNTER — Other Ambulatory Visit: Payer: Self-pay | Admitting: Internal Medicine

## 2022-09-22 DIAGNOSIS — R293 Abnormal posture: Secondary | ICD-10-CM | POA: Insufficient documentation

## 2022-09-22 DIAGNOSIS — M5459 Other low back pain: Secondary | ICD-10-CM | POA: Insufficient documentation

## 2022-09-22 DIAGNOSIS — M62838 Other muscle spasm: Secondary | ICD-10-CM | POA: Insufficient documentation

## 2022-09-22 NOTE — Therapy (Deleted)
OUTPATIENT PHYSICAL THERAPY FEMALE PELVIC TREATMENT   Patient Name: Emma [name] MRN: [id number] DOB:01/17/1956, 67 y.o., female Today's Date: 09/22/2022  END OF SESSION:        Past Medical History:  Diagnosis Date   Chronic constipation    CKD (chronic kidney disease), stage III (HCC)    Cluster headache syndrome 04/27/2013   Colon polyp 06/11/2011   Most recent 04/23/08, no polyp--mild diverticulosis.  Repeat 10 yrs.   Depression with anxiety 02/22/2008   Qualifier: Diagnosis of  By: Creta Levin CMA (AAMA), Robin     Diabetes mellitus without complication (HCC)    Diverticulosis    GERD (gastroesophageal reflux disease) 06/11/2011   Hypertension    Recently diagnosed   Migraine syndrome    Osteopenia 11/2016   bone density. plan repeat in 11/2018   Overweight(278.02) 06/11/2011   PUD (peptic ulcer disease) 04/23/2008   Multiple antrum ulcers; h pylori neg   Rheumatoid arthritis(714.0)    Past Surgical History:  Procedure Laterality Date   ABDOMINAL HYSTERECTOMY     APPENDECTOMY     BACK SURGERY     3 times   CARDIOVASCULAR STRESS TEST  06/22/2011   Normal; EF 65%   CHOLECYSTECTOMY     COLONOSCOPY  04/23/2008   mild diverticulosis, internal hemorrhoids; Dr. Melvia Heaps   ENDOVENOUS ABLATION SAPHENOUS VEIN W/ LASER Left 06/07/2019   endovenous laser ablation left greater saphenous vein and stab phlebectomy < 10 incisions left leg by Cari Caraway MD   ENDOVENOUS ABLATION SAPHENOUS VEIN W/ LASER Right 12/27/2019   eyelid tightening Bilateral    09/2021   LIVER BIOPSY  03/2007   Chronic active hepatitis w/fibrosis--related to hx of methotrexate treatment.   oral implants  2022   SKIN BIOPSY  08/16/2017   superficial basal cell carcinoma left inferior medial anterior tibia    Patient Active Problem List   Diagnosis Date Noted   Hyperlipidemia associated with type 2 diabetes mellitus (HCC) 07/28/2022   Pain in both lower extremities 08/03/2021   Leg swelling  08/03/2021   Bilateral lower extremity edema 03/27/2021   Need for pneumococcal vaccination 02/19/2021   Screening for heart disease 02/19/2021   LVH (left ventricular hypertrophy) 02/19/2021   Chronic insomnia 02/19/2021   Emotional disorder (HCC) 01/29/2021   Meibomianitis of left eye 06/23/2020   Allergic conjunctivitis 06/23/2020   Allergic rhinitis due to pollen 06/23/2020   Rash 06/23/2020   Stage 3b chronic kidney disease (HCC) 01/28/2020   Varicose veins of both lower extremities 07/10/2019   Anemia 09/27/2018   Decreased pedal pulses 09/27/2018   Chronic migraine 08/22/2018   Medicare annual wellness visit, subsequent 01/23/2018   Osteopenia 12/16/2016   Vaccine counseling 12/16/2016   Vitamin D deficiency 12/16/2016   Microalbuminuria 05/19/2016   Renal insufficiency 05/19/2016   Post-menopausal 05/19/2016   Estrogen deficiency 05/19/2016   Skin lesion 05/19/2016   Polyarthralgia 12/22/2015   Need for prophylactic vaccination against Streptococcus pneumoniae (pneumococcus) 12/22/2015   Need for prophylactic vaccination and inoculation against influenza 12/22/2015   Essential hypertension 03/27/2015   Rheumatoid arthritis (HCC) 03/27/2015   High risk medication use 03/27/2015   Insomnia 03/27/2015   Chronic constipation 03/27/2015   Edema 03/27/2015   History of migraine headaches 03/27/2015   Obesity 03/27/2015   Chronic cluster headache, not intractable 03/27/2015   Chronic venous insufficiency 03/27/2015   Diabetes mellitus with complication (HCC) 03/27/2015   Gastroesophageal reflux disease without esophagitis 03/27/2015   IBS (irritable bowel syndrome) 03/27/2015  Chest pain of uncertain etiology 06/11/2011   Colon polyp 06/11/2011    PCP: Jac Canavan, PA-C   REFERRING PROVIDER: Benancio Deeds, MD   REFERRING DIAG: fecal incontinence, chronic constipation  THERAPY DIAG:  No diagnosis found.  Rationale for Evaluation and Treatment:  Rehabilitation  ONSET DATE: two years is when it started getting worse  SUBJECTIVE:                                                                                                                                                                                           SUBJECTIVE STATEMENT: Pt had no leakage.  Overall, feeling less pain.  Bowel movements were a little slow this week and last one was Saturday.   Fluid intake: Yes: 8 glasses water plus teas etc    PAIN:  Are you having pain? No   PRECAUTIONS: None  WEIGHT BEARING RESTRICTIONS: No  FALLS:  Has patient fallen in last 6 months? No  LIVING ENVIRONMENT: Lives with: lives alone Lives in: House/apartment   OCCUPATION: no  PLOF: Independent  PATIENT GOALS: better control of bowel movement, less urgency, improve constipation  PERTINENT HISTORY:  CKD, chronic constipation, DM, osteopenia, hysterectomy partial,back surgery x3, appendectomy, cholecystectomy, GERD, HTN Sexual abuse: No  BOWEL MOVEMENT: Pain with bowel movement: Yes Type of bowel movement:Type (Bristol Stool Scale) small hard or liquit, Frequency every 2-3 days is the best it has been; , and Strain Yes trying not to Fully empty rectum: No Leakage: Yes: cannot make it with extreme urge sometimes Pads: No Fiber supplement: Yes: citrucel  URINATION: Pain with urination: No Fully empty bladder: No Stream: Strong Urgency: No Frequency: nocturia 1/night Leakage:  no Pads: No  INTERCOURSE: Not assessed  PREGNANCY: Vaginal deliveries 1   PROLAPSE: None   OBJECTIVE:   DIAGNOSTIC FINDINGS:    PATIENT SURVEYS:    PFIQ-7   COGNITION: Overall cognitive status: Within functional limits for tasks assessed     SENSATION: Light touch:  Proprioception:   MUSCLE LENGTH: Hamstrings: Right 70 deg; Left 70 deg Thomas test:   LUMBAR SPECIAL TESTS:  Straight leg raise test: Negative  FUNCTIONAL TESTS:  Single leg with trendelenburg bil  and touching legs together  GAIT:  Comments: shortened stride length and mild trendelenburg bil  POSTURE: rounded shoulders, increased lumbar lordosis, increased thoracic kyphosis, and anterior pelvic tilt  PELVIC ALIGNMENT:  LUMBARAROM/PROM:  A/PROM A/PROM  eval  Flexion 50%  Extension   Right lateral flexion   Left lateral flexion   Right rotation   Left rotation    (Blank rows = not tested)  LOWER EXTREMITY ROM:  Passive ROM Right eval Left eval  Hip flexion 90% 90%  Hip extension    Hip abduction    Hip adduction    Hip internal rotation 60% 60%  Hip external rotation 75% 75%  Knee flexion    Knee extension    Ankle dorsiflexion    Ankle plantarflexion    Ankle inversion    Ankle eversion     (Blank rows = not tested)  LOWER EXTREMITY MMT:  MMT Right eval Left eval  Hip flexion    Hip extension    Hip abduction 4/5 4/5  Hip adduction 3/5 3/5  Hip internal rotation    Hip external rotation    Knee flexion    Knee extension    Ankle dorsiflexion    Ankle plantarflexion    Ankle inversion    Ankle eversion     PALPATION:   General  tenting at abdomen about 2 fingers, upper right quadrant tight and tender, lumbar paraspinals tight Rt/Lt                External Perineal Exam NA                             Internal Pelvic Floor NA  Patient confirms identification and approves PT to assess internal pelvic floor and treatment deferred due to pain  PELVIC MMT:   MMT eval  Vaginal   Internal Anal Sphincter   External Anal Sphincter   Puborectalis   Diastasis Recti Above umbilicus   (Blank rows = not tested)        TONE: NA  PROLAPSE: NA  TODAY'S TREATMENT:                                                                                                                              DATE: 09/07/22   Manual:  Lumbar and gluteals bilateral STM  Trigger Point Dry-Needling  Treatment instructions: Expect mild to moderate muscle soreness. S/S  of pneumothorax if dry needled over a lung field, and to seek immediate medical attention should they occur. Patient verbalized understanding of these instructions and education.  Patient Consent Given: Yes Education handout provided: Yes Muscles treated: lumbar multifidi and Rt gluteals Electrical stimulation performed: No Parameters: N/A Treatment response/outcome: increased soft tissue length  Therex: Functional hip hinging and holding 10lb weight Walking forward holding red band for transversus abdominus activation  PATIENT EDUCATION:  Education details: abdominal massage Person educated: Patient Education method: Explanation, Demonstration, Tactile cues, Verbal cues, and Handouts Education comprehension: verbalized understanding and returned demonstration  HOME EXERCISE PROGRAM: Access Code: [email or web address] URL: [email or web address] Date: 08/24/2022 Prepared by: Dwana Curd  Exercises - Supine Figure 4 Piriformis Stretch  - 1 x daily - 7 x weekly - 1 sets - 3 reps - 30 sec hold - Supine Piriformis Stretch with Leg Straight  - 1 x daily - 7 x weekly - 1  sets - 3 reps - 30 sec hold - Prone Quadriceps Stretch with Strap  - 1 x daily - 7 x weekly - 1 sets - 3 reps - 30sec hold - Supine Butterfly Groin Stretch  - 1 x daily - 7 x weekly - 1 sets - 3 reps - 30 sec hold - Supine Hamstring Stretch with Strap  - 1 x daily - 7 x weekly - 1 sets - 3 reps - 30 sec hold - Standing Hamstring Stretch with Step  - 1 x daily - 7 x weekly - 1 sets - 3 reps - 30 sec hold - Modified Cat Cow on Counter  - 1 x daily - 7 x weekly - 3 sets - 10 reps - Standing Hip Circles  - 1 x daily - 7 x weekly - 3 sets - 10 reps  Patient Education - Trigger Point Dry Needling  ASSESSMENT:  CLINICAL IMPRESSION: Pt has been feeling better with decreased pain and improved bowel movements.  Pt continues to benefit from soft tissue release and able to add functional core exercises with lifting  and walking.  Pt has not had leakage and is expected to continue to improve with skilled PT.  Pt needed cues to keep back flat and hinge at hips during exercises.  Pt still reports some pain and is working towards meeting all functional goals.  Pt will benefit from skilled PT to address impairments to achieve functional goals  OBJECTIVE IMPAIRMENTS: decreased coordination, decreased endurance, decreased ROM, decreased strength, increased muscle spasms, impaired flexibility, impaired tone, postural dysfunction, and pain.   ACTIVITY LIMITATIONS: sleeping, continence, and toileting  PARTICIPATION LIMITATIONS: interpersonal relationship and community activity  PERSONAL FACTORS: 3+ comorbidities: anal fissure, hemorrhoid, CKD, chronic constipation, DM, osteopenia, hysterectomy partial,back surgery x3, appendectomy, cholecystectomy, GERD, HTN  are also affecting patient's functional outcome.   REHAB POTENTIAL: Excellent  CLINICAL DECISION MAKING: Evolving/moderate complexity  EVALUATION COMPLEXITY: Moderate   GOALS: Goals reviewed with patient? Yes  SHORT TERM GOALS: Target date: 07/12/22  Ind with toileting and skin care for bowel movements. Baseline: Goal status: MET  2.  Pt will be able to use a stool for improved bowel movement position. Baseline:  Goal status: MET  LONG TERM GOALS: Target date: 11/02/22 Updated 09/07/22  Pt will be independent with advanced HEP to maintain improvements made throughout therapy  Baseline:  Goal status: IN PROGRESS  2.  Pt will report her BMs are more complete due to improved bowel habits and evacuation techniques.  Baseline: definitely notices more complete and not difficulty to get out Goal status: IN PROGRESS  3.  Pt will be able to functional actions such as wait 5 minutes on line to use the bathroom without leakage  Baseline: has not had leakage this week Goal status: IN PROGRESS  4.  Pt will report 50% less low back pain due to improved  coordination of core and pelvic floor muscles during functional activities. Baseline: before doing too much last week, I have been having less pain Goal status: IN PROGRESS    PLAN:  PT FREQUENCY: 1x/week  PT DURATION: 12 weeks  PLANNED INTERVENTIONS: Therapeutic exercises, Therapeutic activity, Neuromuscular re-education, Balance training, Gait training, Patient/Family education, Self Care, Joint mobilization, Aquatic Therapy, Dry Needling, Electrical stimulation, Cryotherapy, Moist heat, Taping, Biofeedback, Manual therapy, and Re-evaluation  PLAN FOR NEXT SESSION: f/u on dry needling lumbar and gluteals; dry needling #5;  breathing and core activation, functional strength progressions   Junious Silk, PT 09/22/2022,  2:00 PM

## 2022-09-22 NOTE — Telephone Encounter (Signed)
Current medicine regimen: updated as of 09/22/2022  Buspirone 7.5 mg ONCE a day for anxiety  Calcitriol 0.25 mg 3 times per kidney doctor- TUES, Thurs, Saturday Calcium carbonate 600 milligrams twice daily for calcium supplementation Vitamin D 2000 units daily [name] 10 mg for kidney protection- TUES, Thur SaT Citrucel fiber powder daily Mirtazapine Remeron 15 mg,  1 tablet at bedtime for sleep Multivitamin with iron daily Omeprazole Prilosec 40 mg in the morning preferably 30 to 45 minutes before breakfast Rosuvastatin Crestor 20 mg daily for cholesterol- NOT at BEDTIME Bentyl dicyclomine 10 mg every 8 hours as needed [name] 10 mg 1 tablet for both blood sugar and kidney protection- M,W,F Verapamil SR to 40 mg 1 tablet daily for blood pressure Alprazolam Xanax 0.5 mg Every day   Additional to list: Chlorthalidone-  tab every other day M,W,F Gabapentin- 300mg  1 tablet as needed for pain Amitiza 24mg - as needed  Lyrica- As needed (hasn't taken and advised she got off this as it wasn't working when she had shingles in 2024) Zofran- 4mg  for migraines as needed.  NO LONGER TAKING- Lipitor, Lisinopril, metformin and Lasix or SOMA  She needs a refill on Omeprazole and Xanax- send to pharmacy

## 2022-09-26 ENCOUNTER — Other Ambulatory Visit: Payer: Self-pay | Admitting: Medical

## 2022-09-26 MED ORDER — OMEPRAZOLE 40 MG PO CPDR: 40.0000 mg | DELAYED_RELEASE_CAPSULE | Freq: Every day | ORAL | 1 refills | Status: DC

## 2022-09-26 MED ORDER — ALPRAZOLAM 0.5 MG PO TABS: ORAL_TABLET | ORAL | 1 refills | Status: DC

## 2022-09-28 ENCOUNTER — Ambulatory Visit: Payer: Medicare Other | Admitting: Physical Therapy

## 2022-09-28 NOTE — Therapy (Deleted)
OUTPATIENT PHYSICAL THERAPY FEMALE PELVIC TREATMENT   Patient Name: Emma [name] MRN: [id number] DOB:1955-12-08, 67 y.o., female Today's Date: 09/28/2022  END OF SESSION:        Past Medical History:  Diagnosis Date   Chronic constipation    CKD (chronic kidney disease), stage III (HCC)    Cluster headache syndrome 04/27/2013   Colon polyp 06/11/2011   Most recent 04/23/08, no polyp--mild diverticulosis.  Repeat 10 yrs.   Depression with anxiety 02/22/2008   Qualifier: Diagnosis of  By: Creta Levin CMA (AAMA), Robin     Diabetes mellitus without complication (HCC)    Diverticulosis    GERD (gastroesophageal reflux disease) 06/11/2011   Hypertension    Recently diagnosed   Migraine syndrome    Osteopenia 11/2016   bone density. plan repeat in 11/2018   Overweight(278.02) 06/11/2011   PUD (peptic ulcer disease) 04/23/2008   Multiple antrum ulcers; h pylori neg   Rheumatoid arthritis(714.0)    Past Surgical History:  Procedure Laterality Date   ABDOMINAL HYSTERECTOMY     APPENDECTOMY     BACK SURGERY     3 times   CARDIOVASCULAR STRESS TEST  06/22/2011   Normal; EF 65%   CHOLECYSTECTOMY     COLONOSCOPY  04/23/2008   mild diverticulosis, internal hemorrhoids; Dr. Melvia Heaps   ENDOVENOUS ABLATION SAPHENOUS VEIN W/ LASER Left 06/07/2019   endovenous laser ablation left greater saphenous vein and stab phlebectomy < 10 incisions left leg by Cari Caraway MD   ENDOVENOUS ABLATION SAPHENOUS VEIN W/ LASER Right 12/27/2019   eyelid tightening Bilateral    09/2021   LIVER BIOPSY  03/2007   Chronic active hepatitis w/fibrosis--related to hx of methotrexate treatment.   oral implants  2022   SKIN BIOPSY  08/16/2017   superficial basal cell carcinoma left inferior medial anterior tibia    Patient Active Problem List   Diagnosis Date Noted   Hyperlipidemia associated with type 2 diabetes mellitus (HCC) 07/28/2022   Pain in both lower extremities 08/03/2021   Leg swelling  08/03/2021   Bilateral lower extremity edema 03/27/2021   Need for pneumococcal vaccination 02/19/2021   Screening for heart disease 02/19/2021   LVH (left ventricular hypertrophy) 02/19/2021   Chronic insomnia 02/19/2021   Emotional disorder (HCC) 01/29/2021   Meibomianitis of left eye 06/23/2020   Allergic conjunctivitis 06/23/2020   Allergic rhinitis due to pollen 06/23/2020   Rash 06/23/2020   Stage 3b chronic kidney disease (HCC) 01/28/2020   Varicose veins of both lower extremities 07/10/2019   Anemia 09/27/2018   Decreased pedal pulses 09/27/2018   Chronic migraine 08/22/2018   Medicare annual wellness visit, subsequent 01/23/2018   Osteopenia 12/16/2016   Vaccine counseling 12/16/2016   Vitamin D deficiency 12/16/2016   Microalbuminuria 05/19/2016   Renal insufficiency 05/19/2016   Post-menopausal 05/19/2016   Estrogen deficiency 05/19/2016   Skin lesion 05/19/2016   Polyarthralgia 12/22/2015   Need for prophylactic vaccination against Streptococcus pneumoniae (pneumococcus) 12/22/2015   Need for prophylactic vaccination and inoculation against influenza 12/22/2015   Essential hypertension 03/27/2015   Rheumatoid arthritis (HCC) 03/27/2015   High risk medication use 03/27/2015   Insomnia 03/27/2015   Chronic constipation 03/27/2015   Edema 03/27/2015   History of migraine headaches 03/27/2015   Obesity 03/27/2015   Chronic cluster headache, not intractable 03/27/2015   Chronic venous insufficiency 03/27/2015   Diabetes mellitus with complication (HCC) 03/27/2015   Gastroesophageal reflux disease without esophagitis 03/27/2015   IBS (irritable bowel syndrome) 03/27/2015  Chest pain of uncertain etiology 06/11/2011   Colon polyp 06/11/2011    PCP: Jac Canavan, PA-C   REFERRING PROVIDER: Benancio Deeds, MD   REFERRING DIAG: fecal incontinence, chronic constipation  THERAPY DIAG:  No diagnosis found.  Rationale for Evaluation and Treatment:  Rehabilitation  ONSET DATE: two years is when it started getting worse  SUBJECTIVE:                                                                                                                                                                                           SUBJECTIVE STATEMENT: Pt had no leakage.  Overall, feeling less pain.  Bowel movements were a little slow this week and last one was Saturday.   Fluid intake: Yes: 8 glasses water plus teas etc    PAIN:  Are you having pain? No   PRECAUTIONS: None  WEIGHT BEARING RESTRICTIONS: No  FALLS:  Has patient fallen in last 6 months? No  LIVING ENVIRONMENT: Lives with: lives alone Lives in: House/apartment   OCCUPATION: no  PLOF: Independent  PATIENT GOALS: better control of bowel movement, less urgency, improve constipation  PERTINENT HISTORY:  CKD, chronic constipation, DM, osteopenia, hysterectomy partial,back surgery x3, appendectomy, cholecystectomy, GERD, HTN Sexual abuse: No  BOWEL MOVEMENT: Pain with bowel movement: Yes Type of bowel movement:Type (Bristol Stool Scale) small hard or liquit, Frequency every 2-3 days is the best it has been; , and Strain Yes trying not to Fully empty rectum: No Leakage: Yes: cannot make it with extreme urge sometimes Pads: No Fiber supplement: Yes: citrucel  URINATION: Pain with urination: No Fully empty bladder: No Stream: Strong Urgency: No Frequency: nocturia 1/night Leakage:  no Pads: No  INTERCOURSE: Not assessed  PREGNANCY: Vaginal deliveries 1   PROLAPSE: None   OBJECTIVE:   DIAGNOSTIC FINDINGS:    PATIENT SURVEYS:    PFIQ-7   COGNITION: Overall cognitive status: Within functional limits for tasks assessed     SENSATION: Light touch:  Proprioception:   MUSCLE LENGTH: Hamstrings: Right 70 deg; Left 70 deg Thomas test:   LUMBAR SPECIAL TESTS:  Straight leg raise test: Negative  FUNCTIONAL TESTS:  Single leg with trendelenburg bil  and touching legs together  GAIT:  Comments: shortened stride length and mild trendelenburg bil  POSTURE: rounded shoulders, increased lumbar lordosis, increased thoracic kyphosis, and anterior pelvic tilt  PELVIC ALIGNMENT:  LUMBARAROM/PROM:  A/PROM A/PROM  eval  Flexion 50%  Extension   Right lateral flexion   Left lateral flexion   Right rotation   Left rotation    (Blank rows = not tested)  LOWER EXTREMITY ROM:  Passive ROM Right eval Left eval  Hip flexion 90% 90%  Hip extension    Hip abduction    Hip adduction    Hip internal rotation 60% 60%  Hip external rotation 75% 75%  Knee flexion    Knee extension    Ankle dorsiflexion    Ankle plantarflexion    Ankle inversion    Ankle eversion     (Blank rows = not tested)  LOWER EXTREMITY MMT:  MMT Right eval Left eval  Hip flexion    Hip extension    Hip abduction 4/5 4/5  Hip adduction 3/5 3/5  Hip internal rotation    Hip external rotation    Knee flexion    Knee extension    Ankle dorsiflexion    Ankle plantarflexion    Ankle inversion    Ankle eversion     PALPATION:   General  tenting at abdomen about 2 fingers, upper right quadrant tight and tender, lumbar paraspinals tight Rt/Lt                External Perineal Exam NA                             Internal Pelvic Floor NA  Patient confirms identification and approves PT to assess internal pelvic floor and treatment deferred due to pain  PELVIC MMT:   MMT eval  Vaginal   Internal Anal Sphincter   External Anal Sphincter   Puborectalis   Diastasis Recti Above umbilicus   (Blank rows = not tested)        TONE: NA  PROLAPSE: NA  TODAY'S TREATMENT:                                                                                                                              DATE: 09/07/22   Manual:  Lumbar and gluteals bilateral STM  Trigger Point Dry-Needling  Treatment instructions: Expect mild to moderate muscle soreness. S/S  of pneumothorax if dry needled over a lung field, and to seek immediate medical attention should they occur. Patient verbalized understanding of these instructions and education.  Patient Consent Given: Yes Education handout provided: Yes Muscles treated: lumbar multifidi and Rt gluteals Electrical stimulation performed: No Parameters: N/A Treatment response/outcome: increased soft tissue length  Therex: Functional hip hinging and holding 10lb weight Walking forward holding red band for transversus abdominus activation  PATIENT EDUCATION:  Education details: abdominal massage Person educated: Patient Education method: Explanation, Demonstration, Tactile cues, Verbal cues, and Handouts Education comprehension: verbalized understanding and returned demonstration  HOME EXERCISE PROGRAM: Access Code: [email or web address] URL: [email or web address] Date: 08/24/2022 Prepared by: Dwana Curd  Exercises - Supine Figure 4 Piriformis Stretch  - 1 x daily - 7 x weekly - 1 sets - 3 reps - 30 sec hold - Supine Piriformis Stretch with Leg Straight  - 1 x daily - 7 x weekly - 1  sets - 3 reps - 30 sec hold - Prone Quadriceps Stretch with Strap  - 1 x daily - 7 x weekly - 1 sets - 3 reps - 30sec hold - Supine Butterfly Groin Stretch  - 1 x daily - 7 x weekly - 1 sets - 3 reps - 30 sec hold - Supine Hamstring Stretch with Strap  - 1 x daily - 7 x weekly - 1 sets - 3 reps - 30 sec hold - Standing Hamstring Stretch with Step  - 1 x daily - 7 x weekly - 1 sets - 3 reps - 30 sec hold - Modified Cat Cow on Counter  - 1 x daily - 7 x weekly - 3 sets - 10 reps - Standing Hip Circles  - 1 x daily - 7 x weekly - 3 sets - 10 reps  Patient Education - Trigger Point Dry Needling  ASSESSMENT:  CLINICAL IMPRESSION: Pt has been feeling better with decreased pain and improved bowel movements.  Pt continues to benefit from soft tissue release and able to add functional core exercises with lifting  and walking.  Pt has not had leakage and is expected to continue to improve with skilled PT.  Pt needed cues to keep back flat and hinge at hips during exercises.  Pt still reports some pain and is working towards meeting all functional goals.  Pt will benefit from skilled PT to address impairments to achieve functional goals  OBJECTIVE IMPAIRMENTS: decreased coordination, decreased endurance, decreased ROM, decreased strength, increased muscle spasms, impaired flexibility, impaired tone, postural dysfunction, and pain.   ACTIVITY LIMITATIONS: sleeping, continence, and toileting  PARTICIPATION LIMITATIONS: interpersonal relationship and community activity  PERSONAL FACTORS: 3+ comorbidities: anal fissure, hemorrhoid, CKD, chronic constipation, DM, osteopenia, hysterectomy partial,back surgery x3, appendectomy, cholecystectomy, GERD, HTN  are also affecting patient's functional outcome.   REHAB POTENTIAL: Excellent  CLINICAL DECISION MAKING: Evolving/moderate complexity  EVALUATION COMPLEXITY: Moderate   GOALS: Goals reviewed with patient? Yes  SHORT TERM GOALS: Target date: 07/12/22  Ind with toileting and skin care for bowel movements. Baseline: Goal status: MET  2.  Pt will be able to use a stool for improved bowel movement position. Baseline:  Goal status: MET  LONG TERM GOALS: Target date: 11/02/22 Updated 09/07/22  Pt will be independent with advanced HEP to maintain improvements made throughout therapy  Baseline:  Goal status: IN PROGRESS  2.  Pt will report her BMs are more complete due to improved bowel habits and evacuation techniques.  Baseline: definitely notices more complete and not difficulty to get out Goal status: IN PROGRESS  3.  Pt will be able to functional actions such as wait 5 minutes on line to use the bathroom without leakage  Baseline: has not had leakage this week Goal status: IN PROGRESS  4.  Pt will report 50% less low back pain due to improved  coordination of core and pelvic floor muscles during functional activities. Baseline: before doing too much last week, I have been having less pain Goal status: IN PROGRESS    PLAN:  PT FREQUENCY: 1x/week  PT DURATION: 12 weeks  PLANNED INTERVENTIONS: Therapeutic exercises, Therapeutic activity, Neuromuscular re-education, Balance training, Gait training, Patient/Family education, Self Care, Joint mobilization, Aquatic Therapy, Dry Needling, Electrical stimulation, Cryotherapy, Moist heat, Taping, Biofeedback, Manual therapy, and Re-evaluation  PLAN FOR NEXT SESSION: f/u on dry needling lumbar and gluteals; dry needling #5;  breathing and core activation, functional strength progressions   Junious Silk, PT 09/28/2022,  9:04 AM

## 2022-10-21 ENCOUNTER — Other Ambulatory Visit: Payer: Self-pay | Admitting: Medical

## 2022-10-21 ENCOUNTER — Other Ambulatory Visit: Payer: Self-pay | Admitting: Gastroenterology

## 2022-11-10 IMAGING — DX DG CHEST 1V PORT
1 series · 1 of 1 positions shown · non-contrast
Comparison: 10/31/2007

CLINICAL DATA: Chest pain

EXAM:
PORTABLE CHEST 1 VIEW

[chest ap grid]
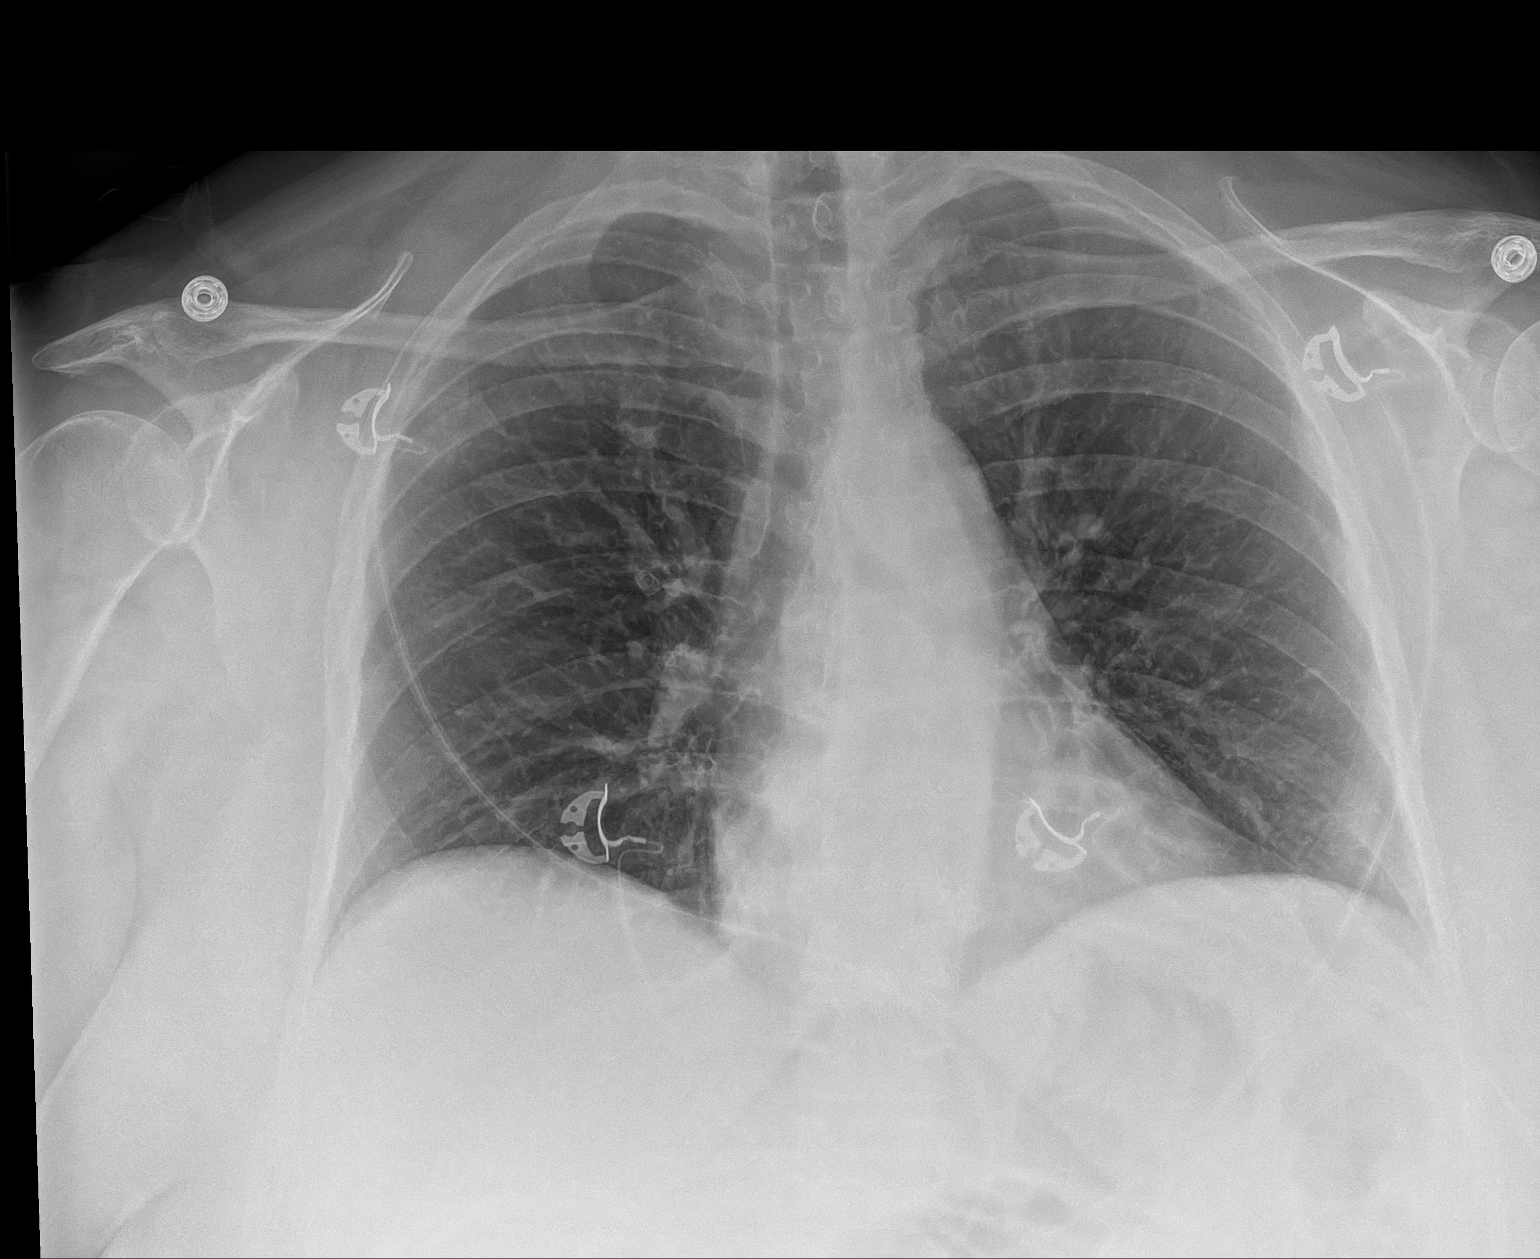

[1 of 1 positions shown; findings below may reference images not displayed]

FINDINGS: The heart size and mediastinal contours are within normal limits.
Both lungs are clear. The visualized skeletal structures are
unremarkable.
IMPRESSION: No active disease.

## 2022-11-10 IMAGING — DX DG TIBIA/FIBULA 2V*R*
4 series · 4 of 4 positions shown · non-contrast
Comparison: None.

CLINICAL DATA: 65-year-old female with acute pain since 9455 hours
yesterday. No known injury.

EXAM:
RIGHT TIBIA AND FIBULA - 2 VIEW

[tibia ap (1 of 2)]
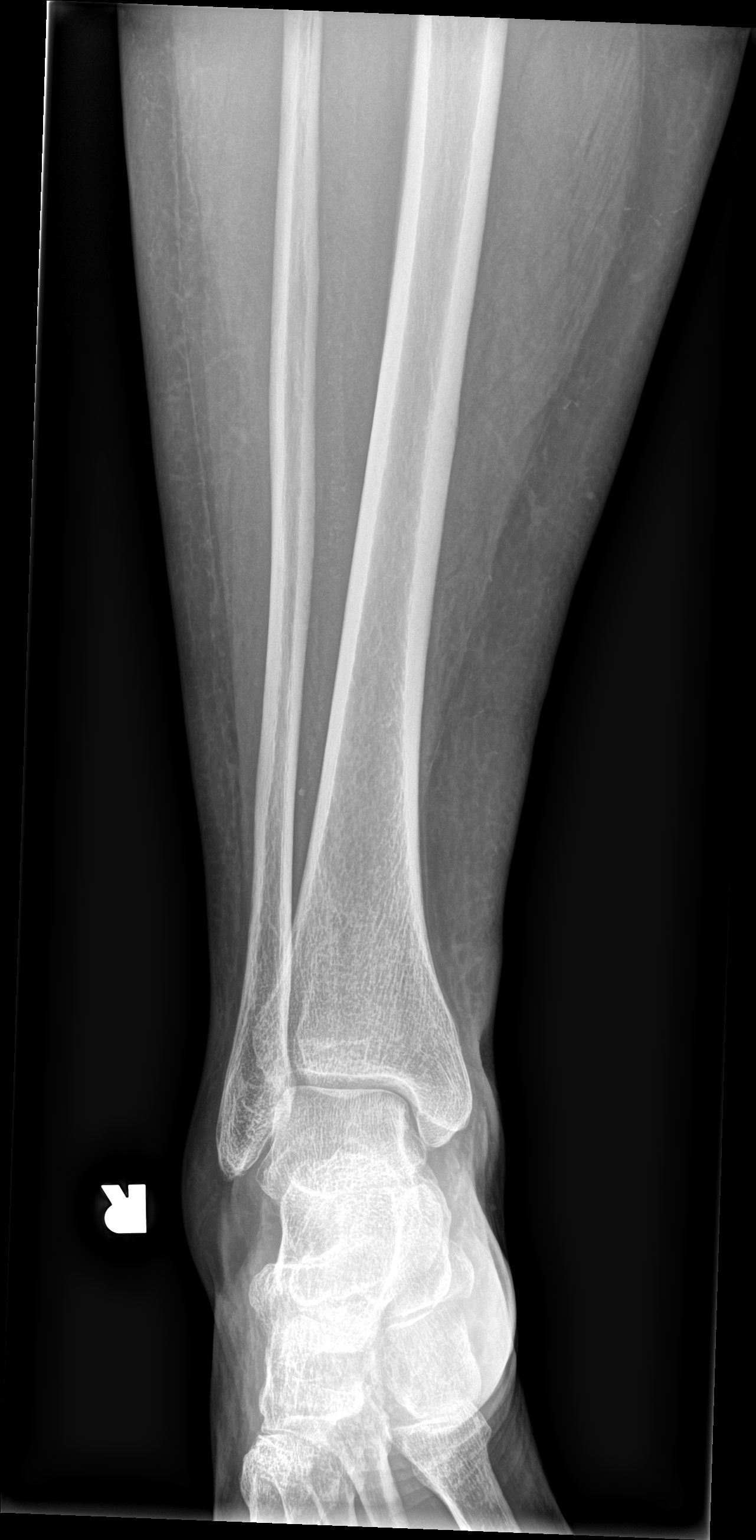

[tibia ap (2 of 2)]
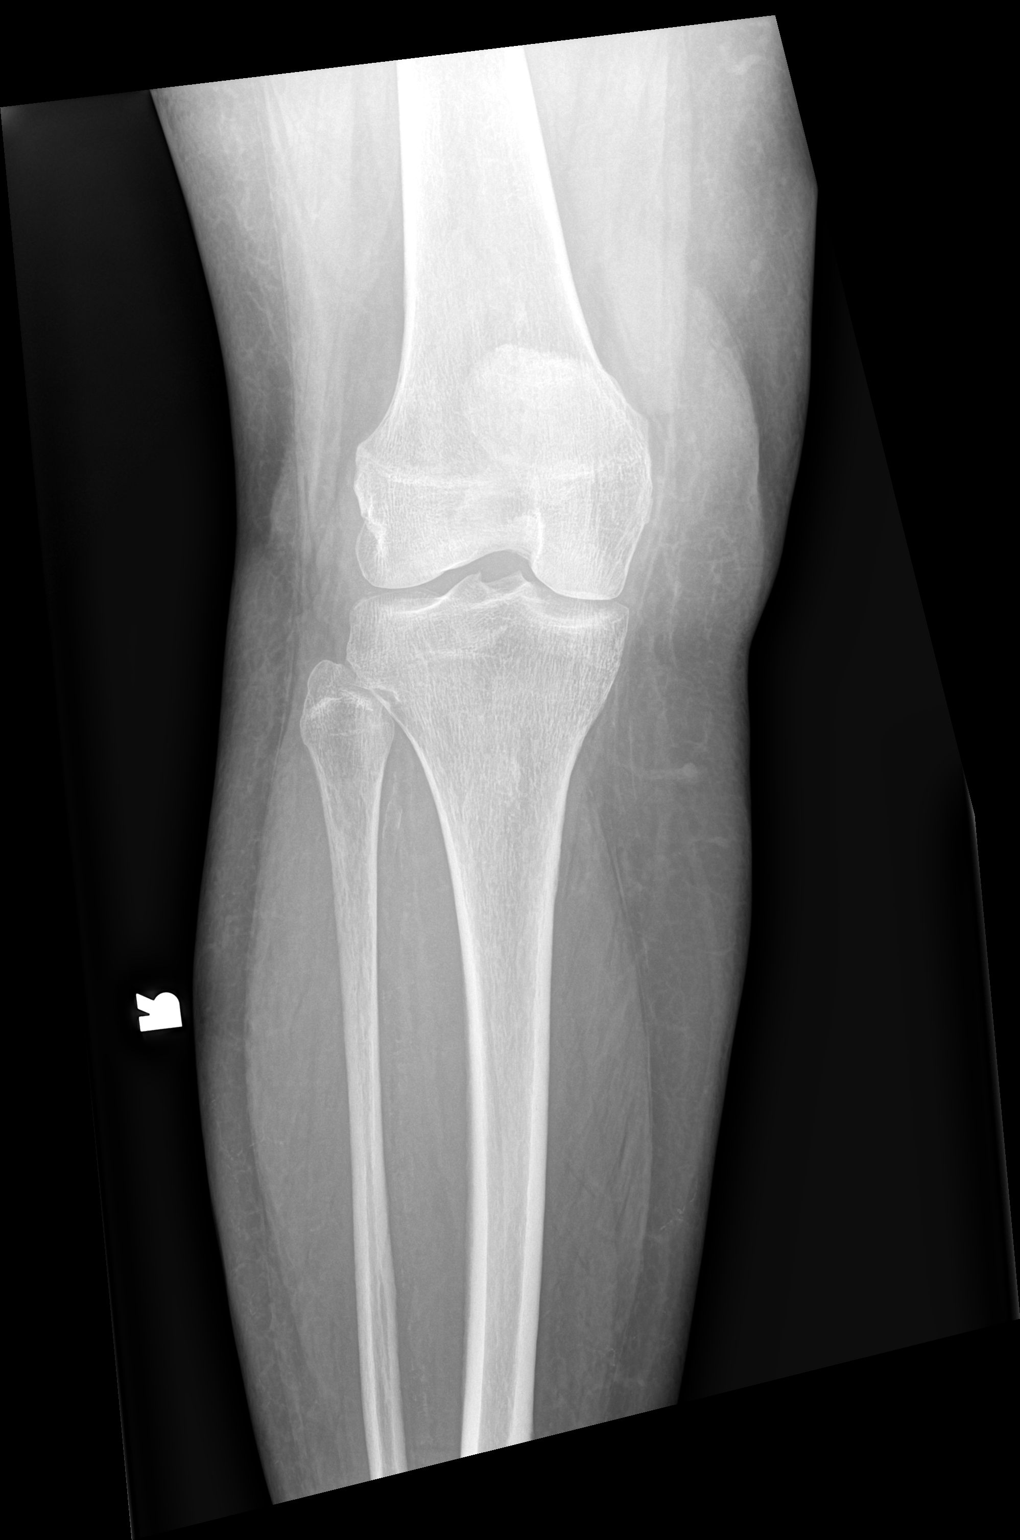

[tibia lat (1 of 2)]
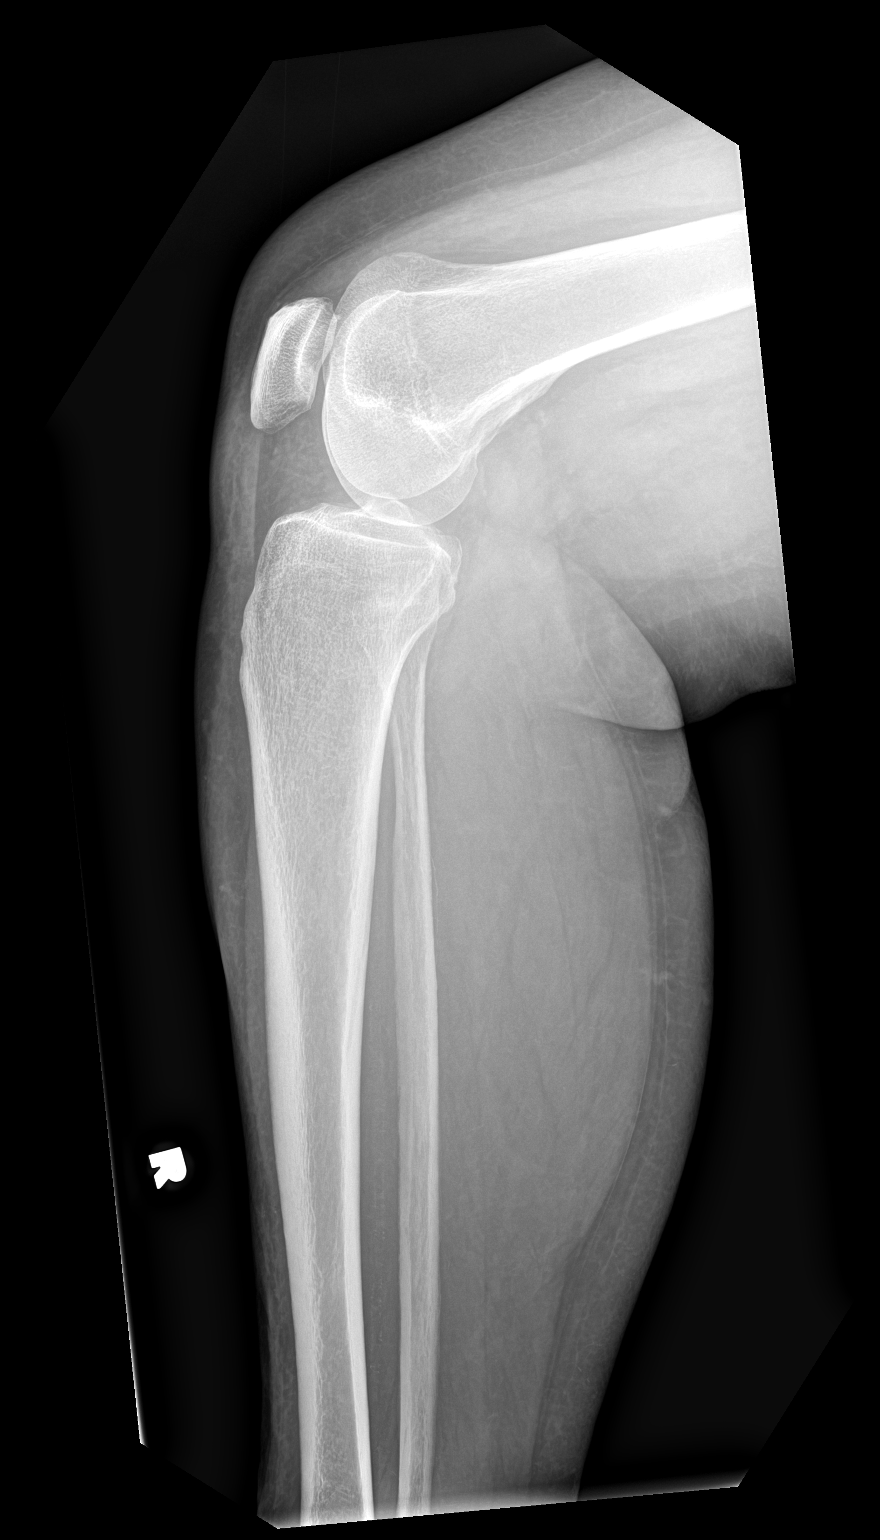

[tibia lat (2 of 2)]
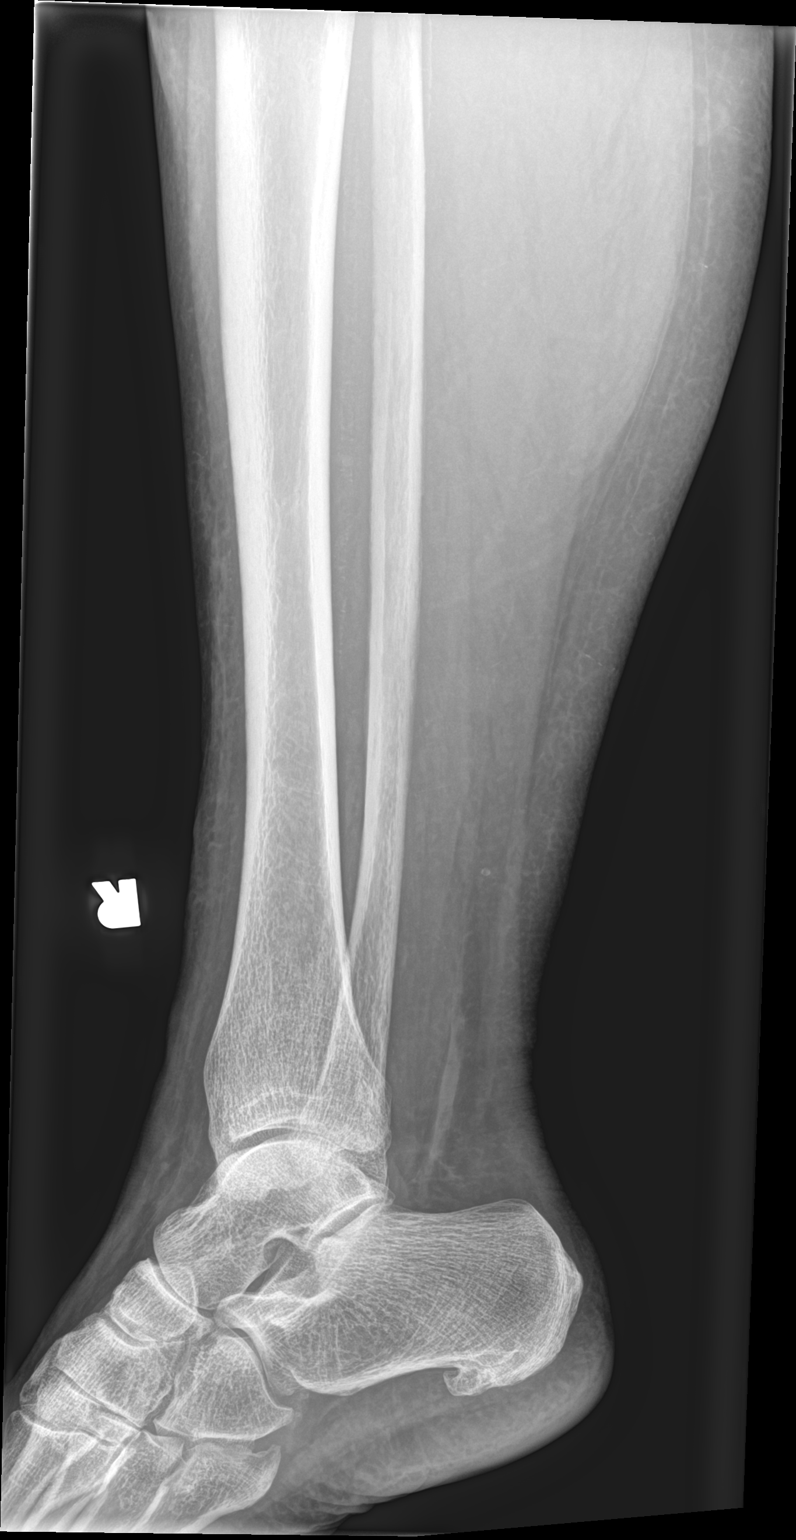

[4 of 4 positions shown; findings below may reference images not displayed]

FINDINGS: Bone mineralization is within normal limits for age. No evidence of
knee or ankle joint effusion. There is no evidence of fracture or
other focal bone lesions. Plantar degenerative spurring at the
calcaneus. Soft tissues are within normal limits.
IMPRESSION: No acute osseous abnormality identified.

## 2022-11-14 ENCOUNTER — Other Ambulatory Visit: Payer: Self-pay | Admitting: Medical

## 2022-11-19 LAB — LAB REPORT - SCANNED: EGFR: 29

## 2022-12-01 ENCOUNTER — Ambulatory Visit (INDEPENDENT_AMBULATORY_CARE_PROVIDER_SITE_OTHER): Payer: Medicare Other | Admitting: Medical

## 2022-12-01 VITALS — BP 110/72 | HR 58 | Wt 235.4 lb

## 2022-12-01 DIAGNOSIS — E785 Hyperlipidemia, unspecified: Secondary | ICD-10-CM

## 2022-12-01 DIAGNOSIS — E118 Type 2 diabetes mellitus with unspecified complications: Secondary | ICD-10-CM

## 2022-12-01 DIAGNOSIS — Z23 Encounter for immunization: Secondary | ICD-10-CM | POA: Diagnosis not present

## 2022-12-01 DIAGNOSIS — E1169 Type 2 diabetes mellitus with other specified complication: Secondary | ICD-10-CM

## 2022-12-01 LAB — POCT GLYCOSYLATED HEMOGLOBIN (HGB A1C): Hemoglobin A1C: 7.7 % — AB (ref 4.0–5.6)

## 2022-12-01 MED ORDER — SOLIQUA 100-33 UNT-MCG/ML ~~LOC~~ SOPN: 15.0000 [IU] | PEN_INJECTOR | Freq: Every day | SUBCUTANEOUS | 2 refills | Status: DC

## 2022-12-01 NOTE — Patient Instructions (Addendum)
Your hgba1c is 7.7%  We want to get your HgbA1C under 7%  I need you to check your sugars preferably every morning fasting and also check some readings before lunch or dinner on some days  The goal is less than 120 sugar readings  We also want to avoid any readings close to 70 or less, which would be hypoglycemia.  Hypoglycemia is dangerous, so lets avoid this  Begin Soliqua injection.  Begin 15 units in the morning about an hour before first meal.    If this week you are seeing blood sugars closer to 70 then I will need you to cut your [name] in half.  Otherwise continue [name] as usual if blood sugars are staying around 100 or higher fasting  Continue [name] which is also for sugar, heart and kidney  Drink at least 80-100 ounces of water daily  I am referring you to nutritionist   lets recheck in 1 month

## 2022-12-01 NOTE — Progress Notes (Signed)
Subjective:  Emma [name] is a 67 y.o. female who presents for Chief Complaint  Patient presents with   Diabetes    Diabetes sugars are running high. Kidneys are not empting out like they should, so she would like something for weight loss, sugars and help with kidney function     Here for recheck on diabetes.  She has been seeing her kidney doctor as her kidney marker has decreased.  GFR recently 29.  She is on [place].  She was taken off diuretic.  She wants to try either a GLP-1 medicine like Ozempic or Mounjaro but insurance will not cover Mounjaro.  Ozempic and Trulicity are close to $300 for a 62-month supply  She is upset that her kidney function has declined so much  Compliant with other medications  No other aggravating or relieving factors.    No other c/o.  Past Medical History:  Diagnosis Date   Chronic constipation    CKD (chronic kidney disease), stage III (HCC)    Cluster headache syndrome 04/27/2013   Colon polyp 06/11/2011   Most recent 04/23/08, no polyp--mild diverticulosis.  Repeat 10 yrs.   Depression with anxiety 02/22/2008   Qualifier: Diagnosis of  By: Creta Levin CMA (AAMA), Robin     Diabetes mellitus without complication (HCC)    Diverticulosis    GERD (gastroesophageal reflux disease) 06/11/2011   Hypertension    Recently diagnosed   Migraine syndrome    Osteopenia 11/2016   bone density. plan repeat in 11/2018   Overweight(278.02) 06/11/2011   PUD (peptic ulcer disease) 04/23/2008   Multiple antrum ulcers; h pylori neg   Rheumatoid arthritis(714.0)    Current Outpatient Medications on File Prior to Visit  Medication Sig Dispense Refill   ALPRAZolam (XANAX) 0.5 MG tablet TAKE 1 TABLET BY MOUTH EVERYDAY AT BEDTIME 30 tablet 1   AMBULATORY NON FORMULARY MEDICATION Diltiazem gel with 2% lidocaine  Apply a pea sized amount into your rectum three times daily until healed Dispense 30 GM zero refill 30 g 0   busPIRone (BUSPAR) 7.5 MG  tablet TAKE 1 TABLET BY MOUTH TWICE A DAY 180 tablet 0   calcitRIOL (ROCALTROL) 0.25 MCG capsule Take 0.25 mcg by mouth 3 (three) times a week.     calcium carbonate ([employer] CALCIUM) 1500 (600 Ca) MG TABS tablet TAKE 1 TABLET (600 MG TOTAL) BY MOUTH 2 (TWO) TIMES DAILY WITH A MEAL. 150 tablet 1   carisoprodol (SOMA) 350 MG tablet   0   Cholecalciferol 50 MCG (2000 UT) CAPS 1 capsule Orally once weekly     gabapentin (NEURONTIN) 300 MG capsule Take 300 mg by mouth as needed.     [name] 10 MG TABS tablet Take 10 mg by mouth every morning.     [name] 10 MG TABS Take 1 tablet by mouth daily.     methylcellulose (CITRUCEL) oral powder Take 1 packet by mouth daily.     mirtazapine (REMERON) 15 MG tablet TAKE 1/2 OR 1 TABLET BY MOUTH AT BEDTIME 90 tablet 0   Multiple Vitamins-Iron (MULTIVITAMINS WITH IRON) TABS tablet Take 1 tablet by mouth daily. 90 tablet 0   omeprazole (PRILOSEC) 40 MG capsule Take 1 capsule (40 mg total) by mouth daily. 90 capsule 1   Rectal Protectant-Emollient (CALMOL-4) 76-10 % SUPP Use as directed once to twice daily 6 suppository 0   rosuvastatin (CRESTOR) 20 MG tablet TAKE 1 TABLET BY MOUTH EVERY DAY 90 tablet 1   verapamil (CALAN-SR) 240 MG  CR tablet TAKE 1 TABLET BY MOUTH EVERYDAY AT BEDTIME 90 tablet 1   acetaminophen (TYLENOL) 500 MG tablet Take 1,500 mg by mouth once as needed for mild pain or moderate pain.     Blood Glucose Monitoring Suppl DEVI 1 each by Does not apply route daily. Patient to test 1-2 times daily. DX E11.9 1 each 0   Glucose Blood (BLOOD GLUCOSE TEST STRIPS) STRP Inject 1 each into the skin daily. Patient to test 1-2 times daily DX E11.9 100 each 2   No current facility-administered medications on file prior to visit.      The following portions of the patient's history were reviewed and updated as appropriate: allergies, current medications, past family history, past medical history, past social history, past surgical history and problem  list.  ROS Otherwise as in subjective above  Objective: BP 110/72   Pulse (!) 58   Wt 235 lb 6.4 oz (106.8 kg)   BMI 36.87 kg/m   General appearance: alert, no distress, well developed, well nourished     Assessment: Encounter Diagnoses  Name Primary?   COVID-19 vaccine administered Yes   Needs flu shot    Diabetes mellitus with complication (HCC)    Hyperlipidemia associated with type 2 diabetes mellitus (HCC)      Plan: We discussed her current concerns, blood sugars.  Hemoglobin A1c 7.7% today.  Gave demonstration on how to use [place] pen.  Referral to nutrition today  Continue glucose monitoring but needs to be monitoring regularly not just once a week.  Counseled on flu and COVID vaccines today.  Patient Instructions  Your hgba1c is 7.7%  We want to get your HgbA1C under 7%  I need you to check your sugars preferably every morning fasting and also check some readings before lunch or dinner on some days  The goal is less than 120 sugar readings  We also want to avoid any readings close to 70 or less, which would be hypoglycemia.  Hypoglycemia is dangerous, so lets avoid this  Begin Soliqua injection.  Begin 15 units in the morning about an hour before first meal.    If this week you are seeing blood sugars closer to 70 then I will need you to cut your [name] in half.  Otherwise continue [name] as usual if blood sugars are staying around 100 or higher fasting  Continue [name] which is also for sugar, heart and kidney  Drink at least 80-100 ounces of water daily  I am referring you to nutritionist   lets recheck in 1 month           Galina was seen today for diabetes.  Diagnoses and all orders for this visit:  COVID-19 vaccine administered -     Pfizer Comirnaty Covid -19 Vaccine 68yrs and older  Needs flu shot -     Flu Vaccine Trivalent High Dose (Fluad)  Diabetes mellitus with complication (HCC) -     HgB A1c -      Ambulatory referral to diabetic education  Hyperlipidemia associated with type 2 diabetes mellitus (HCC) -     HgB A1c  Other orders -     Insulin Glargine-Lixisenatide (SOLIQUA) 100-33 UNT-MCG/ML SOPN; Inject 15 Units into the skin daily.    Follow up: 105mo

## 2022-12-13 ENCOUNTER — Other Ambulatory Visit: Payer: Self-pay | Admitting: Medical

## 2022-12-21 ENCOUNTER — Other Ambulatory Visit: Payer: Self-pay | Admitting: Medical

## 2022-12-21 NOTE — Telephone Encounter (Signed)
Is this okay to refill? 

## 2022-12-23 ENCOUNTER — Telehealth: Payer: Self-pay | Admitting: Medical

## 2022-12-23 NOTE — Telephone Encounter (Signed)
Pt called and is having a flare of her rheumatoid arthritis  She wants to know if you can call in steroid

## 2022-12-24 ENCOUNTER — Other Ambulatory Visit: Payer: Self-pay | Admitting: Medical

## 2022-12-24 MED ORDER — PREDNISONE 10 MG PO TABS: ORAL_TABLET | ORAL | 0 refills | Status: DC

## 2023-01-19 ENCOUNTER — Ambulatory Visit: Payer: BLUE CROSS/BLUE SHIELD | Admitting: Nutrition

## 2023-02-12 ENCOUNTER — Other Ambulatory Visit: Payer: Self-pay | Admitting: Medical

## 2023-02-12 ENCOUNTER — Other Ambulatory Visit: Payer: Self-pay | Admitting: Gastroenterology

## 2023-02-14 ENCOUNTER — Other Ambulatory Visit: Payer: Self-pay | Admitting: Medical

## 2023-02-14 NOTE — Telephone Encounter (Signed)
Yes can refill it she takes it for dyspepsia. Thanks

## 2023-02-25 ENCOUNTER — Telehealth: Payer: Self-pay

## 2023-02-25 NOTE — Progress Notes (Signed)
   02/25/2023  Patient ID: Emma [name], female   DOB: 26-Jan-1956, 67 y.o.   MRN: [id number]  Reached out to patient to see if she has received the renewal paperwork to continue to receive PAP for 2025 on [name] and [name].  Patient confirms she has received the applications to renew. Instructed patient to return those to as at her earliest convenience for processing and to let us know if any assistance is needed with filling out the paperwork.  Patient expressed understanding.  Sherrill Raring, PharmD Clinical Pharmacist [phone]

## 2023-03-20 ENCOUNTER — Other Ambulatory Visit: Payer: Self-pay | Admitting: Medical

## 2023-03-24 ENCOUNTER — Ambulatory Visit: Payer: BLUE CROSS/BLUE SHIELD | Admitting: Nutrition

## 2023-03-30 ENCOUNTER — Ambulatory Visit: Payer: Medicare Other | Admitting: Medical

## 2023-03-30 VITALS — BP 120/80 | HR 61 | Wt 239.2 lb

## 2023-03-30 DIAGNOSIS — M76899 Other specified enthesopathies of unspecified lower limb, excluding foot: Secondary | ICD-10-CM | POA: Diagnosis not present

## 2023-03-30 DIAGNOSIS — M79604 Pain in right leg: Secondary | ICD-10-CM | POA: Diagnosis not present

## 2023-03-30 DIAGNOSIS — M7918 Myalgia, other site: Secondary | ICD-10-CM | POA: Diagnosis not present

## 2023-03-30 DIAGNOSIS — M79605 Pain in left leg: Secondary | ICD-10-CM | POA: Diagnosis not present

## 2023-03-30 NOTE — Patient Instructions (Signed)
 Recommendations When you have worse acute pain use cold pack such as bag of ice water or cold towel to the area You can alternate hot bath with cold therapy such as hot bath followed by cold therapy for 20 minutes at a time Tylenol  as needed I strongly recommend doing massage at least twice a month Consider a stretching class or yoga class  Use the stretches I demonstrated today which include the following: Back extension and back flexion such as touching the toes or leaning back.  Do the stretching hold for 5 to 8 seconds.  Repeat each direction at least 5-8 times Doorway back rotation stretch, do each side and holding for 8 to 10 seconds at a time Cervical motion with your knee and hip, both clockwise and counterclockwise each leg.  Do 8 each side Plus sign stretch with the leg for leg extension, leg flexion, leg abduction and leg adduction.  Do both legs, at least 5 of each Kegel stretch are you lifting buttocks and contract the belly muscles and hold for 5 to 8 seconds at a time.  Do 10 of these at least daily Figure 4 thigh stretch.  While laying on your back cross your legs and grab the straight leg thigh and pull upwards to stretch the back of the leg and the butt  For the short-term, cut back on your physical activities.  Try different positions as well

## 2023-03-30 NOTE — Progress Notes (Signed)
 Subjective:  Emma [name] is a 68 y.o. female who presents for Chief Complaint  Patient presents with   pulled muscle    Pulls muscles in both gluteus x 1 month. Having trouble sitting and standing, bending, walking     Here for buttock pain bilaterally.  She has a new boyfriend and he has not had relations in about 7 years.  With Viagra he has been eager and they have been on average having sex relations every other day.  He states that he can only perform if he is standing up whilst she is lying on the bed with her legs up.  For last month she has been having fairly regular pain in the buttock/upper thigh posteriorly.  No bruising.  No numbness or tingling.  No back pain.  No belly pain.  No vaginal bleeding.  No vaginal pain.  No changes in urination.  No other aggravating or relieving factors.    No other c/o.  The following portions of the patient's history were reviewed and updated as appropriate: allergies, current medications, past family history, past medical history, past social history, past surgical history and problem list.  ROS Otherwise as in subjective above     Objective: BP 120/80   Pulse 61   Wt 239 lb 3.2 oz (108.5 kg)   BMI 37.46 kg/m   General appearance: alert, no distress, well developed, well nourished mildly tender in the paraspinal lumbar spine/upper buttocks gluteus maximus, no midline spine tenderness, she does have some pain in the buttocks with back extension and flexion Tender along the midline buttocks bilaterally top of buttocks down to sciatic notch Upper thighs only tender around the origin of the biceps for Morris muscle, otherwise legs nontender no other deformity.  Hip and leg range of motion within normal limits Seemingly normal leg strength and sensation Lower extremity pulses within normal limits    Assessment: Encounter Diagnoses  Name Primary?   Biceps femoris tendinitis Yes   Buttock pain    Pain in both lower extremities       Plan: We discussed her symptoms and concerns.  Advised that there is no specific neurological radicular or musculoskeletal problem that suggest imaging today.  No medicine is going to help this particular either because she is already on a muscle laxer.  She can continue Tylenol  as needed  We discussed the following stretches and home therapy exercises to try.  She will let me know if she wants to pursue physical therapy and I can make that referral  We discussed the following recommendations  Recommendations When you have worse acute pain use cold pack such as bag of ice water or cold towel to the area You can alternate hot bath with cold therapy such as hot bath followed by cold therapy for 20 minutes at a time Tylenol  as needed I strongly recommend doing massage at least twice a month Consider a stretching class or yoga class  Use the stretches I demonstrated today which include the following: Back extension and back flexion such as touching the toes or leaning back.  Do the stretching hold for 5 to 8 seconds.  Repeat each direction at least 5-8 times Doorway back rotation stretch, do each side and holding for 8 to 10 seconds at a time Cervical motion with your knee and hip, both clockwise and counterclockwise each leg.  Do 8 each side Plus sign stretch with the leg for leg extension, leg flexion, leg abduction and leg adduction.  Do both legs, at least 5 of each Kegel stretch are you lifting buttocks and contract the belly muscles and hold for 5 to 8 seconds at a time.  Do 10 of these at least daily Figure 4 thigh stretch.  While laying on your back cross your legs and grab the straight leg thigh and pull upwards to stretch the back of the leg and the butt  For the short-term, cut back on your physical activities.  Try different positions as well  Shalisha was seen today for pulled muscle.  Diagnoses and all orders for this visit:  Biceps femoris tendinitis  Buttock  pain  Pain in both lower extremities    Follow up: prn

## 2023-04-06 ENCOUNTER — Telehealth: Payer: Self-pay

## 2023-04-06 NOTE — Telephone Encounter (Signed)
 Spoke with pt regarding her PAP on [name] and [name],pt explain [facility] Dr is taking care of BOTH PAP for this year and has been APPROVED FOR 2025.

## 2023-04-14 ENCOUNTER — Other Ambulatory Visit: Payer: Self-pay | Admitting: Medical

## 2023-04-19 ENCOUNTER — Other Ambulatory Visit: Payer: Self-pay | Admitting: Medical

## 2023-05-19 ENCOUNTER — Other Ambulatory Visit: Payer: Self-pay | Admitting: Medical

## 2023-05-19 MED ORDER — LUBIPROSTONE 24 MCG PO CAPS: 24.0000 ug | ORAL_CAPSULE | Freq: Two times a day (BID) | ORAL | 0 refills | Status: DC

## 2023-05-21 ENCOUNTER — Other Ambulatory Visit: Payer: Self-pay | Admitting: Medical

## 2023-05-25 ENCOUNTER — Other Ambulatory Visit: Payer: Self-pay | Admitting: Medical

## 2023-05-25 NOTE — Telephone Encounter (Signed)
 Last apt 03/30/23.

## 2023-05-27 ENCOUNTER — Other Ambulatory Visit: Payer: Self-pay | Admitting: Medical

## 2023-05-27 ENCOUNTER — Ambulatory Visit: Payer: Self-pay | Admitting: Medical

## 2023-05-27 NOTE — Telephone Encounter (Signed)
 3rd attempt. Pt called, received message call cannot be completed. Will route to practice.    Copied from CRM [id number]. Topic: Clinical - Medical Advice >> May 27, 2023  1:11 PM Yolanda T wrote: Reason for CRM: patient stated she is having muscle and joint pain. She thinks its coming from taking  rosuvastatin (CRESTOR) 20 MG tablet. Patient wants to get a different medication in the place of it

## 2023-05-27 NOTE — Telephone Encounter (Signed)
 2nd call attempt. Received message call cannot be completed, consult your directory.

## 2023-05-27 NOTE — Telephone Encounter (Signed)
 Patient informed of Shane's instructions on cholesterol meds

## 2023-05-27 NOTE — Telephone Encounter (Signed)
 Pt called, unable to LVM d/t fast busy signal. # dialed x 2 with same result.   Copied from CRM [id number]. Topic: Clinical - Medical Advice >> May 27, 2023  1:11 PM Yolanda T wrote: Reason for CRM: patient stated she is having muscle and joint pain. She thinks its coming from taking  rosuvastatin (CRESTOR) 20 MG tablet. Patient wants to get a different medication in the place of it.

## 2023-06-15 ENCOUNTER — Telehealth: Payer: Self-pay | Admitting: Medical

## 2023-06-15 NOTE — Telephone Encounter (Signed)
 Copied from CRM [id number]. Topic: Referral - Request for Referral >> Jun 15, 2023  2:31 PM Elle L wrote: Did the patient discuss referral with their provider in the last year? Yes  Appointment offered? Yes, she declined.   Type of order/referral and detailed reason for visit: She is having muscle spasms and tightness and pain. She declined Nurse Triage.   Preference of office, provider, location: [facility]   If referral order, have you been seen by this specialty before? Yes, she was seen there previously but she needs a new referral since it has been too long since her last appointment.   Can we respond through MyChart? No, the patient's call back number is [phone].

## 2023-06-22 ENCOUNTER — Ambulatory Visit: Admitting: Medical

## 2023-06-27 ENCOUNTER — Other Ambulatory Visit: Payer: Self-pay | Admitting: Medical

## 2023-06-27 NOTE — Telephone Encounter (Signed)
 Last apt 03/30/23.

## 2023-06-28 ENCOUNTER — Other Ambulatory Visit: Payer: Self-pay | Admitting: Medical

## 2023-06-28 ENCOUNTER — Telehealth: Payer: Self-pay | Admitting: Internal Medicine

## 2023-06-28 MED ORDER — ALPRAZOLAM 0.5 MG PO TABS: ORAL_TABLET | ORAL | 0 refills | Status: DC

## 2023-06-28 NOTE — Telephone Encounter (Signed)
 Copied from CRM [id number]. Topic: Clinical - Medication Refill >> Jun 28, 2023  1:44 PM Patsy Lager T wrote: Most Recent Primary Care Visit:  Provider: Jac Canavan  Department: [facility]  Visit Type: ACUTE  Date: 03/30/2023  Medication: ALPRAZolam (XANAX) 0.5 MG tablet   Has the patient contacted their pharmacy? Yes   Is this the correct pharmacy for this prescription? Yes If no, delete pharmacy and type the correct one.  This is the patient's preferred pharmacy:  [id number] [place], [place] - [address] [place] [address] Phone: [phone] Fax: [phone]   Has the prescription been filled recently? Yes  Is the patient out of the medication? Yes  Has the patient been seen for an appointment in the last year OR does the patient have an upcoming appointment? Yes  Can we respond through MyChart? No  Agent: Please be advised that Rx refills may take up to 3 business days. We ask that you follow-up with your pharmacy.  Patient has been out of medication for 3-4 days and has not gotten any sleep

## 2023-06-28 NOTE — Telephone Encounter (Unsigned)
 Copied from CRM [id number]. Topic: Clinical - Medication Question >> Jun 28, 2023  3:05 PM Yolanda T wrote: Reason for CRM: patient called made a med refill appt for Wed 4/9 at 2:45pm and she is stating that she needs her medication ALPRAZolam (XANAX) 0.5 MG tablet for sleep tonight

## 2023-06-28 NOTE — Telephone Encounter (Signed)
 Is this okay to refill?

## 2023-06-29 ENCOUNTER — Other Ambulatory Visit: Payer: Self-pay | Admitting: Medical

## 2023-06-29 ENCOUNTER — Ambulatory Visit (INDEPENDENT_AMBULATORY_CARE_PROVIDER_SITE_OTHER): Admitting: Medical

## 2023-06-29 VITALS — BP 120/70 | HR 63 | Wt 243.8 lb

## 2023-06-29 DIAGNOSIS — E118 Type 2 diabetes mellitus with unspecified complications: Secondary | ICD-10-CM

## 2023-06-29 DIAGNOSIS — E1169 Type 2 diabetes mellitus with other specified complication: Secondary | ICD-10-CM | POA: Diagnosis not present

## 2023-06-29 DIAGNOSIS — R809 Proteinuria, unspecified: Secondary | ICD-10-CM

## 2023-06-29 DIAGNOSIS — I10 Essential (primary) hypertension: Secondary | ICD-10-CM

## 2023-06-29 DIAGNOSIS — Z1231 Encounter for screening mammogram for malignant neoplasm of breast: Secondary | ICD-10-CM

## 2023-06-29 DIAGNOSIS — M069 Rheumatoid arthritis, unspecified: Secondary | ICD-10-CM

## 2023-06-29 DIAGNOSIS — M609 Myositis, unspecified: Secondary | ICD-10-CM | POA: Insufficient documentation

## 2023-06-29 DIAGNOSIS — M8580 Other specified disorders of bone density and structure, unspecified site: Secondary | ICD-10-CM

## 2023-06-29 DIAGNOSIS — M7989 Other specified soft tissue disorders: Secondary | ICD-10-CM

## 2023-06-29 DIAGNOSIS — N1832 Chronic kidney disease, stage 3b: Secondary | ICD-10-CM

## 2023-06-29 DIAGNOSIS — G4700 Insomnia, unspecified: Secondary | ICD-10-CM

## 2023-06-29 DIAGNOSIS — Z780 Asymptomatic menopausal state: Secondary | ICD-10-CM | POA: Insufficient documentation

## 2023-06-29 DIAGNOSIS — Z113 Encounter for screening for infections with a predominantly sexual mode of transmission: Secondary | ICD-10-CM

## 2023-06-29 DIAGNOSIS — M2550 Pain in unspecified joint: Secondary | ICD-10-CM

## 2023-06-29 DIAGNOSIS — E559 Vitamin D deficiency, unspecified: Secondary | ICD-10-CM

## 2023-06-29 DIAGNOSIS — I872 Venous insufficiency (chronic) (peripheral): Secondary | ICD-10-CM

## 2023-06-29 DIAGNOSIS — D649 Anemia, unspecified: Secondary | ICD-10-CM | POA: Diagnosis not present

## 2023-06-29 DIAGNOSIS — Z7185 Encounter for immunization safety counseling: Secondary | ICD-10-CM

## 2023-06-29 MED ORDER — GABAPENTIN 300 MG PO CAPS: 300.0000 mg | ORAL_CAPSULE | Freq: Every day | ORAL | Status: DC | PRN

## 2023-06-29 MED ORDER — PRAVASTATIN SODIUM 20 MG PO TABS: 20.0000 mg | ORAL_TABLET | Freq: Every day | ORAL | 1 refills | Status: DC

## 2023-06-29 NOTE — Patient Instructions (Signed)
Please call to schedule your mammogram and bone density.   [facility]  [phone] [address], [address] [place], [place] [address]

## 2023-06-29 NOTE — Progress Notes (Signed)
 Subjective:  Emma [name] is a 68 y.o. female who presents for Chief Complaint  Patient presents with   Medical Management of Chronic Issues    Med check, legs and ankles are still swelling and red. No eye exam done     Dr. Celso Amy, nephrology Dr. Ileene Patrick, GI Dr. Lorine Bears, Dr. Riley Lam, cardiology Dr. Waverly Ferrari, vascular surgery Deisy Ozbun, Kermit Balo, PA-C here for primary care   Concerns: Emma [name] is taking Soliqua 15 units daily, [name] 10 mg daily for CKD and diabetes.  Checking blood sugars, and lately mostly under 115 readings.  Past due for eye doctor appt.  Hypertension-compliant with verapamil SR 240 mg daily for blood pressure and migraines.  Hyperlipidemia-not currently taking any medication.  Was having recent problems with rosuvastatin and stopped this.  Had been on this for years, and felt like recently when stopped Crestor about a month ago, does feel joint pains are some improved.  been off Crestor for about a month.  CKD-compliant with [name] 10 mg daily  was on [name] 10 mg daily, but this was causing yeast infections.  Hasn't been on [name] in a month.  Chronic insomnia- she continues on mirtazapine 15 mg nightly, but not sure it helps.  She knows though if she misses her Xanax definitely cannot sleep.  She cannot use gabapentin nightly for sleep because it constipates her. She has several big bottles of gabapentin left but she does not also want to change the dose of gabapentin  Constipation - uses amitiza prn, not daily  History of seronegative polyarthralgia, chronic joint pain, chronic pain in the past at 1 point it was thought that she maybe had rheumatoid arthritis and then it was felt she was in remission.  She has a prescription for gabapentin 300 mg 3 times a day, but on average takes Gabapentin 300mg  once daily,  hasn't been using Soma muscle relaxer  Uses Buspar 7.5mg  BID for abdominal spasm and  nervous stomach, and this seems to help.  Presribed by GI.  She has a new "friend" She has been sexually active with.  She has not had STD testing in quite a while  No other aggravating or relieving factors.    No other c/o.  Past Medical History:  Diagnosis Date   Chronic constipation    CKD (chronic kidney disease), stage III (HCC)    Cluster headache syndrome 04/27/2013   Colon polyp 06/11/2011   Most recent 04/23/08, no polyp--mild diverticulosis.  Repeat 10 yrs.   Depression with anxiety 02/22/2008   Qualifier: Diagnosis of  By: Creta Levin CMA (AAMA), Robin     Diabetes mellitus without complication (HCC)    Diverticulosis    GERD (gastroesophageal reflux disease) 06/11/2011   Hypertension    Recently diagnosed   Migraine syndrome    Osteopenia 11/2016   bone density. plan repeat in 11/2018   Overweight(278.02) 06/11/2011   PUD (peptic ulcer disease) 04/23/2008   Multiple antrum ulcers; h pylori neg   Rheumatoid arthritis(714.0)    Current Outpatient Medications on File Prior to Visit  Medication Sig Dispense Refill   acetaminophen (TYLENOL) 500 MG tablet Take 1,500 mg by mouth once as needed for mild pain or moderate pain.     ALPRAZolam (XANAX) 0.5 MG tablet TAKE 1 TABLET BY MOUTH EVERYDAY AT BEDTIME 15 tablet 0   busPIRone (BUSPAR) 7.5 MG tablet TAKE 1 TABLET BY MOUTH TWICE A DAY 180 tablet 1   calcitRIOL (ROCALTROL) 0.25 MCG capsule Take  0.25 mcg by mouth 3 (three) times a week.     calcium carbonate ([facility] CALCIUM) 1500 (600 Ca) MG TABS tablet TAKE 1 TABLET (600 MG TOTAL) BY MOUTH 2 (TWO) TIMES DAILY WITH A MEAL. 150 tablet 1   Cholecalciferol 50 MCG (2000 UT) CAPS 1 capsule Orally once weekly     Insulin Glargine-Lixisenatide (SOLIQUA) 100-33 UNT-MCG/ML SOPN Inject 15 Units into the skin daily. 3 mL 2   [name] 10 MG TABS Take 1 tablet by mouth daily.     lubiprostone (AMITIZA) 24 MCG capsule Take 1 capsule (24 mcg total) by mouth 2 (two) times daily with a meal. 180  capsule 0   methylcellulose (CITRUCEL) oral powder Take 1 packet by mouth daily.     Multiple Vitamins-Iron (MULTIVITAMINS WITH IRON) TABS tablet Take 1 tablet by mouth daily. 90 tablet 0   omeprazole (PRILOSEC) 40 MG capsule TAKE 1 CAPSULE (40 MG TOTAL) BY MOUTH DAILY. 90 capsule 1   Rectal Protectant-Emollient (CALMOL-4) 76-10 % SUPP Use as directed once to twice daily 6 suppository 0   verapamil (CALAN-SR) 240 MG CR tablet TAKE 1 TABLET BY MOUTH EVERYDAY AT BEDTIME 90 tablet 1   AMBULATORY NON FORMULARY MEDICATION Diltiazem gel with 2% lidocaine  Apply a pea sized amount into your rectum three times daily until healed Dispense 30 GM zero refill 30 g 0   Blood Glucose Monitoring Suppl DEVI 1 each by Does not apply route daily. Patient to test 1-2 times daily. DX E11.9 1 each 0   Glucose Blood (BLOOD GLUCOSE TEST STRIPS) STRP TEST 1-2 TIMES DAILY DX E11.9 100 strip 2   No current facility-administered medications on file prior to visit.     The following portions of the patient's history were reviewed and updated as appropriate: allergies, current medications, past family history, past medical history, past social history, past surgical history and problem list.  ROS Otherwise as in subjective above    Objective: BP 120/70   Pulse 63   Wt 243 lb 12.8 oz (110.6 kg)   BMI 38.18 kg/m   General appearance: alert, no distress, well developed, well nourished Neck: supple, no lymphadenopathy, no thyromegaly, no masses, no JVD or bruit Heart: RRR, normal S1, S2, no murmurs Lungs: CTA bilaterally, no wheezes, rhonchi, or rales Pulses: 2+ radial pulses, 2+ pedal pulses, normal cap refill Ext: 1+ nonpitting lower extremity edema and some pink discoloration of her lower legs that blanches bilaterally lower extremity     Assessment: Encounter Diagnoses  Name Primary?   Hyperlipidemia associated with type 2 diabetes mellitus (HCC) Yes   Essential hypertension    Diabetes mellitus with  complication (HCC)    Anemia, unspecified type    Chronic venous insufficiency    Polyarthralgia    Microalbuminuria    Leg swelling    Insomnia, unspecified type    Vitamin D deficiency    Stage 3b chronic kidney disease (HCC)    Rheumatoid arthritis, involving unspecified site, unspecified whether rheumatoid factor present (HCC)    Osteopenia, unspecified location    Vaccine counseling    Postmenopausal estrogen deficiency    Screening mammogram for breast cancer    Screen for STD (sexually transmitted disease)    Statin-induced myositis      Plan: Hyperlipidemia-she did not tolerate Crestor due to muscle and joint pains.  Restart trial of Pravachol different statin and see how she tolerates this  Hypertension-continue verapamil SR 240 mg daily  Diabetes-compliant with Soliqua injection 15 units  nightly.  Updated labs today she is past due with eye doctor.  Advise she go ahead and get her diabetic eye exam soon  History of anemia-she does have blood work done this morning by nephrology office  Chronic venous insufficiency-advised compression hose daily, routine exercise  Polyarthralgia, history of suspected rheumatoid disease but possibly in remission-she uses gabapentin on average once daily or sometimes not at all but definitely not 3 times a day.  I updated her med list today since she is not using it 3 times a day as it was prescribed prior  CKD 3B-she saw her nephrologist today and had labs this morning.  Await labs and report.  Continue on [place].  She was on [name] but stopped this due to recurrent yeast infection.  Constipation-she continues Amitiza  Regarding as needed.  Increased doses of gabapentin seems to make her constipated as well  Osteopenia-call and schedule bone density test soon  Call to schedule mammogram soon  Updated STD screen today  Insomnia-discontinue temazepam as it does not seem to be making a difference in her sleep.  We discussed  avoidance of multiple sedating medicines.  Fortunately she has stopped Soma at some point    Madinah was seen today for medical management of chronic issues.  Diagnoses and all orders for this visit:  Hyperlipidemia associated with type 2 diabetes mellitus (HCC)  Essential hypertension  Diabetes mellitus with complication (HCC) -     Hemoglobin A1c  Anemia, unspecified type  Chronic venous insufficiency  Polyarthralgia  Microalbuminuria  Leg swelling  Insomnia, unspecified type  Vitamin D deficiency -     VITAMIN D 25 Hydroxy (Vit-D Deficiency, Fractures)  Stage 3b chronic kidney disease (HCC)  Rheumatoid arthritis, involving unspecified site, unspecified whether rheumatoid factor present (HCC)  Osteopenia, unspecified location  Vaccine counseling  Postmenopausal estrogen deficiency -     DG Bone Density; Future  Screening mammogram for breast cancer -     MM 3D SCREENING MAMMOGRAM BILATERAL BREAST  Screen for STD (sexually transmitted disease) -     RPR+HIV+GC+CT Panel -     Hepatitis C antibody -     Hepatitis B surface antigen  Statin-induced myositis  Other orders -     pravastatin (PRAVACHOL) 20 MG tablet; Take 1 tablet (20 mg total) by mouth daily. -     gabapentin (NEURONTIN) 300 MG capsule; Take 1 capsule (300 mg total) by mouth daily as needed.   Spent > 30 minutes face to face with patient in discussion of symptoms, evaluation, plan and recommendations.    Follow up: pending labs

## 2023-06-30 ENCOUNTER — Other Ambulatory Visit: Payer: Self-pay | Admitting: Medical

## 2023-06-30 LAB — LAB REPORT - SCANNED
Calcium: 9.2
EGFR: 36
PTH: 49

## 2023-06-30 LAB — HEMOGLOBIN A1C
Est. average glucose Bld gHb Est-mCnc: 180 mg/dL
Hgb A1c MFr Bld: 7.9 % — ABNORMAL HIGH (ref 4.8–5.6)

## 2023-06-30 LAB — VITAMIN D 25 HYDROXY (VIT D DEFICIENCY, FRACTURES): Vit D, 25-Hydroxy: 26.7 ng/mL — ABNORMAL LOW (ref 30.0–100.0)

## 2023-06-30 MED ORDER — ALPRAZOLAM 0.5 MG PO TABS: ORAL_TABLET | ORAL | 1 refills | Status: DC

## 2023-06-30 MED ORDER — LUBIPROSTONE 24 MCG PO CAPS: 24.0000 ug | ORAL_CAPSULE | Freq: Two times a day (BID) | ORAL | 0 refills | Status: DC

## 2023-06-30 MED ORDER — GABAPENTIN 300 MG PO CAPS: 300.0000 mg | ORAL_CAPSULE | Freq: Every day | ORAL | Status: DC | PRN

## 2023-06-30 MED ORDER — SOLIQUA 100-33 UNT-MCG/ML ~~LOC~~ SOPN: 20.0000 [IU] | PEN_INJECTOR | Freq: Every day | SUBCUTANEOUS | 2 refills | Status: DC

## 2023-06-30 MED ORDER — OMEPRAZOLE 40 MG PO CPDR: 40.0000 mg | DELAYED_RELEASE_CAPSULE | Freq: Every day | ORAL | 1 refills | Status: DC

## 2023-06-30 NOTE — Progress Notes (Signed)
 Vitamin D is still low, diabetes marker is too high at 7.9%, negative for hepatitis B, negative hepatitis C, negative for HIV and syphilis.  Still pending gonorrhea and Chlamydia test.  Begin trial of Pravachol cholesterol medicine/pravastatin  Lets change to the weekly 50,000 unit vitamin D prescription   Increase Soliqua to 20 units daily.  After 2 weeks you can go up 2 units/week until fasting morning sugars are less than 130  Lets recheck in 6 to 8 weeks fasting given the med changes we made today

## 2023-07-02 LAB — RPR+HIV+GC+CT PANEL
Chlamydia trachomatis, NAA: NEGATIVE
HIV Screen 4th Generation wRfx: NONREACTIVE
Neisseria Gonorrhoeae by PCR: NEGATIVE
RPR Ser Ql: NONREACTIVE

## 2023-07-02 LAB — HEPATITIS B SURFACE ANTIGEN: Hepatitis B Surface Ag: NEGATIVE

## 2023-07-02 LAB — HEPATITIS C ANTIBODY: Hep C Virus Ab: NONREACTIVE

## 2023-07-12 ENCOUNTER — Other Ambulatory Visit: Payer: Self-pay | Admitting: Medical

## 2023-07-12 ENCOUNTER — Telehealth: Payer: Self-pay | Admitting: Medical

## 2023-07-12 MED ORDER — CALCIUM CARBONATE 1500 (600 CA) MG PO TABS: 600.0000 mg | ORAL_TABLET | Freq: Two times a day (BID) | ORAL | 2 refills | Status: AC

## 2023-07-12 NOTE — Telephone Encounter (Signed)
 Pt is requesting a refill on calcium  sent to [id number] - [place], [place] - [address]

## 2023-07-13 ENCOUNTER — Telehealth: Payer: Self-pay | Admitting: Medical

## 2023-07-13 ENCOUNTER — Other Ambulatory Visit: Payer: Self-pay | Admitting: Medical

## 2023-07-13 ENCOUNTER — Encounter: Payer: Self-pay | Admitting: Internal Medicine

## 2023-07-13 NOTE — Telephone Encounter (Unsigned)
 Copied from CRM [id number]. Topic: Clinical - Medication Refill >> Jul 13, 2023 11:53 AM [name] wrote: Most Recent Primary Care Visit:  Provider: Claudene Crystal  Department: [facility]  Visit Type: OFFICE VISIT  Date: 06/29/2023  Medication: ALPRAZolam  (XANAX ) 0.5 MG tablet  Has the patient contacted their pharmacy? Yes (Agent: If no, request that the patient contact the pharmacy for the refill. If patient does not wish to contact the pharmacy document the reason why and proceed with request.) (Agent: If yes, when and what did the pharmacy advise?)  Is this the correct pharmacy for this prescription? Yes If no, delete pharmacy and type the correct one.  This is the patient's preferred pharmacy:  [id number] [place], [place] - [address] [place] [address] Phone: [phone] Fax: [phone]     Has the prescription been filled recently? No  Is the patient out of the medication? Yes  Has the patient been seen for an appointment in the last year OR does the patient have an upcoming appointment? Yes  Can we respond through MyChart? Yes  Agent: Please be advised that Rx refills may take up to 3 business days. We ask that you follow-up with your pharmacy.

## 2023-07-13 NOTE — Telephone Encounter (Unsigned)
 Copied from CRM [id number]. Topic: Clinical - Prescription Issue >> Jul 13, 2023 11:56 AM [name] wrote: Reason for CRM: [facility] calcium  carbonate ([facility] CALCIUM ) 1500 (600 Ca) MG TABS tablet prescribed, and the insurance will not pay for it. Another prescription needs to be sent to the pharmacy that is covered by patient's insurance. Callback number is [phone]

## 2023-07-14 ENCOUNTER — Telehealth: Payer: Self-pay | Admitting: Internal Medicine

## 2023-07-14 NOTE — Telephone Encounter (Signed)
 Copied from CRM [id number]. Topic: Clinical - Medication Question >> Jul 14, 2023  2:52 PM Emma [name] wrote: Reason for CRM: patient called stated today the pharmacy handed her a box of Tums and she wants to make sure that was the medication provider sent over for her to take

## 2023-08-02 ENCOUNTER — Ambulatory Visit: Payer: Self-pay

## 2023-08-02 ENCOUNTER — Other Ambulatory Visit: Payer: Self-pay | Admitting: Medical

## 2023-08-02 ENCOUNTER — Encounter: Payer: Self-pay | Admitting: Medical

## 2023-08-02 MED ORDER — EPINEPHRINE 0.3 MG/0.3ML IJ SOAJ: 0.3000 mg | INTRAMUSCULAR | 0 refills | Status: DC | PRN

## 2023-08-02 NOTE — Telephone Encounter (Signed)
 Copied from CRM [id number]. Topic: Clinical - Medication Refill >> Aug 02, 2023 11:41 AM Zipporah Him wrote: Medication: REQUESTING GENERIC OF EPIPEN  SENT OVER TO PHARMACY, DID NOT KNOW THE NAME.  Has the patient contacted their pharmacy? Yes Stated they have a cheaper, generic, epipen  in stock.  This is the patient's preferred pharmacy:  [id number] [place], [place] - [address] [place] [address] Phone: [phone] Fax: [phone]   Is this the correct pharmacy for this prescription? Yes If no, delete pharmacy and type the correct one.   Has the prescription been filled recently? No  Is the patient out of the medication? Yes  Has the patient been seen for an appointment in the last year OR does the patient have an upcoming appointment? Yes  Can we respond through MyChart? Yes  Agent: Please be advised that Rx refills may take up to 3 business days. We ask that you follow-up with your pharmacy.

## 2023-08-02 NOTE — Telephone Encounter (Signed)
 Medication: REQUESTING GENERIC OF EPIPEN  SENT OVER TO PHARMACY, DID NOT KNOW THE NAME.   Has the patient contacted their pharmacy? Yes Stated they have a cheaper, generic, epipen  in stock.   This is the patient's preferred pharmacy:  [id number] [place], [place] - [address] [place] [address] Phone: [phone] Fax: [phone]

## 2023-08-02 NOTE — Telephone Encounter (Signed)
 This encounter was created in error - please disregard.

## 2023-08-25 ENCOUNTER — Ambulatory Visit (INDEPENDENT_AMBULATORY_CARE_PROVIDER_SITE_OTHER): Admitting: Medical

## 2023-08-25 VITALS — BP 112/76 | HR 68 | Temp 97.2°F | Ht 67.0 in | Wt 246.6 lb

## 2023-08-25 DIAGNOSIS — N1832 Chronic kidney disease, stage 3b: Secondary | ICD-10-CM

## 2023-08-25 DIAGNOSIS — R6889 Other general symptoms and signs: Secondary | ICD-10-CM | POA: Diagnosis not present

## 2023-08-25 DIAGNOSIS — I10 Essential (primary) hypertension: Secondary | ICD-10-CM

## 2023-08-25 DIAGNOSIS — E538 Deficiency of other specified B group vitamins: Secondary | ICD-10-CM

## 2023-08-25 DIAGNOSIS — R5383 Other fatigue: Secondary | ICD-10-CM

## 2023-08-25 DIAGNOSIS — E559 Vitamin D deficiency, unspecified: Secondary | ICD-10-CM | POA: Diagnosis not present

## 2023-08-25 MED ORDER — CYANOCOBALAMIN 1000 MCG/ML IJ SOLN
1000.0000 ug | Freq: Once | INTRAMUSCULAR | Status: AC
  Administered 2023-08-25: 1000 ug via INTRAMUSCULAR

## 2023-08-25 MED ORDER — VITAMIN D (ERGOCALCIFEROL) 1.25 MG (50000 UNIT) PO CAPS: 50000.0000 [IU] | ORAL_CAPSULE | ORAL | 3 refills | Status: DC

## 2023-08-25 NOTE — Progress Notes (Signed)
 Subjective:  Emma [name] is a 68 y.o. female who presents for Chief Complaint  Patient presents with   Acute Visit    Chills and fatigue going on a couple months. No other symptoms. Thought it would get better     Medical team: Patient Care Team: Aaliyan Brinkmeier, Christiane Cowing, PA-C as PCP - General (Family Medicine) Jann Melody, MD as PCP - Cardiology (Cardiology) Dr. Woodie Hazard, nephrology Dr. Alvester Johnson, GI   Here for complaint of chills and fatigue that she reports has been going on for months.   No fever, no night sweats.   Eats 3 meals daily.  No blood in urine or stool.   No new headaches.    Extreme fatigue.  Sometimes feels lightheaded even in the shower washing hair.  No chest pain,no dyspnea, no worse swelling in legs.   Gets tired walking to mailbox.    No known snoring or restless sleep.  No daytime somnolence.  No prior sleep study.  Wants b12 shot to help boost energy  She is seeing her kidney doctor regularly as usual.  No recent medication changes.  She is not currently on vitamin D  supplement.  She is compliant with her other medicines as usual including buspirone  for anxiety  Prophylactic medication for diabetes and blood pressure and cholesterol  No other aggravating or relieving factors.    No other c/o.  Past Medical History:  Diagnosis Date   Chronic constipation    CKD (chronic kidney disease), stage III (HCC)    Cluster headache syndrome 04/27/2013   Colon polyp 06/11/2011   Most recent 04/23/08, no polyp--mild diverticulosis.  Repeat 10 yrs.   Depression with anxiety 02/22/2008   Qualifier: Diagnosis of  By: Tereasa Felty CMA (AAMA), Robin     Diabetes mellitus without complication (HCC)    Diverticulosis    GERD (gastroesophageal reflux disease) 06/11/2011   Hypertension    Recently diagnosed   Migraine syndrome    Osteopenia 11/2016   bone density. plan repeat in 11/2018   Overweight(278.02) 06/11/2011   PUD (peptic ulcer  disease) 04/23/2008   Multiple antrum ulcers; h pylori neg   Rheumatoid arthritis(714.0)    Current Outpatient Medications on File Prior to Visit  Medication Sig Dispense Refill   acetaminophen  (TYLENOL ) 500 MG tablet Take 1,500 mg by mouth once as needed for mild pain or moderate pain.     ALPRAZolam  (XANAX ) 0.5 MG tablet TAKE 1 TABLET BY MOUTH EVERYDAY AT BEDTIME 30 tablet 1   busPIRone  (BUSPAR ) 7.5 MG tablet TAKE 1 TABLET BY MOUTH TWICE A DAY 180 tablet 1   calcitRIOL (ROCALTROL) 0.25 MCG capsule Take 0.25 mcg by mouth 3 (three) times a week.     calcium  carbonate ([facility] CALCIUM ) 1500 (600 Ca) MG TABS tablet Take 1 tablet (1,500 mg total) by mouth 2 (two) times daily with a meal. 180 tablet 2   gabapentin  (NEURONTIN ) 300 MG capsule Take 1 capsule (300 mg total) by mouth daily as needed.     Insulin  Glargine-Lixisenatide (SOLIQUA ) 100-33 UNT-MCG/ML SOPN Inject 20 Units into the skin daily. 15 mL 2   [name] 10 MG TABS Take 1 tablet by mouth daily.     methylcellulose (CITRUCEL) oral powder Take 1 packet by mouth daily.     Multiple Vitamins-Iron  (MULTIVITAMINS WITH IRON ) TABS tablet Take 1 tablet by mouth daily. 90 tablet 0   omeprazole  (PRILOSEC) 40 MG capsule Take 1 capsule (40 mg total) by mouth daily. 90 capsule 1  pravastatin  (PRAVACHOL ) 20 MG tablet Take 1 tablet (20 mg total) by mouth daily. 90 tablet 1   Rectal Protectant-Emollient (CALMOL-4) 76-10 % SUPP Use as directed once to twice daily 6 suppository 0   verapamil  (CALAN -SR) 240 MG CR tablet TAKE 1 TABLET BY MOUTH EVERYDAY AT BEDTIME 90 tablet 1   AMBULATORY NON FORMULARY MEDICATION Diltiazem gel with 2% lidocaine  Apply a pea sized amount into your rectum three times daily until healed Dispense 30 GM zero refill 30 g 0   Blood Glucose Monitoring Suppl DEVI 1 each by Does not apply route daily. Patient to test 1-2 times daily. DX E11.9 1 each 0   EPINEPHrine  (EPIPEN  2-PAK) 0.3 mg/0.3 mL IJ SOAJ injection Inject 0.3 mg into the  muscle as needed for anaphylaxis. 1 each 0   Glucose Blood (BLOOD GLUCOSE TEST STRIPS) STRP TEST 1-2 TIMES DAILY DX E11.9 100 strip 2   No current facility-administered medications on file prior to visit.     The following portions of the patient's history were reviewed and updated as appropriate: allergies, current medications, past family history, past medical history, past social history, past surgical history and problem list.  ROS Otherwise as in subjective above    Objective: BP 112/76   Pulse 68   Temp (!) 97.2 F (36.2 C)   Ht 5\' 7"  (1.702 m)   Wt 246 lb 9.6 oz (111.9 kg)   BMI 38.62 kg/m   Wt Readings from Last 3 Encounters:  08/25/23 246 lb 9.6 oz (111.9 kg)  06/29/23 243 lb 12.8 oz (110.6 kg)  03/30/23 239 lb 3.2 oz (108.5 kg)   General appearance: alert, no distress, well developed, well nourished Oral cavity: MMM, no lesions Neck: supple, no lymphadenopathy, no thyromegaly, no masses Heart: RRR, normal S1, S2, no murmurs Lungs: CTA bilaterally, no wheezes, rhonchi, or rales Pulses: 2+ radial pulses, 2+ pedal pulses, normal cap refill Ext: no edema  Diabetic Foot Exam - Simple   Simple Foot Form Diabetic Foot exam was performed with the following findings: Yes 08/25/2023 12:42 PM  Visual Inspection See comments: Yes Sensation Testing Intact to touch and monofilament testing bilaterally: Yes Pulse Check Posterior Tibialis and Dorsalis pulse intact bilaterally: Yes Comments 1+ pedal pulses, sensation slightly reduced with monofilament in both feet, spider veins noted of bilateral medial feet      EKG reviewed, sinus bradycardia and sinus arrhythmia    Assessment: Encounter Diagnoses  Name Primary?   Fatigue, unspecified type Yes   Cold feeling    Essential hypertension    Vitamin D  deficiency    Stage 3b chronic kidney disease (HCC)    B12 deficiency      Plan: We discussed the differential for fatigue which could be fairly wide.  EKG  reviewed.  Labs today.  She has several known chronic disease states including chronic kidney disease, diabetes, vitamin D  deficiency, IBS, GERD.  We discussed we may end up getting a sleep study as well  Of note her last echocardiogram in 2023 showed pulmonic valve regurgitation as moderately severe, otherwise EF 60 to 65%, no regional wall motion abnormalities.  B12 injection given today  Restart vitamin D  supplement   Vista was seen today for acute visit.  Diagnoses and all orders for this visit:  Fatigue, unspecified type -     TSH + free T4 -     CBC -     EKG 12-Lead -     Comprehensive metabolic panel with GFR  Cold feeling -     TSH + free T4 -     CBC  Essential hypertension -     EKG 12-Lead -     Comprehensive metabolic panel with GFR  Vitamin D  deficiency  Stage 3b chronic kidney disease (HCC) -     Comprehensive metabolic panel with GFR  B12 deficiency -     cyanocobalamin (VITAMIN B12) injection 1,000 mcg    Follow up: pending labs

## 2023-08-27 LAB — CBC
Hematocrit: 35 % (ref 34.0–46.6)
Hemoglobin: 11.2 g/dL (ref 11.1–15.9)
MCH: 30 pg (ref 26.6–33.0)
MCHC: 32 g/dL (ref 31.5–35.7)
MCV: 94 fL (ref 79–97)
Platelets: 279 10*3/uL (ref 150–450)
RBC: 3.73 x10E6/uL — ABNORMAL LOW (ref 3.77–5.28)
RDW: 12.4 % (ref 11.7–15.4)
WBC: 7 10*3/uL (ref 3.4–10.8)

## 2023-08-27 LAB — COMPREHENSIVE METABOLIC PANEL WITH GFR
ALT: 12 IU/L (ref 0–32)
AST: 14 IU/L (ref 0–40)
Albumin: 4.1 g/dL (ref 3.9–4.9)
Alkaline Phosphatase: 92 IU/L (ref 44–121)
BUN/Creatinine Ratio: 16 (ref 12–28)
BUN: 33 mg/dL — ABNORMAL HIGH (ref 8–27)
Bilirubin Total: <0.2 mg/dL (ref 0.0–1.2)
CO2: 18 mmol/L — ABNORMAL LOW (ref 20–29)
Calcium: 8.5 mg/dL — ABNORMAL LOW (ref 8.7–10.3)
Chloride: 106 mmol/L (ref 96–106)
Creatinine, Ser: 2.02 mg/dL — ABNORMAL HIGH (ref 0.57–1.00)
Globulin, Total: 3 g/dL (ref 1.5–4.5)
Glucose: 168 mg/dL — ABNORMAL HIGH (ref 70–99)
Potassium: 4.6 mmol/L (ref 3.5–5.2)
Sodium: 141 mmol/L (ref 134–144)
Total Protein: 7.1 g/dL (ref 6.0–8.5)
eGFR: 26 mL/min/{1.73_m2} — ABNORMAL LOW (ref >59)

## 2023-08-27 LAB — TSH+FREE T4
Free T4: 1.11 ng/dL (ref 0.82–1.77)
TSH: 2.62 u[IU]/mL (ref 0.450–4.500)

## 2023-08-28 ENCOUNTER — Ambulatory Visit: Payer: Self-pay | Admitting: Medical

## 2023-08-28 NOTE — Progress Notes (Signed)
 Thyroid  okay, blood counts okay liver test okay kidney marker relatively stable  Restart vitamin D  supplement.  Consider sleep study.  If agreeable I can refer you for this  Consider follow-up with your cardiologist since the last ultrasound of your heart was 2 years ago

## 2023-08-29 ENCOUNTER — Ambulatory Visit

## 2023-08-29 LAB — HM MAMMOGRAPHY

## 2023-08-30 ENCOUNTER — Other Ambulatory Visit: Payer: Self-pay | Admitting: Medical

## 2023-08-30 DIAGNOSIS — R5383 Other fatigue: Secondary | ICD-10-CM

## 2023-08-30 DIAGNOSIS — I10 Essential (primary) hypertension: Secondary | ICD-10-CM

## 2023-08-30 DIAGNOSIS — I517 Cardiomegaly: Secondary | ICD-10-CM

## 2023-08-30 DIAGNOSIS — E1169 Type 2 diabetes mellitus with other specified complication: Secondary | ICD-10-CM

## 2023-08-30 DIAGNOSIS — E669 Obesity, unspecified: Secondary | ICD-10-CM

## 2023-08-30 DIAGNOSIS — E118 Type 2 diabetes mellitus with unspecified complications: Secondary | ICD-10-CM

## 2023-08-30 DIAGNOSIS — R609 Edema, unspecified: Secondary | ICD-10-CM

## 2023-08-31 ENCOUNTER — Encounter: Payer: Self-pay | Admitting: Medical

## 2023-09-05 ENCOUNTER — Ambulatory Visit: Payer: Self-pay | Admitting: Internal Medicine

## 2023-09-11 NOTE — Progress Notes (Signed)
Results sent by my chart

## 2023-09-12 ENCOUNTER — Other Ambulatory Visit: Payer: Self-pay | Admitting: Medical

## 2023-09-12 NOTE — Telephone Encounter (Signed)
 Copied from CRM [id number]. Topic: Clinical - Medication Refill >> Sep 12, 2023  1:10 PM Delon HERO wrote: Medication: ALPRAZolam  (XANAX ) 0.5 MG tablet [[id number]]  Has the patient contacted their pharmacy? Yes (Agent: If no, request that the patient contact the pharmacy for the refill. If patient does not wish to contact the pharmacy document the reason why and proceed with request.) (Agent: If yes, when and what did the pharmacy advise?)  This is the patient's preferred pharmacy:  [id number] - [place], [place] - [address] [place] [address] Phone: [phone] Fax: [phone]  [id number] [name],  - [address] [name] [address] Phone: [phone] Fax: [phone]  Is this the correct pharmacy for this prescription? Yes If no, delete pharmacy and type the correct one.   Has the prescription been filled recently? Yes  Is the patient out of the medication? Yes took last pill on Saturday  Has the patient been seen for an appointment in the last year OR does the patient have an upcoming appointment? Yes  Can we respond through MyChart? Yes  Agent: Please be advised that Rx refills may take up to 3 business days. We ask that you follow-up with your pharmacy.

## 2023-09-12 NOTE — Telephone Encounter (Signed)
 Last apt 06/29/23.

## 2023-09-12 NOTE — Telephone Encounter (Signed)
 Duplicate can't refuse on my end.

## 2023-09-13 ENCOUNTER — Telehealth: Payer: Self-pay | Admitting: Medical

## 2023-09-13 ENCOUNTER — Ambulatory Visit: Payer: Self-pay

## 2023-09-13 ENCOUNTER — Other Ambulatory Visit: Payer: Self-pay | Admitting: Medical

## 2023-09-13 MED ORDER — PEN NEEDLES 31G X 8 MM MISC: 1.0000 | Freq: Every day | 3 refills | Status: AC

## 2023-09-13 NOTE — Telephone Encounter (Signed)
 Pt states that she has not medical concerns, has only been calling to get her xanax  refilled.

## 2023-09-13 NOTE — Telephone Encounter (Signed)
 [facility]

## 2023-09-13 NOTE — Telephone Encounter (Signed)
 Is this okay to refill?

## 2023-09-21 ENCOUNTER — Telehealth: Payer: Self-pay | Admitting: Internal Medicine

## 2023-09-21 ENCOUNTER — Other Ambulatory Visit: Payer: Self-pay | Admitting: Medical

## 2023-09-21 MED ORDER — BENZONATATE 200 MG PO CAPS
200.0000 mg | ORAL_CAPSULE | Freq: Three times a day (TID) | ORAL | 0 refills | Status: DC | PRN
Start: 2023-09-21 — End: 2023-12-01

## 2023-09-21 NOTE — Telephone Encounter (Unsigned)
 Copied from CRM [id number]. Topic: Clinical - Medication Question >> Sep 21, 2023 12:46 PM Silvana PARAS wrote: Reason for CRM: Pt calling to request medication be sent to her preferred pharmacy on file due to cold and cough. Pt declined an appt. Callback number is [phone].

## 2023-09-26 ENCOUNTER — Telehealth: Payer: Self-pay | Admitting: Medical

## 2023-09-26 ENCOUNTER — Ambulatory Visit: Payer: Self-pay

## 2023-09-26 NOTE — Telephone Encounter (Signed)
 Pt was notified of results

## 2023-09-26 NOTE — Telephone Encounter (Signed)
 Copied from CRM [id number]. Topic: Clinical - Medical Advice >> Sep 26, 2023  1:08 PM Donee H wrote: Reason for CRM: Patient stated really not feeling well. She mentioned has had a cough and sore throat for 2 weeks now. She was prescribed something last week but is stating is not helping. Patient not sure what is the name of medication but stated it was a clear pill. She states coughing non stop which gives her a headache. She also stated whole body hurts from coughing. Please follow up with patient at [phone]

## 2023-09-26 NOTE — Telephone Encounter (Signed)
 FYI Only or Action Required?: Action required by provider: update on patient condition and new med request.  Patient was last seen in primary care on 08/25/2023 by Emma Alm RAMAN, PA-C.  Called Nurse Triage reporting Cough and Sore Throat.  Symptoms began several weeks ago.  Interventions attempted: OTC medications: mucinex, lozenges, Prescription medications: Tessolon, and Rest, hydration, or home remedies.  Symptoms are: gradually worsening.  Triage Disposition: See Physician Within 24 Hours  Patient/caregiver understands and will follow disposition?: Yes   Copied from CRM [id number]. Topic: Clinical - Medical Advice >> Sep 26, 2023  1:08 PM Donee H wrote: Reason for CRM: Patient stated really not feeling well. She mentioned has had a cough and sore throat for 2 weeks now. She was prescribed something last week but is stating is not helping. Patient not sure what is the name of medication but stated it was a clear pill. She states coughing non stop which gives her a headache. She also stated whole body hurts from coughing. Please follow up with patient at [phone]  Reason for Disposition  SEVERE coughing spells (e.g., whooping sound after coughing, vomiting after coughing)  Answer Assessment - Initial Assessment Questions 1. ONSET: When did the cough begin?      Couple weeks 2. SEVERITY: How bad is the cough today?      Severe 3. SPUTUM: Describe the color of your sputum (none, dry cough; clear, white, yellow, green)     Dry cough 4. HEMOPTYSIS: Are you coughing up any blood? If so ask: How much? (flecks, streaks, tablespoons, etc.)     denies 5. DIFFICULTY BREATHING: Are you having difficulty breathing? If Yes, ask: How bad is it? (e.g., mild, moderate, severe)    - MILD: No SOB at rest, mild SOB with walking, speaks normally in sentences, can lie down, no retractions, pulse < 100.    - MODERATE: SOB at rest, SOB with minimal exertion and prefers to sit, cannot lie  down flat, speaks in phrases, mild retractions, audible wheezing, pulse 100-120.    - SEVERE: Very SOB at rest, speaks in single words, struggling to breathe, sitting hunched forward, retractions, pulse > 120       6. FEVER: Do you have a fever? If Yes, ask: What is your temperature, how was it measured, and when did it start?     denies  7. OTHER SYMPTOMS: Do you have any other symptoms? (e.g., runny nose, wheezing, chest pain)       Throat pain, headache from coughing.   Additional info: 1) Scheduled acute 09/27/23. Patient requesting medication to be called in today to help her cough throughout the night.  Protocols used: Cough - Acute Productive-A-AH

## 2023-09-26 NOTE — Telephone Encounter (Signed)
 See nurse triage encounter.

## 2023-09-27 ENCOUNTER — Ambulatory Visit: Admitting: Medical

## 2023-09-27 VITALS — BP 110/70 | HR 63 | Temp 97.7°F | Resp 16 | Wt 236.0 lb

## 2023-09-27 DIAGNOSIS — R058 Other specified cough: Secondary | ICD-10-CM | POA: Diagnosis not present

## 2023-09-27 DIAGNOSIS — J029 Acute pharyngitis, unspecified: Secondary | ICD-10-CM

## 2023-09-27 DIAGNOSIS — J988 Other specified respiratory disorders: Secondary | ICD-10-CM

## 2023-09-27 MED ORDER — AMOXICILLIN-POT CLAVULANATE 875-125 MG PO TABS: 1.0000 | ORAL_TABLET | Freq: Two times a day (BID) | ORAL | 0 refills | Status: DC

## 2023-09-27 MED ORDER — HYDROCODONE BIT-HOMATROP MBR 5-1.5 MG/5ML PO SOLN
5.0000 mL | Freq: Three times a day (TID) | ORAL | 0 refills | Status: AC | PRN
Start: 2023-09-27 — End: 2023-10-02

## 2023-09-27 MED ORDER — FLUCONAZOLE 150 MG PO TABS: 150.0000 mg | ORAL_TABLET | ORAL | 0 refills | Status: DC

## 2023-09-27 MED ORDER — PREDNISONE 20 MG PO TABS: ORAL_TABLET | ORAL | 0 refills | Status: DC

## 2023-09-27 NOTE — Progress Notes (Signed)
 Subjective:  Emma [name] is a 68 y.o. female who presents for Chief Complaint  Patient presents with   Cough    Cough x 3 weeks,tessalon  perles not working,       Here for ongoing cough and congestion.   Started 3 weeks ago with sore throat and drainage, but since then coughing head off, headache.  In last few days no fever, getting some purulent discharge from nose and cough.   Feels some wheezing and SOB.  No leg swelling.  No ear pain.  Has some nausea.   Decreased appetite.  No hemoptysis.  Using tessalon  Perles but they don't help.   Also using some mucinex, nyquil.  No other aggravating or relieving factors.    No other c/o.  Past Medical History:  Diagnosis Date   Chronic constipation    CKD (chronic kidney disease), stage III (HCC)    Cluster headache syndrome 04/27/2013   Colon polyp 06/11/2011   Most recent 04/23/08, no polyp--mild diverticulosis.  Repeat 10 yrs.   Depression with anxiety 02/22/2008   Qualifier: Diagnosis of  By: Bettie CMA (AAMA), Robin     Diabetes mellitus without complication (HCC)    Diverticulosis    GERD (gastroesophageal reflux disease) 06/11/2011   Hypertension    Recently diagnosed   Migraine syndrome    Osteopenia 11/2016   bone density. plan repeat in 11/2018   Overweight(278.02) 06/11/2011   PUD (peptic ulcer disease) 04/23/2008   Multiple antrum ulcers; h pylori neg   Rheumatoid arthritis(714.0)    Current Outpatient Medications on File Prior to Visit  Medication Sig Dispense Refill   acetaminophen  (TYLENOL ) 500 MG tablet Take 1,500 mg by mouth once as needed for mild pain or moderate pain.     ALPRAZolam  (XANAX ) 0.5 MG tablet TAKE 1 TABLET BY MOUTH EVERYDAY AT BEDTIME 30 tablet 1   AMBULATORY NON FORMULARY MEDICATION Diltiazem gel with 2% lidocaine  Apply a pea sized amount into your rectum three times daily until healed Dispense 30 GM zero refill 30 g 0   benzonatate  (TESSALON ) 200 MG capsule Take 1 capsule (200 mg total) by mouth  3 (three) times daily as needed for cough. 30 capsule 0   Blood Glucose Monitoring Suppl DEVI 1 each by Does not apply route daily. Patient to test 1-2 times daily. DX E11.9 1 each 0   busPIRone  (BUSPAR ) 7.5 MG tablet TAKE 1 TABLET BY MOUTH TWICE A DAY 180 tablet 1   calcium  carbonate ([facility] CALCIUM ) 1500 (600 Ca) MG TABS tablet Take 1 tablet (1,500 mg total) by mouth 2 (two) times daily with a meal. 180 tablet 2   gabapentin  (NEURONTIN ) 300 MG capsule Take 1 capsule (300 mg total) by mouth daily as needed.     Insulin  Glargine-Lixisenatide (SOLIQUA ) 100-33 UNT-MCG/ML SOPN Inject 20 Units into the skin daily. 15 mL 2   [name] 10 MG TABS Take 1 tablet by mouth daily.     methylcellulose (CITRUCEL) oral powder Take 1 packet by mouth daily.     Multiple Vitamins-Iron  (MULTIVITAMINS WITH IRON ) TABS tablet Take 1 tablet by mouth daily. 90 tablet 0   omeprazole  (PRILOSEC) 40 MG capsule Take 1 capsule (40 mg total) by mouth daily. 90 capsule 1   pravastatin  (PRAVACHOL ) 20 MG tablet Take 1 tablet (20 mg total) by mouth daily. 90 tablet 1   Rectal Protectant-Emollient (CALMOL-4) 76-10 % SUPP Use as directed once to twice daily 6 suppository 0   verapamil  (CALAN -SR) 240 MG CR  tablet TAKE 1 TABLET BY MOUTH EVERYDAY AT BEDTIME 90 tablet 1   Vitamin D , Ergocalciferol , (DRISDOL ) 1.25 MG (50000 UNIT) CAPS capsule Take 1 capsule (50,000 Units total) by mouth every 7 (seven) days. 12 capsule 3   EPINEPHrine  (EPIPEN  2-PAK) 0.3 mg/0.3 mL IJ SOAJ injection Inject 0.3 mg into the muscle as needed for anaphylaxis. 1 each 0   Glucose Blood (BLOOD GLUCOSE TEST STRIPS) STRP TEST 1-2 TIMES DAILY DX E11.9 100 strip 2   Insulin  Pen Needle (PEN NEEDLES) 31G X 8 MM MISC 1 each by Does not apply route daily. 90 each 3   No current facility-administered medications on file prior to visit.     The following portions of the patient's history were reviewed and updated as appropriate: allergies, current medications, past family  history, past medical history, past social history, past surgical history and problem list.  ROS Otherwise as in subjective above    Objective: BP 110/70   Pulse 63   Temp 97.7 F (36.5 C)   Resp 16   Wt 236 lb (107 kg)   SpO2 96%   BMI 36.96 kg/m   Wt Readings from Last 3 Encounters:  09/27/23 236 lb (107 kg)  08/25/23 246 lb 9.6 oz (111.9 kg)  06/29/23 243 lb 12.8 oz (110.6 kg)   General appearance: alert, no distress, well developed, well nourished HEENT: normocephalic, sclerae anicteric, conjunctiva pink and moist, TMs flat, nares patent, +mucoid discharge or erythema, pharynx with some post nasal drainage Oral cavity: MMM, no lesions Neck: supple, no lymphadenopathy, no thyromegaly, no masses Heart: RRR, normal S1, S2, no murmurs Lungs: coarse sounds, no wheezes, +rhonchi, or rales Pulses: 2+ radial pulses, 2+ pedal pulses, normal cap refill Ext: no edema    Assessment: Encounter Diagnoses  Name Primary?   Sore throat Yes   Productive cough    Respiratory tract infection      Plan: Advise rest, good hydration, stop NyQuil for now.  Begin prednisone  below, Augmentin  antibiotic below, Hycodan for cough as needed.  Caution with sedation Hycodan.  She requested Diflucan  in the event she gets a yeast infection secondary to antibiotic use.  If not much improved within the next 72 hours can call or recheck   [name] was seen today for cough.  Diagnoses and all orders for this visit:  Sore throat  Productive cough  Respiratory tract infection  Other orders -     HYDROcodone  bit-homatropine (HYCODAN) 5-1.5 MG/5ML syrup; Take 5 mLs by mouth every 8 (eight) hours as needed for up to 5 days for cough. -     amoxicillin -clavulanate (AUGMENTIN ) 875-125 MG tablet; Take 1 tablet by mouth 2 (two) times daily. -     fluconazole  (DIFLUCAN ) 150 MG tablet; Take 1 tablet (150 mg total) by mouth once a week. -     predniSONE  (DELTASONE ) 20 MG tablet; 3 tablets today, 2  tablets tomorrow, 1 tablet the third day    Follow up: prn

## 2023-10-20 ENCOUNTER — Other Ambulatory Visit: Payer: Self-pay | Admitting: Gastroenterology

## 2023-10-20 ENCOUNTER — Other Ambulatory Visit: Payer: Self-pay | Admitting: Medical

## 2023-10-20 NOTE — Telephone Encounter (Signed)
 This was discontinued by Vincenza Hews

## 2023-11-02 ENCOUNTER — Other Ambulatory Visit: Payer: Self-pay | Admitting: Medical

## 2023-11-11 ENCOUNTER — Other Ambulatory Visit: Payer: Self-pay | Admitting: Gastroenterology

## 2023-11-11 ENCOUNTER — Other Ambulatory Visit: Payer: Self-pay | Admitting: Medical

## 2023-11-14 ENCOUNTER — Other Ambulatory Visit: Payer: Self-pay | Admitting: Medical

## 2023-11-15 NOTE — Telephone Encounter (Signed)
 This was discontinued.

## 2023-11-28 ENCOUNTER — Other Ambulatory Visit: Payer: Self-pay | Admitting: Medical

## 2023-11-29 ENCOUNTER — Telehealth: Payer: Self-pay | Admitting: *Deleted

## 2023-11-29 NOTE — Telephone Encounter (Signed)
 This was discontinued.

## 2023-11-29 NOTE — Telephone Encounter (Signed)
 Copied from CRM [id number]. Topic: Referral - Question >> Nov 29, 2023  3:47 PM Charlet HERO wrote: Reason for CRM: Patient is calling asking Emma [name] to recommend her a Dr for her back she is stating that she was referred to previously are retired and gone. Please give the patient a call back.  Forwarding for recommendation.

## 2023-11-29 NOTE — Telephone Encounter (Signed)
 Copied from CRM [id number]. Topic: Clinical - Prescription Issue >> Nov 29, 2023  3:51 PM Charlet HERO wrote: Reason for CRM: patient is calling about refill for Request refused: Refill not appropriate  Sig: TAKE 1/2 OR 1 TABLET BY MOUTH AT BEDTIME mirtazapine  (REMERON ) 15 MG tablet  Please contact patient

## 2023-11-30 ENCOUNTER — Other Ambulatory Visit: Payer: Self-pay | Admitting: Medical

## 2023-11-30 NOTE — Telephone Encounter (Signed)
 The patient called in returning a call to [place]. I transferred the call for further assistance

## 2023-11-30 NOTE — Telephone Encounter (Signed)
Coming in tomorrow to discuss

## 2023-11-30 NOTE — Telephone Encounter (Signed)
 Left message for pt to call me back   (See other message too if patient calls back)

## 2023-11-30 NOTE — Telephone Encounter (Addendum)
 Per 06/2023 office note from [place]-  Insomnia-discontinue temazepam as it does not seem to be making a difference in her sleep. We discussed avoidance of multiple sedating medicines. Fortunately she has stopped Soma at some point    (See other message about another issue if she calls back)     Left message for pt to call me back

## 2023-11-30 NOTE — Telephone Encounter (Signed)
 Discussed this with patient. She does not want to continue this medication if it wasn't helping. Patient is taking Xanax  everynight for sleep. Pt is coming in tomorrow for med check to discuss back, diabetes and everything else

## 2023-12-01 ENCOUNTER — Ambulatory Visit: Admitting: Medical

## 2023-12-01 ENCOUNTER — Ambulatory Visit
Admission: RE | Admit: 2023-12-01 | Discharge: 2023-12-01 | Disposition: A | Discharge status: Home / Self Care | Source: Ambulatory Visit | Attending: Medical | Admitting: Medical

## 2023-12-01 VITALS — BP 110/70 | HR 64 | Wt 238.0 lb

## 2023-12-01 DIAGNOSIS — Z7185 Encounter for immunization safety counseling: Secondary | ICD-10-CM

## 2023-12-01 DIAGNOSIS — I10 Essential (primary) hypertension: Secondary | ICD-10-CM

## 2023-12-01 DIAGNOSIS — E559 Vitamin D deficiency, unspecified: Secondary | ICD-10-CM

## 2023-12-01 DIAGNOSIS — I872 Venous insufficiency (chronic) (peripheral): Secondary | ICD-10-CM

## 2023-12-01 DIAGNOSIS — G8929 Other chronic pain: Secondary | ICD-10-CM

## 2023-12-01 DIAGNOSIS — E118 Type 2 diabetes mellitus with unspecified complications: Secondary | ICD-10-CM

## 2023-12-01 DIAGNOSIS — N189 Chronic kidney disease, unspecified: Secondary | ICD-10-CM | POA: Insufficient documentation

## 2023-12-01 DIAGNOSIS — M609 Myositis, unspecified: Secondary | ICD-10-CM

## 2023-12-01 DIAGNOSIS — M545 Low back pain, unspecified: Secondary | ICD-10-CM

## 2023-12-01 DIAGNOSIS — E785 Hyperlipidemia, unspecified: Secondary | ICD-10-CM

## 2023-12-01 DIAGNOSIS — G4700 Insomnia, unspecified: Secondary | ICD-10-CM

## 2023-12-01 DIAGNOSIS — E1169 Type 2 diabetes mellitus with other specified complication: Secondary | ICD-10-CM

## 2023-12-01 MED ORDER — TIRZEPATIDE 7.5 MG/0.5ML ~~LOC~~ SOAJ: 7.5000 mg | SUBCUTANEOUS | 0 refills | Status: DC

## 2023-12-01 MED ORDER — DIAZEPAM 5 MG PO TABS
5.0000 mg | ORAL_TABLET | Freq: Every day | ORAL | 0 refills | Status: DC
Start: 2023-12-01 — End: 2023-12-06

## 2023-12-01 MED ORDER — TIRZEPATIDE 10 MG/0.5ML ~~LOC~~ SOAJ: 10.0000 mg | SUBCUTANEOUS | 0 refills | Status: DC

## 2023-12-01 NOTE — Patient Instructions (Addendum)
 Please go to [facility] for your lumbar xray.   Their hours are 8am - 4:30 pm Monday - Friday.  Take your insurance card with you.  [facility] [phone]  [address] [place], [place] [address]   See eye doctor soon!  Return for fasting labs  Begin Mounjaro  7.5mg  weekly.  While on the first month, ALSO use your Soliqua  but change down to 10 units daily  After 1 month, increase to Mounjaro  10mg  weekly and cut down to Soliqua  5 units daily

## 2023-12-01 NOTE — Progress Notes (Signed)
 Subjective: Chief Complaint  Patient presents with   Medical Management of Chronic Issues    Med check, discuss back referral   History of Present Illness Emma [name] is a 68 year old female  who presents for a medication check, chronic disease management.  She has diabetes and is currently on Soliqua  injection 20 units daily. Her last hemoglobin A1c in April was 7.9%, which was not at goal. Morning blood sugars are typically in the upper 90s, but can rise to 130 if she 'cheats on the weekend.'  She has chronic kidney disease and is under the care of a kidney specialist. She is on [name] alternating with [name] every other day.  Lisinipirl was d/c in recent moths [facility] nephrology.   Her last comprehensive metabolic panel in June 2025 showed a creatinine of 2.02 and an EGFR of 26.  She is on pravachol  20 mg daily for cholesterol. Her last cholesterol lab is past due, and she has been non-fasting at her last couple of visits. She is due for a cholesterol check.  She takes a vitamin D  supplement of 50,000 units weekly. Her last vitamin D  level in April was low.  She is on Verapamil  240 mg SR daily  She takes Buspar  7.5 mg twice a day for anxiety.  Her main concerns is back stiffness, ongoing chronic back pain.  She has had surgery on L4 and L5 with three surgeries in total. She experiences difficulty with physical activities, such as picking up her grandchild and walking, and uses various methods to manage her pain, including heating pads and cushions.  Uses gabapentin  typically BID.    Insomnia - uses xanax  at bedtime, without it struggles to stay asleep.  No other aggravating or relieving factors. No other complaint.   Past Medical History:  Diagnosis Date   Chronic constipation    CKD (chronic kidney disease), stage III (HCC)    Cluster headache syndrome 04/27/2013   Colon polyp 06/11/2011   Most recent 04/23/08, no polyp--mild diverticulosis.  Repeat 10 yrs.   Depression with  anxiety 02/22/2008   Qualifier: Diagnosis of  By: Bettie CMA (AAMA), Robin     Diabetes mellitus without complication (HCC)    Diverticulosis    GERD (gastroesophageal reflux disease) 06/11/2011   Hypertension    Recently diagnosed   Migraine syndrome    Osteopenia 11/2016   bone density. plan repeat in 11/2018   Overweight(278.02) 06/11/2011   PUD (peptic ulcer disease) 04/23/2008   Multiple antrum ulcers; h pylori neg   Rheumatoid arthritis(714.0)    Current Outpatient Medications on File Prior to Visit  Medication Sig Dispense Refill   acetaminophen  (TYLENOL ) 500 MG tablet Take 1,500 mg by mouth once as needed for mild pain or moderate pain.     ALPRAZolam  (XANAX ) 0.5 MG tablet TAKE 1 TABLET BY MOUTH EVERYDAY AT BEDTIME 30 tablet 1   busPIRone  (BUSPAR ) 7.5 MG tablet Take 1 tablet (7.5 mg total) by mouth 2 (two) times daily. Please schedule a follow up appointment for further refills. Thank you 60 tablet 1   calcium  carbonate ([facility] CALCIUM ) 1500 (600 Ca) MG TABS tablet Take 1 tablet (1,500 mg total) by mouth 2 (two) times daily with a meal. 180 tablet 2   gabapentin  (NEURONTIN ) 300 MG capsule TAKE 1 CAPSULE BY MOUTH THREE TIMES A DAY AS NEEDED 270 capsule 0   Insulin  Glargine-Lixisenatide (SOLIQUA ) 100-33 UNT-MCG/ML SOPN Inject 20 Units into the skin daily. 15 mL 2   [name] 10  MG TABS Take 1 tablet by mouth daily.     methylcellulose (CITRUCEL) oral powder Take 1 packet by mouth daily.     Multiple Vitamins-Iron  (MULTIVITAMINS WITH IRON ) TABS tablet Take 1 tablet by mouth daily. 90 tablet 0   omeprazole  (PRILOSEC) 40 MG capsule Take 1 capsule (40 mg total) by mouth daily. 90 capsule 1   pravastatin  (PRAVACHOL ) 20 MG tablet TAKE 1 TABLET BY MOUTH EVERY DAY 90 tablet 1   verapamil  (CALAN -SR) 240 MG CR tablet TAKE 1 TABLET BY MOUTH EVERYDAY AT BEDTIME 90 tablet 1   Vitamin D , Ergocalciferol , (DRISDOL ) 1.25 MG (50000 UNIT) CAPS capsule Take 1 capsule (50,000 Units total) by mouth every  7 (seven) days. 12 capsule 3   AMBULATORY NON FORMULARY MEDICATION Diltiazem gel with 2% lidocaine  Apply a pea sized amount into your rectum three times daily until healed Dispense 30 GM zero refill 30 g 0   Blood Glucose Monitoring Suppl DEVI 1 each by Does not apply route daily. Patient to test 1-2 times daily. DX E11.9 1 each 0   EPINEPHrine  (EPIPEN  2-PAK) 0.3 mg/0.3 mL IJ SOAJ injection Inject 0.3 mg into the muscle as needed for anaphylaxis. 1 each 0   Glucose Blood (BLOOD GLUCOSE TEST STRIPS) STRP TEST 1-2 TIMES DAILY DX E11.9 100 strip 2   Insulin  Pen Needle (PEN NEEDLES) 31G X 8 MM MISC 1 each by Does not apply route daily. 90 each 3   Rectal Protectant-Emollient (CALMOL-4) 76-10 % SUPP Use as directed once to twice daily (Patient not taking: Reported on 12/01/2023) 6 suppository 0   No current facility-administered medications on file prior to visit.    ROS as in subjective   Objective: BP 110/70   Pulse 64   Wt 238 lb (108 kg)   BMI 37.28 kg/m   Gen: wd, wn, nad Lungs clear Heart rrr, normal s1, s2, no murmurs Ext: 1+ bilat LE nonpitting edema Moderate spider veins feet, varicose veins bilat LE Neuro: A&Ox3 nonfocal exam    Assessment  Encounter Diagnoses  Name Primary?   Chronic bilateral low back pain, unspecified whether sciatica present Yes   Chronic venous insufficiency    Diabetes mellitus with complication (HCC)    Essential hypertension    Hyperlipidemia associated with type 2 diabetes mellitus (HCC)    Vitamin D  deficiency    Vaccine counseling    Statin-induced myositis    Insomnia, unspecified type    Chronic kidney disease, unspecified CKD stage      Plan: Type 2 diabetes mellitus Type 2 diabetes mellitus with last hemoglobin A1c of 7.9% in April, not at goal. Morning blood sugars generally well-controlled, ranging from upper 90s to 130s. Currently on Soliqua  20 units daily. - Order hemoglobin A1c test. - Send prescription for Mounjaro  7.5 mg  and 10 mg to pharmacy. - If Mounjaro  is covered, transition to Mounjaro  7.5 mg weekly and reduce Soliqua  to 15 units daily for the first month.  After 1 month, increase to Mounjaro  10mg  and stop Soliqua  -f/u 1-2 months  Chronic kidney disease Chronic kidney disease with creatinine of 2.02 and eGFR of 26 as of June 2025. Managed by a kidney specialist with current medications including [name] and [name] alternating QOD. Doses were decreased due to low blood pressure and worsening creatinine. - Continue current management with kidney specialist.  Chronic back pain Chronic back pain with lumbar spine surgery at L4 and L5. Pain management includes gabapentin  300 mg three times a day. -  Order lumbar spine x-ray. - Consider referral to [facility] for further evaluation if x-ray indicates need for intervention. - Discuss potential transition to Mounjaro  for weight loss benefits.  Hyperlipidemia Hyperlipidemia managed with pravastatin  20 mg daily. Last cholesterol lab is overdue, and previous labs were non-fasting. - Order fasting lipid panel. - Encourage fasting before next cholesterol test.  Hypertension Hypertension managed with verapamil  240 mg SR daily.  Anxiety disorder Anxiety disorder managed with Buspar  7.5 mg twice a day.  Vitamin D  deficiency Vitamin D  deficiency with last level low in April. Managed with vitamin D  supplement 50,000 units weekly. - Order vitamin D  level test.  Insomnia -begin trial of Diazepam  5mg  at bedtime instead of xanax  that she has been relying on   Raysha was seen today for medical management of chronic issues.  Diagnoses and all orders for this visit:  Chronic bilateral low back pain, unspecified whether sciatica present -     DG Lumbar Spine Complete; Future  Chronic venous insufficiency  Diabetes mellitus with complication (HCC) -     Hemoglobin A1c; Future  Essential hypertension  Hyperlipidemia associated with type 2 diabetes  mellitus (HCC) -     Lipid panel; Future  Vitamin D  deficiency -     VITAMIN D  25 Hydroxy (Vit-D Deficiency, Fractures); Future  Vaccine counseling  Statin-induced myositis  Insomnia, unspecified type  Chronic kidney disease, unspecified CKD stage  Other orders -     tirzepatide  (MOUNJARO ) 7.5 MG/0.5ML Pen; Inject 7.5 mg into the skin once a week. -     tirzepatide  (MOUNJARO ) 10 MG/0.5ML Pen; Inject 10 mg into the skin once a week. -     diazepam  (VALIUM ) 5 MG tablet; Take 1 tablet (5 mg total) by mouth at bedtime.  Spent > 45 minutes face to face with patient in discussion of symptoms, evaluation, plan and recommendations.    F/u pending labs, xray

## 2023-12-02 ENCOUNTER — Telehealth: Payer: Self-pay

## 2023-12-02 ENCOUNTER — Other Ambulatory Visit (HOSPITAL_COMMUNITY): Payer: Self-pay

## 2023-12-02 NOTE — Telephone Encounter (Signed)
 Pharmacy Patient Advocate Encounter   Received notification from [facility] MEDICARE PARTD that prior authorization for Diazepam  5mg is required/requested.   Insurance verification completed.   The patient is insured through [facility] MEDICARE .   Per test claim: PA required; PA submitted to above mentioned insurance via Latent Key/confirmation #/EOC [id number] Status is pending

## 2023-12-05 ENCOUNTER — Other Ambulatory Visit (HOSPITAL_COMMUNITY): Payer: Self-pay

## 2023-12-05 NOTE — Telephone Encounter (Signed)
 Pharmacy Patient Advocate Encounter  Received notification from [facility] MEDICARE that Prior Authorization for diazePAM  5MG  tablets  has been DENIED.  Full denial letter will be uploaded to the media tab. See denial reason below.  Please see the Denial from Medicare as they will not cover for off label use.    PA #/Case ID/Reference #: [id number]

## 2023-12-06 ENCOUNTER — Ambulatory Visit: Payer: Self-pay

## 2023-12-06 ENCOUNTER — Other Ambulatory Visit (HOSPITAL_COMMUNITY): Payer: Self-pay

## 2023-12-06 ENCOUNTER — Telehealth: Payer: Self-pay

## 2023-12-06 ENCOUNTER — Other Ambulatory Visit: Payer: Self-pay | Admitting: Medical

## 2023-12-06 MED ORDER — BELSOMRA 10 MG PO TABS: 10.0000 mg | ORAL_TABLET | Freq: Every day | ORAL | 2 refills | Status: DC

## 2023-12-06 NOTE — Telephone Encounter (Signed)
 PA for [facility] needed

## 2023-12-06 NOTE — Telephone Encounter (Signed)
 Patient was notified that mounjaro  was sent in. Pt waiting on PA for mounjaro  now

## 2023-12-06 NOTE — Telephone Encounter (Signed)
 Pharmacy Patient Advocate Encounter   Received notification from Onbase that prior authorization for tirzepatide  (MOUNJARO ) 7.5 MG/0.5ML Pen  is required/requested.   Insurance verification completed.   The patient is insured through [facility] MEDICARE .   Per test claim: PA required; PA submitted to above mentioned insurance via Latent Key/confirmation #/EOC [facility] Status is pending

## 2023-12-06 NOTE — Telephone Encounter (Signed)
 Patient states that her insurance doesn't have anything about mounjaro  and I do not see med has been sent in. Please send in

## 2023-12-06 NOTE — Telephone Encounter (Signed)
 FYI Only or Action Required?: FYI only for provider.  Patient was last seen in primary care on 12/01/2023 by Bulah Alm RAMAN, PA-C.  Called Nurse Triage reporting Advice Only.  Symptoms began today.   Triage Disposition: No disposition on file.  Patient/caregiver understands and will follow disposition?: Yes     Answer Assessment - Initial Assessment Questions 1. REASON FOR CALL: What is the main reason for your call? or How can I best help you?    Pt called and informed her insurance will not pay for the diazepam  therefore if PCP could write for alternative medication.  Pt also stated she would like to follow up on Mounjario - pt thought PCP was going to write for this but nothing at pharmacy at present time.  Please call pt.  Protocols used: Information Only Call - No Triage-A-AH

## 2023-12-06 NOTE — Telephone Encounter (Signed)
 Pt was notified.

## 2023-12-07 ENCOUNTER — Other Ambulatory Visit: Payer: Self-pay | Admitting: Gastroenterology

## 2023-12-07 ENCOUNTER — Other Ambulatory Visit: Payer: Self-pay | Admitting: Internal Medicine

## 2023-12-07 ENCOUNTER — Other Ambulatory Visit: Payer: Self-pay | Admitting: Medical

## 2023-12-07 ENCOUNTER — Telehealth: Payer: Self-pay | Admitting: Internal Medicine

## 2023-12-07 ENCOUNTER — Ambulatory Visit: Payer: Self-pay | Admitting: Medical

## 2023-12-07 ENCOUNTER — Other Ambulatory Visit (HOSPITAL_COMMUNITY): Payer: Self-pay

## 2023-12-07 DIAGNOSIS — R9389 Abnormal findings on diagnostic imaging of other specified body structures: Secondary | ICD-10-CM

## 2023-12-07 NOTE — Telephone Encounter (Signed)
 Patient is no longer on this medication and knows this. She is not sure why it keeps coming up as a renewal

## 2023-12-07 NOTE — Telephone Encounter (Signed)
 Pharmacy Patient Advocate Encounter  Received notification from [facility] that Prior Authorization for Mounjaro  7.5MG /0.5ML auto-injectors has been APPROVED from 12/06/2023 to further notice. Unable to obtain price due to refill too soon rejection, last fill date 12/07/2023 next available fill date10/09/2023.   PA #/Case ID/Reference #: [id number]

## 2023-12-07 NOTE — Progress Notes (Signed)
 Back x-ray shows multiple areas of degenerative changes which is not terribly unusual  If agreeable, we can refer to orthopedic/spine specialist.    Emma [name] has an office in [place] [address] of [address]  This may be a good option for her for evaluation and recommendations on how to manage her back pains  If agreeable lets refer

## 2023-12-07 NOTE — Telephone Encounter (Signed)
 Patient was notified.   Copied from CRM [id number]. Topic: Clinical - Medication Question >> Dec 07, 2023  2:41 PM Roselie BROCKS wrote: Reason for CRM: Patients insurance approved the Mounjaro   7.5, and patient needs to know if she should still take both different types of shots, or if should stop the old one and just use the new one, or what to do, and requesting a return call., Patient has already taking the 20 units, older shot and needs to know when to start the new Monjaro, and time frame daily.  Can leave detailed information on voicemail if needed.

## 2024-01-10 ENCOUNTER — Telehealth: Payer: Self-pay | Admitting: Internal Medicine

## 2024-01-10 ENCOUNTER — Other Ambulatory Visit: Payer: Self-pay | Admitting: Medical

## 2024-01-10 MED ORDER — ONDANSETRON HCL 4 MG PO TABS: 4.0000 mg | ORAL_TABLET | Freq: Every day | ORAL | 1 refills | Status: DC | PRN

## 2024-01-10 NOTE — Telephone Encounter (Signed)
 Copied from CRM [id number]. Topic: Clinical - Medical Advice >> Jan 10, 2024  1:05 PM Gustabo D wrote: Pt would like a call back form Natalyn Szymanowski Pt is taking-tirzepatide  (MOUNJARO ) 7.5 MG/0.5ML Pen and it's causing a upset stomach and asking if zopram for nausea

## 2024-01-13 ENCOUNTER — Other Ambulatory Visit: Payer: Self-pay | Admitting: Medical

## 2024-01-13 ENCOUNTER — Telehealth: Payer: Self-pay | Admitting: Medical

## 2024-01-13 MED ORDER — DIAZEPAM 5 MG PO TABS: 5.0000 mg | ORAL_TABLET | Freq: Every day | ORAL | 2 refills | Status: DC

## 2024-01-13 NOTE — Telephone Encounter (Unsigned)
 Copied from CRM [id number]. Topic: Clinical - Medication Refill >> Jan 13, 2024 11:32 AM Kevelyn M wrote: Medication: diazepam  (VALIUM ) 5 MG tablet  Has the patient contacted their pharmacy? No (Agent: If no, request that the patient contact the pharmacy for the refill. If patient does not wish to contact the pharmacy document the reason why and proceed with request.) (Agent: If yes, when and what did the pharmacy advise?)  This is the patient's preferred pharmacy:  [id number] - [place], [place] - [address] [place] [address] Phone: [phone] Fax: [phone]  Is this the correct pharmacy for this prescription? Yes If no, delete pharmacy and type the correct one.   Has the prescription been filled recently? No  Is the patient out of the medication? Yes  Has the patient been seen for an appointment in the last year OR does the patient have an upcoming appointment? Yes  Can we respond through MyChart? No  Agent: Please be advised that Rx refills may take up to 3 business days. We ask that you follow-up with your pharmacy.

## 2024-01-13 NOTE — Telephone Encounter (Signed)
 Request for Diazepam , not on profile. Prescribed 12/01/23 in place of xanax  for sleep.

## 2024-01-19 ENCOUNTER — Other Ambulatory Visit (HOSPITAL_COMMUNITY): Payer: Self-pay

## 2024-01-20 ENCOUNTER — Other Ambulatory Visit: Payer: Self-pay | Admitting: Medical

## 2024-01-23 ENCOUNTER — Other Ambulatory Visit: Payer: Self-pay | Admitting: Medical

## 2024-02-27 ENCOUNTER — Telehealth: Payer: Self-pay | Admitting: Internal Medicine

## 2024-02-27 ENCOUNTER — Other Ambulatory Visit: Payer: Self-pay | Admitting: Medical

## 2024-02-27 MED ORDER — MOUNJARO 12.5 MG/0.5ML ~~LOC~~ SOAJ: 12.5000 mg | SUBCUTANEOUS | 0 refills | Status: DC

## 2024-02-27 NOTE — Telephone Encounter (Signed)
 Left message for pt to call back to schedule a visit for January but medication was sent in

## 2024-02-27 NOTE — Telephone Encounter (Signed)
 Copied from CRM #[id number]. Topic: Clinical - Medication Question >> Feb 27, 2024  2:05 PM Rachelle R wrote: Reason for CRM: Patient is supposed to get a new prescription for tirzepatide  (MOUNJARO ) 10 MG/0.5ML Pen. Is requesting to see if the amount can be increased for whatever follows the 10MG .  Patient can be reached at [phone]

## 2024-03-05 ENCOUNTER — Telehealth: Payer: Self-pay | Admitting: Pharmacy Technician

## 2024-03-05 ENCOUNTER — Other Ambulatory Visit (HOSPITAL_COMMUNITY): Payer: Self-pay

## 2024-03-05 NOTE — Telephone Encounter (Addendum)
 Pharmacy Patient Advocate Encounter   Received notification from Onbase/CMM that prior authorization for Diazepam  5mg  is required/requested.  Looks like she filled the Alprazolam  last. Should I continue with the PA for Diazepam  or is she switching back to Alprazolam ?    Insurance verification completed.   The patient is insured through [facility] MEDICAID.   Per test claim: PA required; PA started via CoverMyMeds. KEY [id number] . Please see clinical question(s) below that I am not finding the answer to in their chart and advise. Please select the member's diagnosis that this medication is primarily being prescribed for: Anxiety disorder Short-term relief of anxiety Skeletal muscle spasm Alcohol withdrawal syndrome Seizure None of the above (please provide clinical rationale for this request, then attach and submit supporting chart notes/labs, clinical trials, and practice guidelines to the request)

## 2024-03-06 ENCOUNTER — Other Ambulatory Visit: Payer: Self-pay | Admitting: Internal Medicine

## 2024-03-06 NOTE — Telephone Encounter (Signed)
 The patient called in returning a call to Sabrina. Please contact the patient back regarding the providers message.

## 2024-03-06 NOTE — Telephone Encounter (Signed)
 Pt states that she is alternating nights with valium  and xanax . She said she knows insurance won't pay for the Valium  but somehow she gets a 30 days supply of valium  for $14. So she has just rotating each night. She uses this for sleep only. Please advise if that is ok or she needs to stick with just one of them nightly.  She will schedule a visit after the new year

## 2024-03-06 NOTE — Telephone Encounter (Signed)
 Left message for pt to call back

## 2024-03-07 ENCOUNTER — Other Ambulatory Visit

## 2024-03-07 NOTE — Telephone Encounter (Signed)
 Left detailed message about going with valium  if affordable for now but don't do both

## 2024-03-09 ENCOUNTER — Other Ambulatory Visit: Payer: Self-pay | Admitting: Medical

## 2024-03-25 ENCOUNTER — Other Ambulatory Visit: Payer: Self-pay | Admitting: Medical

## 2024-04-18 ENCOUNTER — Other Ambulatory Visit: Payer: Self-pay | Admitting: Medical

## 2024-04-23 ENCOUNTER — Ambulatory Visit: Admitting: Medical

## 2024-04-25 ENCOUNTER — Other Ambulatory Visit: Payer: Self-pay | Admitting: Medical

## 2024-04-27 ENCOUNTER — Ambulatory Visit: Admitting: Medical

## 2024-04-27 VITALS — BP 120/70 | HR 68 | Wt 233.2 lb

## 2024-04-27 DIAGNOSIS — K219 Gastro-esophageal reflux disease without esophagitis: Secondary | ICD-10-CM

## 2024-04-27 DIAGNOSIS — M8580 Other specified disorders of bone density and structure, unspecified site: Secondary | ICD-10-CM

## 2024-04-27 DIAGNOSIS — R809 Proteinuria, unspecified: Secondary | ICD-10-CM

## 2024-04-27 DIAGNOSIS — E559 Vitamin D deficiency, unspecified: Secondary | ICD-10-CM

## 2024-04-27 DIAGNOSIS — N1832 Chronic kidney disease, stage 3b: Secondary | ICD-10-CM

## 2024-04-27 DIAGNOSIS — D649 Anemia, unspecified: Secondary | ICD-10-CM

## 2024-04-27 DIAGNOSIS — M79604 Pain in right leg: Secondary | ICD-10-CM

## 2024-04-27 DIAGNOSIS — Z7185 Encounter for immunization safety counseling: Secondary | ICD-10-CM

## 2024-04-27 DIAGNOSIS — Z8669 Personal history of other diseases of the nervous system and sense organs: Secondary | ICD-10-CM

## 2024-04-27 DIAGNOSIS — F5104 Psychophysiologic insomnia: Secondary | ICD-10-CM

## 2024-04-27 DIAGNOSIS — Z780 Asymptomatic menopausal state: Secondary | ICD-10-CM

## 2024-04-27 DIAGNOSIS — G4700 Insomnia, unspecified: Secondary | ICD-10-CM

## 2024-04-27 DIAGNOSIS — I10 Essential (primary) hypertension: Secondary | ICD-10-CM

## 2024-04-27 DIAGNOSIS — E1169 Type 2 diabetes mellitus with other specified complication: Secondary | ICD-10-CM

## 2024-04-27 DIAGNOSIS — Z79899 Other long term (current) drug therapy: Secondary | ICD-10-CM

## 2024-04-27 DIAGNOSIS — T466X5A Adverse effect of antihyperlipidemic and antiarteriosclerotic drugs, initial encounter: Secondary | ICD-10-CM

## 2024-04-27 DIAGNOSIS — E118 Type 2 diabetes mellitus with unspecified complications: Secondary | ICD-10-CM

## 2024-04-27 MED ORDER — DIAZEPAM 5 MG PO TABS: 5.0000 mg | ORAL_TABLET | Freq: Every day | ORAL | 2 refills | Status: AC

## 2024-04-27 MED ORDER — EPINEPHRINE 0.3 MG/0.3ML IJ SOAJ: 0.3000 mg | INTRAMUSCULAR | 0 refills | Status: AC | PRN

## 2024-04-27 MED ORDER — ONDANSETRON HCL 4 MG PO TABS: 4.0000 mg | ORAL_TABLET | Freq: Once | ORAL | 0 refills | Status: AC

## 2024-04-27 MED ORDER — GABAPENTIN 300 MG PO CAPS: 300.0000 mg | ORAL_CAPSULE | Freq: Every day | ORAL | Status: AC

## 2024-04-27 MED ORDER — VERAPAMIL HCL ER 240 MG PO TBCR: EXTENDED_RELEASE_TABLET | ORAL | 0 refills | Status: AC

## 2024-04-27 MED ORDER — VITAMIN D (ERGOCALCIFEROL) 1.25 MG (50000 UNIT) PO CAPS: 50000.0000 [IU] | ORAL_CAPSULE | ORAL | 0 refills | Status: AC

## 2024-04-27 MED ORDER — PRAVASTATIN SODIUM 20 MG PO TABS: 20.0000 mg | ORAL_TABLET | Freq: Every day | ORAL | 0 refills | Status: AC

## 2024-04-27 MED ORDER — MOUNJARO 15 MG/0.5ML ~~LOC~~ SOAJ: 15.0000 mg | SUBCUTANEOUS | 2 refills | Status: AC

## 2024-04-27 MED ORDER — OMEPRAZOLE 40 MG PO CPDR: 40.0000 mg | DELAYED_RELEASE_CAPSULE | Freq: Every day | ORAL | 0 refills | Status: AC
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
## 2024-10-30 ENCOUNTER — Encounter: Admitting: Medical
